# Patient Record
Sex: Female | Born: 1960 | Race: White | Hispanic: No | Smoking: Former smoker
Health system: Southern US, Community
[De-identification: ages and names within clinical notes are randomized; demographics above are authoritative.]

## PROBLEM LIST (undated history)

## (undated) DIAGNOSIS — I10 Essential (primary) hypertension: Secondary | ICD-10-CM

## (undated) DIAGNOSIS — R569 Unspecified convulsions: Secondary | ICD-10-CM

## (undated) DIAGNOSIS — M545 Low back pain, unspecified: Secondary | ICD-10-CM

## (undated) DIAGNOSIS — R51 Headache: Secondary | ICD-10-CM

## (undated) DIAGNOSIS — F419 Anxiety disorder, unspecified: Secondary | ICD-10-CM

## (undated) DIAGNOSIS — R519 Headache, unspecified: Secondary | ICD-10-CM

## (undated) DIAGNOSIS — D649 Anemia, unspecified: Secondary | ICD-10-CM

## (undated) HISTORY — PX: MANDIBLE SURGERY: SHX707

## (undated) HISTORY — DX: Low back pain: M54.5

## (undated) HISTORY — DX: Anxiety disorder, unspecified: F41.9

## (undated) HISTORY — DX: Headache: R51

## (undated) HISTORY — DX: Unspecified convulsions: R56.9

## (undated) HISTORY — PX: BRAIN SURGERY: SHX531

## (undated) HISTORY — DX: Essential (primary) hypertension: I10

## (undated) HISTORY — DX: Headache, unspecified: R51.9

## (undated) HISTORY — DX: Low back pain, unspecified: M54.50

## (undated) HISTORY — DX: Anemia, unspecified: D64.9

## (undated) HISTORY — PX: JOINT REPLACEMENT: SHX530

---

## 2002-04-03 ENCOUNTER — Emergency Department (HOSPITAL_COMMUNITY): Admission: EM | Admit: 2002-04-03 | Discharge: 2002-04-03 | Payer: Self-pay | Admitting: *Deleted

## 2002-04-04 ENCOUNTER — Encounter: Payer: Self-pay | Admitting: Emergency Medicine

## 2002-04-04 ENCOUNTER — Emergency Department (HOSPITAL_COMMUNITY): Admission: EM | Admit: 2002-04-04 | Discharge: 2002-04-04 | Payer: Self-pay | Admitting: Emergency Medicine

## 2003-12-10 ENCOUNTER — Emergency Department (HOSPITAL_COMMUNITY): Admission: EM | Admit: 2003-12-10 | Discharge: 2003-12-10 | Payer: Self-pay | Admitting: Emergency Medicine

## 2004-10-16 ENCOUNTER — Emergency Department: Payer: Self-pay | Admitting: Unknown Physician Specialty

## 2005-03-03 ENCOUNTER — Emergency Department (HOSPITAL_COMMUNITY): Admission: EM | Admit: 2005-03-03 | Discharge: 2005-03-03 | Payer: Self-pay | Admitting: Emergency Medicine

## 2005-09-29 ENCOUNTER — Emergency Department: Payer: Self-pay | Admitting: Emergency Medicine

## 2005-12-08 ENCOUNTER — Emergency Department (HOSPITAL_COMMUNITY): Admission: EM | Admit: 2005-12-08 | Discharge: 2005-12-08 | Payer: Self-pay | Admitting: Emergency Medicine

## 2006-03-26 ENCOUNTER — Emergency Department: Payer: Self-pay | Admitting: Emergency Medicine

## 2006-04-25 ENCOUNTER — Emergency Department: Payer: Self-pay | Admitting: Unknown Physician Specialty

## 2006-06-12 ENCOUNTER — Encounter: Payer: Self-pay | Admitting: Emergency Medicine

## 2006-06-12 ENCOUNTER — Inpatient Hospital Stay (HOSPITAL_COMMUNITY): Admission: AD | Admit: 2006-06-12 | Discharge: 2006-06-22 | Payer: Self-pay | Admitting: Neurosurgery

## 2006-06-22 ENCOUNTER — Inpatient Hospital Stay (HOSPITAL_COMMUNITY)
Admission: RE | Admit: 2006-06-22 | Discharge: 2006-07-04 | Payer: Self-pay | Admitting: Physical Medicine & Rehabilitation

## 2006-06-23 ENCOUNTER — Ambulatory Visit: Payer: Self-pay | Admitting: Physical Medicine & Rehabilitation

## 2006-07-11 ENCOUNTER — Encounter: Payer: Self-pay | Admitting: Physical Medicine & Rehabilitation

## 2006-07-13 ENCOUNTER — Inpatient Hospital Stay (HOSPITAL_COMMUNITY): Admission: RE | Admit: 2006-07-13 | Discharge: 2006-07-16 | Payer: Self-pay | Admitting: Neurosurgery

## 2009-09-08 ENCOUNTER — Ambulatory Visit: Payer: Self-pay | Admitting: Pain Medicine

## 2009-09-14 ENCOUNTER — Ambulatory Visit: Payer: Self-pay | Admitting: Pain Medicine

## 2009-09-21 ENCOUNTER — Emergency Department: Payer: Self-pay | Admitting: Emergency Medicine

## 2009-10-19 ENCOUNTER — Emergency Department: Payer: Self-pay | Admitting: Emergency Medicine

## 2009-10-21 ENCOUNTER — Ambulatory Visit: Payer: Self-pay | Admitting: Pain Medicine

## 2009-11-09 ENCOUNTER — Ambulatory Visit: Payer: Self-pay | Admitting: Unknown Physician Specialty

## 2009-11-26 ENCOUNTER — Ambulatory Visit: Payer: Self-pay | Admitting: Pain Medicine

## 2009-12-07 ENCOUNTER — Ambulatory Visit: Payer: Self-pay | Admitting: Pain Medicine

## 2009-12-30 ENCOUNTER — Ambulatory Visit: Payer: Self-pay | Admitting: Pain Medicine

## 2010-01-06 ENCOUNTER — Ambulatory Visit: Payer: Self-pay | Admitting: Pain Medicine

## 2010-02-02 ENCOUNTER — Ambulatory Visit: Payer: Self-pay | Admitting: Pain Medicine

## 2010-03-03 ENCOUNTER — Ambulatory Visit: Payer: Self-pay | Admitting: Pain Medicine

## 2010-03-31 ENCOUNTER — Ambulatory Visit: Payer: Self-pay | Admitting: Pain Medicine

## 2010-04-22 ENCOUNTER — Ambulatory Visit: Payer: Self-pay | Admitting: Pain Medicine

## 2010-04-28 ENCOUNTER — Ambulatory Visit: Payer: Self-pay | Admitting: Pain Medicine

## 2010-05-27 ENCOUNTER — Ambulatory Visit: Payer: Self-pay | Admitting: Pain Medicine

## 2010-06-24 ENCOUNTER — Ambulatory Visit: Payer: Self-pay | Admitting: Pain Medicine

## 2010-07-16 ENCOUNTER — Ambulatory Visit: Payer: Self-pay | Admitting: Neurosurgery

## 2010-07-21 ENCOUNTER — Ambulatory Visit: Payer: Self-pay | Admitting: Pain Medicine

## 2010-07-28 ENCOUNTER — Ambulatory Visit: Payer: Self-pay | Admitting: Pain Medicine

## 2010-07-30 ENCOUNTER — Ambulatory Visit: Payer: Self-pay | Admitting: Pain Medicine

## 2010-08-24 ENCOUNTER — Ambulatory Visit: Payer: Self-pay | Admitting: Pain Medicine

## 2010-09-13 ENCOUNTER — Ambulatory Visit: Payer: Self-pay | Admitting: Pain Medicine

## 2010-10-12 ENCOUNTER — Ambulatory Visit: Payer: Self-pay | Admitting: Pain Medicine

## 2010-11-09 ENCOUNTER — Ambulatory Visit: Payer: Self-pay | Admitting: Pain Medicine

## 2010-12-09 ENCOUNTER — Ambulatory Visit: Payer: Self-pay | Admitting: Pain Medicine

## 2011-01-06 ENCOUNTER — Ambulatory Visit: Payer: Self-pay | Admitting: Pain Medicine

## 2011-01-10 ENCOUNTER — Ambulatory Visit: Payer: Self-pay | Admitting: Unknown Physician Specialty

## 2011-01-20 ENCOUNTER — Inpatient Hospital Stay: Payer: Self-pay | Admitting: Unknown Physician Specialty

## 2011-01-21 LAB — PATHOLOGY REPORT

## 2011-09-23 ENCOUNTER — Ambulatory Visit: Payer: Self-pay | Admitting: Unknown Physician Specialty

## 2011-10-07 ENCOUNTER — Inpatient Hospital Stay: Payer: Self-pay | Admitting: Unknown Physician Specialty

## 2011-10-08 DIAGNOSIS — I499 Cardiac arrhythmia, unspecified: Secondary | ICD-10-CM

## 2011-10-12 LAB — PATHOLOGY REPORT

## 2012-03-22 ENCOUNTER — Ambulatory Visit: Payer: Self-pay | Admitting: Pain Medicine

## 2012-04-24 ENCOUNTER — Ambulatory Visit: Payer: Self-pay | Admitting: Pain Medicine

## 2012-05-17 ENCOUNTER — Ambulatory Visit: Payer: Self-pay | Admitting: Pain Medicine

## 2012-06-19 ENCOUNTER — Ambulatory Visit: Payer: Self-pay | Admitting: Pain Medicine

## 2012-07-08 ENCOUNTER — Emergency Department: Payer: Self-pay | Admitting: Emergency Medicine

## 2012-07-08 LAB — COMPREHENSIVE METABOLIC PANEL
Albumin: 4.2 g/dL (ref 3.4–5.0)
Alkaline Phosphatase: 113 U/L (ref 50–136)
Calcium, Total: 9.1 mg/dL (ref 8.5–10.1)
Co2: 32 mmol/L (ref 21–32)
EGFR (Non-African Amer.): >60
Glucose: 104 mg/dL — ABNORMAL HIGH (ref 65–99)
Osmolality: 275 (ref 275–301)
SGOT(AST): 32 U/L (ref 15–37)
SGPT (ALT): 29 U/L (ref 12–78)

## 2012-07-08 LAB — CBC
MCH: 31.5 pg (ref 26.0–34.0)
Platelet: 202 10*3/uL (ref 150–440)
RBC: 4.31 10*6/uL (ref 3.80–5.20)
RDW: 12.6 % (ref 11.5–14.5)
WBC: 9.1 10*3/uL (ref 3.6–11.0)

## 2012-07-08 LAB — PROTIME-INR: INR: 1

## 2012-07-08 LAB — MAGNESIUM: Magnesium: 1.6 mg/dL — ABNORMAL LOW

## 2012-09-18 ENCOUNTER — Ambulatory Visit: Payer: Self-pay | Admitting: Pain Medicine

## 2012-10-18 ENCOUNTER — Ambulatory Visit: Payer: Self-pay | Admitting: Pain Medicine

## 2012-11-13 ENCOUNTER — Ambulatory Visit: Payer: Self-pay | Admitting: Pain Medicine

## 2012-12-13 ENCOUNTER — Ambulatory Visit: Payer: Self-pay | Admitting: Pain Medicine

## 2013-01-15 ENCOUNTER — Ambulatory Visit: Payer: Self-pay | Admitting: Pain Medicine

## 2013-02-14 ENCOUNTER — Ambulatory Visit: Payer: Self-pay | Admitting: Pain Medicine

## 2013-03-18 ENCOUNTER — Ambulatory Visit: Payer: Self-pay | Admitting: Pain Medicine

## 2013-04-16 ENCOUNTER — Ambulatory Visit: Payer: Self-pay | Admitting: Pain Medicine

## 2013-05-16 ENCOUNTER — Ambulatory Visit: Payer: Self-pay | Admitting: Pain Medicine

## 2013-06-11 ENCOUNTER — Ambulatory Visit: Payer: Self-pay | Admitting: Pain Medicine

## 2013-07-11 ENCOUNTER — Ambulatory Visit: Payer: Self-pay | Admitting: Pain Medicine

## 2013-08-13 ENCOUNTER — Ambulatory Visit: Payer: Self-pay | Admitting: Pain Medicine

## 2013-09-10 ENCOUNTER — Ambulatory Visit: Payer: Self-pay | Admitting: Pain Medicine

## 2013-10-15 ENCOUNTER — Ambulatory Visit: Payer: Self-pay | Admitting: Pain Medicine

## 2013-11-14 ENCOUNTER — Ambulatory Visit: Payer: Self-pay | Admitting: Pain Medicine

## 2013-12-10 ENCOUNTER — Ambulatory Visit: Payer: Self-pay | Admitting: Pain Medicine

## 2014-01-06 ENCOUNTER — Ambulatory Visit: Payer: Self-pay | Admitting: Pain Medicine

## 2014-02-11 ENCOUNTER — Ambulatory Visit: Payer: Self-pay | Admitting: Pain Medicine

## 2014-03-13 ENCOUNTER — Ambulatory Visit: Payer: Self-pay | Admitting: Pain Medicine

## 2014-04-15 ENCOUNTER — Ambulatory Visit: Payer: Self-pay | Admitting: Pain Medicine

## 2014-05-13 ENCOUNTER — Ambulatory Visit: Payer: Self-pay | Admitting: Pain Medicine

## 2014-06-12 ENCOUNTER — Ambulatory Visit: Payer: Self-pay | Admitting: Pain Medicine

## 2014-07-15 ENCOUNTER — Ambulatory Visit: Payer: Self-pay | Admitting: Pain Medicine

## 2014-08-14 ENCOUNTER — Ambulatory Visit: Payer: Self-pay | Admitting: Pain Medicine

## 2014-09-11 ENCOUNTER — Ambulatory Visit: Payer: Self-pay | Admitting: Pain Medicine

## 2014-10-14 ENCOUNTER — Ambulatory Visit: Payer: Self-pay | Admitting: Pain Medicine

## 2014-11-11 ENCOUNTER — Ambulatory Visit: Payer: Self-pay | Admitting: Pain Medicine

## 2014-12-11 ENCOUNTER — Ambulatory Visit: Payer: Self-pay | Admitting: Pain Medicine

## 2015-01-13 ENCOUNTER — Ambulatory Visit
Admit: 2015-01-13 | Disposition: A | Discharge status: Home / Self Care | Payer: Self-pay | Attending: Pain Medicine | Admitting: Pain Medicine

## 2015-01-27 NOTE — Consult Note (Signed)
Brief Consult Note: Diagnosis: Left periprosthetic hip fracture.   Patient was seen by consultant.   Recommend to proceed with surgery or procedure.   Recommend further assessment or treatment.   Discussed with Attending MD.   Comments: The patient is a 54 y/o female who explains she fell onto her left hip today when she tripped holding bags.  She was unable to break her fall with her hands and landed directly on the left hip.  Patient had pain and was unable to bear weight after the fall.  She had a left THA performed by Dr. Ruthann CancerJames Califf on the left hip in 01/20/11 for AVN/OA.  Patient has severe pain in the left hip with any motion.  On examination of the left hip, she has a well healed posterior incision.  The skin is intact.  There is no erythema or ecchymosis.  There is moderate focal swelling over the lateral hip just below her incision.  The thigh and lower leg compartments are soft and compressible.  The patient has 5/5 strength in all lower leg muscle groups to manual strength testing.  She has intact sensation to light touch throughout the left lower extremity, and palpable pedal pulses.  There is no shortening or abnormal rotation to the left lower extremity on exam.  I reviewed the left femur films taken in the ER.  These films demonstrate a periprosthetic hip fracture Shawna Clamp([place] B) with loosening of the femoral stem.  The hip is located within the acetabulum and there does not appear to be any loosening of the acetabular component or fracture of the pelvis.  I have discussed this case with the ER physician, Dr. Maricela BoLuna Ragsdale, and explained that I recommend the patient be transferred to a medical center facility for revision surgery of the femoral component.  I have explained the injury to the patient and her aunt who is with her in the ER today.  They understand that the [facility] a revision performed by an arthroplasty specialist.  They  understand and agree with this plan and are deciding which medical center the patient wants to go to.  I discussed the case with Dr. Ernest PineHooten as well who agrees with the plan.  Electronic Signatures: Juanell FairlyKrasinski, Davaughn Hillyard (MD)  (Signed 29-Sep-13 21:37)  Authored: Brief Consult Note   Last Updated: 29-Sep-13 21:37 by Juanell FairlyKrasinski, Kale Dols (MD)

## 2015-02-01 NOTE — Discharge Summary (Signed)
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   OPERATION: On 10/07/2011 the patient had a right total hip replacement.   SURGEON: Ruthann CancerJames Califf, M.D.   ASSISTANT: None.   ANESTHESIA: Spinal.   ESTIMATED BLOOD LOSS: 500 mL.  REPLACEMENT: 1200 mL of crystalloid.   DRAINS: Hemovac.   IMPLANTS USED: Stryker Trident PSL 50 mm shell, neutral liner, 3.5 mm Accolate TMZF +127 degree stem, 36 mm +0 head, and 2 cancellus screws.   COMPLICATIONS: None.   SPECIMENS: Femoral head.   HISTORY: The patient is a 54 year old female who presented for evaluation of her right hip. The patient has a history of avascular necrosis and had a left total hip arthroplasty on 01/20/2011. The patient did very well with the left hip. The patient is still having difficulty with the right hip with walking and standing. The patient states it seems to be getting worse. The patient takes MS Contin, oxycodone, Celebrex, and Valium as needed for pain and spasms. The patient has had difficulty with ambulating with aids. The patient has used a cane for ambulation, which has been somewhat difficult.   HOSPITAL COURSE: After initial admission on 10/07/2011, the patient was brought to the orthopedic floor. The patient on postoperative day one had a hemoglobin of 9.2 which dropped down to around 8.2 on 10/09/2011 and remained stable there without dropping further. The patient did have tachycardia on postoperative day one and had a Medical consult with an echocardiogram and EKG show tachycardia. This was secondary to anemia and pain management. The patient was stable and ambulated with physical therapy, initially bed to chair, and progressed up to ambulating 120 feet. She  was ready to go home with home health physical therapy on 10/12/2011.   CONDITION AT DISCHARGE: Stable.   DISPOSITION: The patient was sent home with home health physical therapy.   DISCHARGE INSTRUCTIONS:  1. The [facility] about a week to 10 days for x-ray and possible staple removal.  2. The patient is to elevate her leg for swelling and is weight-bear as tolerated. The patient will use knee-high TED hose on both legs to be removed at bedtime.  3. The patient's diet is regular.  4. The patient will leave the dressing on, but she is able to shower and will try not to get water under her dressing.  5. The patient will call the clinic for any bright red bleeding, calf pain, bowel or bladder difficulty, or fever greater than 101.5.  6. The patient will do physical therapy with weight bear as tolerated with ambulation.   DISCHARGE MEDICATIONS: (Resume home medications including the following) 1. Lovenox 40 mg subcutaneous once a day x14 days. 2. Multivitamin once a day. 3. Celebrex 200 mg p.o. twice a day.          4. Percocet 1 to 2 tablets every four hours as needed for pain. 5. MS Contin 15 mg 2 to 3 tablets every 8 hours for more severe pain. ____________________________ Shela CommonsJ. Dedra Skeensodd Nare Gaspari, [place] jtm:slb D: 10/12/2011 07:44:00 ET T: 10/13/2011 14:43:16 ET JOB#: [id number]  cc: J. Dedra Skeensodd Levette Paulick, [place], <Dictator> J Sydna Brodowski [facility] Center[facility] PA ELECTRONICALLY SIGNED 10/14/2011 7:57

## 2015-02-01 NOTE — Consult Note (Signed)
PATIENT NAME:  Margaret Shepard, Margaret Shepard MR#:  [id number] DATE OF BIRTH:  04/17/1961  DATE OF CONSULTATION:  10/08/2011  REQUESTING PHYSICIAN:  Ruthann Cancer, MD CONSULTING PHYSICIAN:  Elon Alas, MD PRIMARY CARE PHYSICIAN:  Dr Jorene Guest  REASON FOR CONSULTATION: Tachycardia and abnormal EKG.    HISTORY OF PRESENT ILLNESS:  Patient is a 54 year old female with past medical history listed below including depression, anxiety, seizure disorder, hyperlipidemia, and former tobacco abuser who was admitted to the orthopedics service on 10/07/2011 to undergo a right total hip arthroplasty for AVN. She is currently postoperative day #1.  She was noted to have an elevated heart rate today, and EKG was obtained revealing sinus tachycardia and some T wave inversions inferiorly and also in V5 and V6 for which medical consultation was requested. The patient currently reports 10 out of 10 right hip pain and also some lower back pain. Otherwise, she is without specific complaints at this time. She is currently on a morphine PCA. She is also on p.r.n. IV morphine in addition to scheduled MS Contin and p.r.n. oxycodone. She currently denies any chest pain, shortness of breath, or heart palpitations. Other than pain she has been without any specific complaints at this time. Current heart rate is 111 beats per minute. Heart rate was as high as 126 earlier today. EKG was abnormal with findings as above. She was also noted to be anemic with hemoglobin 9.2 from this morning and outpatient labs revealed normal hemoglobin and hematocrit from 09/23/2011. Otherwise except as above, patient is without significant complaints, and hospitalist services was consulted for evaluation of elevated heart rate and abnormal EKG.     PAST SURGICAL HISTORY:  1. Brain surgery.   2. Jaw surgery.   3. Left total hip arthroplasty 01/20/2011.    4. Right total hip arthroplasty 09/11/2011.   PAST MEDICAL HISTORY:  1. Hyperlipidemia, diet  controlled.   2. Depression.   3. Anxiety disorder.   4. Seizure disorder.   5. Former tobacco abuse.  6. Chronic back pain.   ALLERGIES: Vicodin (hydrocodone/acetaminophen) with unspecified reaction.   HOME MEDICATIONS:    1. Diazepam 10 mg p.o. t.i.d. p.r.n.  2. Celebrex 200 mg b.i.d.  3. Gabapentin 300 mg p.o. b.i.d.  4. Lexapro 20 mg daily.  5. Magnesium 250 mg 1 tablet p.o. 2 to 3 times per week.  6. MS Contin 30 mg p.o. t.i.d. p.r.n.  7. Trazodone 100 mg p.o. at bedtime.  8. Oxycodone/acetaminophen 10/325 one tablet p.o. q.i.d. p.r.n.   CURRENT INPATIENT MEDICATIONS:  1. Morphine PCA.  2. D5 normal saline at 100 mL per hour.  3. Tylenol Extra Strength p.r.n.  4. Antacid double-strength suspension p.r.n.  5. Dulcolax p.r.n.  6. Valium 10 mg p.o. q.8 hours.  7. Senna-S 1 tablet p.o. b.i.d.  8. Anzemet 12.5 mg IV q.6 hours p.r.n.  9. Lovenox 30 mg subcutaneously q.12 hours.   10. Lexapro 20 mg daily.   11. Gabapentin 300 mg p.o. b.i.d.  12. Milk of magnesia 30 mL p.o. b.i.d. p.r.n.  13. Morphine 1 to 2 mg IV q.1 hour p.r.n.  14. MS Contin 30 mg p.o. q.8 hours.  15. Oxycodone 5 to 10 mg p.o. q.4 hours p.r.n.  16. Trazodone 100 mg p.o. at bedtime.  17. Ferocon 1 capsule p.o. b.i.d.   FAMILY HISTORY: Mother with history of chronic obstructive pulmonary disease.     SOCIAL HISTORY: Tobacco - former smoker, none currently.  Alcohol - occasional. Illicit drugs -  none. Patient is divorced.     PHYSICAL EXAMINATION:  VITAL SIGNS: Temperature 100. Overnight she also had a temperature of 100.3 around 12:50 a.m. Current pulse is 111, blood pressure is 110/71, respirations 18, O2 saturations 97% to 98% on 2L nasal cannula.   GENERAL: The patient alert and oriented, in mild distress secondary to pain.    HEENT: Normocephalic, atraumatic. Pupils are equal, round and reactive to light accommodation. Extraocular muscles are intact. Anicteric sclerae. Conjunctivae pink. Hearing  intact to voice. Nares without drainage. Oral mucosa moist without lesions.    NECK: Supple with full range of motion. No JVD lymphadenopathy, or carotid bruits bilaterally. No thyromegaly, tenderness to palpation of the thyroid gland.   CHEST:  Normal respiratory effort without use of accessory muscles.   LUNGS: Clear to auscultation bilaterally without crackles, rales, or wheezes.   CARDIOVASCULAR: S1, S2 positive, tachycardic.  There is a 2/6 systolic murmur at the right upper sternal border. No rubs or gallops. PMI is non-lateralized.   ABDOMEN: Soft, nontender, nondistended. Normoactive bowel sounds. No hepatosplenomegaly or palpable masses. No hernias.   EXTREMITIES: No clubbing, cyanosis, or edema. Pedal pulses are palpable bilaterally.   SKIN: No suspicious rashes. Skin turgor is good.   LYMPH: No cervical lymphadenopathy.   NEUROLOGIC:  Alert and oriented x2.  Cranial nerves II-XII grossly intact. No focal deficits.   PSYCHIATRIC: Pleasant female with appropriate affect.   LABS/STUDIES: BMP: Sodium 141, potassium 3.5, chloride 103, bicarbonate 28, BUN 5, creatinine 0.6, glucose 145, calcium 7.8. Hemoglobin is 9.2, platelets 163,000.   EKG:  Sinus tachycardia 112 beats per minute with normal axis, normal intervals.  The patient has new T wave inversions in inferior leads, V3 and aVF as well as new T wave inversions in V5 and V6 compared with EKG from 01/10/2011. No ST elevations noted, mild ST depression in lead V6 which is a nonspecific finding.   ASSESSMENT AND PLAN: A 54 year old female with past medical history as above who is here to undergo right total hip arthroplasty for AVN and degenerative arthritis with:  1. Sinus tachycardia, suspect multifactorial from hip pain and anemia. We will keep patient on off- unit telemetry for now. We will continue pain control as you are doing. Also continue to obtain serial hemoglobin and hematocrit checks and transfuse blood as needed. We  will consider adding low-dose beta blocker as well for persistent tachycardia. Obtain cardiology consultation if tachycardia persists despite pain control and management of her anemia. We will also obtain echocardiogram given her cardiac murmur. Please see below.  2. Abnormal electrocardiogram - with new T wave inversions inferiorly in V5 and V6. These are new compared with 01/10/2011 EKG. This could be cardiac strain pattern from anemia and we will need to monitor hemoglobin and hematocrit closely and transfuse as needed as above. The patient's only known risk factors for coronary artery disease include hyperlipidemia and former tobacco abuse. She denies any active chest pain or shortness of breath. She can likely be referred to Cardiology upon discharge for outpatient Myoview and for further evaluation. We will hold off on aspirin for now given anemia and increased risk of bleeding. We will continue to cycle the patient's cardiac enzymes and also obtain echocardiogram as below.  3. Cardiac murmur - could be functional from anemia. We will obtain 2D echocardiogram for further assessment.   4. Postoperative fever - the patient denies any shortness of breath or cough, therefore doubt pneumonia. This could represent a urinary tract infection,  and we will obtain a urinalysis. This could also represent atelectasis, and I have encouraged the patient to use her incentive spirometer frequently in the postoperative setting. We will continue to monitor her temperature.  5. Anemia - suspect operative blood loss (outpatient hemoglobin and hematocrit were normal from 09/23/2011). The patient also noted a bloody drainage, which appears to be leaking around the Hemovac tube.  Orthopedics team is currently managing this. We will continue serial hemoglobin and hematocrit checks and transfuse as needed.  6. Hyperlipidemia - currently diet controlled. Check fasting lipid profile in the a.m.  7. Depression - continue Lexapro.    8. Anxiety -  continue Valium.   9. Seizure disorder - continue Valium and Neurontin and seizure precautions.   10. Degenerative arthritis and AVN, status post right total hip arthroplasty, currently postoperative day #1 - being managed by the orthopedic service.  11. Deep venous thrombosis prophylaxis, Lovenox per Orthopedics.    CODE STATUS: Full code.   Thank you for this consultation and for allowing me to participate in Ms. Pritt's care.  We will continue to follow.  TIME SPENT ON THIS CONSULTATION:  Approximately 50 minutes.     ____________________________ Elon Alas, MD knl:vtd D: 10/08/2011 09:56:00 ET T: 10/08/2011 12:05:22 ET JOB#: [id number]  cc: Winn Jock. Gerrit Heck, MD Elon Alas, MD, <Dictator>  Dr Jorene Guest  [address] Dayle Mcnerney MD ELECTRONICALLY SIGNED 10/13/2011 16:25

## 2015-02-01 NOTE — Op Note (Signed)
PATIENT NAME:  Margaret SkeensRILLAMAN, Margo A MR#:  [id number] DATE OF BIRTH:  Mar 06, 1961  DATE OF PROCEDURE:  10/07/2011  PREOPERATIVE DIAGNOSIS: Degenerative arthritis, right hip, with avascular necrosis.   POSTOPERATIVE DIAGNOSIS: Degenerative arthritis, right hip, with avascular necrosis.   PROCEDURE PERFORMED:  Right total hip replacement.   SURGEON: Winn JockJames C. Phillippa Straub, MD   ASSISTANT: None.   ANESTHESIA: Spinal.   ESTIMATED BLOOD LOSS: 500 mL.  REPLACEMENT: 1200 mL crystalloid.   DRAINS: One Hemovac.   COMPLICATIONS: None.   IMPLANTS USED: Stryker Trident PSL 50 mm shell, neutral liner, 3.5 mm Accolade TMZF +127 degree stem, 36 mm +0 head, 2 cancellus screws.   COMPLICATIONS: None.   SPECIMEN: Femoral head.   BRIEF CLINICAL NOTE AND PATHOLOGY: The patient had persistent pain in her hip, had had a left total hip replacement and had done very well. She had documented avascular necrosis. The options, risks, and benefits were discussed. She was measured to be approximately 1 cm short on the involved side. She understood, and discussion was held just before surgery about the attempts to equalize her leg length and these may not be successful. At the time of the procedure, there was severe avascular necrosis with collapse of the femoral head. The patient's bone overall was of fair quality. The hip was measured to have been lengthened approximately 11 mm.  DESCRIPTION OF PROCEDURE: Preop antibiotics, adequate general anesthesia, left lateral decubitus position, the hip positioner was used. Routine prepping and draping. Appropriate timeout was called.   Routine posterior approach was made. The fascia was opened in line with the incision. The sciatic nerve was identified and protected, and a Charnley retractor was placed. The piriformis was tagged and reflected.   Leg length was measured by placing two Steinmann pins in the pelvis parallel in line between the anterior/superior iliac spine and  sciatic notch. A drill bit was placed in the greater trochanter. The position and leg length on the table was marked. The leg length was measured, and the hip was then dislocated.   The piriformis area was cleared of soft tissue. The neck cut was made using the neck cutting guide. The incision was thoroughly irrigated throughout the procedure.   The calcar entering device was used. Tapered reaming was performed and progressed. The broaches were used and progressed up to 3.5 mm, calcar planing was performed. The incision was thoroughly irrigated.   The acetabulum was inspected. Orientation was noted, reaming was progressed up to 50 mm. This provided good bleeding bone. The incision was thoroughly irrigated. Sizing was performed, the implant was then inserted. It seated very nicely and was extremely secure. Two cancellus screws were placed for additional stability.   The liner was placed. A trial head and neck were used, the hip was taken through range of motion with excellent stability.   The actual femoral implant was then placed. This was after the canal was thoroughly irrigated. The head and neck trials were used, and the +0 neck was felt to give excellent lengthening and excellent range of motion with no evidence of instability. The actual hip was reduced with the range of motion being excellent, no evidence of instability with extension and external rotation or flexion, adduction and internal rotation.   The incision was thoroughly irrigated, hemostasis was good. The Charnley retractor was used. The sciatic nerve was without any obvious damage. The pneumatic drain was placed. The fascia was closed with #2 quill. The subcutaneous was closed with 0 Vicryl and 0 quill.  Soft sterile dressing was applied after staples were used in the skin. Sponge and needle counts were reported as correct prior to and after wound closure.  The patient was awakened and taken to the postanesthesia care unit having  tolerated the procedure well. Leg lengths appeared equal in the recovery room. Postoperative x-rays showed good positioning.    ____________________________ Winn Jock. Gerrit Heck, MD jcc:cbb D: 10/07/2011 15:02:01 ET T: 10/07/2011 18:34:46 ET JOB#: [id number]  cc: Winn Jock. Gerrit Heck, MD, <Dictator> Winn Jock Kalisi Bevill MD ELECTRONICALLY SIGNED 10/27/2011 16:04

## 2015-02-12 ENCOUNTER — Encounter: Payer: Medicaid Other | Admitting: Pain Medicine

## 2015-03-09 ENCOUNTER — Other Ambulatory Visit: Payer: Self-pay | Admitting: Pain Medicine

## 2015-03-09 DIAGNOSIS — M5481 Occipital neuralgia: Secondary | ICD-10-CM | POA: Insufficient documentation

## 2015-03-09 DIAGNOSIS — Z981 Arthrodesis status: Secondary | ICD-10-CM | POA: Insufficient documentation

## 2015-03-09 DIAGNOSIS — M5030 Other cervical disc degeneration, unspecified cervical region: Secondary | ICD-10-CM | POA: Insufficient documentation

## 2015-03-09 DIAGNOSIS — Z96642 Presence of left artificial hip joint: Secondary | ICD-10-CM

## 2015-03-09 DIAGNOSIS — M5136 Other intervertebral disc degeneration, lumbar region: Secondary | ICD-10-CM

## 2015-03-09 DIAGNOSIS — M533 Sacrococcygeal disorders, not elsewhere classified: Secondary | ICD-10-CM | POA: Insufficient documentation

## 2015-03-12 ENCOUNTER — Encounter: Payer: Self-pay | Admitting: Pain Medicine

## 2015-03-12 ENCOUNTER — Ambulatory Visit: Payer: Medicaid Other | Attending: Pain Medicine | Admitting: Pain Medicine

## 2015-03-12 VITALS — BP 141/83 | HR 105 | Temp 97.9°F | Resp 18 | Ht 65.0 in | Wt 138.0 lb

## 2015-03-12 DIAGNOSIS — Z96649 Presence of unspecified artificial hip joint: Secondary | ICD-10-CM | POA: Insufficient documentation

## 2015-03-12 DIAGNOSIS — M5136 Other intervertebral disc degeneration, lumbar region: Secondary | ICD-10-CM | POA: Diagnosis not present

## 2015-03-12 DIAGNOSIS — Z96642 Presence of left artificial hip joint: Secondary | ICD-10-CM

## 2015-03-12 DIAGNOSIS — R51 Headache: Secondary | ICD-10-CM | POA: Diagnosis present

## 2015-03-12 DIAGNOSIS — M5481 Occipital neuralgia: Secondary | ICD-10-CM | POA: Diagnosis not present

## 2015-03-12 DIAGNOSIS — Z9889 Other specified postprocedural states: Secondary | ICD-10-CM | POA: Diagnosis not present

## 2015-03-12 DIAGNOSIS — M533 Sacrococcygeal disorders, not elsewhere classified: Secondary | ICD-10-CM | POA: Insufficient documentation

## 2015-03-12 DIAGNOSIS — M5030 Other cervical disc degeneration, unspecified cervical region: Secondary | ICD-10-CM | POA: Diagnosis not present

## 2015-03-12 DIAGNOSIS — Z981 Arthrodesis status: Secondary | ICD-10-CM

## 2015-03-12 DIAGNOSIS — M542 Cervicalgia: Secondary | ICD-10-CM | POA: Diagnosis present

## 2015-03-12 MED ORDER — DIAZEPAM 5 MG PO TABS: ORAL_TABLET | ORAL | Status: DC

## 2015-03-12 MED ORDER — OXYCODONE HCL 10 MG PO TABS: ORAL_TABLET | ORAL | Status: DC

## 2015-03-12 MED ORDER — OXYCODONE HCL ER 30 MG PO T12A: EXTENDED_RELEASE_TABLET | ORAL | Status: DC

## 2015-03-12 MED ORDER — CYCLOBENZAPRINE HCL 5 MG PO TABS: ORAL_TABLET | ORAL | Status: DC

## 2015-03-12 MED ORDER — GABAPENTIN 300 MG PO CAPS: ORAL_CAPSULE | ORAL | Status: DC

## 2015-03-12 NOTE — Progress Notes (Signed)
Safety precautions to be maintained throughout the outpatient stay will include: orient to surroundings, keep bed in low position, maintain call bell within reach at all times, provide assistance with transfer out of bed and ambulation.  Discharged at 1340, ambulatory.

## 2015-03-12 NOTE — Patient Instructions (Signed)
Continue present medications.  F/U PCP for evaliation of  BP and general medical  condition. Blood pressure is elevated please see your primary care physician today or later this week for evaluation of blood pressure and general medical condition  F/U surgical evaluation.  F/U neurological evaluation.  May consider radiofrequency rhizolysis or intraspinal procedures pending response to present treatment and F/U evaluation.  Patient to call Pain Management Center should patient have concerns prior to scheduled return appointment.

## 2015-03-12 NOTE — Progress Notes (Signed)
   Subjective:    Patient ID: Margaret Shepard, female    DOB: 04/18/1961, 54 y.o.   MRN: [id number]  HPI  Patient is 54 year old female who comes to pain management Center for follow-up evaluation and treatment of pain as well as the cervical and upper extremity region and headaches. At the present time patient states that the pain is fairly well-controlled. Patient denies trauma or change in events of daily living the call significant change in symptomatology. Scars patient's blood pressure was noted to be elevated. Patient admits to eating salt and deviating from recommended diet. Patient will undergo follow-up evaluation with primary care physician for evaluation of elevated blood pressure as discussed and we will continue medications as prescribed. The patient was understanding and agreement with suggested treatment plan. the lower back and lower extremity region   Review of Systems     Objective:   Physical Exam  Tennis to palpation of the splenius capitis and occipitalis region of mild degree with mild tinnitus of the acromioclavicular glenohumeral joint region. No new lesions of the head and neck were noted. There was tends to palpation of the cervical and thoracic spinal muscles and set regions. Of moderate degree. Palpation over the lower thoracic region was with increased pain and muscle spasms noted of moderate to moderately severe degree. With no crepitus of the thoracic region haven't been noted. Nurses to palpation over the lumbar paraspinal muscles and lumbar facet region of moderate moderately severe degree. Palpation of the PSIS and PII S region reproduced severe pain. Palpation of the greater trochanteric region associated moderate discomfort. Straight leg raising tolerates approximately 20 without increase of pain with dorsiflexion noted. No definite sensory deficit of dermatomal distribution detected. Abdomen soft nontender with no costovertebral angle tenderness noted.       Assessment & Plan:   Degenerative disc disease lumbar spine  Lumbar facet syndrome  Sacroiliac joint dysfunction  Status post total hip replacement  Degenerative disc disease cervical spine  Status post surgery of cervical region  Cervical facet syndrome  Bilateral greater occipital neuralgia     Plan  Continue present medications.  F/U PCP for evaliation of  BP and general medical  condition. Blood pressure elevated on today's visit. Patient was advised to follow primary care physician immediately or go to the emergency room for evaluation and treatment of elevated blood pressure. Patient was in agreement with suggested treatment plan and states she will follow-up with primary care physician immediately webers and treatment of elevated blood pressure. Patient admitted to deviating from diet and having significant salt intake  F/U surgical evaluation.  F/U neurological evaluation.  May consider radiofrequency rhizolysis or intraspinal procedures pending response to present treatment and F/U evaluation.  Patient to call Pain Management Center should patient have concerns prior to scheduled return appointment.

## 2015-03-17 ENCOUNTER — Encounter: Payer: Medicaid Other | Admitting: Pain Medicine

## 2015-03-26 ENCOUNTER — Other Ambulatory Visit: Payer: Self-pay | Admitting: Pain Medicine

## 2015-04-09 ENCOUNTER — Ambulatory Visit: Payer: Medicaid Other | Attending: Pain Medicine | Admitting: Pain Medicine

## 2015-04-09 ENCOUNTER — Encounter: Payer: Self-pay | Admitting: Pain Medicine

## 2015-04-09 VITALS — BP 131/85 | HR 91 | Temp 98.6°F | Resp 18 | Ht 65.0 in | Wt 137.0 lb

## 2015-04-09 DIAGNOSIS — M5136 Other intervertebral disc degeneration, lumbar region: Secondary | ICD-10-CM

## 2015-04-09 DIAGNOSIS — M5030 Other cervical disc degeneration, unspecified cervical region: Secondary | ICD-10-CM

## 2015-04-09 DIAGNOSIS — M5481 Occipital neuralgia: Secondary | ICD-10-CM

## 2015-04-09 DIAGNOSIS — Z981 Arthrodesis status: Secondary | ICD-10-CM

## 2015-04-09 DIAGNOSIS — M533 Sacrococcygeal disorders, not elsewhere classified: Secondary | ICD-10-CM

## 2015-04-09 DIAGNOSIS — Z96642 Presence of left artificial hip joint: Secondary | ICD-10-CM

## 2015-04-09 MED ORDER — DIAZEPAM 5 MG PO TABS: ORAL_TABLET | ORAL | Status: DC

## 2015-04-09 MED ORDER — OXYCODONE HCL ER 30 MG PO T12A: EXTENDED_RELEASE_TABLET | ORAL | Status: DC

## 2015-04-09 MED ORDER — OXYCODONE HCL 10 MG PO TABS: ORAL_TABLET | ORAL | Status: DC

## 2015-04-09 NOTE — Patient Instructions (Addendum)
Continue present medications Neurontin Flexeril Valium oxycodone and OxyContin  F/U PCP for evaliation of  BP and general medical  condition.  F/U surgical evaluation  F/U neurological evaluation  May consider radiofrequency rhizolysis or intraspinal procedures pending response to present treatment and F/U evaluation.  Patient to call Pain Management Center should patient have concerns prior to scheduled return appointment.  You were given scripts for Oxycodone  , Oxycodone 30 mg, and Valium today.

## 2015-04-09 NOTE — Progress Notes (Signed)
   Subjective:    Patient ID: Margaret Shepard, female    DOB: 04/09/1961, 54 y.o.   MRN: [id number]  HPI  Patient 54 year old female returns to pain management Center for further evaluation and treatment of pain involving the neck headaches as well as entire back upper and lower extremity regions. Patient denies any significant headaches at this time. Patient states that from pain involves the lower back lower extremity region. Patient's pain is aggravated by standing walking twisting turning maneuvers. Patient denies any trauma change in events of daily living the call significant change in symptomatology. We will continue presently prescribed medications which have been of significant benefit in terms of reducing patient's pain and allow patient to perform activities of daily living to a significant degree without severe disabling pain. Patient appears to be quite pleased with her level of pain controlled at this time. We will continue present medications and will consider modification of treatment pending follow-up evaluation. The patient is understanding and agreement status treatment plan.    Review of Systems     Objective:   Physical Exam there was tennis over the splenius capitis of talus region of mild degree. Palpation over the cervical facet cervical paraspinal musculature region reproduced mild discomfort. Palpation of the thoracic facet thoracic paraspinal muscles region associated with mild discomfort. There appeared to be unremarkable Spurling's maneuver. Patient appeared to be with decreased grip strength. There was negative clonus negative Homans. No crepitus of the thoracic region was noted. Palpation over the lumbar facet lumbar paraspinal musculature region was a tends to palpation of moderate degree. Extension and palpation of the lumbar facets reproduce moderate discomfort. Straight leg raising limited to approximately 20 decreased EHL strength was noted. No sensory deficit of  dermatomal distribution detected. Negative clonus negative Homans. Abdomen nontender no costovertebral tenderness noted.      Assessment & Plan:  Degenerative disc disease cervical spine Degenerative changes C5 degenerative changes especially with degenerative changes noted at C5-6 level as well  Degenerative disc disease lumbar spine Generative changes lumbar spine L1-L2 to 3 L3-4 and L4-L5 with bilateral facet arthropathy, left paracentral disc protrusion  Lumbar facet syndrome  Status post total hip replacements  Sacroiliac joint dysfunction   Plan  Continue present medications Neurontin Valium and Flexeril oxycodone and OxyContin  F/U PCP for evaliation of  BP and general medical  condition.  F/U surgical evaluation  F/U neurological evaluation  May consider radiofrequency rhizolysis or intraspinal procedures pending response to present treatment and F/U evaluation.  Patient to call [facility] should patient have concerns prior to scheduled return appointment.

## 2015-04-09 NOTE — Progress Notes (Signed)
Safety precautions to be maintained throughout the outpatient stay will include: orient to surroundings, keep bed in low position, maintain call bell within reach at all times, provide assistance with transfer out of bed and ambulation.  

## 2015-05-12 ENCOUNTER — Ambulatory Visit: Payer: Medicaid Other | Attending: Pain Medicine | Admitting: Pain Medicine

## 2015-05-12 ENCOUNTER — Encounter: Payer: Self-pay | Admitting: Pain Medicine

## 2015-05-12 VITALS — BP 161/95 | HR 110 | Temp 98.3°F | Resp 16 | Ht 65.0 in | Wt 138.0 lb

## 2015-05-12 DIAGNOSIS — M5030 Other cervical disc degeneration, unspecified cervical region: Secondary | ICD-10-CM | POA: Insufficient documentation

## 2015-05-12 DIAGNOSIS — M533 Sacrococcygeal disorders, not elsewhere classified: Secondary | ICD-10-CM | POA: Diagnosis not present

## 2015-05-12 DIAGNOSIS — Z96649 Presence of unspecified artificial hip joint: Secondary | ICD-10-CM | POA: Insufficient documentation

## 2015-05-12 DIAGNOSIS — M47816 Spondylosis without myelopathy or radiculopathy, lumbar region: Secondary | ICD-10-CM | POA: Insufficient documentation

## 2015-05-12 DIAGNOSIS — M546 Pain in thoracic spine: Secondary | ICD-10-CM | POA: Diagnosis present

## 2015-05-12 DIAGNOSIS — M5136 Other intervertebral disc degeneration, lumbar region: Secondary | ICD-10-CM | POA: Diagnosis not present

## 2015-05-12 DIAGNOSIS — M542 Cervicalgia: Secondary | ICD-10-CM | POA: Diagnosis not present

## 2015-05-12 DIAGNOSIS — M4806 Spinal stenosis, lumbar region: Secondary | ICD-10-CM | POA: Insufficient documentation

## 2015-05-12 DIAGNOSIS — M47812 Spondylosis without myelopathy or radiculopathy, cervical region: Secondary | ICD-10-CM | POA: Diagnosis not present

## 2015-05-12 DIAGNOSIS — M5481 Occipital neuralgia: Secondary | ICD-10-CM | POA: Insufficient documentation

## 2015-05-12 DIAGNOSIS — M62830 Muscle spasm of back: Secondary | ICD-10-CM | POA: Insufficient documentation

## 2015-05-12 DIAGNOSIS — G588 Other specified mononeuropathies: Secondary | ICD-10-CM | POA: Insufficient documentation

## 2015-05-12 DIAGNOSIS — Z96642 Presence of left artificial hip joint: Secondary | ICD-10-CM

## 2015-05-12 DIAGNOSIS — Z981 Arthrodesis status: Secondary | ICD-10-CM

## 2015-05-12 MED ORDER — OXYCODONE HCL 10 MG PO TABS: ORAL_TABLET | ORAL | Status: DC

## 2015-05-12 MED ORDER — DIAZEPAM 5 MG PO TABS: ORAL_TABLET | ORAL | Status: DC

## 2015-05-12 MED ORDER — OXYCODONE HCL ER 30 MG PO T12A: EXTENDED_RELEASE_TABLET | ORAL | Status: DC

## 2015-05-12 NOTE — Patient Instructions (Addendum)
Continue present medication Neurontin Flexeril Valium  oxycodone and OxyContin  F/U PCP Dr. Maryellen Pile for evaliation of  BP and general medical  condition. Please see Dr. Maryellen Pile this week for evaluation of your elevated blood pressure as discussed  F/U surgical evaluation  F/U neurological evaluation  May consider radiofrequency rhizolysis or intraspinal procedures pending response to present treatment and F/U evaluation   Patient to call [facility] Management Center should patient have concerns prior to scheduled return appointmen.

## 2015-05-12 NOTE — Progress Notes (Signed)
   Subjective:    Patient ID: Margaret Shepard, female    DOB: [birth year], 54 y.o.   MRN: [id number]  HPI  Patient is 54 year old female returns to [facility] for further evaluation and treatment of pain involving the region of the neck and entire back upper and lower extremity regions. Patient states she recently fell she had trauma to the left thoracic region and lower back lower extremity region. Patient denies of fracture of ribs and states that she is able to breathe deeply at this time patient states that she was without any direct injury to the lower extremities. We will encourage patient to continue to breathe deeply and to call Pain Management Center should there be any increased pain thoracic region lumbar region on lower extremity regions or other regions. We will also discussed blood pressure was noted to be elevated and patient will undergo evaluation with Dr. Maryellen Pile for evaluation of elevated blood pressure on today's visit. We'll continue medications as prescribed. The patient was understanding and agreement status treatment plan.    Review of Systems     Objective:   Physical Exam  There was mild tenderness of the splenius capitis and occipitalis musculature region. There was tends to palpation over the region of the cervical facet cervical paraspinal musculature region as well as the thoracic facet thoracic paraspinal musculature region. There was unremarkable Spurling's maneuver patient was with decreased grip strength on the left compared to the right palpation over the thoracic facet thoracic paraspinal musculature region was a tends to palpation of the left thoracic paraspinal musculature region without evidence of crepitus there was significant is to palpation of the mid thoracic region on the left without crepitus of the thoracic region noted. Palpation of the lumbar paraspinal muscles region lumbar facet region associated with increase tinged palpation with lateral  bending and rotation and extension to palpation of the lumbar facets reproducing moderately severe discomfort. Straight leg raising limited to approximately 20 without increase of pain with dorsiflexion noted. There was negative clonus negative Homans. Abdomen nontender with no costovertebral angle tenderness noted. There was mild to moderate tenderness of the greater trochanteric region iliotibial band region. EHL strength was decreased there was negative clonus negative Homans. Abdomen was nontender and no costovertebral angle tenderness was noted.    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 Maryellen Pile for evaliation of elevated BP and general medical  condition as discussed.  F/U surgical evaluation  F/U neurological evaluation  F/U psych evaluation  Radiographic studies of ribs to be considered. Patient referred to avoid studies of ribs on today's visit status post trauma to the thoracic region. Patient is been encouraged to the deeply and to call pain management as well as Dr. Florina Ou be changing condition.to scheduled return appointment  May consider radiofrequency rhizolysis or intraspinal procedures pending response to present treatment and F/U evaluation.  Patient to call [facility] Management Center should patient have concerns prior to scheduled return appointment.

## 2015-05-25 ENCOUNTER — Other Ambulatory Visit: Payer: Self-pay | Admitting: Pain Medicine

## 2015-06-11 ENCOUNTER — Ambulatory Visit: Payer: Medicaid Other | Attending: Pain Medicine | Admitting: Pain Medicine

## 2015-06-11 ENCOUNTER — Encounter: Payer: Self-pay | Admitting: Pain Medicine

## 2015-06-11 VITALS — BP 141/98 | HR 106 | Temp 98.1°F | Resp 18 | Ht 65.5 in | Wt 138.0 lb

## 2015-06-11 DIAGNOSIS — M5481 Occipital neuralgia: Secondary | ICD-10-CM | POA: Insufficient documentation

## 2015-06-11 DIAGNOSIS — M5126 Other intervertebral disc displacement, lumbar region: Secondary | ICD-10-CM | POA: Diagnosis not present

## 2015-06-11 DIAGNOSIS — M5136 Other intervertebral disc degeneration, lumbar region: Secondary | ICD-10-CM | POA: Diagnosis not present

## 2015-06-11 DIAGNOSIS — G588 Other specified mononeuropathies: Secondary | ICD-10-CM | POA: Diagnosis not present

## 2015-06-11 DIAGNOSIS — M533 Sacrococcygeal disorders, not elsewhere classified: Secondary | ICD-10-CM | POA: Insufficient documentation

## 2015-06-11 DIAGNOSIS — Z96642 Presence of left artificial hip joint: Secondary | ICD-10-CM

## 2015-06-11 DIAGNOSIS — R51 Headache: Secondary | ICD-10-CM | POA: Diagnosis present

## 2015-06-11 DIAGNOSIS — M47812 Spondylosis without myelopathy or radiculopathy, cervical region: Secondary | ICD-10-CM | POA: Insufficient documentation

## 2015-06-11 DIAGNOSIS — M5030 Other cervical disc degeneration, unspecified cervical region: Secondary | ICD-10-CM | POA: Insufficient documentation

## 2015-06-11 DIAGNOSIS — M62830 Muscle spasm of back: Secondary | ICD-10-CM | POA: Insufficient documentation

## 2015-06-11 DIAGNOSIS — M47816 Spondylosis without myelopathy or radiculopathy, lumbar region: Secondary | ICD-10-CM | POA: Insufficient documentation

## 2015-06-11 DIAGNOSIS — Z96643 Presence of artificial hip joint, bilateral: Secondary | ICD-10-CM | POA: Insufficient documentation

## 2015-06-11 DIAGNOSIS — Z981 Arthrodesis status: Secondary | ICD-10-CM

## 2015-06-11 MED ORDER — DIAZEPAM 5 MG PO TABS: ORAL_TABLET | ORAL | Status: DC

## 2015-06-11 MED ORDER — OXYCODONE HCL ER 30 MG PO T12A: EXTENDED_RELEASE_TABLET | ORAL | Status: DC

## 2015-06-11 MED ORDER — OXYCODONE HCL 10 MG PO TABS: ORAL_TABLET | ORAL | Status: DC

## 2015-06-11 NOTE — Patient Instructions (Addendum)
Continue present medications  Neurontin Flexeril Valium oxycodone and OxyContin  F/U PCP Dr. Jorene Guest for evaliation of  BP and general medical condition . Your blood pressure is elevated. Please follow-up with primary care physician for evaluation of blood pressure as discussed  F/U surgical evaluation. May consider pending follow-up evaluations  F/U neurological evaluation. May consider pending follow-up evaluations  May consider radiofrequency rhizolysis or intraspinal procedures pending response to present treatment and F/U evaluation   Patient to call [facility] should patient have concerns prior to scheduled return appointment.

## 2015-06-11 NOTE — Progress Notes (Signed)
   Subjective:    Patient ID: Margaret Shepard, female    DOB: [birth year], 54 y.o.   MRN: [id number]  HPI Patient is 54 year old female returns to [facility] for further evaluation and treatment of pain involving the cervical upper extremity region and headaches as well as the lumbar lower extremity region status post total hip replacements. At the present time patient states that pain is well controlled at this time. Patient with prior surgery of the cervical region and denies any significant pain of the cervical region headaches at this time. We will continue medications as prescribed and will remain available to consider modification of treatment should there be change in patient's condition. The patient was understanding and agree suggested treatment plan.   Review of Systems     Objective:   Physical Exam There was mild tenderness over the splenius capitis and occipitalis muscles regions. Palpation of the thoracic facet and cervical facet paraspinal musculature regions were with mild tends to palpation. There was decreased grip strength on the left compared to the right. There appeared to be unremarkable Spurling's maneuver. Palpation of the thoracic facet thoracic paraspinal muscles region was with mild tends to palpation without significant muscle spasms in the upper and mid regions with moderate muscle spasms in the lower thoracic paraspinal musculature region. Palpation over the region of the lumbar facet lumbar paraspinal muscles region was with moderate tends to palpation. There was moderate tenderness of the greater trochanteric region iliotibial band region. Straight leg raising limited to approximately 20 without a definite increased pain with dorsiflexion noted. EHL strength appeared to be decreased there was negative clonus negative Homans. No sensory deficit of dermatomal distribution the lower extremities noted. Abdomen soft nontender with no costovertebral maintenance  noted.       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 May consider pending follow-up evaluations  F/U neurological evaluation. May consider pending follow-up evaluations  May consider radiofrequency rhizolysis or intraspinal procedures pending response to present treatment and F/U evaluation   Patient to call [facility] should patient have concerns prior to scheduled return appointment.

## 2015-06-11 NOTE — Progress Notes (Signed)
Safety precautions to be maintained throughout the outpatient stay will include: orient to surroundings, keep bed in low position, maintain call bell within reach at all times, provide assistance with transfer out of bed and ambulation.  

## 2015-07-09 ENCOUNTER — Ambulatory Visit: Payer: Medicaid Other | Attending: Pain Medicine | Admitting: Pain Medicine

## 2015-07-09 VITALS — BP 158/88 | HR 98 | Temp 97.7°F | Resp 16 | Ht 65.0 in | Wt 137.0 lb

## 2015-07-09 DIAGNOSIS — M542 Cervicalgia: Secondary | ICD-10-CM | POA: Diagnosis present

## 2015-07-09 DIAGNOSIS — M5481 Occipital neuralgia: Secondary | ICD-10-CM | POA: Diagnosis not present

## 2015-07-09 DIAGNOSIS — M62830 Muscle spasm of back: Secondary | ICD-10-CM | POA: Insufficient documentation

## 2015-07-09 DIAGNOSIS — M533 Sacrococcygeal disorders, not elsewhere classified: Secondary | ICD-10-CM

## 2015-07-09 DIAGNOSIS — Z96643 Presence of artificial hip joint, bilateral: Secondary | ICD-10-CM | POA: Insufficient documentation

## 2015-07-09 DIAGNOSIS — M47812 Spondylosis without myelopathy or radiculopathy, cervical region: Secondary | ICD-10-CM | POA: Diagnosis not present

## 2015-07-09 DIAGNOSIS — M5126 Other intervertebral disc displacement, lumbar region: Secondary | ICD-10-CM | POA: Diagnosis not present

## 2015-07-09 DIAGNOSIS — M5030 Other cervical disc degeneration, unspecified cervical region: Secondary | ICD-10-CM | POA: Diagnosis not present

## 2015-07-09 DIAGNOSIS — M5136 Other intervertebral disc degeneration, lumbar region: Secondary | ICD-10-CM | POA: Diagnosis not present

## 2015-07-09 DIAGNOSIS — M51369 Other intervertebral disc degeneration, lumbar region without mention of lumbar back pain or lower extremity pain: Secondary | ICD-10-CM

## 2015-07-09 DIAGNOSIS — M47816 Spondylosis without myelopathy or radiculopathy, lumbar region: Secondary | ICD-10-CM | POA: Diagnosis not present

## 2015-07-09 DIAGNOSIS — Z981 Arthrodesis status: Secondary | ICD-10-CM

## 2015-07-09 DIAGNOSIS — G588 Other specified mononeuropathies: Secondary | ICD-10-CM | POA: Diagnosis not present

## 2015-07-09 DIAGNOSIS — Z96642 Presence of left artificial hip joint: Secondary | ICD-10-CM

## 2015-07-09 DIAGNOSIS — R51 Headache: Secondary | ICD-10-CM | POA: Diagnosis present

## 2015-07-09 DIAGNOSIS — M79602 Pain in left arm: Secondary | ICD-10-CM | POA: Diagnosis present

## 2015-07-09 DIAGNOSIS — M1288 Other specific arthropathies, not elsewhere classified, other specified site: Secondary | ICD-10-CM | POA: Diagnosis not present

## 2015-07-09 MED ORDER — GABAPENTIN 300 MG PO CAPS: ORAL_CAPSULE | ORAL | Status: DC

## 2015-07-09 MED ORDER — CYCLOBENZAPRINE HCL 5 MG PO TABS: ORAL_TABLET | ORAL | Status: DC

## 2015-07-09 MED ORDER — OXYCODONE HCL ER 30 MG PO T12A: EXTENDED_RELEASE_TABLET | ORAL | Status: DC

## 2015-07-09 MED ORDER — DIAZEPAM 5 MG PO TABS: ORAL_TABLET | ORAL | Status: DC

## 2015-07-09 MED ORDER — OXYCODONE HCL 10 MG PO TABS: ORAL_TABLET | ORAL | Status: DC

## 2015-07-09 NOTE — Progress Notes (Signed)
   Subjective:    Patient ID: Margaret Shepard, female    DOB: 12/25/1960, 54 y.o.   MRN: [id number]  HPI    Review of Systems     Objective:   Physical Exam        Assessment & Plan:   

## 2015-07-09 NOTE — Patient Instructions (Signed)
PLAN   Continue present medication Neurontin Flexeril Valium oxycodone and OxyContin  F/U PCP  Dr. Comstock for evaliation of  BP and general medical  condition  F/U surgical evaluation. May consider pending follow-up evaluations  F/U neurological evaluation. May consider pending follow-up evaluations  May consider radiofrequency rhizolysis or intraspinal procedures pending response to present treatment and F/U evaluation   Patient to call [facility] should patient have concerns prior to scheduled return appointment. 

## 2015-07-09 NOTE — Progress Notes (Signed)
Safety precautions to be maintained throughout the outpatient stay will include: orient to surroundings, keep bed in low position, maintain call bell within reach at all times, provide assistance with transfer out of bed and ambulation.   Subjective:    Patient ID: Margaret Shepard, female    DOB: Jan 16, 1961, 54 y.o.   MRN: [id number]  HPI    Review of Systems     Objective:   Physical Exam        Assessment & Plan:

## 2015-07-09 NOTE — Progress Notes (Signed)
   Subjective:    Patient ID: Margaret Shepard, female    DOB: [birth year], 54 y.o.   MRN: [id number]  HPIpatient is 54 year old female returns to [facility] for further evaluation and treatment of pain involving the region of the neck upper extremity region and headaches lower back lower extremity regions. Patient denies trauma change in events of daily living the call significant change in symptomatology. Patient states that she has pain fairly well-controlled at this time. We will continue to present medications and we'll avoid interventional treatment as discussed and explained to patient on today's visit. The patient was in agreement with suggested treatment plan. We will continue presently prescribed medications including Neurontin Flexeril Valium and oxycodone and OxyContin. All in agreement with suggested treatment plan. Patient denies any undesirable side effects due to the medication since take medications allow her to perform activities of daily living as well as obtain restful sleep without severe pain interfering with her activities of daily living or her sleep.     Review of Systems     Objective:   Physical Exam  There was mild tenderness of the splenius capitis and occipitalis musculature regions. Clear. A be unremarkable Spurling's maneuver. Patient appeared to be with decreased grip strength. There was tends to palpation of the cervical facet cervical paraspinal musculature region as well as the thoracic facet thoracic paraspinal musculature regions. Palpation of the acromioclavicular and glenohumeral joint regions reproduced minimal discomfort. Patient appeared to be with unremarkable Spurling's maneuver. There was tenderness to palpation of the thoracic facet thoracic paraspinal muscles region of moderate degree with no crepitus of the thoracic region noted. Palpation over the lumbar paraspinal muscles region lumbar facet region with tinged palpation of moderate the  severe degree. Lateral bending and rotation and extension to palpation of the lumbar facets reproduce moderately severe discomfort. There was no crepitus of the thoracic region noted. There was tenderness to palpation of the greater trochanteric region iliotibial band region of moderate degree. Palpation of the gluteal and piriformis musculature regions reproduced moderate discomfort. No definite sensory deficit of dermatomal disc space was detected. There was negative clonus negative Homans. DTRs were difficult to elicit patient had difficulty relaxing. There was negative clonus negative Homans. Abdomen was nontender no costovertebral maintenance noted.      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 May consider pending follow-up evaluations  F/U neurological evaluation. May consider pending follow-up evaluations  May consider radiofrequency rhizolysis or intraspinal procedures pending response to present treatment and F/U evaluation   Patient to call [facility] should patient have concerns prior to scheduled return appointment.

## 2015-08-04 ENCOUNTER — Encounter: Payer: Self-pay | Admitting: Pain Medicine

## 2015-08-04 ENCOUNTER — Ambulatory Visit: Payer: Medicaid Other | Attending: Pain Medicine | Admitting: Pain Medicine

## 2015-08-04 VITALS — BP 139/77 | HR 112 | Temp 97.3°F | Resp 16 | Ht 65.0 in | Wt 136.0 lb

## 2015-08-04 DIAGNOSIS — Z96642 Presence of left artificial hip joint: Secondary | ICD-10-CM

## 2015-08-04 DIAGNOSIS — M5481 Occipital neuralgia: Secondary | ICD-10-CM | POA: Diagnosis not present

## 2015-08-04 DIAGNOSIS — Z96649 Presence of unspecified artificial hip joint: Secondary | ICD-10-CM | POA: Diagnosis not present

## 2015-08-04 DIAGNOSIS — Z981 Arthrodesis status: Secondary | ICD-10-CM

## 2015-08-04 DIAGNOSIS — M792 Neuralgia and neuritis, unspecified: Secondary | ICD-10-CM | POA: Diagnosis not present

## 2015-08-04 DIAGNOSIS — M5030 Other cervical disc degeneration, unspecified cervical region: Secondary | ICD-10-CM

## 2015-08-04 DIAGNOSIS — M62830 Muscle spasm of back: Secondary | ICD-10-CM | POA: Diagnosis not present

## 2015-08-04 DIAGNOSIS — M5126 Other intervertebral disc displacement, lumbar region: Secondary | ICD-10-CM | POA: Diagnosis not present

## 2015-08-04 DIAGNOSIS — M5136 Other intervertebral disc degeneration, lumbar region: Secondary | ICD-10-CM | POA: Diagnosis present

## 2015-08-04 DIAGNOSIS — M533 Sacrococcygeal disorders, not elsewhere classified: Secondary | ICD-10-CM

## 2015-08-04 MED ORDER — OXYCODONE HCL 10 MG PO TABS: ORAL_TABLET | ORAL | Status: DC

## 2015-08-04 MED ORDER — OXYCODONE HCL ER 30 MG PO T12A: EXTENDED_RELEASE_TABLET | ORAL | Status: DC

## 2015-08-04 MED ORDER — DIAZEPAM 5 MG PO TABS: ORAL_TABLET | ORAL | Status: DC

## 2015-08-04 NOTE — Progress Notes (Signed)
   Subjective:    Patient ID: Margaret Shepard, female    DOB: 07/01/1961, 54 y.o.   MRN: [id number]  HPI  Patient 54 year old female returns to [facility] for further evaluation and treatment of pain involving the neck upper extremity regions knees and lower back and lower extremity region. Patient is with prior total hip replacement as well as surgery of the cervical region. Patient states that pain is fairly well-controlled at this time. We discussed patient undergoing further evaluation and patient will proceed with obtaining evaluation with Dr. Maryellen PileEason for evaluation and treatment in terms of patient's primary care. We will continue not interventional treatment of patient this time and will remain ablative consider modification of treatment should there be change in condition. Patient was with understanding and in agreement status treatment plan the patient will continue Flexeril and Neurontin Valium and oxycodone and OxyContin as prescribed. Patient is tolerating well with fairly well-controlled of pain at this time. The patient was with understanding and in agreement with suggested treatment plan     Review of Systems     Objective:   Physical Exam  There was tenderness of the splenius capitis and occipitalis musculature regions of mild degree there was unremarkable Spurling's maneuver. There was tenderness over the cervical facet cervical paraspinal musculature region of mild degree. There was decreased grip strength noted on the left compared to the right there was tenderness over the region of the paraspinal muscles region thoracic region thoracic facet region without crepitus of the thoracic region noted there was evidence of significant muscle spasms occurring mid and lower thoracic regions with severe muscle spasms noted on the left. Palpation over the region of the lumbar paraspinal muscles region lumbar facet region was with moderate tends to palpation. Lateral bending and  rotation and extension and palpation over the lumbar facets reproduce moderate discomfort. There was moderate tenderness of the greater trochanteric region iliotibial band region as well as the PSIS and PII S region and gluteal and piriformis musketry regions. Straight leg raising limited to approximately 20 without increased pain with dorsiflexion noted. DTRs appear to be trace at the knees. Patient was with decreased EHL strength with negative clonus negative Homans. No definite sensory deficit of dermatomal distribution of the lower extremities noted. With no costovertebral angle tenderness noted.      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 The patient will follow-up with Dr. Maryellen PileEason for for primary care as discussed  F/U surgical evaluation. May consider pending follow-up evaluations  F/U neurological evaluation. May consider pending follow-up evaluations  May consider radiofrequency rhizolysis or intraspinal procedures pending response to present treatment and F/U evaluation   Patient to call [facility] should patient have concerns prior to scheduled return appointment.

## 2015-08-04 NOTE — Patient Instructions (Signed)
PLAN   Continue present medication Neurontin Flexeril Valium oxycodone and OxyContin  F/U PCP  Dr. Jorene Guestomstock for evaliation of  BP and general medical  condition  F/U surgical evaluation. May consider pending follow-up evaluations  F/U neurological evaluation. May consider pending follow-up evaluations  May consider radiofrequency rhizolysis or intraspinal procedures pending response to present treatment and F/U evaluation   Patient to call [facility] should patient have concerns prior to scheduled return appointment.

## 2015-08-04 NOTE — Progress Notes (Signed)
Safety precautions to be maintained throughout the outpatient stay will include: orient to surroundings, keep bed in low position, maintain call bell within reach at all times, provide assistance with transfer out of bed and ambulation.  

## 2015-09-08 ENCOUNTER — Ambulatory Visit: Payer: Medicaid Other | Attending: Pain Medicine | Admitting: Pain Medicine

## 2015-09-08 ENCOUNTER — Encounter: Payer: Self-pay | Admitting: Pain Medicine

## 2015-09-08 VITALS — BP 155/99 | HR 98 | Temp 98.4°F | Resp 16 | Ht 65.0 in | Wt 138.0 lb

## 2015-09-08 DIAGNOSIS — M533 Sacrococcygeal disorders, not elsewhere classified: Secondary | ICD-10-CM

## 2015-09-08 DIAGNOSIS — M1288 Other specific arthropathies, not elsewhere classified, other specified site: Secondary | ICD-10-CM | POA: Insufficient documentation

## 2015-09-08 DIAGNOSIS — G588 Other specified mononeuropathies: Secondary | ICD-10-CM | POA: Diagnosis not present

## 2015-09-08 DIAGNOSIS — M47812 Spondylosis without myelopathy or radiculopathy, cervical region: Secondary | ICD-10-CM | POA: Insufficient documentation

## 2015-09-08 DIAGNOSIS — Z96649 Presence of unspecified artificial hip joint: Secondary | ICD-10-CM | POA: Insufficient documentation

## 2015-09-08 DIAGNOSIS — Z96642 Presence of left artificial hip joint: Secondary | ICD-10-CM

## 2015-09-08 DIAGNOSIS — M5030 Other cervical disc degeneration, unspecified cervical region: Secondary | ICD-10-CM | POA: Insufficient documentation

## 2015-09-08 DIAGNOSIS — M5481 Occipital neuralgia: Secondary | ICD-10-CM | POA: Diagnosis not present

## 2015-09-08 DIAGNOSIS — M79601 Pain in right arm: Secondary | ICD-10-CM | POA: Diagnosis present

## 2015-09-08 DIAGNOSIS — M542 Cervicalgia: Secondary | ICD-10-CM | POA: Diagnosis present

## 2015-09-08 DIAGNOSIS — Z981 Arthrodesis status: Secondary | ICD-10-CM

## 2015-09-08 DIAGNOSIS — M5136 Other intervertebral disc degeneration, lumbar region: Secondary | ICD-10-CM | POA: Diagnosis not present

## 2015-09-08 DIAGNOSIS — M79602 Pain in left arm: Secondary | ICD-10-CM | POA: Diagnosis present

## 2015-09-08 DIAGNOSIS — M47816 Spondylosis without myelopathy or radiculopathy, lumbar region: Secondary | ICD-10-CM | POA: Diagnosis not present

## 2015-09-08 DIAGNOSIS — M5126 Other intervertebral disc displacement, lumbar region: Secondary | ICD-10-CM | POA: Diagnosis not present

## 2015-09-08 MED ORDER — OXYCODONE HCL 10 MG PO TABS: ORAL_TABLET | ORAL | Status: DC

## 2015-09-08 MED ORDER — OXYCODONE HCL ER 30 MG PO T12A: EXTENDED_RELEASE_TABLET | ORAL | Status: DC

## 2015-09-08 MED ORDER — CYCLOBENZAPRINE HCL 5 MG PO TABS: ORAL_TABLET | ORAL | Status: DC

## 2015-09-08 MED ORDER — GABAPENTIN 300 MG PO CAPS: ORAL_CAPSULE | ORAL | Status: DC

## 2015-09-08 MED ORDER — DIAZEPAM 5 MG PO TABS: ORAL_TABLET | ORAL | Status: DC

## 2015-09-08 NOTE — Patient Instructions (Signed)
PLAN   Continue present medication Neurontin Flexeril Valium oxycodone and OxyContin  F/U PCP  for evaliation of  BP and general medical  condition  F/U surgical evaluation. May consider pending follow-up evaluations as discussed   F/U neurological evaluation. May consider PNCV/EMG studies and other studies pending follow-up evaluations  May consider radiofrequency rhizolysis or intraspinal procedures pending response to present treatment and F/U evaluation   Patient to call [facility] should patient have concerns prior to scheduled return appointment.

## 2015-09-08 NOTE — Progress Notes (Signed)
Subjective:    Patient ID: Margaret Shepard, female    DOB: 10/17/1960, 54 y.o.   MRN: [id number]  HPI   The patient is a 54 year old female who returns to pain management Center for further evaluation and treatment of pain involving the neck upper extremity region headaches as well as the upper mid lower back and lower extremity regions. The patient is with pain fairly well-controlled at this time. The patient is with prior history of total hip replacement as well as fracture of the lower extremities. The patient states the pain is well controlled present time. We will continue present medications consisting of Neurontin Flexeril Valium and oxycodone and OxyContin. We will consider patient for modification of treatment pending follow-up evaluation. Patient also is with pain involving the region of the knees. We discussed interventional treatment of pain involving the knees and May consider such pending follow-up evaluations. At the present time we will avoid interventional treatment and will continue presently prescribed medications were not interventional treatment of patient's pain. The patient denies any trauma change in events of daily living and states that her pain is well-controlled at this time. All were understanding and agreed to suggested treatment plan.    Review of Systems     Objective:   Physical Exam  There was tends to palpation of the paraspinal musculature region the cervical region cervical facet region. There was tenderness over the splenius capitis and a separate talus region of mild to moderate degree. The patient appeared to be with decreased grip strength on the left compared to the right. There was unremarkable Spurling's maneuver. Palpation of the acromial clavicular and glenohumeral joint regions reproduce mild to moderate discomfort. There was tends to palpation over the thoracic facet thoracic paraspinal musculature he evidence of muscle spasms in the lower thoracic  paraspinal musculature region. Palpation over the lumbar paraspinal musculature and lumbar facet region was attends to palpation with lateral bending rotation extension and palpation of the lumbar facets reproducing moderate discomfort. There was tends to palpation of the greater trochanteric region iliotibial band region on the left as well as on the right. There was tenderness to palpation over the PSIS and PII S region a moderate degree as well.. Straight leg raising was limited to approximately 20 without a definite increased pain with dorsiflexion noted. DTRs appeared to be trace at the knees. There was negative clonus negative Homans. Knees were with no ballottement of the patella. There was crepitus of the knees with negative anterior and posterior drawer signs. There was negative clonus negative Homans. Abdomen nontender with no costovertebral tenderness noted.      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 May consider pending follow-up evaluations as discussed   F/U neurological evaluation. May consider PNCV/EMG studies and other studies pending follow-up evaluations  May consider radiofrequency rhizolysis or intraspinal procedures pending response to present treatment and F/U evaluation   Patient to call Pain Management Center should  patient have concerns prior to scheduled return  appointment.

## 2015-09-08 NOTE — Progress Notes (Signed)
Safety precautions to be maintained throughout the outpatient stay will include: orient to surroundings, keep bed in low position, maintain call bell within reach at all times, provide assistance with transfer out of bed and ambulation.  

## 2015-09-18 ENCOUNTER — Other Ambulatory Visit: Payer: Self-pay | Admitting: Pain Medicine

## 2015-10-08 ENCOUNTER — Encounter: Payer: Self-pay | Admitting: Pain Medicine

## 2015-10-08 ENCOUNTER — Ambulatory Visit: Payer: Medicaid Other | Attending: Pain Medicine | Admitting: Pain Medicine

## 2015-10-08 VITALS — BP 151/97 | HR 97 | Temp 97.4°F | Resp 16 | Ht 65.0 in | Wt 138.0 lb

## 2015-10-08 DIAGNOSIS — M5030 Other cervical disc degeneration, unspecified cervical region: Secondary | ICD-10-CM

## 2015-10-08 DIAGNOSIS — M542 Cervicalgia: Secondary | ICD-10-CM | POA: Diagnosis present

## 2015-10-08 DIAGNOSIS — M533 Sacrococcygeal disorders, not elsewhere classified: Secondary | ICD-10-CM

## 2015-10-08 DIAGNOSIS — M5126 Other intervertebral disc displacement, lumbar region: Secondary | ICD-10-CM | POA: Insufficient documentation

## 2015-10-08 DIAGNOSIS — M51369 Other intervertebral disc degeneration, lumbar region without mention of lumbar back pain or lower extremity pain: Secondary | ICD-10-CM

## 2015-10-08 DIAGNOSIS — M545 Low back pain: Secondary | ICD-10-CM | POA: Diagnosis present

## 2015-10-08 DIAGNOSIS — M5136 Other intervertebral disc degeneration, lumbar region: Secondary | ICD-10-CM

## 2015-10-08 DIAGNOSIS — Z96642 Presence of left artificial hip joint: Secondary | ICD-10-CM

## 2015-10-08 DIAGNOSIS — M47812 Spondylosis without myelopathy or radiculopathy, cervical region: Secondary | ICD-10-CM | POA: Diagnosis not present

## 2015-10-08 DIAGNOSIS — Z9889 Other specified postprocedural states: Secondary | ICD-10-CM | POA: Diagnosis not present

## 2015-10-08 DIAGNOSIS — M62830 Muscle spasm of back: Secondary | ICD-10-CM | POA: Diagnosis not present

## 2015-10-08 DIAGNOSIS — M5481 Occipital neuralgia: Secondary | ICD-10-CM | POA: Diagnosis not present

## 2015-10-08 DIAGNOSIS — M47816 Spondylosis without myelopathy or radiculopathy, lumbar region: Secondary | ICD-10-CM | POA: Insufficient documentation

## 2015-10-08 DIAGNOSIS — G588 Other specified mononeuropathies: Secondary | ICD-10-CM | POA: Insufficient documentation

## 2015-10-08 DIAGNOSIS — Z981 Arthrodesis status: Secondary | ICD-10-CM

## 2015-10-08 MED ORDER — OXYCODONE HCL 10 MG PO TABS: ORAL_TABLET | ORAL | Status: DC

## 2015-10-08 MED ORDER — DIAZEPAM 5 MG PO TABS: ORAL_TABLET | ORAL | Status: DC

## 2015-10-08 MED ORDER — OXYCODONE HCL ER 30 MG PO T12A: EXTENDED_RELEASE_TABLET | ORAL | Status: DC

## 2015-10-08 NOTE — Patient Instructions (Addendum)
PLAN   Continue present medication Neurontin Flexeril Valium oxycodone and OxyContin  F/U PCP at Urgent Care as we discussed for evaliation of  BP and general medical  condition  F/U surgical evaluation. May consider pending follow-up evaluations as discussed   F/U neurological evaluation. May consider PNCV/EMG studies and other studies pending follow-up evaluations  May consider radiofrequency rhizolysis or intraspinal procedures pending response to present treatment and F/U evaluation . We will avoid considering these procedures at this time  Patient to call Pain Management Center should patient have concerns prior to scheduled return appointment.

## 2015-10-08 NOTE — Progress Notes (Signed)
Safety precautions to be maintained throughout the outpatient stay will include: orient to surroundings, keep bed in low position, maintain call bell within reach at all times, provide assistance with transfer out of bed and ambulation.  

## 2015-10-08 NOTE — Progress Notes (Signed)
   Subjective:    Patient ID: Margaret Shepard, female    DOB: 11/09/1960, 54 y.o.   MRN: [id number]  HPI  The patient is a 54 year old female who returns to pain management for further evaluation and treatment of pain involving the region of the neck upper extremity regions lower back and lower extremity region. On today's visit patient was noted to be with increased grip strength of the left upper extremity. Patient states that she has been using left upper extremity more and that she has noted improvement in terms of grip strength and of the left upper extremity. The patient is status post prior surgery of the cervical region with the patient is well. The patient appeared to be tolerating medications well without undesirable side effects. We will continue present medications and will avoid interventional treatment. Patient denies any increased pain and states that headaches as well as pain of the neck into back upper and lower extremity region and hips appears to be fairly well-controlled at this time. We'll continue medications as prescribed consisting of Neurontin and OxyContin oxycodone and Flexeril. The patient is understanding and agreed to suggested treatment plan.       Review of Systems     Objective:   Physical Exam  There was tenderness to palpation of paraspinal musculature and cervical region cervical facet region palpation which reproduces pain of moderate discomfort. There was moderate tends to palpation over the cervical facet cervical paraspinal musculature region splenius capitis and occipitalis musculature region and thoracic facet thoracic paraspinal musculature region. The patient appeared to be with unremarkable Spurling's maneuver and was with decreased grip strength on the left compared to the right with Tinel and Phalen's maneuver reproducing minimal discomfort. Palpation over the lumbar paraspinal must reason lumbar facet region was with moderate tenderness to  palpation with moderate tenderness to palpation of the PSIS and PII S region as well as the gluteal and piriformis musculature region. There was moderate tenderness to palpation of moderately severe tenderness to palpation of the greater trochanteric region iliotibial band region with straight leg raising tolerates approximately 20 without increased pain with dorsiflexion noted. DTRs appeared to be trace at the knees. There was no sensory deficit or dermatomal dystrophy detected. There was negative clonus negative Homans. EHL strength appeared to be decreased. Abdomen was nontender with no costovertebral tenderness noted.    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 May consider pending follow-up evaluations as discussed   F/U neurological evaluation. May consider PNCV/EMG studies and other studies pending follow-up evaluations  May consider radiofrequency rhizolysis or intraspinal procedures pending response to present treatment and F/U evaluation . We will avoid considering these procedures at this time  Patient to call Pain Management Center should patient have concerns prior to scheduled return appointment.

## 2015-11-10 ENCOUNTER — Encounter: Payer: Self-pay | Admitting: Pain Medicine

## 2015-11-10 ENCOUNTER — Ambulatory Visit: Payer: Medicaid Other | Attending: Pain Medicine | Admitting: Pain Medicine

## 2015-11-10 VITALS — BP 148/102 | HR 90 | Temp 98.6°F | Resp 18 | Ht 65.0 in | Wt 139.0 lb

## 2015-11-10 DIAGNOSIS — M62830 Muscle spasm of back: Secondary | ICD-10-CM | POA: Insufficient documentation

## 2015-11-10 DIAGNOSIS — M546 Pain in thoracic spine: Secondary | ICD-10-CM | POA: Diagnosis present

## 2015-11-10 DIAGNOSIS — M47812 Spondylosis without myelopathy or radiculopathy, cervical region: Secondary | ICD-10-CM | POA: Diagnosis not present

## 2015-11-10 DIAGNOSIS — M5481 Occipital neuralgia: Secondary | ICD-10-CM | POA: Diagnosis not present

## 2015-11-10 DIAGNOSIS — M5126 Other intervertebral disc displacement, lumbar region: Secondary | ICD-10-CM | POA: Diagnosis not present

## 2015-11-10 DIAGNOSIS — M5030 Other cervical disc degeneration, unspecified cervical region: Secondary | ICD-10-CM | POA: Insufficient documentation

## 2015-11-10 DIAGNOSIS — M47816 Spondylosis without myelopathy or radiculopathy, lumbar region: Secondary | ICD-10-CM | POA: Insufficient documentation

## 2015-11-10 DIAGNOSIS — M542 Cervicalgia: Secondary | ICD-10-CM | POA: Diagnosis present

## 2015-11-10 DIAGNOSIS — G588 Other specified mononeuropathies: Secondary | ICD-10-CM | POA: Insufficient documentation

## 2015-11-10 DIAGNOSIS — M5136 Other intervertebral disc degeneration, lumbar region: Secondary | ICD-10-CM | POA: Diagnosis not present

## 2015-11-10 DIAGNOSIS — M533 Sacrococcygeal disorders, not elsewhere classified: Secondary | ICD-10-CM

## 2015-11-10 DIAGNOSIS — Z981 Arthrodesis status: Secondary | ICD-10-CM

## 2015-11-10 DIAGNOSIS — Z96642 Presence of left artificial hip joint: Secondary | ICD-10-CM

## 2015-11-10 MED ORDER — CYCLOBENZAPRINE HCL 5 MG PO TABS: ORAL_TABLET | ORAL | Status: DC

## 2015-11-10 MED ORDER — OXYCODONE HCL ER 30 MG PO T12A: EXTENDED_RELEASE_TABLET | ORAL | Status: DC

## 2015-11-10 MED ORDER — OXYCODONE HCL 10 MG PO TABS: ORAL_TABLET | ORAL | Status: DC

## 2015-11-10 MED ORDER — GABAPENTIN 300 MG PO CAPS: ORAL_CAPSULE | ORAL | Status: DC

## 2015-11-10 MED ORDER — DIAZEPAM 5 MG PO TABS: ORAL_TABLET | ORAL | Status: DC

## 2015-11-10 NOTE — Patient Instructions (Signed)
PLAN   Continue present medication Neurontin Flexeril Valium oxycodone and OxyContin  F/U PCP at Urgent Care as we discussed for evaliation of  BP and general medical  condition Pease see physician today for evaluation of elevated blood pressure  F/U surgical evaluation. May consider pending follow-up evaluations as discussed   F/U neurological evaluation. May consider PNCV/EMG studies and other studies pending follow-up evaluations  May consider radiofrequency rhizolysis or intraspinal procedures pending response to present treatment and F/U evaluation . We will avoid considering these procedures at this time  Patient to call Pain Management Center should patient have concerns prior to scheduled return appointment.

## 2015-11-10 NOTE — Progress Notes (Signed)
Subjective:    Patient ID: Margaret Shepard, female    DOB: 10-21-1960, 55 y.o.   MRN: [id number]  HPI The patient is a 55 year old female who returns to pain management for further evaluation and treatment of pain involving the neck entire back upper and lower extremity regions. On today's visit we noted patient's blood pressure to be elevated. The patient stated that she was evaluated by her primary care physician on Monday, January 30 and blood pressure was 150/90. Patient states that she is without nausea or vomiting. Vision headache or any other symptoms suggestive of elevated blood pressure. We discussed patient's condition and advised patient to follow her primary care physician to report elevated blood pressure which we noted on today's visit. The patient states that she will do such. The patient denies trauma change in events of daily living the call significant change in symptoms pathology. Patient continues medications without undesirable side effects and is able to perform most activities of daily living without significant pain interfering with activities of daily living or ability to obtain restful sleep. Patient also states that she is without any trauma change in events of daily living the call significant change in symptomatology. We will continue medications as per prescribed. The patient was in agreement with suggested treatment plan   Review of Systems     Objective:   Physical Exam  There was tenderness to palpation of the paraspinal musculature region of the cervical region cervical facet region palpation which reproduces mild discomfort there was tenderness of the splenius capitis and occipitalis region a mild to moderate degree. There was moderate tenderness to palpation of the trapezius levator scapula and rhomboid musculature regions. The patient was with decreased grip strength on the left compared to the right. Palpation over the region of the acromioclavicular and  glenohumeral joint regions reproduces moderate discomfort. Tinel and Phalen's maneuver were without increase of pain of significant degree. Palpation over the thoracic facet thoracic paraspinal must reason was attends to palpation of moderate degree in the lower thoracic region with moderate muscle spasms of the lower thoracic region noted. Palpation of the lumbar paraspinal musculature region lumbar facet region was with moderate to moderately severe discomfort with lateral bending rotation extension and palpation of the lumbar facets reproducing moderate to moderately severe discomfort. There was mild to moderate tenderness of the greater trochanteric region and iliotibial band region with moderate tenderness to palpation over the PSIS and PII S regions. Straight leg raising was tolerates approximately 20 without increased pain with dorsiflexion noted. EHL strength was decreased without a definite sensory deficit or dermatomal dystrophy detected. There was negative clonus negative Homans. DTRs appeared to be 1+. There was negative Homans. 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 May consider pending follow-up evaluations as discussed   F/U  neurological evaluation. May consider PNCV/EMG studies and other studies pending follow-up evaluations  May consider radiofrequency rhizolysis or intraspinal procedures pending response to present treatment and F/U evaluation . We will avoid considering these procedures at this time  Patient to call Pain Management Center should patient have concerns prior to scheduled return appointment.

## 2015-11-10 NOTE — Progress Notes (Signed)
Safety precautions to be maintained throughout the outpatient stay will include: orient to surroundings, keep bed in low position, maintain call bell within reach at all times, provide assistance with transfer out of bed and ambulation.  

## 2015-11-10 NOTE — Progress Notes (Signed)
   Subjective:    Patient ID: Margaret Shepard, female    DOB: [birth year], 55 y.o.   MRN: [id number]  HPI    Review of Systems     Objective:   Physical Exam        Assessment & Plan:

## 2015-12-08 ENCOUNTER — Ambulatory Visit: Payer: Medicaid Other | Attending: Pain Medicine | Admitting: Pain Medicine

## 2015-12-08 ENCOUNTER — Encounter: Payer: Self-pay | Admitting: Pain Medicine

## 2015-12-08 ENCOUNTER — Telehealth: Payer: Self-pay | Admitting: Pain Medicine

## 2015-12-08 VITALS — BP 150/100 | HR 105 | Temp 97.8°F | Resp 16 | Ht 65.0 in | Wt 139.0 lb

## 2015-12-08 DIAGNOSIS — M5136 Other intervertebral disc degeneration, lumbar region: Secondary | ICD-10-CM | POA: Insufficient documentation

## 2015-12-08 DIAGNOSIS — M5481 Occipital neuralgia: Secondary | ICD-10-CM | POA: Insufficient documentation

## 2015-12-08 DIAGNOSIS — M5030 Other cervical disc degeneration, unspecified cervical region: Secondary | ICD-10-CM

## 2015-12-08 DIAGNOSIS — Z96642 Presence of left artificial hip joint: Secondary | ICD-10-CM

## 2015-12-08 DIAGNOSIS — M62830 Muscle spasm of back: Secondary | ICD-10-CM | POA: Insufficient documentation

## 2015-12-08 DIAGNOSIS — G588 Other specified mononeuropathies: Secondary | ICD-10-CM | POA: Insufficient documentation

## 2015-12-08 DIAGNOSIS — M549 Dorsalgia, unspecified: Secondary | ICD-10-CM | POA: Diagnosis present

## 2015-12-08 DIAGNOSIS — M47812 Spondylosis without myelopathy or radiculopathy, cervical region: Secondary | ICD-10-CM | POA: Diagnosis not present

## 2015-12-08 DIAGNOSIS — M542 Cervicalgia: Secondary | ICD-10-CM | POA: Diagnosis present

## 2015-12-08 DIAGNOSIS — M47816 Spondylosis without myelopathy or radiculopathy, lumbar region: Secondary | ICD-10-CM | POA: Diagnosis not present

## 2015-12-08 DIAGNOSIS — Z981 Arthrodesis status: Secondary | ICD-10-CM

## 2015-12-08 DIAGNOSIS — M533 Sacrococcygeal disorders, not elsewhere classified: Secondary | ICD-10-CM

## 2015-12-08 DIAGNOSIS — M5126 Other intervertebral disc displacement, lumbar region: Secondary | ICD-10-CM | POA: Diagnosis not present

## 2015-12-08 MED ORDER — OXYCODONE HCL 10 MG PO TABS: ORAL_TABLET | ORAL | Status: DC

## 2015-12-08 MED ORDER — DIAZEPAM 5 MG PO TABS: ORAL_TABLET | ORAL | Status: DC

## 2015-12-08 MED ORDER — OXYCODONE HCL ER 30 MG PO T12A: EXTENDED_RELEASE_TABLET | ORAL | Status: DC

## 2015-12-08 NOTE — Telephone Encounter (Signed)
testing

## 2015-12-08 NOTE — Patient Instructions (Addendum)
PLAN   Continue present medication Neurontin Flexeril Valium oxycodone and OxyContin  F/U PCP at Urgent Care as we discussed for evaliation of  BP and general medical  condition., Follow-up with primary care physician regarding elevated blood pressure as discussed  F/U surgical evaluation. May consider pending follow-up evaluations as discussed   F/U neurological evaluation. May consider PNCV/EMG studies and other studies pending follow-up evaluations  May consider radiofrequency rhizolysis or intraspinal procedures pending response to present treatment and F/U evaluation . We will avoid considering these procedures at this time  Patient to call Pain Management Center should patient have concerns prior to scheduled return appointment.

## 2015-12-08 NOTE — Progress Notes (Signed)
Subjective:    Patient ID: Lorne Skeens, female    DOB: Oct 25, 1960, 55 y.o.   MRN: [id number]  HPI  The patient is a 55 year old female who returns to pain management for further evaluation and treatment of pain involving the neck entire back and upper and lower extremity regions. The patient states the pain is fairly well-controlled at this time. The patient states that she has seen her primary care physician and has received medication for treatment of her blood pressure. The patient denies any change in condition. Patient states the pain is aggravated by activities on the feet and become more intense as the day progresses. The patient is without trauma or change in events of daily living to cause a significant change in symptomatology at this time. The patient states the pain is fairly well-controlled present time. The patient's relatives a going to the beach and patient states that she will wait until warmer weather returns prior to going to the beach. The patient also states that she wishes to avoid any strenuous activity and would prefer staying at home and set her going to the beach at this time. We will continue medications as prescribed and we'll consider patient for modification of treatment pending follow-up evaluation. All were understanding and agreement suggested treatment plan. We discussed patient's blood pressure and advised patient to follow-up with her primary care physician regarding her blood pressure as well at this time        Review of Systems     Objective:   Physical Exam  There was tenderness to palpation of the splenius capitis and occipitalis musculature regions a moderate degree with well-healed surgical scar of the cervical region without increased warmth and erythema in the region of the scar. There was limited range of motion of the cervical spine with rotation and flexion and extension. There was decreased grip strength on the left compared to the right  and Tinel and Phalen's maneuver were without increase of pain of significant degree Palpation t over the splenius capitis and occipitalis musculature regions as well as palpation over the cervical facet cervical paraspinal musculature regions reproduce moderate discomfort. Palpation over the thoracic region thoracic facet region was with tenderness to palpation with no crepitus of the thoracic region noted. There was evidence of moderate muscle spasm of the mid and lower thoracic region palpation which reproduces moderate discomfort.. Palpation over the lumbar paraspinal musculatures and lumbar facet region was attends to palpation of moderately severe degree with lateral bending rotation extension and palpation of the lumbar facets reproducing moderately severe discomfort. There was moderate tenderness of the PSIS and PII S region as well as the gluteal and piriformis musculature regions. Palpation of the greater trochanteric region iliotibial band region reproduced moderate discomfort as well. Straight leg raising was limited to approximately 20 without increased pain with dorsiflexion noted. No definite sensory deficit or dermatomal distribution was detected. Abdomen was nontender with no costovertebral tenderness noted.          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 May consider pending follow-up evaluations as discussed   F/U neurological evaluation. May consider PNCV/EMG studies and other studies pending follow-up evaluations  May consider radiofrequency rhizolysis or intraspinal procedures pending response to present treatment and F/U evaluation . We will avoid considering these procedures at this time  Patient to call Pain Management Center should patient have concerns prior to scheduled return appointment.

## 2015-12-22 ENCOUNTER — Other Ambulatory Visit: Payer: Self-pay | Admitting: Pain Medicine

## 2016-01-05 ENCOUNTER — Encounter: Payer: Self-pay | Admitting: Pain Medicine

## 2016-01-05 ENCOUNTER — Ambulatory Visit: Payer: Medicaid Other | Attending: Pain Medicine | Admitting: Pain Medicine

## 2016-01-05 VITALS — BP 128/88 | HR 93 | Temp 97.2°F | Resp 18 | Ht 65.0 in | Wt 137.0 lb

## 2016-01-05 DIAGNOSIS — M47812 Spondylosis without myelopathy or radiculopathy, cervical region: Secondary | ICD-10-CM | POA: Insufficient documentation

## 2016-01-05 DIAGNOSIS — M47816 Spondylosis without myelopathy or radiculopathy, lumbar region: Secondary | ICD-10-CM | POA: Insufficient documentation

## 2016-01-05 DIAGNOSIS — M62830 Muscle spasm of back: Secondary | ICD-10-CM | POA: Diagnosis not present

## 2016-01-05 DIAGNOSIS — M542 Cervicalgia: Secondary | ICD-10-CM | POA: Diagnosis present

## 2016-01-05 DIAGNOSIS — M79601 Pain in right arm: Secondary | ICD-10-CM | POA: Diagnosis present

## 2016-01-05 DIAGNOSIS — M79602 Pain in left arm: Secondary | ICD-10-CM | POA: Diagnosis present

## 2016-01-05 DIAGNOSIS — M5136 Other intervertebral disc degeneration, lumbar region: Secondary | ICD-10-CM | POA: Diagnosis not present

## 2016-01-05 DIAGNOSIS — M5481 Occipital neuralgia: Secondary | ICD-10-CM | POA: Insufficient documentation

## 2016-01-05 DIAGNOSIS — M5126 Other intervertebral disc displacement, lumbar region: Secondary | ICD-10-CM | POA: Insufficient documentation

## 2016-01-05 DIAGNOSIS — G588 Other specified mononeuropathies: Secondary | ICD-10-CM | POA: Diagnosis not present

## 2016-01-05 DIAGNOSIS — M4690 Unspecified inflammatory spondylopathy, site unspecified: Secondary | ICD-10-CM | POA: Insufficient documentation

## 2016-01-05 DIAGNOSIS — M5030 Other cervical disc degeneration, unspecified cervical region: Secondary | ICD-10-CM

## 2016-01-05 DIAGNOSIS — Z96642 Presence of left artificial hip joint: Secondary | ICD-10-CM

## 2016-01-05 DIAGNOSIS — Z981 Arthrodesis status: Secondary | ICD-10-CM

## 2016-01-05 DIAGNOSIS — M533 Sacrococcygeal disorders, not elsewhere classified: Secondary | ICD-10-CM

## 2016-01-05 MED ORDER — DIAZEPAM 5 MG PO TABS: ORAL_TABLET | ORAL | Status: DC

## 2016-01-05 MED ORDER — OXYCODONE HCL 10 MG PO TABS: ORAL_TABLET | ORAL | Status: DC

## 2016-01-05 MED ORDER — OXYCODONE HCL ER 30 MG PO T12A: EXTENDED_RELEASE_TABLET | ORAL | Status: DC

## 2016-01-05 NOTE — Patient Instructions (Addendum)
PLAN   Continue present medication Neurontin Flexeril Valium oxycodone and OxyContin  F/U PCP at Urgent Care as we discussed for evaliation of  BP and effect of antihypertensive medication and general medical  condition.,  F/U surgical evaluation. May consider pending follow-up evaluations as discussed   F/U neurological evaluation. May consider PNCV/EMG studies and other studies pending follow-up evaluations  May consider radiofrequency rhizolysis or intraspinal procedures pending response to present treatment and F/U evaluation . We will avoid considering these procedures at this time  Patient to call Pain Management Center should patient have concerns prior to scheduled return appointment.

## 2016-01-05 NOTE — Progress Notes (Signed)
Subjective:    Patient ID: Margaret Shepard, female    DOB: 09-23-1961, 55 y.o.   MRN: [id number]  HPI  The patient is a 55 year old female who returns to pain management for further evaluation and treatment of pain involving the neck upper extremity region as well as the lower back and lower extremity regions. The patient states that she feels much better at this time. We had repeated the requested that patient undergo evaluation by her primary care physician to consider beginning antihypertensive medication. The patient stated that she finally decided to begin antihypertensive medication after her sister recently had a stroke. The patient stated that she was convinced that it was definitely time to follow the recommendations of Dr. Moselle Rister and begin antihypertensive medication. Discussed patient's other medications and patient continues Neurontin Flexeril Valium oxycodone and OxyContin. The patient is tolerating medications well and is able to perform most activities of daily living without expansion severely disabling pain. The patient does admit to some exacerbations of pain and states that damp weather as well as cold weather seems to exacerbate her symptoms. The patient states that she continues to do fairly well at this time. We will continue medications as prescribed and we'll consider further evaluation including neurological and surgical evaluations is felt to be necessary. The patient prefers to avoid such evaluations at this time. All are in agreement with suggested treatment plan     Review of Systems     Objective:   Physical Exam  Palpation of the cervical region cervical facet region was attends to palpation of mild degree with well-healed surgical scar of the cervical region without increased warmth and erythema in the region of the scar. There were no new masses of the hip negative noted. The patient was with decreased grip strength left compared to the right.. Tinel and Phalen's  maneuver were without increase of pain of significant degree There was tenderness of the acromioclavicular and glenohumeral joint regions. Palpation of which reproduces mild discomfort. Palpation over the thoracic facet thoracic paraspinal musculature region was with moderate tends to palpation with moderate muscle spasms noted in the thoracic region. Palpation over the lumbar paraspinal must reason lumbar facet region was attends to palpation of moderate degree with lateral bending rotation extension and palpation of the lumbar facets reproducing moderate discomfort. There was moderate tenderness of the PSIS and PII S region as well as the greater trochanteric region and iliotibial band region. Leg raise was tolerates approximately 20. EHL strength appeared to be decreased. No definite sensory deficit or dermatomal distribution detected. DTRs were difficult to elicit. Patient had difficulty relaxing. There was negative clonus negative Homans. Abdomen nontender with no costovertebral tenderness noted.      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 May consider pending follow-up evaluations as discussed   F/U neurological evaluation. May consider PNCV/EMG studies and other studies pending  follow-up evaluations  May consider  radiofrequency rhizolysis or intraspinal procedures pending response to present treatment and F/U evaluation . We will avoid considering these procedures at this time  Patient to call Pain Management Center should patient have concerns prior to scheduled return appointment.

## 2016-01-05 NOTE — Progress Notes (Signed)
Safety precautions to be maintained throughout the outpatient stay will include: orient to surroundings, keep bed in low position, maintain call bell within reach at all times, provide assistance with transfer out of bed and ambulation.  

## 2016-02-04 ENCOUNTER — Encounter: Payer: Self-pay | Admitting: Pain Medicine

## 2016-02-04 ENCOUNTER — Ambulatory Visit: Payer: Medicaid Other | Attending: Pain Medicine | Admitting: Pain Medicine

## 2016-02-04 VITALS — BP 135/95 | HR 89 | Temp 98.7°F | Resp 18 | Ht 65.0 in | Wt 137.0 lb

## 2016-02-04 DIAGNOSIS — Z981 Arthrodesis status: Secondary | ICD-10-CM

## 2016-02-04 DIAGNOSIS — M5136 Other intervertebral disc degeneration, lumbar region: Secondary | ICD-10-CM

## 2016-02-04 DIAGNOSIS — M5481 Occipital neuralgia: Secondary | ICD-10-CM

## 2016-02-04 DIAGNOSIS — M533 Sacrococcygeal disorders, not elsewhere classified: Secondary | ICD-10-CM

## 2016-02-04 DIAGNOSIS — Z96642 Presence of left artificial hip joint: Secondary | ICD-10-CM

## 2016-02-04 DIAGNOSIS — M5030 Other cervical disc degeneration, unspecified cervical region: Secondary | ICD-10-CM

## 2016-02-04 MED ORDER — CYCLOBENZAPRINE HCL 5 MG PO TABS: ORAL_TABLET | ORAL | Status: DC

## 2016-02-04 MED ORDER — GABAPENTIN 300 MG PO CAPS: ORAL_CAPSULE | ORAL | Status: DC

## 2016-02-04 MED ORDER — OXYCODONE HCL 10 MG PO TABS: ORAL_TABLET | ORAL | Status: DC

## 2016-02-04 MED ORDER — DIAZEPAM 5 MG PO TABS: ORAL_TABLET | ORAL | Status: DC

## 2016-02-04 MED ORDER — OXYCODONE HCL ER 30 MG PO T12A: EXTENDED_RELEASE_TABLET | ORAL | Status: DC

## 2016-02-04 NOTE — Patient Instructions (Signed)
PLAN   Continue present medication Neurontin Flexeril Valium oxycodone and OxyContin  F/U PCP at Urgent Care as we discussed for evaliation of  BP and general medical  condition.,  F/U surgical evaluation. May consider pending follow-up evaluations as discussed   F/U neurological evaluation. May consider PNCV/EMG studies and other studies pending follow-up evaluations. We will avoid such studies at this time  May consider radiofrequency rhizolysis or intraspinal procedures pending response to present treatment and F/U evaluation . We will avoid considering these procedures at this time  Patient to call Pain Management Center should patient have concerns prior to scheduled return appointment.

## 2016-02-04 NOTE — Progress Notes (Signed)
   Subjective:    Patient ID: Margaret Shepard, female    DOB: 05/13/1961, 55 y.o.   MRN: [id number]  HPI  The patient is a 55 year old female who returns to pain management for further evaluation and treatment of pain involving the neck upper extremity regions entire back and lower extremity region. The patient is status post surgical intervention of the cervical region with total hip replacements as well. The patient denies any trauma change in events of daily living the call significant change in symptomatology and states that the present time her pain is fairly well-controlled. The patient continues medications consisting of Neurontin Flexeril Valium oxycodone and OxyContin. The patient states the pain is aggravated with walking and standing. The patient also has decreased grip strength of the left upper extremity compared to the right upper extremity. This present time patient states the pain is fairly well-controlled and we will continue presently prescribed medications      Review of Systems     Objective:   Physical Exam  There was tenderness of the splenius capitis and occipitalis musculature regions of of moderate degree. Palpation of the acromioclavicular and glenohumeral joint regions reproduce moderate discomfort on the right compared to the left. The patient appeared to be with unremarkable Spurling's maneuver. The patient was with decreased grip strength on the left compared to the right. Tennis of the cervical facet and thoracic facet regions of moderate degree. Palpation over the thoracic musculature region reveal patient to be with moderately severe muscle spasms of the mid and lower thoracic regions. Palpation over the lumbar paraspinal musculature region lumbar facet region was with moderate tends to palpation of moderately severe tenderness to palpation and muscle spasms as well. There was tenderness over the PSIS and PII S region a moderately severe degree. Straight leg  raising was tolerates approximately 20 without a definite increased pain with dorsiflexion noted. There was decreased range of motion of the hips with no definite sensory deficit or dermatomal dystrophy of the lower extremities noted. DTRs appeared to be trace at the knees. 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 May consider pending follow-up evaluations as discussed   F/U neurological evaluation. May consider PNCV/EMG studies and other studies pending follow-up evaluations. We will avoid such studies at this time  May consider radiofrequency rhizolysis or intraspinal procedures pending response to present treatment and F/U evaluation . We will avoid considering these procedures at this time  Patient to call Pain Management Center should patient have concerns prior to scheduled return appointment.

## 2016-02-04 NOTE — Progress Notes (Signed)
Safety precautions to be maintained throughout the outpatient stay will include: orient to surroundings, keep bed in low position, maintain call bell within reach at all times, provide assistance with transfer out of bed and ambulation.  

## 2016-03-08 ENCOUNTER — Encounter: Payer: Self-pay | Admitting: Pain Medicine

## 2016-03-08 ENCOUNTER — Ambulatory Visit: Payer: Medicaid Other | Attending: Pain Medicine | Admitting: Pain Medicine

## 2016-03-08 VITALS — BP 130/93 | HR 100 | Temp 98.2°F | Resp 18 | Ht 65.0 in | Wt 137.0 lb

## 2016-03-08 DIAGNOSIS — M5136 Other intervertebral disc degeneration, lumbar region: Secondary | ICD-10-CM | POA: Insufficient documentation

## 2016-03-08 DIAGNOSIS — Z96649 Presence of unspecified artificial hip joint: Secondary | ICD-10-CM | POA: Diagnosis not present

## 2016-03-08 DIAGNOSIS — M5030 Other cervical disc degeneration, unspecified cervical region: Secondary | ICD-10-CM | POA: Insufficient documentation

## 2016-03-08 DIAGNOSIS — M5481 Occipital neuralgia: Secondary | ICD-10-CM | POA: Diagnosis not present

## 2016-03-08 DIAGNOSIS — G588 Other specified mononeuropathies: Secondary | ICD-10-CM | POA: Insufficient documentation

## 2016-03-08 DIAGNOSIS — M1288 Other specific arthropathies, not elsewhere classified, other specified site: Secondary | ICD-10-CM | POA: Diagnosis not present

## 2016-03-08 DIAGNOSIS — M47816 Spondylosis without myelopathy or radiculopathy, lumbar region: Secondary | ICD-10-CM | POA: Insufficient documentation

## 2016-03-08 DIAGNOSIS — M79606 Pain in leg, unspecified: Secondary | ICD-10-CM | POA: Diagnosis present

## 2016-03-08 DIAGNOSIS — Z9889 Other specified postprocedural states: Secondary | ICD-10-CM | POA: Diagnosis not present

## 2016-03-08 DIAGNOSIS — M533 Sacrococcygeal disorders, not elsewhere classified: Secondary | ICD-10-CM

## 2016-03-08 DIAGNOSIS — M545 Low back pain: Secondary | ICD-10-CM | POA: Diagnosis present

## 2016-03-08 DIAGNOSIS — M5126 Other intervertebral disc displacement, lumbar region: Secondary | ICD-10-CM | POA: Diagnosis not present

## 2016-03-08 DIAGNOSIS — Z96642 Presence of left artificial hip joint: Secondary | ICD-10-CM

## 2016-03-08 DIAGNOSIS — Z981 Arthrodesis status: Secondary | ICD-10-CM

## 2016-03-08 DIAGNOSIS — M542 Cervicalgia: Secondary | ICD-10-CM | POA: Diagnosis present

## 2016-03-08 DIAGNOSIS — M62830 Muscle spasm of back: Secondary | ICD-10-CM | POA: Diagnosis not present

## 2016-03-08 MED ORDER — DIAZEPAM 5 MG PO TABS: ORAL_TABLET | ORAL | Status: DC

## 2016-03-08 MED ORDER — OXYCODONE HCL 10 MG PO TABS: ORAL_TABLET | ORAL | Status: DC

## 2016-03-08 MED ORDER — OXYCODONE HCL ER 30 MG PO T12A: EXTENDED_RELEASE_TABLET | ORAL | Status: DC

## 2016-03-08 NOTE — Patient Instructions (Addendum)
PLAN   Continue present medication Neurontin Flexeril  oxycodone and OxyContin. Please decrease the use of Valium and attempt to discontinue use of Valium as discussed   F/U PCP at Urgent Care as we discussed for evaliation of  BP and general medical  condition., Repeat blood pressure was reviewed and discussed. Patient is to follow-up with primary care physician as discussed for further evaluation and treatment of blood pressure  F/U surgical evaluation. May consider pending follow-up evaluations as discussed   F/U neurological evaluation. May consider PNCV/EMG studies and other studies pending follow-up evaluations. We will avoid such studies at this time  Ask nurses and secretary the date of your psych evaluation with Dr.Faheem  to address modification of medications (decreasing and discontinuing the use of Valium ) as we discussed today  May consider radiofrequency rhizolysis or intraspinal procedures pending response to present treatment and F/U evaluation . We will avoid considering these procedures at this time  Patient to call Pain Management Center should patient have concerns prior to scheduled return appointment.

## 2016-03-08 NOTE — Progress Notes (Signed)
Safety precautions to be maintained throughout the outpatient stay will include: orient to surroundings, keep bed in low position, maintain call bell within reach at all times, provide assistance with transfer out of bed and ambulation.  

## 2016-03-08 NOTE — Progress Notes (Signed)
Subjective:    Patient ID: Margaret Shepard, female    DOB: 09/12/1961, 55 y.o.   MRN: [id number]  HPI  The patient is a 55 year old female who returns to pain management for further evaluation and treatment of pain involving the neck upper extremity region as well as the lower back and lower extremity region. The patient is status post total hip replacement as well as surgery of the cervical region. On today's visit we discussed patient's condition and patient is with pain fairly well-controlled at this time. We also informed patient that patient would need to decrease and discontinue the use of Valium. We discussed patient undergoing psych evaluation to be considered for modification of her medications. The patient will proceed with psych evaluation as discussed. The patient will follow-up with her primary care physician to have primary care physician refer patient to psychiatry since we are unable to do such due to patient's insurance. We will continue medications at this time consisting of Neurontin Flexeril and oxycodone and OxyContin. We will decrease the use of Valium and have informed patient that we we'll taper and discontinue the use of Valium and that she is to proceed with psych evaluation for modification of her medications as explained to patient. All agreed to suggested treatment plan      Review of Systems     Objective:   Physical Exam  There was tenderness of the splenius capitis and occipitalis musculature regions palpation which be produced moderate discomfort. There was well-healed scar of the cervical region without increased warmth and erythema in the region of the scar. There was tenderness over the thoracic facet thoracic paraspinal musculature region and there was what appeared to be unremarkable Spurling's maneuver. Palpation of the acromioclavicular and glenohumeral joint regions reproduce moderate discomfort. The patient was with decreased grip strength on the left  compared to the right. Tinel and Phalen's maneuver were without increased pain of significant degree. Palpation over the thoracic region thoracic facet region was without crepitus of the thoracic region and was with evidence of moderate muscle spasm. Palpation over the lumbar paraspinal musculature region lumbar facet region was attends to palpation of moderate degree with lateral bending rotation extension and palpation over the lumbar facets reproducing moderate discomfort there was moderate tenderness over the PSIS and PII S region as well as the gluteal and piriformis musculature region with mild tenderness to moderate tenderness of the greater trochanteric region iliotibial band region. Straight leg raise was tolerates approximately 30 without increased pain with dorsiflexion noted. EHL strength appeared to be decreased. No definite sensory deficit or dermatomal distribution detected. DTRs were difficult to elicit appeared to be trace at the knees. There was negative clonus negative Homans. 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 Please decrease the use of Valium and attempt to discontinue use of Valium as discussed   F/U PCP at Urgent Care as we discussed for evaliation of  BP and general medical  condition., Repeat blood pressure was reviewed  and discussed. Patient is to follow-up with primary care physician as discussed for  further evaluation and treatment of blood pressure Repeat blood pressure was 130/93  F/U surgical evaluation. May consider pending follow-up evaluations as discussed   F/U neurological evaluation. May consider PNCV/EMG studies and other studies pending follow-up evaluations. We will avoid such studies at this time  Ask nurses and secretary the date of your psych evaluation with Dr.Faheem  to address modification of medications (decreasing and discontinuing the use of Valium ) as we discussed today  May consider radiofrequency rhizolysis or intraspinal procedures pending response to present treatment and F/U evaluation . We will avoid considering these procedures at this time  Patient to call Pain Management Center should patient have concerns prior to scheduled return appointment.

## 2016-03-20 NOTE — Progress Notes (Signed)
Quick Note:  Reviewed. ______ 

## 2016-03-22 LAB — TOXASSURE SELECT 13 (MW), URINE: PDF: 0

## 2016-03-22 NOTE — Progress Notes (Signed)
Quick Note:  Reviewed. ______ 

## 2016-04-05 ENCOUNTER — Encounter: Payer: Self-pay | Admitting: Pain Medicine

## 2016-04-05 ENCOUNTER — Ambulatory Visit: Payer: Medicaid Other | Attending: Pain Medicine | Admitting: Pain Medicine

## 2016-04-05 VITALS — BP 130/80 | HR 86 | Temp 98.0°F | Resp 16 | Ht 65.0 in | Wt 138.0 lb

## 2016-04-05 DIAGNOSIS — Z981 Arthrodesis status: Secondary | ICD-10-CM

## 2016-04-05 DIAGNOSIS — M5136 Other intervertebral disc degeneration, lumbar region: Secondary | ICD-10-CM | POA: Insufficient documentation

## 2016-04-05 DIAGNOSIS — M5126 Other intervertebral disc displacement, lumbar region: Secondary | ICD-10-CM | POA: Insufficient documentation

## 2016-04-05 DIAGNOSIS — Z96642 Presence of left artificial hip joint: Secondary | ICD-10-CM

## 2016-04-05 DIAGNOSIS — M5481 Occipital neuralgia: Secondary | ICD-10-CM | POA: Diagnosis not present

## 2016-04-05 DIAGNOSIS — M5030 Other cervical disc degeneration, unspecified cervical region: Secondary | ICD-10-CM | POA: Insufficient documentation

## 2016-04-05 DIAGNOSIS — G588 Other specified mononeuropathies: Secondary | ICD-10-CM | POA: Diagnosis not present

## 2016-04-05 DIAGNOSIS — M47816 Spondylosis without myelopathy or radiculopathy, lumbar region: Secondary | ICD-10-CM | POA: Diagnosis not present

## 2016-04-05 DIAGNOSIS — M47812 Spondylosis without myelopathy or radiculopathy, cervical region: Secondary | ICD-10-CM | POA: Diagnosis not present

## 2016-04-05 DIAGNOSIS — M545 Low back pain: Secondary | ICD-10-CM | POA: Diagnosis present

## 2016-04-05 DIAGNOSIS — M533 Sacrococcygeal disorders, not elsewhere classified: Secondary | ICD-10-CM

## 2016-04-05 DIAGNOSIS — M542 Cervicalgia: Secondary | ICD-10-CM | POA: Diagnosis present

## 2016-04-05 DIAGNOSIS — M62830 Muscle spasm of back: Secondary | ICD-10-CM | POA: Diagnosis not present

## 2016-04-05 MED ORDER — OXYCODONE HCL ER 30 MG PO T12A: EXTENDED_RELEASE_TABLET | ORAL | Status: DC

## 2016-04-05 MED ORDER — DIAZEPAM 5 MG PO TABS: ORAL_TABLET | ORAL | Status: DC

## 2016-04-05 MED ORDER — OXYCODONE HCL 10 MG PO TABS: ORAL_TABLET | ORAL | Status: DC

## 2016-04-05 NOTE — Progress Notes (Signed)
Safety precautions to be maintained throughout the outpatient stay will include: orient to surroundings, keep bed in low position, maintain call bell within reach at all times, provide assistance with transfer out of bed and ambulation.  

## 2016-04-05 NOTE — Progress Notes (Signed)
Subjective:    Patient ID: Margaret Shepard, female    DOB: 02/10/1961, 55 y.o.   MRN: [id number]  HPI  The patient is a 55 year old female who returns to pain management for further evaluation and treatment of pain involving the region of the neck upper extremity regions mid back lower back and lower extremity region. The patient states the pain is fairly well-controlled at this time. The patient denied any trauma change in events of daily living the call significant change in symptomatology. We discussed patient's condition and will continue medications at this time conventional treatment. The patient is with ability to perform most activities of daily living without expansion severe disabling pain. The patient is looking forward to going to the beach later this summer. We will continue medications as prescribed this time and we will remain available to consider modification of treatment pending response to treatment and follow-up evaluation. All agreed to suggested treatment plan  Review of Systems     Objective:   Physical Exam  There was tenderness to palpation of the paraspinal must reason cervical and cervical facet region with well-healed surgical scar of the cervical region noted. There was tenderness of the splenius capitis and occipitalis region a moderate degree as well palpation of the acromioclavicular and glenohumeral joint regions reproduce mild discomfort and patient appeared to be with unremarkable Spurling's maneuver. The patient was with decreased grip strength on the left compared to the right. Tinel and Phalen's maneuver were without increase of pain of significant degree. There was moderate tenderness to palpation of the thoracic paraspinal musculature region with no crepitus of the thoracic region noted. Palpation over the lumbar paraspinal musculatures and lumbar facet region associated with moderate to severe discomfort with lateral bending rotation extension and palpation  over the lumbar facets reproducing moderately severe discomfort. Straight leg raise was limited to 20 without increased pain with dorsiflexion noted. The patient was with mild difficulty attending to stand on tiptoes and heels. Palpation of the PSIS and PII S region reproduces moderate discomfort with moderate tenderness of the greater trochanteric region iliotibial band region. No definite sensory deficit or dermatomal dystrophy detected. There was negative clonus negative Homans. 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 Please decrease the use of Valium and attempt to discontinue use of Valium as discussed   F/U PCP at Urgent Care as we discussed for evaliation of  BP and general medical  condition.,   F/U surgical evaluation . Patient prefers to avoid surgical reevaluation at this time  F/U neurological evaluation. May consider PNCV/EMG studies and other studies pending follow-up evaluation  Ask nurses and secretary the date of your psych evaluation with Dr.Faheem  to address modification of medications (decreasing and discontinuing the use of Valium ) as we discussed today  May consider radiofrequency rhizolysis or intraspinal procedures pending response to present treatment and F/U evaluation . We will avoid considering these procedures at this time  Patient to  call Pain Management Center  should patient have concerns prior to scheduled return appointment

## 2016-04-05 NOTE — Patient Instructions (Addendum)
PLAN   Continue present medication Neurontin Flexeril  oxycodone and OxyContin. Please decrease the use of Valium and attempt to discontinue use of Valium as discussed   F/U PCP at Urgent Care as we discussed for evaliation of  BP and general medical  condition.,   F/U surgical evaluation . Patient prefers to avoid surgical reevaluation at this time  F/U neurological evaluation. May consider PNCV/EMG studies and other studies pending follow-up evaluation  Ask nurses and secretary the date of your psych evaluation with Dr.Faheem  to address modification of medications (decreasing and discontinuing the use of Valium ) as we discussed today  May consider radiofrequency rhizolysis or intraspinal procedures pending response to present treatment and F/U evaluation . We will avoid considering these procedures at this time  Patient to call Pain Management Center should patient have concerns prior to scheduled return appointment.Pain Management Discharge Instructions  General Discharge Instructions :  If you need to reach your doctor call: Monday-Friday 8:00 am - 4:00 pm at [phone] or toll free [phone].  After clinic hours [phone] to have operator reach doctor.  Bring all of your medication bottles to all your appointments in the pain clinic.  To cancel or reschedule your appointment with Pain Management please remember to call 24 hours in advance to avoid a fee.  Refer to the educational materials which you have been given on: General Risks, I had my Procedure. Discharge Instructions, Post Sedation.  Post Procedure Instructions:  The drugs you were given will stay in your system until tomorrow, so for the next 24 hours you should not drive, make any legal decisions or drink any alcoholic beverages.  You may eat anything you prefer, but it is better to start with liquids then soups and crackers, and gradually work up to solid foods.  Please notify your doctor immediately if  you have any unusual bleeding, trouble breathing or pain that is not related to your normal pain.  Depending on the type of procedure that was done, some parts of your body may feel week and/or numb.  This usually clears up by tonight or the next day.  Walk with the use of an assistive device or accompanied by an adult for the 24 hours.  You may use ice on the affected area for the first 24 hours.  Put ice in a Ziploc bag and cover with a towel and place against area 15 minutes on 15 minutes off.  You may switch to heat after 24 hours.

## 2016-04-08 ENCOUNTER — Telehealth: Payer: Self-pay | Admitting: *Deleted

## 2016-04-08 NOTE — Telephone Encounter (Signed)
Pt would like for someone to give her a call about her prescription at the drug store. She stated that [facility] fax a consent form. Please give the patient a call...thanks

## 2016-04-08 NOTE — Telephone Encounter (Signed)
Needs PA for Oxycontin.

## 2016-04-13 NOTE — Telephone Encounter (Signed)
Pt would like for Dena to give her a call concerning her scripts

## 2016-04-13 NOTE — Telephone Encounter (Signed)
Called patient to inform her that Oxycodone has been approved.

## 2016-04-14 ENCOUNTER — Telehealth: Payer: Self-pay | Admitting: Pain Medicine

## 2016-04-14 NOTE — Telephone Encounter (Signed)
Spoke with Maggy at [facility] re; Prior Authorization.  Clarification of medication made, will resend PA for Oxycontin 30 mg 12 hour tablet.

## 2016-04-14 NOTE — Telephone Encounter (Signed)
[facility] called, confirmation number given.

## 2016-04-14 NOTE — Telephone Encounter (Signed)
Patient still not able to pick up medication, prior auth not going thru to [facility], states Margaret JanDena was working on this, and would she please fax the prior auth to [facility].call patient to let her know when she can pick up meds.

## 2016-04-14 NOTE — Telephone Encounter (Signed)
The wrong prior auth was put in for Celinda p it should be oxycontin and not oxycodone please call Maggy at [facility] [phone]

## 2016-04-14 NOTE — Telephone Encounter (Signed)
Called pharmacy and PA resent for Oxycontin 30 mg 12 hour tablets

## 2016-04-18 ENCOUNTER — Telehealth: Payer: Self-pay

## 2016-04-18 ENCOUNTER — Telehealth: Payer: Self-pay | Admitting: *Deleted

## 2016-04-18 NOTE — Telephone Encounter (Signed)
Pt lvm asking if someone could please call [facility] and get her meds straighten out. thanks

## 2016-04-18 NOTE — Telephone Encounter (Signed)
Could someone please do pre auth for pts meds she has called and the pharmacy has to.

## 2016-04-18 NOTE — Telephone Encounter (Signed)
Left message for patient to call office.  

## 2016-05-05 ENCOUNTER — Ambulatory Visit: Payer: Medicaid Other | Attending: Pain Medicine | Admitting: Pain Medicine

## 2016-05-05 ENCOUNTER — Encounter: Payer: Self-pay | Admitting: Pain Medicine

## 2016-05-05 VITALS — BP 138/93 | HR 98 | Temp 98.3°F | Resp 18 | Ht 65.0 in | Wt 136.0 lb

## 2016-05-05 DIAGNOSIS — Z981 Arthrodesis status: Secondary | ICD-10-CM

## 2016-05-05 DIAGNOSIS — M545 Low back pain: Secondary | ICD-10-CM | POA: Insufficient documentation

## 2016-05-05 DIAGNOSIS — M5030 Other cervical disc degeneration, unspecified cervical region: Secondary | ICD-10-CM | POA: Diagnosis not present

## 2016-05-05 DIAGNOSIS — M5126 Other intervertebral disc displacement, lumbar region: Secondary | ICD-10-CM | POA: Insufficient documentation

## 2016-05-05 DIAGNOSIS — M47812 Spondylosis without myelopathy or radiculopathy, cervical region: Secondary | ICD-10-CM | POA: Diagnosis not present

## 2016-05-05 DIAGNOSIS — G588 Other specified mononeuropathies: Secondary | ICD-10-CM | POA: Insufficient documentation

## 2016-05-05 DIAGNOSIS — M5481 Occipital neuralgia: Secondary | ICD-10-CM | POA: Diagnosis not present

## 2016-05-05 DIAGNOSIS — M47816 Spondylosis without myelopathy or radiculopathy, lumbar region: Secondary | ICD-10-CM | POA: Diagnosis not present

## 2016-05-05 DIAGNOSIS — M533 Sacrococcygeal disorders, not elsewhere classified: Secondary | ICD-10-CM

## 2016-05-05 DIAGNOSIS — M62830 Muscle spasm of back: Secondary | ICD-10-CM | POA: Diagnosis not present

## 2016-05-05 DIAGNOSIS — Z96642 Presence of left artificial hip joint: Secondary | ICD-10-CM

## 2016-05-05 DIAGNOSIS — M542 Cervicalgia: Secondary | ICD-10-CM | POA: Diagnosis present

## 2016-05-05 DIAGNOSIS — M5136 Other intervertebral disc degeneration, lumbar region: Secondary | ICD-10-CM | POA: Diagnosis not present

## 2016-05-05 MED ORDER — GABAPENTIN 300 MG PO CAPS: ORAL_CAPSULE | ORAL | 2 refills | Status: DC

## 2016-05-05 MED ORDER — CYCLOBENZAPRINE HCL 5 MG PO TABS: ORAL_TABLET | ORAL | 2 refills | Status: DC

## 2016-05-05 MED ORDER — OXYCODONE HCL 10 MG PO TABS: ORAL_TABLET | ORAL | 0 refills | Status: DC

## 2016-05-05 MED ORDER — DIAZEPAM 5 MG PO TABS: ORAL_TABLET | ORAL | 0 refills | Status: DC

## 2016-05-05 MED ORDER — OXYCODONE HCL ER 30 MG PO T12A: EXTENDED_RELEASE_TABLET | ORAL | 0 refills | Status: DC

## 2016-05-05 NOTE — Progress Notes (Signed)
Safety precautions to be maintained throughout the outpatient stay will include: orient to surroundings, keep bed in low position, maintain call bell within reach at all times, provide assistance with transfer out of bed and ambulation.  

## 2016-05-05 NOTE — Progress Notes (Signed)
       The patient is a 55 year old female who returns to pain management for further evaluation and treatment of pain involving the neck upper extremity region as well as the lower back and lower extremity regions. Patient states pain is fairly well-controlled at this time. Patient denies any trauma change in events of daily living the call significant change in symptomatology. We discussed patient's condition on today's visit and we will continue medications as prescribed and patient will follow-up with psychiatrist regarding decreasing and discontinuing Xanax. We will consider additional modifications of treatment pending response to treatment and follow-up evaluation. All agreed to suggested treatment plan     Physical examination  There was tenderness of the splenius capitis and occipitalis region palpation which reproduces mild to moderate discomfort. There was mild to moderate tenderness of the cervical facet and cervical paraspinal musculature region with palpation over the lumbar facet lumbar paraspinal musculatures reproducing moderate to moderately severe discomfort. The patient appeared to be with decreased grip strength on the left compared to the right. There was tenderness over the region of the thoracic facet provocative crepitus of the thoracic region noted. Palpation over the lumbar region was increase of pain with lateral bending rotation extension and palpation over the lumbar facets region there was decreased sensation of the lower extremities with negative clonus negative Homans. No sensory deficit or dermatomal distribution detected. Abdomen nontender with no costovertebral tenderness noted.       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 Please decrease the use of Valium and attempt to discontinue use of Valium as discussed   F/U PCP at Urgent Care as we discussed for evaliation of  BP and general medical  Condition as previously discussed  F/U surgical evaluation . Patient prefers to avoid surgical reevaluation at this time  F/U neurological evaluation. May consider PNCV/EMG studies and other studies pending follow-up evaluation  Ask nurses and secretary the date of your psych evaluation with Dr.Faheem  to address modification of medications (decreasing and discontinuing the use of Valium ) as we discussed   May consider radiofrequency rhizolysis or intraspinal procedures pending response to present treatment and F/U evaluation . We will avoid considering these procedures at this time  Patient to call Pain Management Center should patient have concerns prior to scheduled return appointment.

## 2016-05-05 NOTE — Patient Instructions (Signed)
PLAN   Continue present medication Neurontin Flexeril  oxycodone and OxyContin. Please decrease the use of Valium and attempt to discontinue use of Valium as discussed   F/U PCP at Urgent Care as we discussed for evaliation of  BP and general medical  Condition as previously discussed  F/U surgical evaluation . Patient prefers to avoid surgical reevaluation at this time  F/U neurological evaluation. May consider PNCV/EMG studies and other studies pending follow-up evaluation  Ask nurses and secretary the date of your psych evaluation with Dr.Faheem  to address modification of medications (decreasing and discontinuing the use of Valium ) as we discussed   May consider radiofrequency rhizolysis or intraspinal procedures pending response to present treatment and F/U evaluation . We will avoid considering these procedures at this time  Patient to call Pain Management Center should patient have concerns prior to scheduled return appointment.

## 2016-06-09 ENCOUNTER — Ambulatory Visit: Payer: Medicaid Other | Attending: Pain Medicine | Admitting: Pain Medicine

## 2016-06-09 ENCOUNTER — Encounter: Payer: Self-pay | Admitting: Pain Medicine

## 2016-06-09 VITALS — BP 141/87 | HR 80 | Temp 97.6°F | Resp 16 | Ht 65.0 in | Wt 138.0 lb

## 2016-06-09 DIAGNOSIS — Z96643 Presence of artificial hip joint, bilateral: Secondary | ICD-10-CM | POA: Insufficient documentation

## 2016-06-09 DIAGNOSIS — M5481 Occipital neuralgia: Secondary | ICD-10-CM | POA: Insufficient documentation

## 2016-06-09 DIAGNOSIS — M533 Sacrococcygeal disorders, not elsewhere classified: Secondary | ICD-10-CM

## 2016-06-09 DIAGNOSIS — Z981 Arthrodesis status: Secondary | ICD-10-CM

## 2016-06-09 DIAGNOSIS — M5030 Other cervical disc degeneration, unspecified cervical region: Secondary | ICD-10-CM

## 2016-06-09 DIAGNOSIS — M545 Low back pain: Secondary | ICD-10-CM | POA: Diagnosis not present

## 2016-06-09 DIAGNOSIS — M62830 Muscle spasm of back: Secondary | ICD-10-CM | POA: Insufficient documentation

## 2016-06-09 DIAGNOSIS — M47812 Spondylosis without myelopathy or radiculopathy, cervical region: Secondary | ICD-10-CM | POA: Insufficient documentation

## 2016-06-09 DIAGNOSIS — M47816 Spondylosis without myelopathy or radiculopathy, lumbar region: Secondary | ICD-10-CM | POA: Insufficient documentation

## 2016-06-09 DIAGNOSIS — Z96642 Presence of left artificial hip joint: Secondary | ICD-10-CM

## 2016-06-09 DIAGNOSIS — M1288 Other specific arthropathies, not elsewhere classified, other specified site: Secondary | ICD-10-CM | POA: Diagnosis not present

## 2016-06-09 DIAGNOSIS — M79601 Pain in right arm: Secondary | ICD-10-CM | POA: Diagnosis present

## 2016-06-09 DIAGNOSIS — M5126 Other intervertebral disc displacement, lumbar region: Secondary | ICD-10-CM | POA: Insufficient documentation

## 2016-06-09 DIAGNOSIS — M542 Cervicalgia: Secondary | ICD-10-CM | POA: Diagnosis present

## 2016-06-09 DIAGNOSIS — M5136 Other intervertebral disc degeneration, lumbar region: Secondary | ICD-10-CM | POA: Insufficient documentation

## 2016-06-09 DIAGNOSIS — M79602 Pain in left arm: Secondary | ICD-10-CM | POA: Diagnosis present

## 2016-06-09 MED ORDER — OXYCODONE HCL ER 30 MG PO T12A: EXTENDED_RELEASE_TABLET | ORAL | 0 refills | Status: DC

## 2016-06-09 MED ORDER — OXYCODONE HCL 10 MG PO TABS: ORAL_TABLET | ORAL | 0 refills | Status: DC

## 2016-06-09 MED ORDER — CYCLOBENZAPRINE HCL 5 MG PO TABS: ORAL_TABLET | ORAL | 2 refills | Status: DC

## 2016-06-09 MED ORDER — GABAPENTIN 300 MG PO CAPS: ORAL_CAPSULE | ORAL | 2 refills | Status: DC

## 2016-06-09 NOTE — Progress Notes (Signed)
Patient here for medication management.   Patient would also like to have a refill on her Valium.  Safety precautions to be maintained throughout the outpatient stay will include: orient to surroundings, keep bed in low position, maintain call bell within reach at all times, provide assistance with transfer out of bed and ambulation.

## 2016-06-09 NOTE — Patient Instructions (Addendum)
PLAN   Continue present medication Neurontin Flexeril  oxycodone and OxyContin. Please decrease the use of Valium and attempt to discontinue use of Valium as we discussed   F/U PCP at Urgent Care as we discussed for evaluation of  BP and general medical condition as previously discussed  F/U surgical evaluation . Patient prefers to avoid surgical reevaluation at this time  F/U neurological evaluation. May consider PNCV/EMG studies and other studies pending follow-up evaluation  F/U psych evaluation  May consider radiofrequency rhizolysis or intraspinal procedures pending response to present treatment and F/U evaluation . We will avoid considering these procedures at this time  Patient to call Pain Management Center should patient have concerns prior to scheduled return appointment.

## 2016-06-09 NOTE — Progress Notes (Signed)
    The patient is a 55 year old female who returns to pain management for further assessment and treatment of pain involving the lumbar lower extremity region cervical and upper extremity regions. The patient recently was able to go to the patient stated that she was without any significant pain interfering with activities of daily living. The patient is with prior history of cervical surgery as well as total hip replacements. The patient denies any recent trauma change in events of daily living the call significant change in symptomatology. We will continue medications consisting of Neurontin Flexeril and oxycodone and OxyContin at this time. We will consider additional modifications of treatment regimen pending follow-up evaluations. The patient was with understanding and agreed to suggested treatment plan.     Physical examination   There was tenderness to palpation of paraspinal must reason cervical region cervical facet region palpation which be produced pain of mild-to-moderate degree with mild to moderate tenderness of the splenius capitis and occipitalis regions. Palpation over the region of the acromioclavicular and glenohumeral joint regions reproduce mild discomfort. Palpation over the cervical facet cervical paraspinal musculature region was attends to palpation of moderate degree with well-healed surgical scar of the cervical region without increased warmth and erythema in the region of the scar. There was decreased grip strength noted. Tinel and Phalen's maneuver were without increased pain of significant degree. Outpatient over the thoracic region was attends to palpation and evidence of significant muscle spasms involving the lower thoracic paraspinal musculature region and the lumbar paraspinal musculature region of moderate to moderately severe degree with no crepitus of the thoracic region noted. Palpation over the PSIS and PII S region reproduces moderate discomfort with tenderness  over the left and right regions reproducing moderate discomfort. The knees were attends to palpation without without increased warmth or erythema of the knees. There was crepitus of the knees noted. There was negative anterior and posterior drawer signs without ballottement of the patella noted. There was negative clonus negative Homans. No sensory deficit of dermatomal distribution detected. Abdomen nontender and no costovertebral tenderness noted.       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 Please decrease the use of Valium and attempt to discontinue use of Valium as we discussed   F/U PCP at Urgent Care as we discussed for evaluation of  BP and general medical condition as previously discussed  F/U surgical evaluation . Patient prefers to avoid surgical reevaluation at this time  F/U neurological evaluation. May consider PNCV/EMG studies and other studies pending follow-up evaluation  F/U psych evaluation as discussed  May consider radiofrequency rhizolysis or intraspinal procedures pending response to present treatment and F/U evaluation . We will avoid considering these procedures at this time  Patient to call [facility] should patient have concerns prior to scheduled return appointment.

## 2016-06-14 ENCOUNTER — Telehealth: Payer: Self-pay

## 2016-06-14 NOTE — Telephone Encounter (Signed)
Patient left voicemail stating that [facility] needs a prior auth on her script. You may have received a faxed request as well.

## 2016-06-15 NOTE — Telephone Encounter (Signed)
Spoke with pharmacist at [facility] and this PA for oxycodone HCL 10 mg was approved on 06/14/16 and patient is aware.

## 2016-06-20 ENCOUNTER — Telehealth: Payer: Self-pay

## 2016-06-20 NOTE — Telephone Encounter (Signed)
She said the pharmacy is faxing over a pre auth for her medication Dr. Metta Clinesrisp wrote her. Please call her when its done.

## 2016-06-22 ENCOUNTER — Telehealth: Payer: Self-pay

## 2016-06-22 NOTE — Telephone Encounter (Signed)
Patient called and left another vm regarding her pre auth for oxycontin. She has not heard anything from us and would like to be called.

## 2016-06-22 NOTE — Telephone Encounter (Signed)
PA sent on 06/22/16, [place] [facility]

## 2016-06-22 NOTE — Telephone Encounter (Signed)
PA sent for oxycontin PA.

## 2016-06-23 ENCOUNTER — Telehealth: Payer: Self-pay

## 2016-06-23 NOTE — Telephone Encounter (Signed)
Called patient; prior authorization sent again today and no response from them as of this afternoon 1505 hrs.

## 2016-06-23 NOTE — Telephone Encounter (Addendum)
States her pre Berkley Harveyauth has still not gone through. Please call her oxycontin 30 mg

## 2016-06-27 NOTE — Telephone Encounter (Signed)
Spoke with Aram BeechamCynthia to let her know that I resent the PA today, 06/27/16 through [employer] [employer] and she was also given the pharmacy call center number along with the key for her PA request.

## 2016-06-27 NOTE — Telephone Encounter (Signed)
Patient called and left another voicemail Friday evening. She called the medicaid number and they told her there was no record of a prior auth being sent in. She is almost out of her medicine and is concerned about this. Please call her.

## 2016-06-29 ENCOUNTER — Telehealth: Payer: Self-pay | Admitting: *Deleted

## 2016-06-29 NOTE — Telephone Encounter (Signed)
The patient called back again, and left a vm asking to be called. She said she called medicaid and they said they cannot tell her anything. She is very worried about this situation and wants someone to call her. This has been going on since last week and with no results.

## 2016-06-29 NOTE — Telephone Encounter (Signed)
Spoke with patient re; PA and that we have not gotten an answer.  Informed patient that I did talk to [employer] representative today and they do not have an answer.  Patient asked if would refax the request and call them back tomorrow. Agreed that I would do that.

## 2016-06-30 NOTE — Telephone Encounter (Signed)
PA resent for high dose oxycontin, patient aware to check with her pharmacy to see if approved.  Patient verbaizes u/o information.

## 2016-07-13 ENCOUNTER — Telehealth: Payer: Self-pay | Admitting: Pain Medicine

## 2016-07-13 NOTE — Telephone Encounter (Signed)
Prior auth sent for oxycodone today. Patient called and left VM about same.

## 2016-07-13 NOTE — Telephone Encounter (Signed)
Received prior auth for meds written when Dr. Metta Clinesrisp was still in office, Are we still doing this and if not please notify patient at [phone] along with pharmacy

## 2016-07-15 ENCOUNTER — Telehealth: Payer: Self-pay | Admitting: *Deleted

## 2016-07-18 ENCOUNTER — Telehealth: Payer: Self-pay | Admitting: *Deleted

## 2016-07-18 NOTE — Telephone Encounter (Signed)
refaxed PA to [employer], corrected recipient ID and made pharmacy and patient aware.

## 2016-07-18 NOTE — Telephone Encounter (Signed)
Spoke with pharmacist at [facility] to let them know that we have resent the PA with a corrected recipient ID and also let the patient know this information.  She said she will check with pharmacy in the morning.

## 2016-07-18 NOTE — Telephone Encounter (Signed)
Patient called and said the pharmacist said you need to run her prior auth thru again and please call him if you get an approval. It costs the pharmacy every time they do an inquiry. Also, patient wants you to call her before you leave today.

## 2016-07-19 ENCOUNTER — Telehealth: Payer: Self-pay | Admitting: *Deleted

## 2016-07-19 NOTE — Telephone Encounter (Signed)
Spoke with patient re; PA that was initially sent on 07/13/16 and also resent on 07/18/16.  [facility], representative informed me that there was a question that was checked in correctly on form and the form would have to be resent.  Form filled out again and [facility] PA of oxycodone 10 mg qty 180 for 180 days.  Patient will check with pharmacy tomorrow.

## 2016-07-21 ENCOUNTER — Telehealth: Payer: Self-pay | Admitting: Pain Medicine

## 2016-07-21 ENCOUNTER — Telehealth: Payer: Self-pay

## 2016-07-21 NOTE — Telephone Encounter (Signed)
Voicemail left with patient that I have sent another PA as a high dose PA and completed form as representative [facility] instructed.

## 2016-07-21 NOTE — Telephone Encounter (Signed)
Returned patient's call re; PA for oxycodone 10 mg and that we sent again this morning as a high dose medication d/t the combination of drugs that she is taking. Patient will check with me in the morning as to the status of this PA.

## 2016-07-21 NOTE — Telephone Encounter (Signed)
Pharmacy told patient her authorization still wont process, something about high dosage needs to be checked, please call pharmacy to find out what needs to be done.

## 2016-07-21 NOTE — Telephone Encounter (Signed)
The pharmacy called, I believe his name was Sam, and said that when trying to get pre auth for her meds you did not get authorization for the high dose oxycodone. It approved the wrong drug. Please redo this prior authorization for the right one. If you have any questions please call him. The patient called and left two voice mails regarding this. She is very anxious to get her medicine. Please call her and let her know what is going on.

## 2016-07-22 ENCOUNTER — Telehealth: Payer: Self-pay | Admitting: *Deleted

## 2016-07-22 NOTE — Telephone Encounter (Signed)
Attempted to call patient and let her know that PA for oxycodone has been approved.  Left message for patient to call.

## 2016-07-25 NOTE — Telephone Encounter (Signed)
Voicemail left with patient returning her call.  

## 2016-07-25 NOTE — Telephone Encounter (Signed)
Patient called and left another vm for someone to call her

## 2016-08-14 ENCOUNTER — Other Ambulatory Visit: Payer: Self-pay | Admitting: Pain Medicine

## 2018-04-10 DIAGNOSIS — M5136 Other intervertebral disc degeneration, lumbar region: Secondary | ICD-10-CM | POA: Diagnosis not present

## 2018-04-10 DIAGNOSIS — M5030 Other cervical disc degeneration, unspecified cervical region: Secondary | ICD-10-CM | POA: Diagnosis not present

## 2018-04-10 DIAGNOSIS — G894 Chronic pain syndrome: Secondary | ICD-10-CM | POA: Diagnosis not present

## 2018-04-10 DIAGNOSIS — Z5181 Encounter for therapeutic drug level monitoring: Secondary | ICD-10-CM | POA: Diagnosis not present

## 2018-05-08 DIAGNOSIS — M5136 Other intervertebral disc degeneration, lumbar region: Secondary | ICD-10-CM | POA: Diagnosis not present

## 2018-05-08 DIAGNOSIS — M5030 Other cervical disc degeneration, unspecified cervical region: Secondary | ICD-10-CM | POA: Diagnosis not present

## 2018-05-08 DIAGNOSIS — G894 Chronic pain syndrome: Secondary | ICD-10-CM | POA: Diagnosis not present

## 2018-05-08 DIAGNOSIS — Z5181 Encounter for therapeutic drug level monitoring: Secondary | ICD-10-CM | POA: Diagnosis not present

## 2018-06-05 DIAGNOSIS — M5030 Other cervical disc degeneration, unspecified cervical region: Secondary | ICD-10-CM | POA: Diagnosis not present

## 2018-06-05 DIAGNOSIS — M5136 Other intervertebral disc degeneration, lumbar region: Secondary | ICD-10-CM | POA: Diagnosis not present

## 2018-06-05 DIAGNOSIS — G894 Chronic pain syndrome: Secondary | ICD-10-CM | POA: Diagnosis not present

## 2018-06-05 DIAGNOSIS — Z5181 Encounter for therapeutic drug level monitoring: Secondary | ICD-10-CM | POA: Diagnosis not present

## 2018-07-03 DIAGNOSIS — M5030 Other cervical disc degeneration, unspecified cervical region: Secondary | ICD-10-CM | POA: Diagnosis not present

## 2018-07-03 DIAGNOSIS — Z5181 Encounter for therapeutic drug level monitoring: Secondary | ICD-10-CM | POA: Diagnosis not present

## 2018-07-03 DIAGNOSIS — G894 Chronic pain syndrome: Secondary | ICD-10-CM | POA: Diagnosis not present

## 2018-07-03 DIAGNOSIS — M5136 Other intervertebral disc degeneration, lumbar region: Secondary | ICD-10-CM | POA: Diagnosis not present

## 2018-07-31 DIAGNOSIS — M5136 Other intervertebral disc degeneration, lumbar region: Secondary | ICD-10-CM | POA: Diagnosis not present

## 2018-07-31 DIAGNOSIS — Z5181 Encounter for therapeutic drug level monitoring: Secondary | ICD-10-CM | POA: Diagnosis not present

## 2018-07-31 DIAGNOSIS — G894 Chronic pain syndrome: Secondary | ICD-10-CM | POA: Diagnosis not present

## 2018-07-31 DIAGNOSIS — M5030 Other cervical disc degeneration, unspecified cervical region: Secondary | ICD-10-CM | POA: Diagnosis not present

## 2018-08-16 DIAGNOSIS — Z5181 Encounter for therapeutic drug level monitoring: Secondary | ICD-10-CM | POA: Diagnosis not present

## 2018-08-16 DIAGNOSIS — M5030 Other cervical disc degeneration, unspecified cervical region: Secondary | ICD-10-CM | POA: Diagnosis not present

## 2018-08-16 DIAGNOSIS — G894 Chronic pain syndrome: Secondary | ICD-10-CM | POA: Diagnosis not present

## 2018-08-16 DIAGNOSIS — M5136 Other intervertebral disc degeneration, lumbar region: Secondary | ICD-10-CM | POA: Diagnosis not present

## 2018-09-25 DIAGNOSIS — M5030 Other cervical disc degeneration, unspecified cervical region: Secondary | ICD-10-CM | POA: Diagnosis not present

## 2018-09-25 DIAGNOSIS — M5136 Other intervertebral disc degeneration, lumbar region: Secondary | ICD-10-CM | POA: Diagnosis not present

## 2018-09-25 DIAGNOSIS — G894 Chronic pain syndrome: Secondary | ICD-10-CM | POA: Diagnosis not present

## 2018-09-25 DIAGNOSIS — Z5181 Encounter for therapeutic drug level monitoring: Secondary | ICD-10-CM | POA: Diagnosis not present

## 2018-10-30 DIAGNOSIS — M5030 Other cervical disc degeneration, unspecified cervical region: Secondary | ICD-10-CM | POA: Diagnosis not present

## 2018-10-30 DIAGNOSIS — M5136 Other intervertebral disc degeneration, lumbar region: Secondary | ICD-10-CM | POA: Diagnosis not present

## 2018-10-30 DIAGNOSIS — Z5181 Encounter for therapeutic drug level monitoring: Secondary | ICD-10-CM | POA: Diagnosis not present

## 2018-10-30 DIAGNOSIS — G894 Chronic pain syndrome: Secondary | ICD-10-CM | POA: Diagnosis not present

## 2018-11-20 DIAGNOSIS — I10 Essential (primary) hypertension: Secondary | ICD-10-CM | POA: Diagnosis not present

## 2018-11-27 DIAGNOSIS — R51 Headache: Secondary | ICD-10-CM | POA: Diagnosis not present

## 2018-11-27 DIAGNOSIS — M47897 Other spondylosis, lumbosacral region: Secondary | ICD-10-CM | POA: Diagnosis not present

## 2018-11-27 DIAGNOSIS — M9951 Intervertebral disc stenosis of neural canal of cervical region: Secondary | ICD-10-CM | POA: Diagnosis not present

## 2018-11-27 DIAGNOSIS — M9931 Osseous stenosis of neural canal of cervical region: Secondary | ICD-10-CM | POA: Diagnosis not present

## 2018-11-27 DIAGNOSIS — G894 Chronic pain syndrome: Secondary | ICD-10-CM | POA: Diagnosis not present

## 2018-11-27 DIAGNOSIS — M792 Neuralgia and neuritis, unspecified: Secondary | ICD-10-CM | POA: Diagnosis not present

## 2018-11-27 DIAGNOSIS — G8929 Other chronic pain: Secondary | ICD-10-CM | POA: Diagnosis not present

## 2018-11-27 DIAGNOSIS — M79609 Pain in unspecified limb: Secondary | ICD-10-CM | POA: Diagnosis not present

## 2018-11-27 DIAGNOSIS — M48062 Spinal stenosis, lumbar region with neurogenic claudication: Secondary | ICD-10-CM | POA: Diagnosis not present

## 2018-12-25 DIAGNOSIS — M5030 Other cervical disc degeneration, unspecified cervical region: Secondary | ICD-10-CM | POA: Diagnosis not present

## 2018-12-25 DIAGNOSIS — M5412 Radiculopathy, cervical region: Secondary | ICD-10-CM | POA: Diagnosis not present

## 2018-12-25 DIAGNOSIS — M5136 Other intervertebral disc degeneration, lumbar region: Secondary | ICD-10-CM | POA: Diagnosis not present

## 2018-12-25 DIAGNOSIS — G894 Chronic pain syndrome: Secondary | ICD-10-CM | POA: Diagnosis not present

## 2019-01-22 DIAGNOSIS — M5030 Other cervical disc degeneration, unspecified cervical region: Secondary | ICD-10-CM | POA: Diagnosis not present

## 2019-01-22 DIAGNOSIS — M5136 Other intervertebral disc degeneration, lumbar region: Secondary | ICD-10-CM | POA: Diagnosis not present

## 2019-01-22 DIAGNOSIS — M5412 Radiculopathy, cervical region: Secondary | ICD-10-CM | POA: Diagnosis not present

## 2019-01-22 DIAGNOSIS — G894 Chronic pain syndrome: Secondary | ICD-10-CM | POA: Diagnosis not present

## 2019-02-19 DIAGNOSIS — M5136 Other intervertebral disc degeneration, lumbar region: Secondary | ICD-10-CM | POA: Diagnosis not present

## 2019-02-19 DIAGNOSIS — M9951 Intervertebral disc stenosis of neural canal of cervical region: Secondary | ICD-10-CM | POA: Diagnosis not present

## 2019-02-19 DIAGNOSIS — G8929 Other chronic pain: Secondary | ICD-10-CM | POA: Diagnosis not present

## 2019-02-19 DIAGNOSIS — M792 Neuralgia and neuritis, unspecified: Secondary | ICD-10-CM | POA: Diagnosis not present

## 2019-02-19 DIAGNOSIS — M9931 Osseous stenosis of neural canal of cervical region: Secondary | ICD-10-CM | POA: Diagnosis not present

## 2019-02-19 DIAGNOSIS — G894 Chronic pain syndrome: Secondary | ICD-10-CM | POA: Diagnosis not present

## 2019-02-19 DIAGNOSIS — M48062 Spinal stenosis, lumbar region with neurogenic claudication: Secondary | ICD-10-CM | POA: Diagnosis not present

## 2019-02-19 DIAGNOSIS — M47897 Other spondylosis, lumbosacral region: Secondary | ICD-10-CM | POA: Diagnosis not present

## 2019-02-19 DIAGNOSIS — M5412 Radiculopathy, cervical region: Secondary | ICD-10-CM | POA: Diagnosis not present

## 2019-02-19 DIAGNOSIS — M79609 Pain in unspecified limb: Secondary | ICD-10-CM | POA: Diagnosis not present

## 2019-02-19 DIAGNOSIS — R51 Headache: Secondary | ICD-10-CM | POA: Diagnosis not present

## 2019-02-19 DIAGNOSIS — M5030 Other cervical disc degeneration, unspecified cervical region: Secondary | ICD-10-CM | POA: Diagnosis not present

## 2019-03-19 DIAGNOSIS — G894 Chronic pain syndrome: Secondary | ICD-10-CM | POA: Diagnosis not present

## 2019-03-19 DIAGNOSIS — M5412 Radiculopathy, cervical region: Secondary | ICD-10-CM | POA: Diagnosis not present

## 2019-03-19 DIAGNOSIS — M5030 Other cervical disc degeneration, unspecified cervical region: Secondary | ICD-10-CM | POA: Diagnosis not present

## 2019-03-19 DIAGNOSIS — M5136 Other intervertebral disc degeneration, lumbar region: Secondary | ICD-10-CM | POA: Diagnosis not present

## 2019-04-16 DIAGNOSIS — M5030 Other cervical disc degeneration, unspecified cervical region: Secondary | ICD-10-CM | POA: Diagnosis not present

## 2019-04-16 DIAGNOSIS — M5412 Radiculopathy, cervical region: Secondary | ICD-10-CM | POA: Diagnosis not present

## 2019-04-16 DIAGNOSIS — G894 Chronic pain syndrome: Secondary | ICD-10-CM | POA: Diagnosis not present

## 2019-04-16 DIAGNOSIS — M5136 Other intervertebral disc degeneration, lumbar region: Secondary | ICD-10-CM | POA: Diagnosis not present

## 2019-05-14 DIAGNOSIS — M5412 Radiculopathy, cervical region: Secondary | ICD-10-CM | POA: Diagnosis not present

## 2019-05-14 DIAGNOSIS — M5030 Other cervical disc degeneration, unspecified cervical region: Secondary | ICD-10-CM | POA: Diagnosis not present

## 2019-05-14 DIAGNOSIS — M5136 Other intervertebral disc degeneration, lumbar region: Secondary | ICD-10-CM | POA: Diagnosis not present

## 2019-05-14 DIAGNOSIS — G894 Chronic pain syndrome: Secondary | ICD-10-CM | POA: Diagnosis not present

## 2019-06-11 DIAGNOSIS — M9931 Osseous stenosis of neural canal of cervical region: Secondary | ICD-10-CM | POA: Diagnosis not present

## 2019-06-11 DIAGNOSIS — M9951 Intervertebral disc stenosis of neural canal of cervical region: Secondary | ICD-10-CM | POA: Diagnosis not present

## 2019-06-11 DIAGNOSIS — M792 Neuralgia and neuritis, unspecified: Secondary | ICD-10-CM | POA: Diagnosis not present

## 2019-06-11 DIAGNOSIS — G8929 Other chronic pain: Secondary | ICD-10-CM | POA: Diagnosis not present

## 2019-06-11 DIAGNOSIS — M5030 Other cervical disc degeneration, unspecified cervical region: Secondary | ICD-10-CM | POA: Diagnosis not present

## 2019-06-11 DIAGNOSIS — Z5181 Encounter for therapeutic drug level monitoring: Secondary | ICD-10-CM | POA: Diagnosis not present

## 2019-06-11 DIAGNOSIS — G894 Chronic pain syndrome: Secondary | ICD-10-CM | POA: Diagnosis not present

## 2019-06-11 DIAGNOSIS — M48062 Spinal stenosis, lumbar region with neurogenic claudication: Secondary | ICD-10-CM | POA: Diagnosis not present

## 2019-06-11 DIAGNOSIS — M4807 Spinal stenosis, lumbosacral region: Secondary | ICD-10-CM | POA: Diagnosis not present

## 2019-06-11 DIAGNOSIS — M47897 Other spondylosis, lumbosacral region: Secondary | ICD-10-CM | POA: Diagnosis not present

## 2019-06-11 DIAGNOSIS — M5412 Radiculopathy, cervical region: Secondary | ICD-10-CM | POA: Diagnosis not present

## 2019-06-11 DIAGNOSIS — M4726 Other spondylosis with radiculopathy, lumbar region: Secondary | ICD-10-CM | POA: Diagnosis not present

## 2019-06-11 DIAGNOSIS — M5136 Other intervertebral disc degeneration, lumbar region: Secondary | ICD-10-CM | POA: Diagnosis not present

## 2019-07-09 DIAGNOSIS — G894 Chronic pain syndrome: Secondary | ICD-10-CM | POA: Diagnosis not present

## 2019-07-09 DIAGNOSIS — M5136 Other intervertebral disc degeneration, lumbar region: Secondary | ICD-10-CM | POA: Diagnosis not present

## 2019-07-09 DIAGNOSIS — M5412 Radiculopathy, cervical region: Secondary | ICD-10-CM | POA: Diagnosis not present

## 2019-07-09 DIAGNOSIS — M5030 Other cervical disc degeneration, unspecified cervical region: Secondary | ICD-10-CM | POA: Diagnosis not present

## 2019-07-15 ENCOUNTER — Encounter: Payer: Self-pay | Admitting: Nurse Practitioner

## 2019-07-15 ENCOUNTER — Other Ambulatory Visit: Payer: Self-pay

## 2019-07-15 ENCOUNTER — Ambulatory Visit: Payer: Medicaid Other | Admitting: Nurse Practitioner

## 2019-07-15 DIAGNOSIS — K047 Periapical abscess without sinus: Secondary | ICD-10-CM

## 2019-07-15 DIAGNOSIS — G894 Chronic pain syndrome: Secondary | ICD-10-CM | POA: Diagnosis not present

## 2019-07-15 DIAGNOSIS — I10 Essential (primary) hypertension: Secondary | ICD-10-CM | POA: Diagnosis not present

## 2019-07-15 MED ORDER — HYDROCHLOROTHIAZIDE 12.5 MG PO TABS: 12.5000 mg | ORAL_TABLET | Freq: Every day | ORAL | 3 refills | Status: DC

## 2019-07-15 MED ORDER — AMOXICILLIN-POT CLAVULANATE 875-125 MG PO TABS: 1.0000 | ORAL_TABLET | Freq: Two times a day (BID) | ORAL | 0 refills | Status: DC

## 2019-07-15 NOTE — Patient Instructions (Signed)
Colonoscopy, Adult, Care After °This sheet gives you information about how to care for yourself after your procedure. Your doctor may also give you more specific instructions. If you have problems or questions, call your doctor. °What can I expect after the procedure? °After the procedure, it is common to have: °· A small amount of blood in your poop for 24 hours. °· Some gas. °· Mild cramping or bloating in your belly. °Follow these instructions at home: °General instructions °· For the first 24 hours after the procedure: °? Do not drive or use machinery. °? Do not sign important documents. °? Do not drink alcohol. °? Do your daily activities more slowly than normal. °? Eat foods that are soft and easy to digest. °· Take over-the-counter or prescription medicines only as told by your doctor. °To help cramping and bloating: ° °· Try walking around. °· Put heat on your belly (abdomen) as told by your doctor. Use a heat source that your doctor recommends, such as a moist heat pack or a heating pad. °? Put a towel between your skin and the heat source. °? Leave the heat on for 20-30 minutes. °? Remove the heat if your skin turns bright red. This is especially important if you cannot feel pain, heat, or cold. You can get burned. °Eating and drinking ° °· Drink enough fluid to keep your pee (urine) clear or pale yellow. °· Return to your normal diet as told by your doctor. Avoid heavy or fried foods that are hard to digest. °· Avoid drinking alcohol for as long as told by your doctor. °Contact a doctor if: °· You have blood in your poop (stool) 2-3 days after the procedure. °Get help right away if: °· You have more than a small amount of blood in your poop. °· You see large clumps of tissue (blood clots) in your poop. °· Your belly is swollen. °· You feel sick to your stomach (nauseous). °· You throw up (vomit). °· You have a fever. °· You have belly pain that gets worse, and medicine does not help your  pain. °Summary °· After the procedure, it is common to have a small amount of blood in your poop. You may also have mild cramping and bloating in your belly. °· For the first 24 hours after the procedure, do not drive or use machinery, do not sign important documents, and do not drink alcohol. °· Get help right away if you have a lot of blood in your poop, feel sick to your stomach, have a fever, or have more belly pain. °This information is not intended to replace advice given to you by your health care provider. Make sure you discuss any questions you have with your health care provider. °Document Released: 10/29/2010 Document Revised: 07/27/2017 Document Reviewed: 06/20/2016 °Elsevier Patient Education © 2020 Elsevier Inc. ° °

## 2019-07-15 NOTE — Progress Notes (Signed)
New Patient Office Visit  Subjective:  Patient ID: Margaret Shepard, female    DOB: [birth year]  Age: 58 y.o. MRN: [id number]  CC:  Chief Complaint  Patient presents with  . Establish Care    HPI Margaret Shepard presents for new patient visit to establish care.  Introduced to Publishing rights manager role and practice setting.  All questions answered.  Previous received care across street and is now transferring.    HYPERTENSION Continues on HCTZ daily and has run out.  Reports always higher at office on BP readings, due to anxiety with provider offices. Hypertension status: stable  Satisfied with current treatment? yes Duration of hypertension: chronic BP monitoring frequency:  not checking BP range:  BP medication side effects:  no Medication compliance: good compliance Previous BP meds: HCTZ Aspirin: no Recurrent headaches: no Visual changes: no Palpitations: no Dyspnea: no Chest pain: no Lower extremity edema: no Dizzy/lightheaded: no   CHRONIC PAIN: Sees Dr. Metta Clines for pain management, in [place].  For ongoing back and hip pain, continues on Oxycodone, Gabapentin, and Flexeril.  Currently takes Oxycodone every day, usually 2-3 times.   Pain control status: stable Duration: chronic Location:  Quality: dull and aching Benefit from narcotic medications: yes What Activities task can be accomplished with current medication? Interested in weaning off narcotics:no   Stool softners/OTC fiber: yes  Previous pain specialty evaluation: yes Non-narcotic analgesic meds: yes Narcotic contract: with pain management   TOOTH ABSCESS: Broke a right back tooth 3 years ago, recently started hurting over weekend and was swollen.  Has been doing mouth rinses.  Denies fever.  Endorses pain to tooth, but states her pain medication is helping with this.  No drainage or bleeding reports.  Is scheduling to see dentist for further care.  Past Medical History:  Diagnosis Date  . Anemia    noted during hip surgery   . Anxiety    states no longer issue  . Frequent headaches   . Hypertension   . Lumbago   . 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 BRAIN SURGERY    . JOINT REPLACEMENT Bilateral   . JOINT REPLACEMENT Left    revision  . MANDIBLE SURGERY      Family History  Problem Relation Age of Onset  . Arthritis Mother   . COPD Mother   . Hyperlipidemia Mother   . Kidney disease Mother   . Alcohol abuse Father   . COPD Sister   . Stroke Sister   . Hypertension Sister   . Cancer Maternal Grandmother     Social History   Socioeconomic History  . Marital status: Divorced    Spouse name: Not on file  . Number of children: 0  . Years of education: Not on file  . Highest education level: Not on file  Occupational History  . Not on file  Social Needs  . Financial resource strain: Not hard at all  . Food insecurity    Worry: Never true    Inability: Never true  . Transportation needs    Medical: No    Non-medical: No  Tobacco Use  . Smoking status: Former Games developer  . Smokeless tobacco: Never Used  . Tobacco comment: 8-9 years ago  Substance and Sexual Activity  . Alcohol use: Yes    Alcohol/week: 0.0 standard drinks    Comment: socially  . Drug use: No  .  Sexual activity: Not Currently  Lifestyle  . Physical activity    Days per week: 0 days    Minutes per session: 0 min  . Stress: Only a little  Relationships  . Social Musicianconnections    Talks on phone: Three times a week    Gets together: Three times a week    Attends religious service: More than 4 times per year    Active member of club or organization: No    Attends meetings of clubs or organizations: Never    Relationship status: Divorced  . Intimate partner violence    Fear of current or ex partner: No    Emotionally abused: No    Physically abused: No    Forced sexual activity: No  Other Topics Concern  .  Not on file  Social History Narrative  . Not on file    ROS Review of Systems  Constitutional: Negative for activity change, appetite change, diaphoresis, fatigue and fever.  Respiratory: Negative for cough, chest tightness, shortness of breath and wheezing.   Cardiovascular: Negative for chest pain, palpitations and leg swelling.  Gastrointestinal: Negative for abdominal distention, abdominal pain, constipation, diarrhea, nausea and vomiting.  Endocrine: Negative for cold intolerance, heat intolerance, polydipsia, polyphagia and polyuria.  Musculoskeletal: Positive for arthralgias.  Neurological: Negative for dizziness, syncope, weakness, light-headedness, numbness and headaches.  Psychiatric/Behavioral: Negative.     Objective:   Today's Vitals: BP (!) 138/94 (BP Location: Left Arm, Patient Position: Sitting)   Pulse 97   Temp 98.4 F (36.9 C) (Oral)   Ht 5' 3.4" (1.61 m)   Wt 149 lb 12.8 oz (67.9 kg)   SpO2 95%   BMI 26.20 kg/m   Physical Exam Vitals signs and nursing note reviewed.  Constitutional:      General: She is awake. She is not in acute distress.    Appearance: She is well-developed. She is obese. She is not ill-appearing.  HENT:     Head: Normocephalic.     Right Ear: Hearing normal.     Left Ear: Hearing normal.     Mouth/Throat:     Mouth: Mucous membranes are moist.     Dentition: Dental abscesses present.      Comments: Poor dentition Eyes:     General: Lids are normal.        Right eye: No discharge.        Left eye: No discharge.     Conjunctiva/sclera: Conjunctivae normal.     Pupils: Pupils are equal, round, and reactive to light.  Neck:     Musculoskeletal: Normal range of motion and neck supple.     Vascular: No carotid bruit.  Cardiovascular:     Rate and Rhythm: Normal rate and regular rhythm.     Heart sounds: Normal heart sounds. No murmur. No gallop.   Pulmonary:     Effort: Pulmonary effort is normal. No accessory muscle usage or  respiratory distress.     Breath sounds: Normal breath sounds.  Abdominal:     General: Bowel sounds are normal.     Palpations: Abdomen is soft.  Musculoskeletal:     Right lower leg: No edema.     Left lower leg: No edema.  Skin:    General: Skin is warm and dry.  Neurological:     Mental Status: She is alert and oriented to person, place, and time.  Psychiatric:        Attention and Perception: Attention normal.  Mood and Affect: Mood normal.        Behavior: Behavior normal. Behavior is cooperative.        Thought Content: Thought content normal.        Judgment: Judgment normal.     Assessment & Plan:   Problem List Items Addressed This Visit      Cardiovascular and Mediastinum   Essential hypertension    Chronic, ongoing.  Script sent for HCTZ to restart this.  Have recommended checking BP at home at least 3 mornings a week and documenting.  DASH diet recommended.  Will plan for follow-up physical in 4 weeks with baseline labs to be obtained.       Relevant Medications   hydrochlorothiazide (HYDRODIURIL) 12.5 MG tablet     Digestive   Tooth abscess    To posterior, lower right tooth.  Script for Augmentin sent.  Have recommended Orajel as needed for discomfort.  Follow-up with dentist as soon as possible.        Other   Chronic pain syndrome    Chronic lumbar/cervical spine and hip pain.  Continue collaboration with Dr. Primus Bravo (pain management).  Patient is aware all pain medication refills and needs will need to come from pain clinic and will not be provided from PCP office.  Agrees with this plan.         Outpatient Encounter Medications as of 07/15/2019  Medication Sig  . cyclobenzaprine (FLEXERIL) 5 MG tablet Limit 1 tablet bid - tid if tolerated  . gabapentin (NEURONTIN) 300 MG capsule Limit 1 capsule bid-tid if tolerated  . hydrochlorothiazide (HYDRODIURIL) 12.5 MG tablet Take 1 tablet (12.5 mg total) by mouth daily.  Marland Kitchen oxyCODONE 30 MG 12 hr tablet  Limit 1 tab by mouth every 8-12 hours if tolerated  . oxyCODONE 30 MG 12 hr tablet Limit 1 tab by mouth every 8-12 hours if tolerated  . Oxycodone HCl 10 MG TABS Limit 4-6 tabs by mouth per day for breakthrough pain if tolerated while taking OxyContin  . [DISCONTINUED] hydrochlorothiazide (HYDRODIURIL) 12.5 MG tablet Take 12.5 mg by mouth daily.  Marland Kitchen amoxicillin-clavulanate (AUGMENTIN) 875-125 MG tablet Take 1 tablet by mouth 2 (two) times daily for 7 days.  . [DISCONTINUED] diazepam (VALIUM) 5 MG tablet Limit 1/2  tablet by mouth per day or  twice per day per day if tolerated (Patient not taking: Reported on 07/15/2019)   No facility-administered encounter medications on file as of 07/15/2019.     Follow-up: Return in about 4 weeks (around 08/12/2019) for Physical.   Venita Lick, NP

## 2019-07-15 NOTE — Assessment & Plan Note (Signed)
Chronic, ongoing.  Script sent for HCTZ to restart this.  Have recommended checking BP at home at least 3 mornings a week and documenting.  DASH diet recommended.  Will plan for follow-up physical in 4 weeks with baseline labs to be obtained.

## 2019-07-15 NOTE — Assessment & Plan Note (Signed)
Chronic lumbar/cervical spine and hip pain.  Continue collaboration with Dr. Primus Bravo (pain management).  Patient is aware all pain medication refills and needs will need to come from pain clinic and will not be provided from PCP office.  Agrees with this plan.

## 2019-07-15 NOTE — Assessment & Plan Note (Signed)
To posterior, lower right tooth.  Script for Augmentin sent.  Have recommended Orajel as needed for discomfort.  Follow-up with dentist as soon as possible.

## 2019-08-02 ENCOUNTER — Telehealth: Payer: Self-pay

## 2019-08-02 NOTE — Telephone Encounter (Signed)
Copied from [place] [id number]. Topic: General - Other >> Aug 02, 2019  2:51 PM Leward Quan A wrote: Reason for CRM: Patient called to say that she fell in her bathtub on 07/25/2019 but have not been checked out she states that nothing is broken but she still hurt a bit and wanted to be X rayed just to make sure that all is well in that area. Patient would like a call back with some information on how she can be scheduled or where she should go to get this X ray done. Can be reached at Ph#  [phone]   Called and spoke to patient. She states she fell on her bottom and requesting x-rays to make sure nothing is broken.

## 2019-08-02 NOTE — Telephone Encounter (Signed)
Called and left patient a VM letting her know what Jolene said.  

## 2019-08-02 NOTE — Telephone Encounter (Signed)
She would benefit from visit at urgent care for follow-up and they can obtain imaging while she is there.  If unable to get to urgent care today or over weekend, then will need follow-up in office next week and then can obtain imaging, however if in pain recommend she go to urgent care.

## 2019-08-06 DIAGNOSIS — M5136 Other intervertebral disc degeneration, lumbar region: Secondary | ICD-10-CM | POA: Diagnosis not present

## 2019-08-06 DIAGNOSIS — G894 Chronic pain syndrome: Secondary | ICD-10-CM | POA: Diagnosis not present

## 2019-08-06 DIAGNOSIS — M5412 Radiculopathy, cervical region: Secondary | ICD-10-CM | POA: Diagnosis not present

## 2019-08-06 DIAGNOSIS — M5030 Other cervical disc degeneration, unspecified cervical region: Secondary | ICD-10-CM | POA: Diagnosis not present

## 2019-08-08 ENCOUNTER — Telehealth: Payer: Self-pay | Admitting: Nurse Practitioner

## 2019-08-08 NOTE — Telephone Encounter (Signed)
Medication Refill - Medication: Amoxicillin  - Pt is in oral pain, please advise   Has the patient contacted their pharmacy? Yes.   (Agent: If no, request that the patient contact the pharmacy for the refill.) (Agent: If yes, when and what did the pharmacy advise?)  Preferred Pharmacy (with phone number or street name):  [facility] [place], [address] [place] [address]  Phone: [phone] Fax: [phone]     Agent: Please be advised that RX refills may take up to 3 business days. We ask that you follow-up with your pharmacy.

## 2019-08-09 ENCOUNTER — Other Ambulatory Visit: Payer: Self-pay | Admitting: Nurse Practitioner

## 2019-08-09 MED ORDER — AMOXICILLIN-POT CLAVULANATE 875-125 MG PO TABS: 1.0000 | ORAL_TABLET | Freq: Two times a day (BID) | ORAL | 0 refills | Status: AC

## 2019-08-09 NOTE — Telephone Encounter (Signed)
Routing to provider  

## 2019-08-09 NOTE — Telephone Encounter (Signed)
Patient notified

## 2019-08-09 NOTE — Telephone Encounter (Signed)
Have sent script x one, but if ongoing pain I recommend she be seen by dentist ASAP.  [facility] has a dental program that is lower cost and can often get people in fairly quickly.

## 2019-09-03 DIAGNOSIS — M5412 Radiculopathy, cervical region: Secondary | ICD-10-CM | POA: Diagnosis not present

## 2019-09-03 DIAGNOSIS — G894 Chronic pain syndrome: Secondary | ICD-10-CM | POA: Diagnosis not present

## 2019-09-03 DIAGNOSIS — M5136 Other intervertebral disc degeneration, lumbar region: Secondary | ICD-10-CM | POA: Diagnosis not present

## 2019-09-03 DIAGNOSIS — M5030 Other cervical disc degeneration, unspecified cervical region: Secondary | ICD-10-CM | POA: Diagnosis not present

## 2019-10-01 DIAGNOSIS — M5030 Other cervical disc degeneration, unspecified cervical region: Secondary | ICD-10-CM | POA: Diagnosis not present

## 2019-10-01 DIAGNOSIS — M5136 Other intervertebral disc degeneration, lumbar region: Secondary | ICD-10-CM | POA: Diagnosis not present

## 2019-10-01 DIAGNOSIS — G894 Chronic pain syndrome: Secondary | ICD-10-CM | POA: Diagnosis not present

## 2019-10-01 DIAGNOSIS — M5412 Radiculopathy, cervical region: Secondary | ICD-10-CM | POA: Diagnosis not present

## 2019-10-29 DIAGNOSIS — M545 Low back pain: Secondary | ICD-10-CM | POA: Diagnosis not present

## 2019-10-29 DIAGNOSIS — M5412 Radiculopathy, cervical region: Secondary | ICD-10-CM | POA: Diagnosis not present

## 2019-10-29 DIAGNOSIS — M5136 Other intervertebral disc degeneration, lumbar region: Secondary | ICD-10-CM | POA: Diagnosis not present

## 2019-10-29 DIAGNOSIS — Z79891 Long term (current) use of opiate analgesic: Secondary | ICD-10-CM | POA: Diagnosis not present

## 2019-10-29 DIAGNOSIS — M5030 Other cervical disc degeneration, unspecified cervical region: Secondary | ICD-10-CM | POA: Diagnosis not present

## 2019-10-29 DIAGNOSIS — G894 Chronic pain syndrome: Secondary | ICD-10-CM | POA: Diagnosis not present

## 2019-11-26 DIAGNOSIS — M5030 Other cervical disc degeneration, unspecified cervical region: Secondary | ICD-10-CM | POA: Diagnosis not present

## 2019-11-26 DIAGNOSIS — G894 Chronic pain syndrome: Secondary | ICD-10-CM | POA: Diagnosis not present

## 2019-12-24 DIAGNOSIS — G894 Chronic pain syndrome: Secondary | ICD-10-CM | POA: Diagnosis not present

## 2019-12-24 DIAGNOSIS — M5030 Other cervical disc degeneration, unspecified cervical region: Secondary | ICD-10-CM | POA: Diagnosis not present

## 2020-01-21 DIAGNOSIS — M5030 Other cervical disc degeneration, unspecified cervical region: Secondary | ICD-10-CM | POA: Diagnosis not present

## 2020-01-21 DIAGNOSIS — M5136 Other intervertebral disc degeneration, lumbar region: Secondary | ICD-10-CM | POA: Diagnosis not present

## 2020-01-21 DIAGNOSIS — G894 Chronic pain syndrome: Secondary | ICD-10-CM | POA: Diagnosis not present

## 2020-01-21 DIAGNOSIS — M542 Cervicalgia: Secondary | ICD-10-CM | POA: Diagnosis not present

## 2020-02-18 DIAGNOSIS — R519 Headache, unspecified: Secondary | ICD-10-CM | POA: Diagnosis not present

## 2020-02-18 DIAGNOSIS — Z79891 Long term (current) use of opiate analgesic: Secondary | ICD-10-CM | POA: Diagnosis not present

## 2020-02-18 DIAGNOSIS — I119 Hypertensive heart disease without heart failure: Secondary | ICD-10-CM | POA: Diagnosis not present

## 2020-02-18 DIAGNOSIS — M545 Low back pain: Secondary | ICD-10-CM | POA: Diagnosis not present

## 2020-02-18 DIAGNOSIS — G894 Chronic pain syndrome: Secondary | ICD-10-CM | POA: Diagnosis not present

## 2020-02-18 DIAGNOSIS — G8929 Other chronic pain: Secondary | ICD-10-CM | POA: Diagnosis not present

## 2020-02-18 DIAGNOSIS — M79609 Pain in unspecified limb: Secondary | ICD-10-CM | POA: Diagnosis not present

## 2020-02-18 DIAGNOSIS — M4726 Other spondylosis with radiculopathy, lumbar region: Secondary | ICD-10-CM | POA: Diagnosis not present

## 2020-02-18 DIAGNOSIS — M9931 Osseous stenosis of neural canal of cervical region: Secondary | ICD-10-CM | POA: Diagnosis not present

## 2020-02-18 DIAGNOSIS — M5030 Other cervical disc degeneration, unspecified cervical region: Secondary | ICD-10-CM | POA: Diagnosis not present

## 2020-02-18 DIAGNOSIS — M48062 Spinal stenosis, lumbar region with neurogenic claudication: Secondary | ICD-10-CM | POA: Diagnosis not present

## 2020-02-18 DIAGNOSIS — M9951 Intervertebral disc stenosis of neural canal of cervical region: Secondary | ICD-10-CM | POA: Diagnosis not present

## 2020-03-17 DIAGNOSIS — M5030 Other cervical disc degeneration, unspecified cervical region: Secondary | ICD-10-CM | POA: Diagnosis not present

## 2020-03-17 DIAGNOSIS — G894 Chronic pain syndrome: Secondary | ICD-10-CM | POA: Diagnosis not present

## 2020-03-17 DIAGNOSIS — M545 Low back pain: Secondary | ICD-10-CM | POA: Diagnosis not present

## 2020-03-17 DIAGNOSIS — Z79891 Long term (current) use of opiate analgesic: Secondary | ICD-10-CM | POA: Diagnosis not present

## 2020-04-14 DIAGNOSIS — M5136 Other intervertebral disc degeneration, lumbar region: Secondary | ICD-10-CM | POA: Diagnosis not present

## 2020-04-14 DIAGNOSIS — M542 Cervicalgia: Secondary | ICD-10-CM | POA: Diagnosis not present

## 2020-04-14 DIAGNOSIS — M5030 Other cervical disc degeneration, unspecified cervical region: Secondary | ICD-10-CM | POA: Diagnosis not present

## 2020-04-14 DIAGNOSIS — G894 Chronic pain syndrome: Secondary | ICD-10-CM | POA: Diagnosis not present

## 2020-05-12 DIAGNOSIS — M5030 Other cervical disc degeneration, unspecified cervical region: Secondary | ICD-10-CM | POA: Diagnosis not present

## 2020-05-12 DIAGNOSIS — G894 Chronic pain syndrome: Secondary | ICD-10-CM | POA: Diagnosis not present

## 2020-05-12 DIAGNOSIS — Z79891 Long term (current) use of opiate analgesic: Secondary | ICD-10-CM | POA: Diagnosis not present

## 2020-05-12 DIAGNOSIS — M545 Low back pain: Secondary | ICD-10-CM | POA: Diagnosis not present

## 2020-07-12 ENCOUNTER — Telehealth: Payer: Self-pay | Admitting: Nurse Practitioner

## 2020-07-12 NOTE — Telephone Encounter (Signed)
Pt overdue for appt- Last RF 07/15/19 RF due: yes Active med list: yes Future visit scheduled: no Called pt and LM on VM to call office and schedule appt.  Refusing RF

## 2020-07-13 NOTE — Telephone Encounter (Signed)
Noted  Pt needs about for further RF.  KP

## 2020-09-01 ENCOUNTER — Other Ambulatory Visit: Payer: Self-pay | Admitting: Nurse Practitioner

## 2020-09-01 NOTE — Telephone Encounter (Signed)
Attempted to call patient to schedule appt but no answer. Left message for patient to return call.  Last OV: 07/15/19 Last refill 07/15/19 #90 with 3 refills

## 2020-09-07 NOTE — Telephone Encounter (Signed)
Called and left a message letting patient know that she needs to schedule a follow up.

## 2020-09-08 NOTE — Telephone Encounter (Signed)
Patient scheduled appointment for 09/21/20. Can we send in enugh medicine to get to that appointment?

## 2020-09-15 ENCOUNTER — Telehealth: Payer: Self-pay

## 2020-09-15 NOTE — Telephone Encounter (Signed)
Copied from CRM [id number]. Topic: General - Inquiry >> Sep 15, 2020  3:16 PM Daphine Deutscher D wrote: Reason for CRM: pt called asking if Jolene wold go ahead and send her an antibiotic now for her UTI symptoms.  She hs a virtual appt Thursday with Gabriel Cirri.  [facility]  CB#  [phone]

## 2020-09-16 NOTE — Telephone Encounter (Signed)
Called and left a message letting patient know that she will need to have a visit before medication will be called in.

## 2020-09-16 NOTE — Telephone Encounter (Signed)
Patient needs to be seen first correct?

## 2020-09-16 NOTE — Telephone Encounter (Signed)
Needs visit first and may benefit from Covid testing at visit.

## 2020-09-17 ENCOUNTER — Ambulatory Visit (INDEPENDENT_AMBULATORY_CARE_PROVIDER_SITE_OTHER): Payer: Medicaid Other | Admitting: Unknown Physician Specialty

## 2020-09-17 ENCOUNTER — Encounter: Payer: Self-pay | Admitting: Unknown Physician Specialty

## 2020-09-17 ENCOUNTER — Other Ambulatory Visit: Payer: Self-pay

## 2020-09-17 DIAGNOSIS — J069 Acute upper respiratory infection, unspecified: Secondary | ICD-10-CM

## 2020-09-17 NOTE — Progress Notes (Signed)
There were no vitals taken for this visit.   Subjective:    Patient ID: Margaret Shepard, female    DOB: Feb 20, 1961, 59 y.o.   MRN: [id number]  HPI: Margaret Shepard is a 59 y.o. female  Chief Complaint  Patient presents with  . Cough    Nasal congestion, fatigue, nausea, diarrhea, and fever    This visit was completed via telephone due to the restrictions of the COVID-19 pandemic. All issues as above were discussed and addressed but no physical exam was performed. If it was felt that the patient should be evaluated in the office, they were directed there. The patient verbally consented to this visit. Patient was unable to complete an audio/visual visit due to Technical difficulties,Lack of internet. . Location of the patient: home . Location of the provider: work . Those involved with this call:  . Provider: Gabriel Cirri, DNP . CMA: Tiffany Reel, CMA . Front Desk/Registration: Harriet Pho  . Time spent on call: 10 minutes on the phone discussing health concerns. 10 minutes total spent in review of patient's record and preparation of their chart.  I verified patient identity using two factors (patient name and date of birth). Patient consents verbally to being seen via telemedicine visit today.   URI  This is a new problem. Episode onset: 4 days. The problem has been gradually worsening. Maximum temperature: does not have thermometer but feels like a fever. Associated symptoms include congestion, coughing, diarrhea, dysuria, headaches, rhinorrhea, sinus pain and sneezing. Pertinent negatives include no abdominal pain, chest pain, ear pain, joint pain, joint swelling, nausea, neck pain, plugged ear sensation, rash, sore throat, swollen glands, vomiting or wheezing. She has tried nothing for the symptoms. The treatment provided no relief.     Relevant past medical, surgical, family and social history reviewed and updated as indicated. Interim medical history since our last  visit reviewed. Allergies and medications reviewed and updated.  Review of Systems  HENT: Positive for congestion, rhinorrhea, sinus pain and sneezing. Negative for ear pain and sore throat.   Respiratory: Positive for cough. Negative for wheezing.   Cardiovascular: Negative for chest pain.  Gastrointestinal: Positive for diarrhea. Negative for abdominal pain, nausea and vomiting.  Genitourinary: Positive for dysuria.  Musculoskeletal: Negative for joint pain and neck pain.  Skin: Negative for rash.  Neurological: Positive for headaches.    Per HPI unless specifically indicated above     Objective:    There were no vitals taken for this visit.  Wt Readings from Last 3 Encounters:  07/15/19 149 lb 12.8 oz (67.9 kg)  06/09/16 138 lb (62.6 kg)  05/05/16 136 lb (61.7 kg)    Physical Exam Neurological:     Mental Status: She is alert.  Psychiatric:        Mood and Affect: Mood normal.        Behavior: Behavior normal.     Results for orders placed or performed in visit on 03/08/16  ToxASSURE Select 13 (MW), Urine  Result Value Ref Range   ToxAssure Select 13 FINAL    PDF .       Assessment & Plan:   Problem List Items Addressed This Visit   None   Visit Diagnoses    Viral upper respiratory tract infection    -  Primary   4 days of URI symptoms.  Encouraged to get covid testing and isolation precautions. Recommended Zinc, Quercetin, Melatonin, Vit C       Follow up  plan: Return if symptoms worsen or fail to improve.

## 2020-09-18 ENCOUNTER — Telehealth: Payer: Self-pay | Admitting: Unknown Physician Specialty

## 2020-09-18 ENCOUNTER — Other Ambulatory Visit: Payer: Self-pay | Admitting: Unknown Physician Specialty

## 2020-09-18 ENCOUNTER — Ambulatory Visit: Payer: Self-pay | Admitting: *Deleted

## 2020-09-18 DIAGNOSIS — U071 COVID-19: Secondary | ICD-10-CM

## 2020-09-18 DIAGNOSIS — E663 Overweight: Secondary | ICD-10-CM

## 2020-09-18 DIAGNOSIS — I10 Essential (primary) hypertension: Secondary | ICD-10-CM

## 2020-09-18 NOTE — Telephone Encounter (Signed)
Lvm with message from provider.  °

## 2020-09-18 NOTE — Telephone Encounter (Signed)
Patient tested positive for COVID patient experiencing muscle ache, fatigue, and head ache seeking clinical advice  C/o chills, headache, nausea, "blowing nose", body aches, cougting up brownish colored thick mucus. Symptoms started on Sunday 09/13/20. Reviewed isolation precautions. Denies chest pain , SOB, difficulty breathing.  Care advise given. Patient verbalized understanding of care advise and to call back or go to [facility] or ED if symptoms worsen.   Reason for Disposition . COVID-19 Disease, questions about  Answer Assessment - Initial Assessment Questions 1. COVID-19 DIAGNOSIS: "Who made your Coronavirus (COVID-19) diagnosis?" "Was it confirmed by a positive lab test?" If not diagnosed by a HCP, ask "Are there lots of cases (community spread) where you live?" (See public health department website, if unsure)     [facility] 2. COVID-19 EXPOSURE: "Was there any known exposure to COVID before the symptoms began?" CDC Definition of close contact: within 6 feet (2 meters) for a total of 15 minutes or more over a 24-hour period.      Na  3. ONSET: "When did the COVID-19 symptoms start?"      Sunday 09/13/20 4. WORST SYMPTOM: "What is your worst symptom?" (e.g., cough, fever, shortness of breath, muscle aches)     Headache, body aches, runny nose, nausea  5. COUGH: "Do you have a cough?" If Yes, ask: "How bad is the cough?"       Yes, not too bad coughing up brownish colored thick sputum 6. FEVER: "Do you have a fever?" If Yes, ask: "What is your temperature, how was it measured, and when did it start?"     Not sure no themometer available . Did have chills  7. RESPIRATORY STATUS: "Describe your breathing?" (e.g., shortness of breath, wheezing, unable to speak)      none 8. BETTER-SAME-WORSE: "Are you getting better, staying the same or getting worse compared to yesterday?"  If getting worse, ask, "In what way?"     A little better  9. HIGH RISK DISEASE: "Do you have any chronic medical  problems?" (e.g., asthma, heart or lung disease, weak immune system, obesity, etc.)     No  10. PREGNANCY: "Is there any chance you are pregnant?" "When was your last menstrual period?"       Na  11. OTHER SYMPTOMS: "Do you have any other symptoms?"  (e.g., chills, fatigue, headache, loss of smell or taste, muscle pain, sore throat; new loss of smell or taste especially support the diagnosis of COVID-19)       Chills fatigue, headache, loss of taste and smell, body aches, cough , nausea  Protocols used: CORONAVIRUS (COVID-19) DIAGNOSED OR SUSPECTED-A-AH

## 2020-09-18 NOTE — Telephone Encounter (Signed)
I connected by phone with Lorne Skeens on 09/18/2020 at 3:29 PM to discuss the potential use of a new treatment for mild to moderate COVID-19 viral infection in non-hospitalized patients.  This patient is a 59 y.o. female that meets the FDA criteria for Emergency Use Authorization of COVID monoclonal antibody casirivimab/imdevimab, bamlanivimab/eteseviamb, or sotrovimab.  Has a (+) direct SARS-CoV-2 viral test result  Has mild or moderate COVID-19   Is NOT hospitalized due to COVID-19  Is within 10 days of symptom onset  Has at least one of the high risk factor(s) for progression to severe COVID-19 and/or hospitalization as defined in EUA.  Specific high risk criteria : BMI > 25 and Cardiovascular disease or hypertension   I have spoken and communicated the following to the patient or parent/caregiver regarding COVID monoclonal antibody treatment:  1. FDA has authorized the emergency use for the treatment of mild to moderate COVID-19 in adults and pediatric patients with positive results of direct SARS-CoV-2 viral testing who are 54 years of age and older weighing at least 40 kg, and who are at high risk for progressing to severe COVID-19 and/or hospitalization.  2. The significant known and potential risks and benefits of COVID monoclonal antibody, and the extent to which such potential risks and benefits are unknown.  3. Information on available alternative treatments and the risks and benefits of those alternatives, including clinical trials.  4. Patients treated with COVID monoclonal antibody should continue to self-isolate and use infection control measures (e.g., wear mask, isolate, social distance, avoid sharing personal items, clean and disinfect "high touch" surfaces, and frequent handwashing) according to CDC guidelines.   5. The patient or parent/caregiver has the option to accept or refuse COVID monoclonal antibody treatment.  After reviewing this information with the  patient, the patient has agreed to receive one of the available covid 19 monoclonal antibodies and will be provided an appropriate fact sheet prior to infusion. Gabriel Cirri, NP 09/18/2020 3:29 PM  Sx onset 12/5

## 2020-09-18 NOTE — Telephone Encounter (Signed)
Please alert her that I have reached out to the monoclonal antibody infusion team and asked to place her on referral list for someone to reach out to her.  I do recommend she speak to them if they call and consider this outpatient infusion if qualifies.  I would like a virtual visit next week with provider in office for her.  If worsening symptoms over weekend I recommend she immediately go to ER or UC setting.

## 2020-09-19 ENCOUNTER — Ambulatory Visit (HOSPITAL_COMMUNITY): Payer: Medicaid Other

## 2020-09-21 ENCOUNTER — Ambulatory Visit: Payer: Medicaid Other | Admitting: Unknown Physician Specialty

## 2020-09-22 ENCOUNTER — Telehealth (INDEPENDENT_AMBULATORY_CARE_PROVIDER_SITE_OTHER): Payer: Medicaid Other | Admitting: Nurse Practitioner

## 2020-09-22 ENCOUNTER — Other Ambulatory Visit: Payer: Self-pay

## 2020-09-22 ENCOUNTER — Encounter: Payer: Self-pay | Admitting: Nurse Practitioner

## 2020-09-22 DIAGNOSIS — U071 COVID-19: Secondary | ICD-10-CM | POA: Diagnosis not present

## 2020-09-22 MED ORDER — ALBUTEROL SULFATE HFA 108 (90 BASE) MCG/ACT IN AERS: 2.0000 | INHALATION_SPRAY | RESPIRATORY_TRACT | 0 refills | Status: AC | PRN

## 2020-09-22 MED ORDER — CORICIDIN HBP CONGESTION/COUGH 10-200 MG PO CAPS: 1.0000 | ORAL_CAPSULE | ORAL | 0 refills | Status: AC | PRN

## 2020-09-22 NOTE — Progress Notes (Signed)
There were no vitals taken for this visit.   Subjective:    Patient ID: Margaret Shepard, female    DOB: February 06, 1961, 59 y.o.   MRN: [id number]  HPI: Margaret Shepard is a 59 y.o. female  Chief Complaint  Patient presents with   Sore Throat   Cough   Fatigue   Diarrhea   Shortness of Breath   Headache   Nasal Congestion   Covid Positive    Tested positive on Thursday 09/17/20     This visit was completed via telephone due to the restrictions of the COVID-19 pandemic. All issues as above were discussed and addressed but no physical exam was performed. If it was felt that the patient should be evaluated in the office, they were directed there. The patient verbally consented to this visit. Patient was unable to complete an audio/visual visit due to Technical difficulties,Lack of internet. Due to the catastrophic nature of the COVID-19 pandemic, this visit was done through audio contact only.  Location of the patient: home  Location of the provider: work  Those involved with this call:   Provider: Aura Dials, DNP  CMA: Wilhemena Durie, CMA  Front Desk/Registration: Harriet Pho   Time spent on call: 20 minutes on the phone discussing health concerns. 15 minutes total spent in review of patient's record and preparation of their chart.   I verified patient identity using two factors (patient name and date of birth). Patient consents verbally to being seen via telemedicine visit today.   COVID INFECTION Tested positive for Covid on 09/17/2020 -- symptoms started December 5th.  She was to have MAB infusion, but missed this appointment.  Was supposed to go on Saturday for this, states it was too far to drive.  Reports a church member gave her some medicine tea, which has been helping "more than anything".  Has been taking Vitamin D and Vitamin C.  Reports she is starting to feel better.  Fever: occasional and improving Cough: yes Shortness of breath:  occasional, improving Wheezing: yes Chest pain: no Chest tightness: no Chest congestion: no Nasal congestion: yes Runny nose: yes Post nasal drip: yes Sneezing: no Sore throat: yes Swollen glands: no Sinus pressure: no Headache: yes Face pain: no Toothache: no Ear pain: none Ear pressure: none Eyes red/itching:no Eye drainage/crusting: no  Vomiting: no Rash: no Fatigue: yes Sick contacts: yes Strep contacts: no  Context: better Recurrent sinusitis: no Relief with OTC cold/cough medications: yes  Treatments attempted: cold/sinus  Relevant past medical, surgical, family and social history reviewed and updated as indicated. Interim medical history since our last visit reviewed. Allergies and medications reviewed and updated.  Review of Systems  Constitutional: Positive for fatigue and fever. Negative for activity change and appetite change.  HENT: Positive for congestion, postnasal drip, rhinorrhea, sinus pressure and sore throat. Negative for ear discharge, ear pain, facial swelling, sinus pain, sneezing and voice change.   Eyes: Negative for pain and visual disturbance.  Respiratory: Positive for cough, chest tightness, shortness of breath and wheezing.   Cardiovascular: Negative for chest pain, palpitations and leg swelling.  Gastrointestinal: Positive for diarrhea. Negative for abdominal distention, abdominal pain, constipation, nausea and vomiting.  Endocrine: Negative.   Musculoskeletal: Positive for myalgias.  Neurological: Positive for headaches. Negative for dizziness and numbness.  Psychiatric/Behavioral: Negative.     Per HPI unless specifically indicated above     Objective:    There were no vitals taken for this visit.  Wt Readings from  Last 3 Encounters:  07/15/19 149 lb 12.8 oz (67.9 kg)  06/09/16 138 lb (62.6 kg)  05/05/16 136 lb (61.7 kg)    Physical Exam   Unable to assess due to telephone visit only, patient with no video access.  Results  for orders placed or performed in visit on 03/08/16  ToxASSURE Select 13 (MW), Urine  Result Value Ref Range   ToxAssure Select 13 FINAL    PDF .       Assessment & Plan:   Problem List Items Addressed This Visit      Other   Lab test positive for detection of COVID-19 virus    Tested positive 09/17/20, symptoms ongoing for 9 days -- reports is improving.  Missed MAB infusion in [place], recommended she reach out to [facility] today to see if MAB available, as this would be closer for her and highly recommend this.  Discussed that MAB is the main approved outpatient treatment for Covid.  At this time will send in Albuterol inhaler and Coricidin for symptom management.  Recommend she monitor O2 sats at home and immediately go to ER or notify provider if <90%. Continue OTC Tylenol for symptoms relief.  Discussed main symptoms to immediately be seen in ER setting for and she was able to verbalize these back.  Return to office in 2 weeks for follow-up, sooner if worsening symptoms.         I discussed the assessment and treatment plan with the patient. The patient was provided an opportunity to ask questions and all were answered. The patient agreed with the plan and demonstrated an understanding of the instructions.   The patient was advised to call back or seek an in-person evaluation if the symptoms worsen or if the condition fails to improve as anticipated.   I provided 21+ minutes of time during this encounter.  Follow up plan: Return in about 2 weeks (around 10/06/2020) for Covid follow-up and lung check in office.

## 2020-09-22 NOTE — Patient Instructions (Signed)
COVID-19 COVID-19 is a respiratory infection that is caused by a virus called severe acute respiratory syndrome coronavirus 2 (SARS-CoV-2). The disease is also known as coronavirus disease or novel coronavirus. In some people, the virus may not cause any symptoms. In others, it may cause a serious infection. The infection can get worse quickly and can lead to complications, such as:  Pneumonia, or infection of the lungs.  Acute respiratory distress syndrome or ARDS. This is a condition in which fluid build-up in the lungs prevents the lungs from filling with air and passing oxygen into the blood.  Acute respiratory failure. This is a condition in which there is not enough oxygen passing from the lungs to the body or when carbon dioxide is not passing from the lungs out of the body.  Sepsis or septic shock. This is a serious bodily reaction to an infection.  Blood clotting problems.  Secondary infections due to bacteria or fungus.  Organ failure. This is when your body's organs stop working. The virus that causes COVID-19 is contagious. This means that it can spread from person to person through droplets from coughs and sneezes (respiratory secretions). What are the causes? This illness is caused by a virus. You may catch the virus by:  Breathing in droplets from an infected person. Droplets can be spread by a person breathing, speaking, singing, coughing, or sneezing.  Touching something, like a table or a doorknob, that was exposed to the virus (contaminated) and then touching your mouth, nose, or eyes. What increases the risk? Risk for infection You are more likely to be infected with this virus if you:  Are within 6 feet (2 meters) of a person with COVID-19.  Provide care for or live with a person who is infected with COVID-19.  Spend time in crowded indoor spaces or live in shared housing. Risk for serious illness You are more likely to become seriously ill from the virus if you:   Are 50 years of age or older. The higher your age, the more you are at risk for serious illness.  Live in a nursing home or long-term care facility.  Have cancer.  Have a long-term (chronic) disease such as: ? Chronic lung disease, including chronic obstructive pulmonary disease or asthma. ? A long-term disease that lowers your body's ability to fight infection (immunocompromised). ? Heart disease, including heart failure, a condition in which the arteries that lead to the heart become narrow or blocked (coronary artery disease), a disease which makes the heart muscle thick, weak, or stiff (cardiomyopathy). ? Diabetes. ? Chronic kidney disease. ? Sickle cell disease, a condition in which red blood cells have an abnormal "sickle" shape. ? Liver disease.  Are obese. What are the signs or symptoms? Symptoms of this condition can range from mild to severe. Symptoms may appear any time from 2 to 14 days after being exposed to the virus. They include:  A fever or chills.  A cough.  Difficulty breathing.  Headaches, body aches, or muscle aches.  Runny or stuffy (congested) nose.  A sore throat.  New loss of taste or smell. Some people may also have stomach problems, such as nausea, vomiting, or diarrhea. Other people may not have any symptoms of COVID-19. How is this diagnosed? This condition may be diagnosed based on:  Your signs and symptoms, especially if: ? You live in an area with a COVID-19 outbreak. ? You recently traveled to or from an area where the virus is common. ? You   provide care for or live with a person who was diagnosed with COVID-19. ? You were exposed to a person who was diagnosed with COVID-19.  A physical exam.  Lab tests, which may include: ? Taking a sample of fluid from the back of your nose and throat (nasopharyngeal fluid), your nose, or your throat using a swab. ? A sample of mucus from your lungs (sputum). ? Blood tests.  Imaging tests, which  may include, X-rays, CT scan, or ultrasound. How is this treated? At present, there is no medicine to treat COVID-19. Medicines that treat other diseases are being used on a trial basis to see if they are effective against COVID-19. Your health care provider will talk with you about ways to treat your symptoms. For most people, the infection is mild and can be managed at home with rest, fluids, and over-the-counter medicines. Treatment for a serious infection usually takes places in a hospital intensive care unit (ICU). It may include one or more of the following treatments. These treatments are given until your symptoms improve.  Receiving fluids and medicines through an IV.  Supplemental oxygen. Extra oxygen is given through a tube in the nose, a face mask, or a hood.  Positioning you to lie on your stomach (prone position). This makes it easier for oxygen to get into the lungs.  Continuous positive airway pressure (CPAP) or bi-level positive airway pressure (BPAP) machine. This treatment uses mild air pressure to keep the airways open. A tube that is connected to a motor delivers oxygen to the body.  Ventilator. This treatment moves air into and out of the lungs by using a tube that is placed in your windpipe.  Tracheostomy. This is a procedure to create a hole in the neck so that a breathing tube can be inserted.  Extracorporeal membrane oxygenation (ECMO). This procedure gives the lungs a chance to recover by taking over the functions of the heart and lungs. It supplies oxygen to the body and removes carbon dioxide. Follow these instructions at home: Lifestyle  If you are sick, stay home except to get medical care. Your health care provider will tell you how long to stay home. Call your health care provider before you go for medical care.  Rest at home as told by your health care provider.  Do not use any products that contain nicotine or tobacco, such as cigarettes, e-cigarettes, and  chewing tobacco. If you need help quitting, ask your health care provider.  Return to your normal activities as told by your health care provider. Ask your health care provider what activities are safe for you. General instructions  Take over-the-counter and prescription medicines only as told by your health care provider.  Drink enough fluid to keep your urine pale yellow.  Keep all follow-up visits as told by your health care provider. This is important. How is this prevented?  There is no vaccine to help prevent COVID-19 infection. However, there are steps you can take to protect yourself and others from this virus. To protect yourself:   Do not travel to areas where COVID-19 is a risk. The areas where COVID-19 is reported change often. To identify high-risk areas and travel restrictions, check the CDC travel website: wwwnc.cdc.gov/travel/notices  If you live in, or must travel to, an area where COVID-19 is a risk, take precautions to avoid infection. ? Stay away from people who are sick. ? Wash your hands often with soap and water for 20 seconds. If soap and water   are not available, use an alcohol-based hand sanitizer. ? Avoid touching your mouth, face, eyes, or nose. ? Avoid going out in public, follow guidance from your state and local health authorities. ? If you must go out in public, wear a cloth face covering or face mask. Make sure your mask covers your nose and mouth. ? Avoid crowded indoor spaces. Stay at least 6 feet (2 meters) away from others. ? Disinfect objects and surfaces that are frequently touched every day. This may include:  Counters and tables.  Doorknobs and light switches.  Sinks and faucets.  Electronics, such as phones, remote controls, keyboards, computers, and tablets. To protect others: If you have symptoms of COVID-19, take steps to prevent the virus from spreading to others.  If you think you have a COVID-19 infection, contact your health care  provider right away. Tell your health care team that you think you may have a COVID-19 infection.  Stay home. Leave your house only to seek medical care. Do not use public transport.  Do not travel while you are sick.  Wash your hands often with soap and water for 20 seconds. If soap and water are not available, use alcohol-based hand sanitizer.  Stay away from other members of your household. Let healthy household members care for children and pets, if possible. If you have to care for children or pets, wash your hands often and wear a mask. If possible, stay in your own room, separate from others. Use a different bathroom.  Make sure that all people in your household wash their hands well and often.  Cough or sneeze into a tissue or your sleeve or elbow. Do not cough or sneeze into your hand or into the air.  Wear a cloth face covering or face mask. Make sure your mask covers your nose and mouth. Where to find more information  Centers for Disease Control and Prevention: www.cdc.gov/coronavirus/2019-ncov/index.html  World Health Organization: www.who.int/health-topics/coronavirus Contact a health care provider if:  You live in or have traveled to an area where COVID-19 is a risk and you have symptoms of the infection.  You have had contact with someone who has COVID-19 and you have symptoms of the infection. Get help right away if:  You have trouble breathing.  You have pain or pressure in your chest.  You have confusion.  You have bluish lips and fingernails.  You have difficulty waking from sleep.  You have symptoms that get worse. These symptoms may represent a serious problem that is an emergency. Do not wait to see if the symptoms will go away. Get medical help right away. Call your local emergency services (911 in the [place] Do not drive yourself to the hospital. Let the emergency medical personnel know if you think you have COVID-19. Summary  COVID-19 is a  respiratory infection that is caused by a virus. It is also known as coronavirus disease or novel coronavirus. It can cause serious infections, such as pneumonia, acute respiratory distress syndrome, acute respiratory failure, or sepsis.  The virus that causes COVID-19 is contagious. This means that it can spread from person to person through droplets from breathing, speaking, singing, coughing, or sneezing.  You are more likely to develop a serious illness if you are 50 years of age or older, have a weak immune system, live in a nursing home, or have chronic disease.  There is no medicine to treat COVID-19. Your health care provider will talk with you about ways to treat your symptoms.    Take steps to protect yourself and others from infection. Wash your hands often and disinfect objects and surfaces that are frequently touched every day. Stay away from people who are sick and wear a mask if you are sick. This information is not intended to replace advice given to you by your health care provider. Make sure you discuss any questions you have with your health care provider. Document Revised: 07/26/2019 Document Reviewed: 11/01/2018 Elsevier Patient Education  2020 [employer]  

## 2020-09-22 NOTE — Assessment & Plan Note (Addendum)
Tested positive 09/17/20, symptoms ongoing for 9 days -- reports is improving.  Missed MAB infusion in [place], recommended she reach out to [facility] today to see if MAB available, as this would be closer for her and highly recommend this.  Discussed that MAB is the main approved outpatient treatment for Covid.  At this time will send in Albuterol inhaler and Coricidin for symptom management.  Recommend she monitor O2 sats at home and immediately go to ER or notify provider if <90%. Continue OTC Tylenol for symptoms relief.  Discussed main symptoms to immediately be seen in ER setting for and she was able to verbalize these back.  Return to office in 2 weeks for follow-up, sooner if worsening symptoms.

## 2020-09-23 ENCOUNTER — Telehealth: Payer: Self-pay

## 2020-09-23 ENCOUNTER — Telehealth: Payer: Self-pay | Admitting: Nurse Practitioner

## 2020-09-23 ENCOUNTER — Encounter: Payer: Self-pay | Admitting: Nurse Practitioner

## 2020-09-23 NOTE — Telephone Encounter (Signed)
faxed

## 2020-09-23 NOTE — Telephone Encounter (Signed)
Called pt to advise of Jolene's message, no answer, left vm 

## 2020-09-23 NOTE — Telephone Encounter (Addendum)
Pt does not want to make an appt now . Pt said she is feeling better with mucinex and inhaler. Please advise

## 2020-09-23 NOTE — Telephone Encounter (Signed)
Noted, however I do recommend visit to ensure resolution and lung check.  If worsening or ongoing symptoms recommend she come for visit.  Thank you:)

## 2020-09-23 NOTE — Telephone Encounter (Signed)
lvm to make this apt.  

## 2020-09-23 NOTE — Telephone Encounter (Signed)
Letter written and provided for faxing:)

## 2020-09-23 NOTE — Telephone Encounter (Signed)
Para March, from Dr. Ewing Schlein, calling and is requesting to have a letter stating that the pt tested positive for covid. Pt has an appt with provider next week and the provider is needing to have a note so that he can do a virtual visit with pt. Please advise.    Callback # [phone]   Fax# [phone]

## 2020-09-23 NOTE — Telephone Encounter (Signed)
-----   Message from Marjie Skiff, NP sent at 09/22/2020 11:51 AM EST ----- Covid follow-up and lung check in office

## 2020-09-24 ENCOUNTER — Other Ambulatory Visit: Payer: Self-pay

## 2020-09-24 ENCOUNTER — Inpatient Hospital Stay
Admission: EM | Admit: 2020-09-24 | Discharge: 2020-11-10 | DRG: 004 | Disposition: E | Payer: Medicaid Other | Attending: Internal Medicine | Admitting: Internal Medicine

## 2020-09-24 ENCOUNTER — Emergency Department: Payer: Medicaid Other

## 2020-09-24 DIAGNOSIS — Z9911 Dependence on respirator [ventilator] status: Secondary | ICD-10-CM | POA: Diagnosis not present

## 2020-09-24 DIAGNOSIS — R609 Edema, unspecified: Secondary | ICD-10-CM

## 2020-09-24 DIAGNOSIS — I82403 Acute embolism and thrombosis of unspecified deep veins of lower extremity, bilateral: Secondary | ICD-10-CM | POA: Diagnosis not present

## 2020-09-24 DIAGNOSIS — E875 Hyperkalemia: Secondary | ICD-10-CM | POA: Diagnosis not present

## 2020-09-24 DIAGNOSIS — R6521 Severe sepsis with septic shock: Secondary | ICD-10-CM | POA: Diagnosis present

## 2020-09-24 DIAGNOSIS — R739 Hyperglycemia, unspecified: Secondary | ICD-10-CM | POA: Diagnosis present

## 2020-09-24 DIAGNOSIS — Z885 Allergy status to narcotic agent status: Secondary | ICD-10-CM

## 2020-09-24 DIAGNOSIS — Z452 Encounter for adjustment and management of vascular access device: Secondary | ICD-10-CM

## 2020-09-24 DIAGNOSIS — Z87891 Personal history of nicotine dependence: Secondary | ICD-10-CM

## 2020-09-24 DIAGNOSIS — Z7189 Other specified counseling: Secondary | ICD-10-CM | POA: Diagnosis not present

## 2020-09-24 DIAGNOSIS — R319 Hematuria, unspecified: Secondary | ICD-10-CM | POA: Diagnosis not present

## 2020-09-24 DIAGNOSIS — M899 Disorder of bone, unspecified: Secondary | ICD-10-CM | POA: Diagnosis present

## 2020-09-24 DIAGNOSIS — F419 Anxiety disorder, unspecified: Secondary | ICD-10-CM | POA: Diagnosis present

## 2020-09-24 DIAGNOSIS — Z825 Family history of asthma and other chronic lower respiratory diseases: Secondary | ICD-10-CM

## 2020-09-24 DIAGNOSIS — L89322 Pressure ulcer of left buttock, stage 2: Secondary | ICD-10-CM | POA: Diagnosis present

## 2020-09-24 DIAGNOSIS — N170 Acute kidney failure with tubular necrosis: Secondary | ICD-10-CM | POA: Diagnosis not present

## 2020-09-24 DIAGNOSIS — Z823 Family history of stroke: Secondary | ICD-10-CM

## 2020-09-24 DIAGNOSIS — I130 Hypertensive heart and chronic kidney disease with heart failure and stage 1 through stage 4 chronic kidney disease, or unspecified chronic kidney disease: Secondary | ICD-10-CM | POA: Diagnosis present

## 2020-09-24 DIAGNOSIS — Z4682 Encounter for fitting and adjustment of non-vascular catheter: Secondary | ICD-10-CM | POA: Diagnosis not present

## 2020-09-24 DIAGNOSIS — Y92230 Patient room in hospital as the place of occurrence of the external cause: Secondary | ICD-10-CM | POA: Diagnosis not present

## 2020-09-24 DIAGNOSIS — I10 Essential (primary) hypertension: Secondary | ICD-10-CM

## 2020-09-24 DIAGNOSIS — R52 Pain, unspecified: Secondary | ICD-10-CM | POA: Diagnosis not present

## 2020-09-24 DIAGNOSIS — T462X5A Adverse effect of other antidysrhythmic drugs, initial encounter: Secondary | ICD-10-CM | POA: Diagnosis not present

## 2020-09-24 DIAGNOSIS — E872 Acidosis, unspecified: Secondary | ICD-10-CM

## 2020-09-24 DIAGNOSIS — J9602 Acute respiratory failure with hypercapnia: Secondary | ICD-10-CM | POA: Diagnosis not present

## 2020-09-24 DIAGNOSIS — N2581 Secondary hyperparathyroidism of renal origin: Secondary | ICD-10-CM | POA: Diagnosis present

## 2020-09-24 DIAGNOSIS — J1282 Pneumonia due to coronavirus disease 2019: Secondary | ICD-10-CM | POA: Diagnosis not present

## 2020-09-24 DIAGNOSIS — J9690 Respiratory failure, unspecified, unspecified whether with hypoxia or hypercapnia: Secondary | ICD-10-CM | POA: Diagnosis not present

## 2020-09-24 DIAGNOSIS — G928 Other toxic encephalopathy: Secondary | ICD-10-CM | POA: Diagnosis present

## 2020-09-24 DIAGNOSIS — D6489 Other specified anemias: Secondary | ICD-10-CM | POA: Diagnosis present

## 2020-09-24 DIAGNOSIS — J80 Acute respiratory distress syndrome: Secondary | ICD-10-CM

## 2020-09-24 DIAGNOSIS — U071 COVID-19: Secondary | ICD-10-CM | POA: Diagnosis present

## 2020-09-24 DIAGNOSIS — R809 Proteinuria, unspecified: Secondary | ICD-10-CM | POA: Diagnosis not present

## 2020-09-24 DIAGNOSIS — Z136 Encounter for screening for cardiovascular disorders: Secondary | ICD-10-CM | POA: Diagnosis not present

## 2020-09-24 DIAGNOSIS — Z66 Do not resuscitate: Secondary | ICD-10-CM | POA: Diagnosis not present

## 2020-09-24 DIAGNOSIS — B029 Zoster without complications: Secondary | ICD-10-CM | POA: Diagnosis not present

## 2020-09-24 DIAGNOSIS — N189 Chronic kidney disease, unspecified: Secondary | ICD-10-CM | POA: Diagnosis present

## 2020-09-24 DIAGNOSIS — I5041 Acute combined systolic (congestive) and diastolic (congestive) heart failure: Secondary | ICD-10-CM | POA: Diagnosis present

## 2020-09-24 DIAGNOSIS — N179 Acute kidney failure, unspecified: Secondary | ICD-10-CM | POA: Diagnosis not present

## 2020-09-24 DIAGNOSIS — R0902 Hypoxemia: Secondary | ICD-10-CM | POA: Diagnosis not present

## 2020-09-24 DIAGNOSIS — D638 Anemia in other chronic diseases classified elsewhere: Secondary | ICD-10-CM | POA: Diagnosis present

## 2020-09-24 DIAGNOSIS — Z6824 Body mass index (BMI) 24.0-24.9, adult: Secondary | ICD-10-CM

## 2020-09-24 DIAGNOSIS — Z981 Arthrodesis status: Secondary | ICD-10-CM

## 2020-09-24 DIAGNOSIS — Z83438 Family history of other disorder of lipoprotein metabolism and other lipidemia: Secondary | ICD-10-CM

## 2020-09-24 DIAGNOSIS — Z841 Family history of disorders of kidney and ureter: Secondary | ICD-10-CM

## 2020-09-24 DIAGNOSIS — I472 Ventricular tachycardia: Secondary | ICD-10-CM | POA: Diagnosis not present

## 2020-09-24 DIAGNOSIS — R069 Unspecified abnormalities of breathing: Secondary | ICD-10-CM | POA: Diagnosis not present

## 2020-09-24 DIAGNOSIS — R064 Hyperventilation: Secondary | ICD-10-CM | POA: Diagnosis not present

## 2020-09-24 DIAGNOSIS — Z79891 Long term (current) use of opiate analgesic: Secondary | ICD-10-CM

## 2020-09-24 DIAGNOSIS — Z515 Encounter for palliative care: Secondary | ICD-10-CM | POA: Diagnosis not present

## 2020-09-24 DIAGNOSIS — I2699 Other pulmonary embolism without acute cor pulmonale: Secondary | ICD-10-CM | POA: Diagnosis present

## 2020-09-24 DIAGNOSIS — N186 End stage renal disease: Secondary | ICD-10-CM | POA: Diagnosis not present

## 2020-09-24 DIAGNOSIS — Z96642 Presence of left artificial hip joint: Secondary | ICD-10-CM | POA: Diagnosis present

## 2020-09-24 DIAGNOSIS — J189 Pneumonia, unspecified organism: Secondary | ICD-10-CM | POA: Diagnosis not present

## 2020-09-24 DIAGNOSIS — Z79899 Other long term (current) drug therapy: Secondary | ICD-10-CM

## 2020-09-24 DIAGNOSIS — R918 Other nonspecific abnormal finding of lung field: Secondary | ICD-10-CM | POA: Diagnosis not present

## 2020-09-24 DIAGNOSIS — Z978 Presence of other specified devices: Secondary | ICD-10-CM

## 2020-09-24 DIAGNOSIS — T508X5A Adverse effect of diagnostic agents, initial encounter: Secondary | ICD-10-CM | POA: Diagnosis not present

## 2020-09-24 DIAGNOSIS — I4892 Unspecified atrial flutter: Secondary | ICD-10-CM | POA: Diagnosis not present

## 2020-09-24 DIAGNOSIS — A4189 Other specified sepsis: Secondary | ICD-10-CM | POA: Diagnosis present

## 2020-09-24 DIAGNOSIS — E870 Hyperosmolality and hypernatremia: Secondary | ICD-10-CM | POA: Diagnosis not present

## 2020-09-24 DIAGNOSIS — Z8261 Family history of arthritis: Secondary | ICD-10-CM

## 2020-09-24 DIAGNOSIS — R197 Diarrhea, unspecified: Secondary | ICD-10-CM | POA: Diagnosis present

## 2020-09-24 DIAGNOSIS — I959 Hypotension, unspecified: Secondary | ICD-10-CM | POA: Diagnosis not present

## 2020-09-24 DIAGNOSIS — J9600 Acute respiratory failure, unspecified whether with hypoxia or hypercapnia: Secondary | ICD-10-CM | POA: Diagnosis not present

## 2020-09-24 DIAGNOSIS — J9601 Acute respiratory failure with hypoxia: Secondary | ICD-10-CM | POA: Diagnosis not present

## 2020-09-24 DIAGNOSIS — Z8249 Family history of ischemic heart disease and other diseases of the circulatory system: Secondary | ICD-10-CM

## 2020-09-24 DIAGNOSIS — I361 Nonrheumatic tricuspid (valve) insufficiency: Secondary | ICD-10-CM | POA: Diagnosis not present

## 2020-09-24 DIAGNOSIS — Z4659 Encounter for fitting and adjustment of other gastrointestinal appliance and device: Secondary | ICD-10-CM

## 2020-09-24 DIAGNOSIS — L8990 Pressure ulcer of unspecified site, unspecified stage: Secondary | ICD-10-CM | POA: Insufficient documentation

## 2020-09-24 DIAGNOSIS — Z811 Family history of alcohol abuse and dependence: Secondary | ICD-10-CM

## 2020-09-24 DIAGNOSIS — G894 Chronic pain syndrome: Secondary | ICD-10-CM | POA: Diagnosis present

## 2020-09-24 DIAGNOSIS — R0603 Acute respiratory distress: Secondary | ICD-10-CM | POA: Diagnosis not present

## 2020-09-24 LAB — CBC WITH DIFFERENTIAL/PLATELET
Abs Immature Granulocytes: 0.14 10*3/uL — ABNORMAL HIGH (ref 0.00–0.07)
Basophils Absolute: 0 10*3/uL (ref 0.0–0.1)
Basophils Relative: 0 %
Eosinophils Absolute: 0 10*3/uL (ref 0.0–0.5)
Eosinophils Relative: 0 %
HCT: 42.1 % (ref 36.0–46.0)
Hemoglobin: 13.9 g/dL (ref 12.0–15.0)
Immature Granulocytes: 2 %
Lymphocytes Relative: 7 %
Lymphs Abs: 0.6 10*3/uL — ABNORMAL LOW (ref 0.7–4.0)
MCH: 28.3 pg (ref 26.0–34.0)
MCHC: 33 g/dL (ref 30.0–36.0)
MCV: 85.6 fL (ref 80.0–100.0)
Monocytes Absolute: 0.4 10*3/uL (ref 0.1–1.0)
Monocytes Relative: 5 %
Neutro Abs: 8.4 10*3/uL — ABNORMAL HIGH (ref 1.7–7.7)
Neutrophils Relative %: 86 %
Platelets: 323 10*3/uL (ref 150–400)
RBC: 4.92 MIL/uL (ref 3.87–5.11)
RDW: 12.7 % (ref 11.5–15.5)
WBC: 9.6 10*3/uL (ref 4.0–10.5)
nRBC: 0 % (ref 0.0–0.2)

## 2020-09-24 LAB — TRIGLYCERIDES: Triglycerides: 104 mg/dL (ref <150)

## 2020-09-24 LAB — COMPREHENSIVE METABOLIC PANEL
ALT: 32 U/L (ref 0–44)
AST: 57 U/L — ABNORMAL HIGH (ref 15–41)
Albumin: 3.1 g/dL — ABNORMAL LOW (ref 3.5–5.0)
Alkaline Phosphatase: 86 U/L (ref 38–126)
Anion gap: 18 — ABNORMAL HIGH (ref 5–15)
BUN: 14 mg/dL (ref 6–20)
CO2: 19 mmol/L — ABNORMAL LOW (ref 22–32)
Calcium: 8.2 mg/dL — ABNORMAL LOW (ref 8.9–10.3)
Chloride: 99 mmol/L (ref 98–111)
Creatinine, Ser: 0.76 mg/dL (ref 0.44–1.00)
GFR, Estimated: >60 mL/min (ref >60)
Glucose, Bld: 174 mg/dL — ABNORMAL HIGH (ref 70–99)
Potassium: 3.8 mmol/L (ref 3.5–5.1)
Sodium: 136 mmol/L (ref 135–145)
Total Bilirubin: 1 mg/dL (ref 0.3–1.2)
Total Protein: 7.8 g/dL (ref 6.5–8.1)

## 2020-09-24 LAB — PROTIME-INR
INR: 1.3 — ABNORMAL HIGH (ref 0.8–1.2)
Prothrombin Time: 15.7 seconds — ABNORMAL HIGH (ref 11.4–15.2)

## 2020-09-24 LAB — LACTATE DEHYDROGENASE: LDH: 646 U/L — ABNORMAL HIGH (ref 98–192)

## 2020-09-24 LAB — FIBRINOGEN: Fibrinogen: 479 mg/dL — ABNORMAL HIGH (ref 210–475)

## 2020-09-24 LAB — RESP PANEL BY RT-PCR (FLU A&B, COVID) ARPGX2
Influenza A by PCR: NEGATIVE
Influenza B by PCR: NEGATIVE
SARS Coronavirus 2 by RT PCR: POSITIVE — AB

## 2020-09-24 LAB — APTT: aPTT: 27 seconds (ref 24–36)

## 2020-09-24 LAB — BRAIN NATRIURETIC PEPTIDE: B Natriuretic Peptide: 294 pg/mL — ABNORMAL HIGH (ref 0.0–100.0)

## 2020-09-24 LAB — FERRITIN: Ferritin: 478 ng/mL — ABNORMAL HIGH (ref 11–307)

## 2020-09-24 LAB — TROPONIN I (HIGH SENSITIVITY)
Troponin I (High Sensitivity): 260 ng/L (ref <18)
Troponin I (High Sensitivity): 504 ng/L (ref <18)

## 2020-09-24 LAB — LACTIC ACID, PLASMA
Lactic Acid, Venous: 4.9 mmol/L (ref 0.5–1.9)
Lactic Acid, Venous: 3.2 mmol/L (ref 0.5–1.9)

## 2020-09-24 LAB — PROCALCITONIN: Procalcitonin: <0.1 ng/mL

## 2020-09-24 LAB — FIBRIN DERIVATIVES D-DIMER (ARMC ONLY): Fibrin derivatives D-dimer (ARMC): >7500 ng/mL (FEU) — ABNORMAL HIGH (ref 0.00–499.00)

## 2020-09-24 LAB — MAGNESIUM: Magnesium: 1.9 mg/dL (ref 1.7–2.4)

## 2020-09-24 MED ORDER — SODIUM CHLORIDE 0.9 % IV SOLN
200.0000 mg | Freq: Once | INTRAVENOUS | Status: AC
  Administered 2020-09-24: 23:00:00 200 mg via INTRAVENOUS
  Filled 2020-09-24: qty 200

## 2020-09-24 MED ORDER — INSULIN ASPART 100 UNIT/ML ~~LOC~~ SOLN
0.0000 [IU] | SUBCUTANEOUS | Status: DC
  Administered 2020-09-25: 06:00:00 5 [IU] via SUBCUTANEOUS
  Administered 2020-09-25 (×3): 3 [IU] via SUBCUTANEOUS
  Administered 2020-09-25: 2 [IU] via SUBCUTANEOUS
  Administered 2020-09-25 – 2020-09-26 (×2): 3 [IU] via SUBCUTANEOUS
  Administered 2020-09-26: 18:00:00 2 [IU] via SUBCUTANEOUS
  Administered 2020-09-26 (×2): 3 [IU] via SUBCUTANEOUS
  Administered 2020-09-26: 04:00:00 2 [IU] via SUBCUTANEOUS
  Administered 2020-09-27: 04:00:00 3 [IU] via SUBCUTANEOUS
  Administered 2020-09-27 (×2): 2 [IU] via SUBCUTANEOUS
  Administered 2020-09-27 – 2020-09-28 (×4): 3 [IU] via SUBCUTANEOUS
  Administered 2020-09-28: 16:00:00 2 [IU] via SUBCUTANEOUS
  Administered 2020-09-28 (×3): 3 [IU] via SUBCUTANEOUS
  Administered 2020-09-29 (×2): 2 [IU] via SUBCUTANEOUS
  Administered 2020-09-29 – 2020-09-30 (×5): 3 [IU] via SUBCUTANEOUS
  Administered 2020-09-30: 04:00:00 2 [IU] via SUBCUTANEOUS
  Administered 2020-09-30 (×2): 3 [IU] via SUBCUTANEOUS
  Administered 2020-09-30: 14:00:00 5 [IU] via SUBCUTANEOUS
  Administered 2020-10-01: 20:00:00 3 [IU] via SUBCUTANEOUS
  Administered 2020-10-01: 23:00:00 2 [IU] via SUBCUTANEOUS
  Administered 2020-10-01: 17:00:00 5 [IU] via SUBCUTANEOUS
  Administered 2020-10-01 – 2020-10-02 (×2): 2 [IU] via SUBCUTANEOUS
  Administered 2020-10-02 (×3): 3 [IU] via SUBCUTANEOUS
  Administered 2020-10-02: 09:00:00 2 [IU] via SUBCUTANEOUS
  Administered 2020-10-03 (×2): 3 [IU] via SUBCUTANEOUS
  Administered 2020-10-03 (×2): 5 [IU] via SUBCUTANEOUS
  Administered 2020-10-03 – 2020-10-04 (×4): 3 [IU] via SUBCUTANEOUS
  Administered 2020-10-04 – 2020-10-05 (×4): 2 [IU] via SUBCUTANEOUS
  Administered 2020-10-06 (×3): 3 [IU] via SUBCUTANEOUS
  Administered 2020-10-06: 08:00:00 2 [IU] via SUBCUTANEOUS
  Administered 2020-10-07: 21:00:00 3 [IU] via SUBCUTANEOUS
  Administered 2020-10-07: 12:00:00 2 [IU] via SUBCUTANEOUS
  Administered 2020-10-07 – 2020-10-08 (×4): 3 [IU] via SUBCUTANEOUS
  Administered 2020-10-08: 18:00:00 5 [IU] via SUBCUTANEOUS
  Administered 2020-10-08: 11:00:00 3 [IU] via SUBCUTANEOUS
  Administered 2020-10-09 (×3): 5 [IU] via SUBCUTANEOUS
  Administered 2020-10-10: 3 [IU] via SUBCUTANEOUS
  Administered 2020-10-10: 5 [IU] via SUBCUTANEOUS
  Administered 2020-10-10: 2 [IU] via SUBCUTANEOUS
  Administered 2020-10-10: 3 [IU] via SUBCUTANEOUS
  Administered 2020-10-10: 5 [IU] via SUBCUTANEOUS
  Administered 2020-10-11: 2 [IU] via SUBCUTANEOUS
  Administered 2020-10-11: 5 [IU] via SUBCUTANEOUS
  Administered 2020-10-11: 3 [IU] via SUBCUTANEOUS
  Filled 2020-09-24 (×79): qty 1

## 2020-09-24 MED ORDER — ONDANSETRON HCL 4 MG/2ML IJ SOLN: 4.0000 mg | Freq: Four times a day (QID) | INTRAMUSCULAR | Status: DC | PRN

## 2020-09-24 MED ORDER — SODIUM CHLORIDE 0.9 % IV SOLN
100.0000 mg | Freq: Every day | INTRAVENOUS | Status: AC
  Administered 2020-09-25 – 2020-09-28 (×4): 100 mg via INTRAVENOUS
  Filled 2020-09-24 (×4): qty 100

## 2020-09-24 MED ORDER — BARICITINIB 2 MG PO TABS
4.0000 mg | ORAL_TABLET | Freq: Every day | ORAL | Status: DC
  Administered 2020-09-25: 09:00:00 4 mg via ORAL
  Filled 2020-09-24 (×2): qty 2

## 2020-09-24 MED ORDER — ACETAMINOPHEN 325 MG PO TABS: 650.0000 mg | ORAL_TABLET | Freq: Four times a day (QID) | ORAL | Status: DC | PRN

## 2020-09-24 MED ORDER — ASPIRIN 81 MG PO CHEW
324.0000 mg | CHEWABLE_TABLET | Freq: Once | ORAL | Status: AC
  Administered 2020-09-24: 21:00:00 324 mg via ORAL
  Filled 2020-09-24: qty 4

## 2020-09-24 MED ORDER — LACTATED RINGERS IV BOLUS (SEPSIS)
1000.0000 mL | Freq: Once | INTRAVENOUS | Status: AC
  Administered 2020-09-24: 21:00:00 1000 mL via INTRAVENOUS

## 2020-09-24 MED ORDER — SODIUM CHLORIDE 0.9 % IV SOLN
500.0000 mg | INTRAVENOUS | Status: DC
  Administered 2020-09-24: 22:00:00 500 mg via INTRAVENOUS
  Filled 2020-09-24 (×2): qty 500

## 2020-09-24 MED ORDER — LACTATED RINGERS IV SOLN: INTRAVENOUS | Status: AC

## 2020-09-24 MED ORDER — ONDANSETRON HCL 4 MG PO TABS: 4.0000 mg | ORAL_TABLET | Freq: Four times a day (QID) | ORAL | Status: DC | PRN

## 2020-09-24 MED ORDER — HEPARIN (PORCINE) 25000 UT/250ML-% IV SOLN
1100.0000 [IU]/h | INTRAVENOUS | Status: DC
  Administered 2020-09-24: 23:00:00 750 [IU]/h via INTRAVENOUS
  Administered 2020-09-25: 22:00:00 1000 [IU]/h via INTRAVENOUS
  Administered 2020-09-26 – 2020-09-27 (×2): 1100 [IU]/h via INTRAVENOUS
  Filled 2020-09-24 (×4): qty 250

## 2020-09-24 MED ORDER — FOLIC ACID 1 MG PO TABS: 1.0000 mg | ORAL_TABLET | Freq: Every day | ORAL | Status: DC

## 2020-09-24 MED ORDER — HEPARIN BOLUS VIA INFUSION
3800.0000 [IU] | Freq: Once | INTRAVENOUS | Status: AC
  Administered 2020-09-24: 23:00:00 3800 [IU] via INTRAVENOUS
  Filled 2020-09-24: qty 3800

## 2020-09-24 MED ORDER — SODIUM CHLORIDE 0.9 % IV SOLN
2.0000 g | INTRAVENOUS | Status: DC
  Administered 2020-09-24: 21:00:00 2 g via INTRAVENOUS
  Filled 2020-09-24 (×2): qty 20

## 2020-09-24 MED ORDER — ZINC SULFATE 220 (50 ZN) MG PO CAPS: 220.0000 mg | ORAL_CAPSULE | Freq: Every day | ORAL | Status: DC

## 2020-09-24 MED ORDER — ALBUTEROL SULFATE HFA 108 (90 BASE) MCG/ACT IN AERS
2.0000 | INHALATION_SPRAY | Freq: Four times a day (QID) | RESPIRATORY_TRACT | Status: DC
  Administered 2020-09-25 – 2020-09-26 (×2): 2 via RESPIRATORY_TRACT
  Filled 2020-09-24 (×3): qty 6.7

## 2020-09-24 MED ORDER — HYDROCHLOROTHIAZIDE 25 MG PO TABS
12.5000 mg | ORAL_TABLET | Freq: Every day | ORAL | Status: DC
Start: 2020-09-25 — End: 2020-09-25

## 2020-09-24 MED ORDER — ONDANSETRON HCL 4 MG/2ML IJ SOLN
4.0000 mg | Freq: Once | INTRAMUSCULAR | Status: AC
  Administered 2020-09-24: 21:00:00 4 mg via INTRAVENOUS

## 2020-09-24 MED ORDER — DEXAMETHASONE SODIUM PHOSPHATE 10 MG/ML IJ SOLN
6.0000 mg | Freq: Once | INTRAMUSCULAR | Status: AC
  Administered 2020-09-24: 20:00:00 6 mg via INTRAVENOUS
  Filled 2020-09-24: qty 1

## 2020-09-24 MED ORDER — MORPHINE SULFATE (PF) 2 MG/ML IV SOLN
1.0000 mg | INTRAVENOUS | Status: DC | PRN
  Administered 2020-09-24 – 2020-09-25 (×2): 1 mg via INTRAVENOUS
  Administered 2020-10-06 – 2020-10-21 (×17): 2 mg via INTRAVENOUS
  Filled 2020-09-24 (×20): qty 1

## 2020-09-24 MED ORDER — PREDNISONE 10 MG PO TABS: 50.0000 mg | ORAL_TABLET | Freq: Every day | ORAL | Status: DC

## 2020-09-24 MED ORDER — METHYLPREDNISOLONE SODIUM SUCC 125 MG IJ SOLR
1.0000 mg/kg | Freq: Two times a day (BID) | INTRAMUSCULAR | Status: DC
  Administered 2020-09-25: 09:00:00 63.75 mg via INTRAVENOUS
  Filled 2020-09-24: qty 2

## 2020-09-24 MED ORDER — GUAIFENESIN-DM 100-10 MG/5ML PO SYRP: 10.0000 mL | ORAL_SOLUTION | ORAL | Status: DC | PRN

## 2020-09-24 MED ORDER — THIAMINE HCL 100 MG PO TABS: 100.0000 mg | ORAL_TABLET | Freq: Every day | ORAL | Status: DC

## 2020-09-24 MED ORDER — ASCORBIC ACID 500 MG PO TABS: 500.0000 mg | ORAL_TABLET | Freq: Every day | ORAL | Status: DC

## 2020-09-24 MED ORDER — ADULT MULTIVITAMIN W/MINERALS CH: 1.0000 | ORAL_TABLET | Freq: Every day | ORAL | Status: DC

## 2020-09-24 MED ORDER — IBUPROFEN 600 MG PO TABS
600.0000 mg | ORAL_TABLET | Freq: Once | ORAL | Status: AC
  Administered 2020-09-24: 21:00:00 600 mg via ORAL
  Filled 2020-09-24: qty 1

## 2020-09-24 NOTE — ED Notes (Signed)
Oxygen increased with HFNC from 12L to 15L due to oxygen saturation of 86 percent. MD notified.

## 2020-09-24 NOTE — Telephone Encounter (Signed)
Called pt no answer left vm 

## 2020-09-24 NOTE — ED Triage Notes (Signed)
Pt from home via [facility]. Pt COVID positive 8 days ago. Pt SOB. EMS states room air saturation showed less than 50 percent. Pt on 15L NRB upon arrival. Oxygen saturation 81 percent. Pt diaphoretic.

## 2020-09-24 NOTE — Consult Note (Signed)
Remdesivir - Pharmacy Brief Note   O:  ALT: 29 SpO2: 98% on room air   A/P:  Remdesivir 200 mg IVPB once followed by 100 mg IVPB daily x 4 days.   Reatha Armour, PharmD Pharmacy Resident  10/09/2020 7:52 PM

## 2020-09-24 NOTE — ED Notes (Addendum)
Pt placed on 12L HFNC by respiratory in addition to 15L NRB. Attempted to remove NRB from pt to trial HFNC, pt oxygen saturation decreased to 82 percent with only 12L HFNC in place. 15L NRB returned to pt face. Oxygen saturations increased from 94-97 percent.

## 2020-09-24 NOTE — ED Notes (Signed)
Date and time results received: October 16, 2020 2027 (use smartphrase ".now" to insert current time)  Test: Lactic Acid Critical Value: 4.9  Name of Provider Notified: Katrinka Blazing MD  See new orders.

## 2020-09-24 NOTE — H&P (Signed)
NAME:  Margaret Shepard, MRN:  [id number], DOB:  1961/03/28, LOS: 0 ADMISSION DATE:  09-Oct-2020, CONSULTATION DATE: 10-09-20 REFERRING MD: Dr. Katrinka Blazing, CHIEF COMPLAINT: Shortness of Breath   Brief History:  59 yo female admitted with acute hypoxic respiratory failure secondary to pneumonia due to COVID-19 requiring HHFNC and NRB  History of Present Illness:  This is a 59 yo unvaccinated female who presented to [facility] ER on 12/16 via EMS with c/o worsening shortness of breath, cough, nausea, diarrhea, and poor po intake.  Per ER notes pt tested positive for COVID-19 8 days prior to ER presentation.  EMS reported pt severely hypoxic with O2 sats less than 50% on RA.  Therefore she was placed on 15L NRB with O2 sats increasing to 81%.  Upon arrival to the ER pt transitioned to [facility] and NRB due to continued hypoxia and tachypnea.  Lab results revealed CO2 19, glucose 174, anon gap 18, LDH 646, troponin 260, ferritin 478, pct <0.10, lactic acid 4.9, BNP 294, and vbg pH 7.41/pCO2 37/bicarb 23.5.  COVID-19 positive and CXR concerning for multifocal pneumonia.  There was also concern of possible pulmonary embolism, however due to severe hypoxia pt unable to tolerate transfer for CTA Chest.  Therefore, heparin gtt initiated.  Pt also received aspirin, decadron, ibuprofen, zofran, and remdesivir.  She was subsequently admitted to ICU per PCCM team for additional workup and treatment, but remained in the ER pending ICU bed availability.   Past Medical History:  Seizures Lumbago HTN Headaches Anxiety Anemia   Significant Hospital Events:  12/16: Pt admitted to ICU with COVID-19 pneumonia requiring HHFNC and NRB remained in the ER pending ICU bed availability   Consults:  Intensivist   Procedures:  None   Significant Diagnostic Tests:  12/16: CXR revealed multifocal pneumonia   Micro Data:  COVID-19 12/16>>positive  Influenza PCR 12/16>>negative  Urine 12/16>> Blood x2  12/16>>  Antimicrobials:  None   Interim History / Subjective:  Pt states her breathing has improved since she arrived at the hospital, however she complains of generalized pain and nausea requesting pain medication   Objective   Blood pressure (!) 158/115, pulse (!) 128, temperature 97.6 F (36.4 C), temperature source Oral, resp. rate (!) 33, height 5\' 4"  (1.626 m), weight 63.5 kg, SpO2 94 %.       No intake or output data in the 60 hours ending Oct 09, 2020 2252 Filed Weights   10/09/20 1939  Weight: 63.5 kg    Examination: General: acutely ill appearing female, in mild respiratory distress  HENT: supple, mild JVD present  Lungs: rhonchi throughout, mildly labored and tachypneic  Cardiovascular: sinus tach, no R/G, 2+ radial, 2+ distal pulses, no edema  Abdomen: +BS x4, soft, mild tenderness present , non distended  Extremities: normal bulk and tone, moves all extremities  Neuro: alert and oriented, follows commands   Assessment & Plan:   Acute hypoxic respiratory failure secondary to COVID-19 pneumonia  Supplemental O2 for dyspnea and/or hypoxia  Continue remdesivir, baricitinib, iv steroids, and vitamins  Scheduled and prn bronchodilator therapy  Encourage self-proning  Trend inflammatory markers  Trend WBC and monitor fever curve Trend PCT  Follow cultures  Continue azithromycin and ceftriaxone for CAP coverage Aggressive pulmonary hygiene Maintain airborne and contact precautions  HIGH RISK FOR MECHANICAL INTUBATION   Elevated troponin's secondary to demand ischemia in setting of respiratory failure vs. NSTEMI  Possible pulmonary emboli  HTN  Continuous telemetry monitoring  Trend troponin's Pt too unstable for  CTA Chest to r/o PE, US Venous Bilat Lower Extrem pending  Continue heparin gtt-dosing per pharmacy  Continue outpatient hydrochlorothiazide  Lactic acidosis  Trend lactic acid  Diarrhea  Gentle fluid resuscitation   Hyperglycemia  CBG's q4hrs   SSI  Hemoglobin A1c pending   Acute on chronic pain  Prn morphine for pain management   Best practice (evaluated daily)  Diet: NPO for now  Pain/Anxiety/Delirium protocol (if indicated): prn morphine for pain management  VAP protocol (if indicated): not indicated  DVT prophylaxis: heparin gtt  GI prophylaxis: not indicated  Glucose control: SSI  Mobility: Bedrest  Disposition: ICU   Goals of Care:  Last date of multidisciplinary goals of care discussion: N/A Family and staff present: N/A Summary of discussion: plan of care discussed with pt Follow up goals of care discussion due: 09/25/20 Code Status: Full code (discussed with pt)  Labs   CBC: Recent Labs  Lab 2020-10-02 1942  WBC 9.6  NEUTROABS 8.4*  HGB 13.9  HCT 42.1  MCV 85.6  PLT 323    Basic Metabolic Panel: Recent Labs  Lab 02-Oct-2020 1942  NA 136  K 3.8  CL 99  CO2 19*  GLUCOSE 174*  BUN 14  CREATININE 0.76  CALCIUM 8.2*  MG 1.9   GFR: Estimated Creatinine Clearance: 64.6 mL/min (by C-G formula based on SCr of 0.76 mg/dL). Recent Labs  Lab 02-Oct-2020 1942 Oct 02, 2020 1944 2020-10-02 2144  PROCALCITON <0.10  --   --   WBC 9.6  --   --   LATICACIDVEN  --  4.9* 3.2*    Liver Function Tests: Recent Labs  Lab 10/02/20 1942  AST 57*  ALT 32  ALKPHOS 86  BILITOT 1.0  PROT 7.8  ALBUMIN 3.1*   No results for input(s): LIPASE, AMYLASE in the last 168 hours. No results for input(s): AMMONIA in the last 168 hours.  ABG    Component Value Date/Time   HCO3 23.5 10-02-2020 1940   ACIDBASEDEF 0.8 02-Oct-2020 1940   O2SAT 22.2 October 02, 2020 1940     Coagulation Profile: Recent Labs  Lab 10-02-20 2037  INR 1.3*    Cardiac Enzymes: No results for input(s): CKTOTAL, CKMB, CKMBINDEX, TROPONINI in the last 168 hours.  HbA1C: No results found for: HGBA1C  CBG: No results for input(s): GLUCAP in the last 168 hours.  Review of Systems: Positives in BOLD   Gen: fever, chills, weight change,  fatigue, poor po intake, night sweats HEENT: Denies blurred vision, double vision, hearing loss, tinnitus, sinus congestion, rhinorrhea, sore throat, neck stiffness, dysphagia PULM: shortness of breath, cough, sputum production, hemoptysis, wheezing CV: Denies chest pain, edema, orthopnea, paroxysmal nocturnal dyspnea, palpitations GI: abdominal pain, nausea, vomiting, diarrhea, hematochezia, melena, constipation, change in bowel habits GU: Denies dysuria, hematuria, polyuria, oliguria, urethral discharge Endocrine: Denies hot or cold intolerance, polyuria, polyphagia or appetite change Derm: Denies rash, dry skin, scaling or peeling skin change Heme: Denies easy bruising, bleeding, bleeding gums Neuro: chronic pain, headache, numbness, weakness, slurred speech, loss of memory or consciousness   Past Medical History:  She,  has a past medical history of Anemia, Anxiety, Frequent headaches, Hypertension, Lumbago, and Seizures (HCC).   Surgical History:   Past Surgical History:  Procedure Laterality Date  . BRAIN SURGERY    . JOINT REPLACEMENT Bilateral   . JOINT REPLACEMENT Left    revision  . MANDIBLE SURGERY       Social History:   reports that she has quit smoking. She has  never used smokeless tobacco. She reports current alcohol use. She reports that she does not use drugs.   Family History:  Her family history includes Alcohol abuse in her father; Arthritis in her mother; COPD in her mother and sister; Cancer in her maternal grandmother; Hyperlipidemia in her mother; Hypertension in her sister; Kidney disease in her mother; Stroke in her sister.   Allergies Allergies  Allergen Reactions  . Hydrocodone-Acetaminophen Nausea And Vomiting and Itching     Home Medications  Prior to Admission medications   Medication Sig Start Date End Date Taking? Authorizing Provider  albuterol (VENTOLIN HFA) 108 (90 Base) MCG/ACT inhaler Inhale 2 puffs into the lungs every 4 (four) hours as  needed for wheezing or shortness of breath. 09/22/20  Yes Cannady, Jolene T, NP  cyclobenzaprine (FLEXERIL) 5 MG tablet Take 5 mg by mouth 3 (three) times daily as needed for muscle spasms.   Yes [provider]  Dextromethorphan-guaiFENesin (CORICIDIN HBP CONGESTION/COUGH) 10-200 MG CAPS Take 1 capsule by mouth every 4 (four) hours as needed. 09/22/20  Yes Cannady, Jolene T, NP  gabapentin (NEURONTIN) 300 MG capsule Take 300 mg by mouth 4 (four) times daily.   Yes [provider]  hydrochlorothiazide (HYDRODIURIL) 12.5 MG tablet TAKE 1 TABLET BY MOUTH ONCE DAILY. Patient taking differently: Take 12.5 mg by mouth daily. 09/08/20  Yes Cannady, Jolene T, NP  oxyCODONE (OXYCONTIN) 30 MG 12 hr tablet Take 30 mg by mouth 3 (three) times daily.   Yes [provider]  oxyCODONE (ROXICODONE) 15 MG immediate release tablet Take 15 mg by mouth every 4 (four) hours as needed (breakthrough pain).   Yes [provider]     Critical care time: 45 minutes     Sonda Rumble, AGNP  Pulmonary/Critical Care Pager [phone] (please enter 7 digits) PCCM Consult Pager [phone] (please enter 7 digits)

## 2020-09-24 NOTE — ED Notes (Signed)
Date and time results received: 10/07/2020 2041 (use smartphrase ".now" to insert current time)  Test: Troponin Critical Value: 260  Name of Provider Notified: Katrinka Blazing MD  See new orders.

## 2020-09-24 NOTE — ED Notes (Signed)
Respiratory at bedside. Pt given warm blankets.

## 2020-09-24 NOTE — ED Provider Notes (Signed)
[facility] Emergency Department Provider Note  ____________________________________________   Event Date/Time   First MD Initiated Contact with Patient Oct 15, 2019 1938     (approximate)  I have reviewed the triage vital signs and the nursing notes.   HISTORY  Chief Complaint Shortness of Breath   HPI [name] is a 59 y.o. female past medical history of anemia, anxiety, HTN and seizure disorder not on any AEDs who presents via EMS from home for assessment of shortness of breath after COVID-19 diagnosis approximately days ago.  Patient states she started feeling poorly 2 to 3 days before that but does have worsening shortness of breath, cough, nonbloody nonbilious vomiting, nonbloody diarrhea, aches, chills, decreased appetite, sore throat and fatigue.  No hemoptysis, urinary symptoms, rash or recent falls or injuries.  She denies tobacco abuse or any cardiopulmonary history otherwise.         Past Medical History:  Diagnosis Date  . Anemia    noted during hip surgery   . Anxiety    states no longer issue  . Frequent headaches   . Hypertension   . Lumbago   . Seizures (HCC)    after brain surgery, no longer has these    Patient Active Problem List   Diagnosis Date Noted  . Lab test positive for detection of COVID-19 virus 09/22/2020  . Chronic pain syndrome 07/15/2019  . Essential hypertension 07/15/2019  . Tooth abscess 07/15/2019  . DDD (degenerative disc disease), lumbar 03/09/2015  . DDD (degenerative disc disease), cervical 03/09/2015  . Status post cervical spinal fusion 03/09/2015  . History of total left hip replacement 03/09/2015  . 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 Bilateral occipital neuralgia 03/09/2015    Past Surgical History:  Procedure Laterality Date  . BRAIN SURGERY    . JOINT REPLACEMENT Bilateral   . JOINT REPLACEMENT Left    revision  . MANDIBLE SURGERY      Prior to Admission medications    Medication Sig Start Date End Date Taking? Authorizing Provider  albuterol (VENTOLIN HFA) 108 (90 Base) MCG/ACT inhaler Inhale 2 puffs into the lungs every 4 (four) hours as needed for wheezing or shortness of breath. 09/22/20  Yes Cannady, Jolene T, NP  cyclobenzaprine (FLEXERIL) 5 MG tablet Take 5 mg by mouth 3 (three) times daily as needed for muscle spasms.   Yes [provider]  Dextromethorphan-guaiFENesin (CORICIDIN HBP CONGESTION/COUGH) 10-200 MG CAPS Take 1 capsule by mouth every 4 (four) hours as needed. 09/22/20  Yes Cannady, Jolene T, NP  gabapentin (NEURONTIN) 300 MG capsule Take 300 mg by mouth 4 (four) times daily.   Yes [provider]  hydrochlorothiazide (HYDRODIURIL) 12.5 MG tablet TAKE 1 TABLET BY MOUTH ONCE DAILY. Patient taking differently: Take 12.5 mg by mouth daily. 09/08/20  Yes Cannady, Jolene T, NP  oxyCODONE (OXYCONTIN) 30 MG 12 hr tablet Take 30 mg by mouth 3 (three) times daily.   Yes [provider]  oxyCODONE (ROXICODONE) 15 MG immediate release tablet Take 15 mg by mouth every 4 (four) hours as needed (breakthrough pain).   Yes [provider]    Allergies Hydrocodone-acetaminophen  Family History  Problem Relation Age of Onset  . Arthritis Mother   . COPD Mother   . Hyperlipidemia Mother   . Kidney disease Mother   . Alcohol abuse Father   . COPD Sister   . Stroke Sister   . Hypertension Sister   . Cancer Maternal Grandmother  Social History Social History   Tobacco Use  . Smoking status: Former Games developer  . Smokeless tobacco: Never Used  . Tobacco comment: 8-9 years ago  Vaping Use  . Vaping Use: Never used  Substance Use Topics  . Alcohol use: Yes    Alcohol/week: 0.0 standard drinks    Comment: socially  . Drug use: No    Review of Systems  Review of Systems  Constitutional: Positive for chills, fever and malaise/fatigue.  HENT: Positive for congestion and sore throat.   Eyes: Negative for  pain.  Respiratory: Positive for cough and shortness of breath.   Cardiovascular: Positive for chest pain.  Gastrointestinal: Positive for diarrhea, nausea and vomiting.  Skin: Negative for rash.  Neurological: Negative for seizures, loss of consciousness and headaches.  Psychiatric/Behavioral: Negative for suicidal ideas.  All other systems reviewed and are negative.     ____________________________________________   PHYSICAL EXAM:  VITAL SIGNS: ED Triage Vitals  Enc Vitals Group     BP      Pulse      Resp      Temp      Temp src      SpO2      Weight      Height      Head Circumference      Peak Flow      Pain Score      Pain Loc      Pain Edu?      Excl. in GC?    Vitals:   10/03/2020 2100 2020/10/03 2130  BP: (!) 170/106 (!) 158/115  Pulse: (!) 131 (!) 128  Resp: (!) 26 (!) 33  Temp:    SpO2: (!) 85% 94%   Physical Exam Vitals and nursing note reviewed.  Constitutional:      General: She is in acute distress.     Appearance: She is well-developed and well-nourished. She is ill-appearing.  HENT:     Head: Normocephalic and atraumatic.     Right Ear: External ear normal.     Left Ear: External ear normal.     Nose: Nose normal.     Mouth/Throat:     Mouth: Mucous membranes are dry.  Eyes:     Conjunctiva/sclera: Conjunctivae normal.  Cardiovascular:     Rate and Rhythm: Regular rhythm. Tachycardia present.     Heart sounds: No murmur heard.   Pulmonary:     Effort: Tachypnea, accessory muscle usage and respiratory distress present.     Breath sounds: Decreased breath sounds and rhonchi present.  Abdominal:     Palpations: Abdomen is soft.     Tenderness: There is no abdominal tenderness.  Musculoskeletal:        General: No edema.     Cervical back: Neck supple.     Right lower leg: No edema.     Left lower leg: No edema.  Skin:    General: Skin is warm and dry.     Capillary Refill: Capillary refill takes 2 to 3 seconds.  Neurological:      Mental Status: She is alert and oriented to person, place, and time.  Psychiatric:        Mood and Affect: Mood and affect and mood normal.      ____________________________________________   LABS (all labs ordered are listed, but only abnormal results are displayed)  Labs Reviewed  RESP PANEL BY RT-PCR (FLU A&B, COVID) ARPGX2 - Abnormal; Notable for the following components:  Result Value   SARS Coronavirus 2 by RT PCR POSITIVE (*)    All other components within normal limits  LACTIC ACID, PLASMA - Abnormal; Notable for the following components:   Lactic Acid, Venous 4.9 (*)    All other components within normal limits  CBC WITH DIFFERENTIAL/PLATELET - Abnormal; Notable for the following components:   Neutro Abs 8.4 (*)    Lymphs Abs 0.6 (*)    Abs Immature Granulocytes 0.14 (*)    All other components within normal limits  COMPREHENSIVE METABOLIC PANEL - Abnormal; Notable for the following components:   CO2 19 (*)    Glucose, Bld 174 (*)    Calcium 8.2 (*)    Albumin 3.1 (*)    AST 57 (*)    Anion gap 18 (*)    All other components within normal limits  FIBRIN DERIVATIVES D-DIMER ([facility] ONLY) - Abnormal; Notable for the following components:   Fibrin derivatives D-dimer ([facility]) >7,500.00 (*)    All other components within normal limits  LACTATE DEHYDROGENASE - Abnormal; Notable for the following components:   LDH 646 (*)    All other components within normal limits  FERRITIN - Abnormal; Notable for the following components:   Ferritin 478 (*)    All other components within normal limits  FIBRINOGEN - Abnormal; Notable for the following components:   Fibrinogen 479 (*)    All other components within normal limits  BLOOD GAS, VENOUS - Abnormal; Notable for the following components:   pCO2, Ven 37 (*)    pO2, Ven <31.0 (*)    All other components within normal limits  BRAIN NATRIURETIC PEPTIDE - Abnormal; Notable for the following components:   B Natriuretic Peptide  294.0 (*)    All other components within normal limits  PROTIME-INR - Abnormal; Notable for the following components:   Prothrombin Time 15.7 (*)    INR 1.3 (*)    All other components within normal limits  TROPONIN I (HIGH SENSITIVITY) - Abnormal; Notable for the following components:   Troponin I (High Sensitivity) 260 (*)    All other components within normal limits  CULTURE, BLOOD (ROUTINE X 2)  CULTURE, BLOOD (ROUTINE X 2)  URINE CULTURE  PROCALCITONIN  TRIGLYCERIDES  MAGNESIUM  APTT  LACTIC ACID, PLASMA  C-REACTIVE PROTEIN  URINALYSIS, COMPLETE (UACMP) WITH MICROSCOPIC  HEMOGLOBIN A1C  HEPARIN LEVEL (UNFRACTIONATED)  CBC  POC URINE PREG, ED  TROPONIN I (HIGH SENSITIVITY)   ____________________________________________  EKG   Sinus tachycardia with ventricular rate of 137, normal axis, unremarkable's, and some artifact but no clear evidence of acute ischemia ____________________________________________  RADIOLOGY  ED MD interpretation: Bilateral multifocal opacities consistent with bilateral COVID-19 pneumonia. No pneumothorax, large effusion, overt edema or other acute intrathoracic process.  Official radiology report(s): DG Chest Port 1 View  Result Date: 10/06/2020 CLINICAL DATA:  COVID-19 EXAM: PORTABLE CHEST 1 VIEW COMPARISON:  01/10/2011 FINDINGS: Multifocal consolidation, worst in the lower right lung. No pneumothorax or sizable pleural effusion. Normal cardiomediastinal contours. IMPRESSION: Multifocal pneumonia. Electronically Signed   By: Deatra Robinson M.D.   On: 10/02/2020 20:15    ____________________________________________   PROCEDURES  Procedure(s) performed (including Critical Care):  .Critical Care Performed by: Gilles Chiquito, MD Authorized by: Gilles Chiquito, MD   Critical care provider statement:    Critical care time (minutes):  75   Critical care time was exclusive of:  Separately billable procedures and treating other patients    Critical care was necessary  to treat or prevent imminent or life-threatening deterioration of the following conditions:  Respiratory failure, sepsis and shock   Critical care was time spent personally by me on the following activities:  Discussions with consultants, evaluation of patient's response to treatment, examination of patient, ordering and performing treatments and interventions, ordering and review of laboratory studies, ordering and review of radiographic studies, pulse oximetry, re-evaluation of patient's condition, obtaining history from patient or surrogate and review of old charts     ____________________________________________   INITIAL IMPRESSION / ASSESSMENT AND PLAN / ED COURSE      Patient presents via EMS with above history and exam for assessment of worsening symptoms as well as acute hypoxic respiratory failure. On arrival patient was immediately transitioned to high flow with facemask placed over this and improvement SPO2 to 91%. However she is very tachycardic with a heart rate of 139, tachypneic at 34, with a BP of 164/97.   Chest x-ray shows findings consistent with bilateral multifocal Covid pneumonia.  No pneumothorax or clear evidence of volume overload.  VBG has pH of 7.41 with a PCO2 of 47 and no evidence of significant hypercarbic respiratory failure.  CBC with no evidence of leukocytosis or acute anemia.  BMP remarkable for hyperglycemia with glucose of 174 as well as anion gap acidosis with a bicarb of 19 and anion gap of 18.  Patient's albumin is slightly low at 3.1 and her AST is slightly elevated at 57.  D-dimer is elevated greater than 7500 and CTA chest was ordered to assess for presence of PE.  However at this time patient is too unstable to go to CTA as per RT she cannot go with high flow at this time.  Troponin is elevated to 260 which may reflect likely demand ischemia versus myocarditis.  Patient given ASA in ED and start on heparin for possible NSTEMI.   Magnesium is unremarkable.  Initial lactic acid is 4.9.  Given multiple SIRS criteria with elevated lactic acid blood cultures were obtained and patient was given IV fluids and antibiotics to cover for possible commune acquired pneumonia contributing to COVID-19 pneumonia.   Patient will be admitted to medical ICU service for further evaluation management.  ____________________________________________   FINAL CLINICAL IMPRESSION(S) / ED DIAGNOSES  Final diagnoses:  Acute respiratory failure with hypoxia (HCC)  Pneumonia due to COVID-19 virus  Lactic acid acidosis    Medications  remdesivir 200 mg in sodium chloride 0.9% 250 mL IVPB (has no administration in time range)    Followed by  remdesivir 100 mg in sodium chloride 0.9 % 100 mL IVPB (has no administration in time range)  lactated ringers infusion ( Intravenous New Bag/Given 09/29/2020 2058)  cefTRIAXone (ROCEPHIN) 2 g in sodium chloride 0.9 % 100 mL IVPB (0 g Intravenous Stopped 09/17/2020 2133)  azithromycin (ZITHROMAX) 500 mg in sodium chloride 0.9 % 250 mL IVPB (500 mg Intravenous New Bag/Given 09/17/2020 2136)  heparin ADULT infusion 100 units/mL (25000 units/267mL sodium chloride 0.45%) (has no administration in time range)  heparin bolus via infusion 3,800 Units (has no administration in time range)  dexamethasone (DECADRON) injection 6 mg (6 mg Intravenous Given 09/23/2020 2001)  lactated ringers bolus 1,000 mL (1,000 mLs Intravenous New Bag/Given 10/08/2020 2034)  ibuprofen (ADVIL) tablet 600 mg (600 mg Oral Given 09/22/2020 2041)  aspirin chewable tablet 324 mg (324 mg Oral Given 10/09/2020 2058)  ondansetron (ZOFRAN) injection 4 mg (4 mg Intravenous Given 09/29/2020 2056)     ED Discharge Orders  None       Note:  This document was prepared using Dragon voice recognition software and may include unintentional dictation errors.   Gilles Chiquito, MD 09/21/2020 2207

## 2020-09-24 NOTE — Consult Note (Signed)
CODE SEPSIS - PHARMACY COMMUNICATION  **Broad Spectrum Antibiotics should be administered within 1 hour of Sepsis diagnosis**  Time Code Sepsis Called/2046Page Received: 2046  Antibiotics Ordered: ceftriaxone azithromcyin  Time of 1st antibiotic administration: 2057   Ronnald Ramp ,PharmD Clinical Pharmacist  October 19, 2020  9:00 PM

## 2020-09-24 NOTE — Consult Note (Signed)
ANTICOAGULATION CONSULT NOTE - Initial Consult  Pharmacy Consult for Heparin Indication: chest pain/ACS  Allergies  Allergen Reactions  . Hydrocodone-Acetaminophen Nausea And Vomiting and Itching    Patient Measurements: Height: 5\' 4"  (162.6 cm) Weight: 63.5 kg (140 lb) IBW/kg (Calculated) : 54.7 Heparin Dosing Weight: 63.5 kg  Vital Signs: Temp: 97.6 F (36.4 C) (12/16 1941) Temp Source: Oral (12/16 1941) BP: 158/115 (12/16 2130) Pulse Rate: 128 (12/16 2130)  Labs: Recent Labs    09/25/2020 1942 10/03/2020 2037  HGB 13.9  --   HCT 42.1  --   PLT 323  --   APTT  --  27  LABPROT  --  15.7*  INR  --  1.3*  CREATININE 0.76  --   TROPONINIHS 260*  --     Estimated Creatinine Clearance: 64.6 mL/min (by C-G formula based on SCr of 0.76 mg/dL).   Medical History: Past Medical History:  Diagnosis Date  . Anemia    noted during hip surgery   . Anxiety    states no longer issue  . Frequent headaches   . Hypertension   . Lumbago   . Seizures (HCC)    after brain surgery, no longer has these    Medications:  (Not in a hospital admission)  Scheduled:   Infusions:  . azithromycin 500 mg (09/13/2020 2136)  . cefTRIAXone (ROCEPHIN)  IV Stopped (09/13/2020 2133)  . lactated ringers 150 mL/hr at 10/04/2020 2058  . remdesivir 200 mg in sodium chloride 0.9% 250 mL IVPB     Followed by  . [START ON 09/25/2020] remdesivir 100 mg in NS 100 mL     PRN:  Anti-infectives (From admission, onward)   Start     Dose/Rate Route Frequency Ordered Stop   09/25/20 1000  remdesivir 100 mg in sodium chloride 0.9 % 100 mL IVPB       "Followed by" Linked Group Details   100 mg 200 mL/hr over 30 Minutes Intravenous Daily 10/03/2020 1951 09/29/20 0959   09/09/2020 2100  remdesivir 200 mg in sodium chloride 0.9% 250 mL IVPB       "Followed by" Linked Group Details   200 mg 580 mL/hr over 30 Minutes Intravenous Once 09/26/2020 1951     09/13/2020 2045  cefTRIAXone (ROCEPHIN) 2 g in sodium chloride  0.9 % 100 mL IVPB        2 g 200 mL/hr over 30 Minutes Intravenous Every 24 hours 09/16/2020 2043     09/09/2020 2045  azithromycin (ZITHROMAX) 500 mg in sodium chloride 0.9 % 250 mL IVPB        500 mg 250 mL/hr over 60 Minutes Intravenous Every 24 hours 09/16/2020 2043        Assessment: Pharmacy consulted to start heparin for ACS. Trop elevated 260. CBC stable. No DOAC PTA.   Goal of Therapy:  Heparin level 0.3-0.7 units/ml Monitor platelets by anticoagulation protocol: Yes   Plan:  Give 3800 units bolus x 1 Start heparin infusion at 750 units/hr Check anti-Xa level in 6 hours and daily while on heparin Continue to monitor H&H and platelets  2044, PharmD, BCPS 10/09/2020,9:49 PM

## 2020-09-24 NOTE — Sepsis Progress Note (Signed)
Following for code sepsis monitoring 

## 2020-09-24 NOTE — ED Notes (Signed)
Respiratory called to switch pt oxygen to heated high flow per MD.

## 2020-09-25 ENCOUNTER — Inpatient Hospital Stay: Payer: Medicaid Other

## 2020-09-25 ENCOUNTER — Inpatient Hospital Stay: Payer: Self-pay

## 2020-09-25 DIAGNOSIS — U071 COVID-19: Secondary | ICD-10-CM

## 2020-09-25 LAB — BLOOD CULTURE ID PANEL (REFLEXED) - BCID2

## 2020-09-25 LAB — URINALYSIS, COMPLETE (UACMP) WITH MICROSCOPIC
Bacteria, UA: NONE SEEN
Bilirubin Urine: NEGATIVE
Glucose, UA: NEGATIVE mg/dL
Ketones, ur: 5 mg/dL — AB
Leukocytes,Ua: NEGATIVE
Nitrite: NEGATIVE
Protein, ur: 100 mg/dL — AB
Specific Gravity, Urine: 1.014 (ref 1.005–1.030)
pH: 5 (ref 5.0–8.0)

## 2020-09-25 LAB — BLOOD GAS, ARTERIAL
Acid-base deficit: 6.2 mmol/L — ABNORMAL HIGH (ref 0.0–2.0)
Acid-base deficit: 6 mmol/L — ABNORMAL HIGH (ref 0.0–2.0)
Bicarbonate: 25.5 mmol/L (ref 20.0–28.0)
Bicarbonate: 25.5 mmol/L (ref 20.0–28.0)
FIO2: 100
FIO2: 0.55
MECHVT: 450 mL
O2 Saturation: 95.5 %
O2 Saturation: 92.6 %
PEEP: 8 cmH2O
Patient temperature: 37
Patient temperature: 37
RATE: 30 resp/min
pCO2 arterial: 84 mmHg (ref 32.0–48.0)
pCO2 arterial: 82 mmHg (ref 32.0–48.0)
pH, Arterial: 7.09 — CL (ref 7.350–7.450)
pH, Arterial: 7.1 — CL (ref 7.350–7.450)
pO2, Arterial: 105 mmHg (ref 83.0–108.0)
pO2, Arterial: 88 mmHg (ref 83.0–108.0)

## 2020-09-25 LAB — COMPREHENSIVE METABOLIC PANEL
ALT: 41 U/L (ref 0–44)
AST: 89 U/L — ABNORMAL HIGH (ref 15–41)
Albumin: 2.7 g/dL — ABNORMAL LOW (ref 3.5–5.0)
Alkaline Phosphatase: 96 U/L (ref 38–126)
Anion gap: 14 (ref 5–15)
BUN: 21 mg/dL — ABNORMAL HIGH (ref 6–20)
CO2: 20 mmol/L — ABNORMAL LOW (ref 22–32)
Calcium: 7.8 mg/dL — ABNORMAL LOW (ref 8.9–10.3)
Chloride: 104 mmol/L (ref 98–111)
Creatinine, Ser: 1.14 mg/dL — ABNORMAL HIGH (ref 0.44–1.00)
GFR, Estimated: 55 mL/min — ABNORMAL LOW (ref >60)
Glucose, Bld: 213 mg/dL — ABNORMAL HIGH (ref 70–99)
Potassium: 4.7 mmol/L (ref 3.5–5.1)
Sodium: 138 mmol/L (ref 135–145)
Total Bilirubin: 1.1 mg/dL (ref 0.3–1.2)
Total Protein: 7 g/dL (ref 6.5–8.1)

## 2020-09-25 LAB — BLOOD GAS, VENOUS
Acid-base deficit: 0.8 mmol/L (ref 0.0–2.0)
Bicarbonate: 23.5 mmol/L (ref 20.0–28.0)
FIO2: 1
O2 Saturation: 22.2 %
Patient temperature: 37
pCO2, Ven: 37 mmHg — ABNORMAL LOW (ref 44.0–60.0)
pH, Ven: 7.41 (ref 7.250–7.430)
pO2, Ven: <31 mmHg — CL (ref 32.0–45.0)

## 2020-09-25 LAB — GLUCOSE, CAPILLARY
Glucose-Capillary: 140 mg/dL — ABNORMAL HIGH (ref 70–99)
Glucose-Capillary: 159 mg/dL — ABNORMAL HIGH (ref 70–99)
Glucose-Capillary: 171 mg/dL — ABNORMAL HIGH (ref 70–99)
Glucose-Capillary: 172 mg/dL — ABNORMAL HIGH (ref 70–99)
Glucose-Capillary: 179 mg/dL — ABNORMAL HIGH (ref 70–99)
Glucose-Capillary: 209 mg/dL — ABNORMAL HIGH (ref 70–99)
Glucose-Capillary: 226 mg/dL — ABNORMAL HIGH (ref 70–99)

## 2020-09-25 LAB — HEMOGLOBIN A1C
Hgb A1c MFr Bld: 6.6 % — ABNORMAL HIGH (ref 4.8–5.6)
Mean Plasma Glucose: 142.72 mg/dL

## 2020-09-25 LAB — FIBRIN DERIVATIVES D-DIMER (ARMC ONLY): Fibrin derivatives D-dimer (ARMC): >7500 ng/mL (FEU) — ABNORMAL HIGH (ref 0.00–499.00)

## 2020-09-25 LAB — CBC WITH DIFFERENTIAL/PLATELET
Abs Immature Granulocytes: 0.37 10*3/uL — ABNORMAL HIGH (ref 0.00–0.07)
Basophils Absolute: 0.1 10*3/uL (ref 0.0–0.1)
Basophils Relative: 1 %
Eosinophils Absolute: 0 10*3/uL (ref 0.0–0.5)
Eosinophils Relative: 0 %
HCT: 41.6 % (ref 36.0–46.0)
Hemoglobin: 13.3 g/dL (ref 12.0–15.0)
Immature Granulocytes: 2 %
Lymphocytes Relative: 4 %
Lymphs Abs: 0.7 10*3/uL (ref 0.7–4.0)
MCH: 29 pg (ref 26.0–34.0)
MCHC: 32 g/dL (ref 30.0–36.0)
MCV: 90.6 fL (ref 80.0–100.0)
Monocytes Absolute: 0.8 10*3/uL (ref 0.1–1.0)
Monocytes Relative: 5 %
Neutro Abs: 13.7 10*3/uL — ABNORMAL HIGH (ref 1.7–7.7)
Neutrophils Relative %: 88 %
Platelets: 270 10*3/uL (ref 150–400)
RBC: 4.59 MIL/uL (ref 3.87–5.11)
RDW: 13.4 % (ref 11.5–15.5)
WBC: 15.6 10*3/uL — ABNORMAL HIGH (ref 4.0–10.5)
nRBC: 0.1 % (ref 0.0–0.2)

## 2020-09-25 LAB — HIV ANTIBODY (ROUTINE TESTING W REFLEX): HIV Screen 4th Generation wRfx: NONREACTIVE

## 2020-09-25 LAB — URINE DRUG SCREEN, QUALITATIVE (ARMC ONLY)
Amphetamines, Ur Screen: NOT DETECTED
Barbiturates, Ur Screen: NOT DETECTED
Benzodiazepine, Ur Scrn: NOT DETECTED
Cannabinoid 50 Ng, Ur ~~LOC~~: NOT DETECTED
Cocaine Metabolite,Ur ~~LOC~~: NOT DETECTED
MDMA (Ecstasy)Ur Screen: NOT DETECTED
Methadone Scn, Ur: NOT DETECTED
Opiate, Ur Screen: POSITIVE — AB
Phencyclidine (PCP) Ur S: NOT DETECTED
Tricyclic, Ur Screen: POSITIVE — AB

## 2020-09-25 LAB — HEPARIN LEVEL (UNFRACTIONATED)
Heparin Unfractionated: 0.2 IU/mL — ABNORMAL LOW (ref 0.30–0.70)
Heparin Unfractionated: 0.26 IU/mL — ABNORMAL LOW (ref 0.30–0.70)

## 2020-09-25 LAB — MAGNESIUM: Magnesium: 2.1 mg/dL (ref 1.7–2.4)

## 2020-09-25 LAB — CBG MONITORING, ED: Glucose-Capillary: 169 mg/dL — ABNORMAL HIGH (ref 70–99)

## 2020-09-25 LAB — TRIGLYCERIDES: Triglycerides: 223 mg/dL — ABNORMAL HIGH (ref <150)

## 2020-09-25 LAB — PHOSPHORUS: Phosphorus: 6.4 mg/dL — ABNORMAL HIGH (ref 2.5–4.6)

## 2020-09-25 LAB — C-REACTIVE PROTEIN
CRP: 23.2 mg/dL — ABNORMAL HIGH (ref <1.0)
CRP: 27.4 mg/dL — ABNORMAL HIGH (ref <1.0)

## 2020-09-25 LAB — TROPONIN I (HIGH SENSITIVITY)
Troponin I (High Sensitivity): 713 ng/L (ref <18)
Troponin I (High Sensitivity): 773 ng/L (ref <18)

## 2020-09-25 LAB — FERRITIN: Ferritin: 400 ng/mL — ABNORMAL HIGH (ref 11–307)

## 2020-09-25 LAB — MRSA PCR SCREENING: MRSA by PCR: NEGATIVE

## 2020-09-25 MED ORDER — FENTANYL CITRATE (PF) 100 MCG/2ML IJ SOLN
100.0000 ug | Freq: Once | INTRAMUSCULAR | Status: AC
Start: 2020-09-25 — End: 2020-09-25
  Administered 2020-09-25: 03:00:00 100 ug via INTRAVENOUS

## 2020-09-25 MED ORDER — HEPARIN BOLUS VIA INFUSION
1000.0000 [IU] | Freq: Once | INTRAVENOUS | Status: AC
  Administered 2020-09-25: 12:00:00 1000 [IU] via INTRAVENOUS
  Filled 2020-09-25: qty 1000

## 2020-09-25 MED ORDER — THIAMINE HCL 100 MG PO TABS
100.0000 mg | ORAL_TABLET | Freq: Every day | ORAL | Status: DC
  Administered 2020-09-25 – 2020-10-21 (×27): 100 mg
  Filled 2020-09-25 (×28): qty 1

## 2020-09-25 MED ORDER — ORAL CARE MOUTH RINSE
15.0000 mL | OROMUCOSAL | Status: DC
  Administered 2020-09-25 – 2020-10-21 (×252): 15 mL via OROMUCOSAL

## 2020-09-25 MED ORDER — FENTANYL BOLUS VIA INFUSION
50.0000 ug | INTRAVENOUS | Status: DC | PRN
  Administered 2020-09-29 – 2020-10-03 (×15): 50 ug via INTRAVENOUS
  Filled 2020-09-25: qty 50

## 2020-09-25 MED ORDER — PANTOPRAZOLE SODIUM 40 MG IV SOLR
40.0000 mg | INTRAVENOUS | Status: DC
  Administered 2020-09-25 – 2020-10-09 (×15): 40 mg via INTRAVENOUS
  Filled 2020-09-25 (×16): qty 40

## 2020-09-25 MED ORDER — FUROSEMIDE 10 MG/ML IJ SOLN
40.0000 mg | Freq: Once | INTRAMUSCULAR | Status: AC
  Administered 2020-09-25: 02:00:00 40 mg via INTRAVENOUS
  Filled 2020-09-25: qty 4

## 2020-09-25 MED ORDER — ROCURONIUM BROMIDE 50 MG/5ML IV SOLN
50.0000 mg | Freq: Once | INTRAVENOUS | Status: AC
  Administered 2020-09-25: 03:00:00 50 mg via INTRAVENOUS
  Filled 2020-09-25: qty 5

## 2020-09-25 MED ORDER — ETOMIDATE 2 MG/ML IV SOLN
20.0000 mg | Freq: Once | INTRAVENOUS | Status: AC
  Administered 2020-09-25: 03:00:00 20 mg via INTRAVENOUS

## 2020-09-25 MED ORDER — INSULIN DETEMIR 100 UNIT/ML ~~LOC~~ SOLN
5.0000 [IU] | Freq: Two times a day (BID) | SUBCUTANEOUS | Status: DC
  Administered 2020-09-26 – 2020-10-11 (×30): 5 [IU] via SUBCUTANEOUS
  Filled 2020-09-25 (×33): qty 0.05

## 2020-09-25 MED ORDER — HEPARIN BOLUS VIA INFUSION
900.0000 [IU] | Freq: Once | INTRAVENOUS | Status: AC
  Administered 2020-09-25: 19:00:00 900 [IU] via INTRAVENOUS
  Filled 2020-09-25: qty 900

## 2020-09-25 MED ORDER — PROSOURCE TF PO LIQD
90.0000 mL | Freq: Three times a day (TID) | ORAL | Status: DC
  Administered 2020-09-25 – 2020-09-28 (×9): 90 mL
  Filled 2020-09-25 (×11): qty 90

## 2020-09-25 MED ORDER — CHLORHEXIDINE GLUCONATE CLOTH 2 % EX PADS
6.0000 | MEDICATED_PAD | Freq: Every day | CUTANEOUS | Status: DC
  Administered 2020-09-26 – 2020-10-09 (×14): 6 via TOPICAL

## 2020-09-25 MED ORDER — ROCURONIUM BROMIDE 50 MG/5ML IV SOLN
50.0000 mg | Freq: Once | INTRAVENOUS | Status: AC
  Administered 2020-09-25: 07:00:00 50 mg via INTRAVENOUS
  Filled 2020-09-25: qty 1

## 2020-09-25 MED ORDER — ASCORBIC ACID 500 MG PO TABS
500.0000 mg | ORAL_TABLET | Freq: Every day | ORAL | Status: DC
  Administered 2020-09-25 – 2020-10-21 (×27): 500 mg
  Filled 2020-09-25 (×27): qty 1

## 2020-09-25 MED ORDER — ONDANSETRON HCL 4 MG/2ML IJ SOLN: 4.0000 mg | Freq: Four times a day (QID) | INTRAMUSCULAR | Status: DC | PRN

## 2020-09-25 MED ORDER — MIDAZOLAM HCL 2 MG/2ML IJ SOLN
2.0000 mg | INTRAMUSCULAR | Status: DC | PRN
  Administered 2020-09-25 – 2020-10-09 (×16): 2 mg via INTRAVENOUS
  Filled 2020-09-25 (×9): qty 2

## 2020-09-25 MED ORDER — METHYLPREDNISOLONE SODIUM SUCC 125 MG IJ SOLR
1.0000 mg/kg | Freq: Two times a day (BID) | INTRAMUSCULAR | Status: AC
Start: 2020-09-25 — End: 2020-09-27
  Administered 2020-09-25 – 2020-09-27 (×5): 63.75 mg via INTRAVENOUS
  Filled 2020-09-25 (×5): qty 2

## 2020-09-25 MED ORDER — ZINC SULFATE 220 (50 ZN) MG PO CAPS
220.0000 mg | ORAL_CAPSULE | Freq: Every day | ORAL | Status: DC
Start: 2020-09-25 — End: 2020-10-21
  Administered 2020-09-25 – 2020-10-21 (×27): 220 mg
  Filled 2020-09-25 (×26): qty 1

## 2020-09-25 MED ORDER — DOCUSATE SODIUM 50 MG/5ML PO LIQD
100.0000 mg | Freq: Two times a day (BID) | ORAL | Status: DC
  Administered 2020-09-25 – 2020-09-28 (×8): 100 mg
  Filled 2020-09-25 (×10): qty 10

## 2020-09-25 MED ORDER — POLYETHYLENE GLYCOL 3350 17 G PO PACK
17.0000 g | PACK | Freq: Every day | ORAL | Status: DC
  Administered 2020-09-25 – 2020-09-27 (×3): 17 g
  Filled 2020-09-25 (×4): qty 1

## 2020-09-25 MED ORDER — MIDAZOLAM HCL 2 MG/2ML IJ SOLN
2.0000 mg | INTRAMUSCULAR | Status: AC
  Administered 2020-09-25: 07:00:00 2 mg via INTRAVENOUS
  Filled 2020-09-25: qty 2

## 2020-09-25 MED ORDER — ONDANSETRON HCL 4 MG PO TABS: 4.0000 mg | ORAL_TABLET | Freq: Four times a day (QID) | ORAL | Status: DC | PRN

## 2020-09-25 MED ORDER — BARICITINIB 2 MG PO TABS
2.0000 mg | ORAL_TABLET | Freq: Every day | ORAL | Status: DC
  Administered 2020-09-26: 09:00:00 2 mg
  Filled 2020-09-25: qty 1

## 2020-09-25 MED ORDER — FOLIC ACID 1 MG PO TABS
1.0000 mg | ORAL_TABLET | Freq: Every day | ORAL | Status: DC
  Administered 2020-09-25 – 2020-10-21 (×27): 1 mg
  Filled 2020-09-25 (×28): qty 1

## 2020-09-25 MED ORDER — ACETAMINOPHEN 325 MG PO TABS
650.0000 mg | ORAL_TABLET | Freq: Four times a day (QID) | ORAL | Status: DC | PRN
  Administered 2020-10-12 – 2020-10-20 (×4): 650 mg
  Filled 2020-09-25 (×4): qty 2

## 2020-09-25 MED ORDER — IOHEXOL 350 MG/ML SOLN
75.0000 mL | Freq: Once | INTRAVENOUS | Status: AC | PRN
  Administered 2020-09-25: 75 mL via INTRAVENOUS

## 2020-09-25 MED ORDER — SODIUM CHLORIDE 0.9 % IV SOLN
250.0000 mL | INTRAVENOUS | Status: DC
  Administered 2020-09-25 – 2020-10-20 (×8): 250 mL via INTRAVENOUS

## 2020-09-25 MED ORDER — FENTANYL 2500MCG IN NS 250ML (10MCG/ML) PREMIX INFUSION
0.0000 ug/h | INTRAVENOUS | Status: DC
  Administered 2020-09-25 (×2): 100 ug/h via INTRAVENOUS
  Administered 2020-09-26: 11:00:00 150 ug/h via INTRAVENOUS
  Administered 2020-09-27 – 2020-10-02 (×11): 200 ug/h via INTRAVENOUS
  Administered 2020-10-02: 18:00:00 350 ug/h via INTRAVENOUS
  Administered 2020-10-02: 11:00:00 300 ug/h via INTRAVENOUS
  Administered 2020-10-03 (×4): 350 ug/h via INTRAVENOUS
  Administered 2020-10-04 – 2020-10-06 (×10): 400 ug/h via INTRAVENOUS
  Filled 2020-09-25 (×29): qty 250

## 2020-09-25 MED ORDER — PHENYLEPHRINE HCL-NACL 10-0.9 MG/250ML-% IV SOLN
25.0000 ug/min | INTRAVENOUS | Status: DC
  Administered 2020-09-25: 07:00:00 25 ug/min via INTRAVENOUS
  Administered 2020-09-25: 17:00:00 15 ug/min via INTRAVENOUS
  Administered 2020-09-26: 01:00:00 25 ug/min via INTRAVENOUS
  Filled 2020-09-25 (×3): qty 250

## 2020-09-25 MED ORDER — PROPOFOL 1000 MG/100ML IV EMUL
5.0000 ug/kg/min | INTRAVENOUS | Status: DC
  Administered 2020-09-25: 03:00:00 20 ug/kg/min via INTRAVENOUS
  Administered 2020-09-25: 22:00:00 30 ug/kg/min via INTRAVENOUS
  Administered 2020-09-25: 08:00:00 55 ug/kg/min via INTRAVENOUS
  Administered 2020-09-25: 12:00:00 40 ug/kg/min via INTRAVENOUS
  Administered 2020-09-26: 06:00:00 30 ug/kg/min via INTRAVENOUS
  Administered 2020-09-26 (×2): 40 ug/kg/min via INTRAVENOUS
  Administered 2020-09-27: 08:00:00 30 ug/kg/min via INTRAVENOUS
  Administered 2020-09-27: 13:00:00 60 ug/kg/min via INTRAVENOUS
  Administered 2020-09-27: 22:00:00 40 ug/kg/min via INTRAVENOUS
  Administered 2020-09-27: 17:00:00 60 ug/kg/min via INTRAVENOUS
  Administered 2020-09-28 (×4): 70 ug/kg/min via INTRAVENOUS
  Administered 2020-09-28: 03:00:00 60 ug/kg/min via INTRAVENOUS
  Administered 2020-09-29: 13:00:00 80 ug/kg/min via INTRAVENOUS
  Administered 2020-09-29 (×2): 70 ug/kg/min via INTRAVENOUS
  Administered 2020-09-29: 09:00:00 60 ug/kg/min via INTRAVENOUS
  Administered 2020-09-29: 16:00:00 70 ug/kg/min via INTRAVENOUS
  Administered 2020-09-29: 04:00:00 60 ug/kg/min via INTRAVENOUS
  Administered 2020-09-30 – 2020-10-01 (×10): 70 ug/kg/min via INTRAVENOUS
  Filled 2020-09-25 (×32): qty 100

## 2020-09-25 MED ORDER — ADULT MULTIVITAMIN W/MINERALS CH
1.0000 | ORAL_TABLET | Freq: Every day | ORAL | Status: DC
  Administered 2020-09-26 – 2020-10-21 (×26): 1
  Filled 2020-09-25 (×26): qty 1

## 2020-09-25 MED ORDER — ADULT MULTIVITAMIN LIQUID CH
15.0000 mL | Freq: Every day | ORAL | Status: DC
  Administered 2020-09-25: 15 mL
  Filled 2020-09-25: qty 15

## 2020-09-25 MED ORDER — MIDAZOLAM HCL 2 MG/2ML IJ SOLN
2.0000 mg | INTRAMUSCULAR | Status: DC | PRN
  Administered 2020-09-25: 03:00:00 2 mg via INTRAVENOUS
  Filled 2020-09-25 (×2): qty 2

## 2020-09-25 MED ORDER — FENTANYL CITRATE (PF) 100 MCG/2ML IJ SOLN
50.0000 ug | Freq: Once | INTRAMUSCULAR | Status: DC
Start: 2020-09-25 — End: 2020-10-01

## 2020-09-25 MED ORDER — CHLORHEXIDINE GLUCONATE 0.12% ORAL RINSE (MEDLINE KIT)
15.0000 mL | Freq: Two times a day (BID) | OROMUCOSAL | Status: DC
  Administered 2020-09-25 – 2020-10-21 (×53): 15 mL via OROMUCOSAL

## 2020-09-25 MED ORDER — PREDNISONE 10 MG PO TABS
50.0000 mg | ORAL_TABLET | Freq: Every day | ORAL | Status: DC
  Administered 2020-09-28 – 2020-10-05 (×8): 50 mg
  Filled 2020-09-25 (×8): qty 5

## 2020-09-25 MED ORDER — VITAL HIGH PROTEIN PO LIQD
1000.0000 mL | ORAL | Status: DC
  Administered 2020-09-25 – 2020-09-27 (×3): 1000 mL

## 2020-09-25 NOTE — Progress Notes (Signed)
Inpatient Diabetes Program Recommendations  AACE/ADA: New Consensus Statement on Inpatient Glycemic Control   Target Ranges:  Prepandial:   less than 140 mg/dL      Peak postprandial:   less than 180 mg/dL (1-2 hours)      Critically ill patients:  140 - 180 mg/dL   Results for LADONYA, JERKINS (MRN [id number]) as of 09/25/2020 11:55  Ref. Range 09/25/2020 00:02 09/25/2020 02:55 09/25/2020 05:52 09/25/2020 09:22  Glucose-Capillary Latest Ref Range: 70 - 99 mg/dL 240 (H) 973 (H) 532 (H) 179 (H)   Review of Glycemic Control  Diabetes history: No Outpatient Diabetes medications: NA Current orders for Inpatient glycemic control: Novolog 0-15 units Q4H; Solumedrol 63.75 mg Q12H  Inpatient Diabetes Program Recommendations:    Insulin: If steroids continued as ordered, please consider ordering Levemir 5 units BID.  Thanks, Orlando Penner, RN, MSN, CDE Diabetes Coordinator Inpatient Diabetes Program [phone] (Team Pager from 8am to 5pm)

## 2020-09-25 NOTE — Progress Notes (Signed)
Secure chat with Dr. Belia Heman, in agreement will wait for negative BC results drawn 9/16 before placing PICC line.

## 2020-09-25 NOTE — Consult Note (Signed)
ANTICOAGULATION CONSULT NOTE - Initial Consult  Pharmacy Consult for Heparin Indication: chest pain/ACS  Allergies  Allergen Reactions  . Hydrocodone-Acetaminophen Nausea And Vomiting and Itching    Patient Measurements: Height: 5' 4" (162.6 cm) Weight: 64.4 kg (141 lb 15.6 oz) IBW/kg (Calculated) : 54.7 Heparin Dosing Weight: 63.5 kg  Vital Signs: Temp: 98.42 F (36.9 C) (12/17 1800) Temp Source: Rectal (12/17 1600) BP: 104/75 (12/17 1800) Pulse Rate: 88 (12/17 1800)  Labs: Recent Labs    09/29/2020 1942 09/30/2020 2037 09/14/2020 2144 09/25/20 0005 09/25/20 0621 09/25/20 1802  HGB 13.9  --   --   --  13.3  --   HCT 42.1  --   --   --  41.6  --   PLT 323  --   --   --  270  --   APTT  --  27  --   --   --   --   LABPROT  --  15.7*  --   --   --   --   INR  --  1.3*  --   --   --   --   HEPARINUNFRC  --   --   --   --  0.20* 0.26*  CREATININE 0.76  --   --   --  1.14*  --   TROPONINIHS 260*  --  504* 713* 773*  --     Estimated Creatinine Clearance: 45.3 mL/min (A) (by C-G formula based on SCr of 1.14 mg/dL (H)).   Medical History: Past Medical History:  Diagnosis Date  . Anemia    noted during hip surgery   . Anxiety    states no longer issue  . Frequent headaches   . Hypertension   . Lumbago   . Seizures ([place])    after brain surgery, no longer has these    Medications:  Medications Prior to Admission  Medication Sig Dispense Refill Last Dose  . albuterol (VENTOLIN HFA) 108 (90 Base) MCG/ACT inhaler Inhale 2 puffs into the lungs every 4 (four) hours as needed for wheezing or shortness of breath. 8 g 0 Unknown at PRN  . cyclobenzaprine (FLEXERIL) 5 MG tablet Take 5 mg by mouth 3 (three) times daily as needed for muscle spasms.   Unknown at PRN  . Dextromethorphan-guaiFENesin (CORICIDIN HBP CONGESTION/COUGH) 10-200 MG CAPS Take 1 capsule by mouth every 4 (four) hours as needed. 30 capsule 0 Unknown at PRN  . gabapentin (NEURONTIN) 300 MG capsule Take  600-1,200 mg by mouth daily.     . hydrochlorothiazide (HYDRODIURIL) 12.5 MG tablet TAKE 1 TABLET BY MOUTH ONCE DAILY. (Patient taking differently: Take 12.5 mg by mouth daily.) 90 tablet 0   . oxyCODONE (ROXICODONE) 15 MG immediate release tablet Take 15 mg by mouth every 4 (four) hours as needed (breakthrough pain).   Unknown at PRN  . oxyCODONE 30 MG 12 hr tablet Take 30 mg by mouth See admin instructions. Take 1 tablet (69m) by mouth every 8 to 12 hours      Scheduled:  . albuterol  2 puff Inhalation Q6H  . vitamin C  500 mg Per Tube Daily  . [START ON 09/26/2020] baricitinib  2 mg Per Tube Daily  . chlorhexidine gluconate (MEDLINE KIT)  15 mL Mouth Rinse BID  . [START ON 09/26/2020] Chlorhexidine Gluconate Cloth  6 each Topical Daily  . docusate  100 mg Per Tube BID  . feeding supplement (PROSource TF)  90 mL Per Tube  TID  . feeding supplement (VITAL HIGH PROTEIN)  1,000 mL Per Tube Q24H  . fentaNYL (SUBLIMAZE) injection  50 mcg Intravenous Once  . folic acid  1 mg Per Tube Daily  . heparin  900 Units Intravenous Once  . insulin aspart  0-15 Units Subcutaneous Q4H  . mouth rinse  15 mL Mouth Rinse 10 times per day  . [START ON 09/28/2020] predniSONE  50 mg Per Tube Daily   Followed by  . methylPREDNISolone (SOLU-MEDROL) injection  1 mg/kg Intravenous Q12H  . [START ON 09/26/2020] multivitamin with minerals  1 tablet Per Tube Daily  . pantoprazole (PROTONIX) IV  40 mg Intravenous Q24H  . polyethylene glycol  17 g Per Tube Daily  . thiamine  100 mg Per Tube Daily  . zinc sulfate  220 mg Per Tube Daily   Infusions:  . sodium chloride    . fentaNYL infusion INTRAVENOUS 100 mcg/hr (09/25/20 1703)  . heparin 900 Units/hr (09/25/20 1703)  . phenylephrine (NEO-SYNEPHRINE) Adult infusion 15 mcg/min (09/25/20 1703)  . propofol (DIPRIVAN) infusion 15 mcg/kg/min (09/25/20 1703)  . remdesivir 100 mg in NS 100 mL Stopped (09/25/20 0952)   PRN:  Anti-infectives (From admission, onward)    Start     Dose/Rate Route Frequency Ordered Stop   09/25/20 1000  remdesivir 100 mg in sodium chloride 0.9 % 100 mL IVPB       "Followed by" Linked Group Details   100 mg 200 mL/hr over 30 Minutes Intravenous Daily 09/27/2020 1951 09/29/20 0959   09/12/2020 2100  remdesivir 200 mg in sodium chloride 0.9% 250 mL IVPB       "Followed by" Linked Group Details   200 mg 580 mL/hr over 30 Minutes Intravenous Once 09/22/2020 1951 09/17/2020 2327   09/09/2020 2045  cefTRIAXone (ROCEPHIN) 2 g in sodium chloride 0.9 % 100 mL IVPB  Status:  Discontinued        2 g 200 mL/hr over 30 Minutes Intravenous Every 24 hours 09/17/2020 2043 09/25/20 1058   09/12/2020 2045  azithromycin (ZITHROMAX) 500 mg in sodium chloride 0.9 % 250 mL IVPB  Status:  Discontinued        500 mg 250 mL/hr over 60 Minutes Intravenous Every 24 hours 09/11/2020 2043 09/25/20 1058      Assessment: Pharmacy consulted to start heparin for ACS. Trop elevated 260. CBC stable. No DOAC PTA.   Goal of Therapy:  Heparin level 0.3-0.7 units/ml Monitor platelets by anticoagulation protocol: Yes   12/17 given 3800 units bolus x 1, Started heparin infusion at 750 units/hr 12/17 0621 HL = 0.2, bolus 1000 units, increase rate to 900 units/hr  12/17 1802 HL = 0.26,    Plan:   Give 900 unit bolus x 1, Increase infusion to 1000 units/hr  Check ant-Xa (HL) in 6 hours  continue to monitor H&H and platelets daily   Paulina Fusi, PharmD, BCPS 09/25/2020 7:00 PM

## 2020-09-25 NOTE — Procedures (Signed)
Endotracheal Intubation: Patient required ETT exchange utilizing bougie due to air leak.   Consent: Emergent.    Hand washing performed prior to starting the procedure.    Medications administered for sedation prior to procedure:  Fentanyl 100 mcg IV, Rocuronium 50 mg IV     A time out procedure was called and correct patient, name, & ID confirmed. Needed supplies and equipment were assembled and checked to include ETT, 10 ml syringe, Glidescope, Mac and Miller blades, suction, oxygen and bag mask valve, end tidal CO2 monitor.    Patient was positioned to align the mouth and pharynx to facilitate visualization of the glottis.    Heart rate, SpO2 and blood pressure was continuously monitored during the procedure. Pre-oxygenation was conducted prior to intubation and endotracheal tube was placed through the vocal cords into the trachea.       The artificial airway was placed under direct visualization via glidescope route using a 7.5   ETT on the first attempt.   ETT was secured at 24    .   Placement was confirmed by auscuitation of lungs with good breath sounds bilaterally and no stomach sounds.  Condensation was noted on endotracheal tube.   Pulse ox 100%     CO2 detector in place with appropriate color change.    Complications: None .        Chest radiograph ordered and pending.   Marda Stalker, [place] Pager [phone] (please enter 7 digits) PCCM Consult Pager [phone] (please enter 7 digits)

## 2020-09-25 NOTE — ED Notes (Signed)
Paged ICU NP about pt status. Pt respiratory rate 50-60 bpm with oxygen saturations between 79-82 percent. ICU NP advised she would be down to look at pt momentarily.

## 2020-09-25 NOTE — Progress Notes (Signed)
PHARMACY - PHYSICIAN COMMUNICATION CRITICAL VALUE ALERT - BLOOD CULTURE IDENTIFICATION (BCID)  Margaret Shepard is an 59 y.o. female who presented to [facility] on 09/10/2020 with a chief complaint of shortness of breath  Assessment:  COVID +.  Blood culture currently 1/4 bottles GPC with Staphylococcus species detected.  Appears to be contaminant  Name of physician (or Provider) Contacted: Dr Belia Heman  Current antibiotics: none  Changes to prescribed antibiotics recommended:  Recommendations accepted by provider - monitor off antibiotic as suspect contaminant  Results for orders placed or performed during the hospital encounter of 09/16/2020  Blood Culture ID Panel (Reflexed) (Collected: 10/04/2020  7:39 PM)  Result Value Ref Range   Enterococcus faecalis NOT DETECTED NOT DETECTED   Enterococcus Faecium NOT DETECTED NOT DETECTED   Listeria monocytogenes NOT DETECTED NOT DETECTED   Staphylococcus species DETECTED (A) NOT DETECTED   Staphylococcus aureus (BCID) NOT DETECTED NOT DETECTED   Staphylococcus epidermidis NOT DETECTED NOT DETECTED   Staphylococcus lugdunensis NOT DETECTED NOT DETECTED   Streptococcus species NOT DETECTED NOT DETECTED   Streptococcus agalactiae NOT DETECTED NOT DETECTED   Streptococcus pneumoniae NOT DETECTED NOT DETECTED   Streptococcus pyogenes NOT DETECTED NOT DETECTED   A.calcoaceticus-baumannii NOT DETECTED NOT DETECTED   Bacteroides fragilis NOT DETECTED NOT DETECTED   Enterobacterales NOT DETECTED NOT DETECTED   Enterobacter cloacae complex NOT DETECTED NOT DETECTED   Escherichia coli NOT DETECTED NOT DETECTED   Klebsiella aerogenes NOT DETECTED NOT DETECTED   Klebsiella oxytoca NOT DETECTED NOT DETECTED   Klebsiella pneumoniae NOT DETECTED NOT DETECTED   Proteus species NOT DETECTED NOT DETECTED   Salmonella species NOT DETECTED NOT DETECTED   Serratia marcescens NOT DETECTED NOT DETECTED   Haemophilus influenzae NOT DETECTED NOT DETECTED    Neisseria meningitidis NOT DETECTED NOT DETECTED   Pseudomonas aeruginosa NOT DETECTED NOT DETECTED   Stenotrophomonas maltophilia NOT DETECTED NOT DETECTED   Candida albicans NOT DETECTED NOT DETECTED   Candida auris NOT DETECTED NOT DETECTED   Candida glabrata NOT DETECTED NOT DETECTED   Candida krusei NOT DETECTED NOT DETECTED   Candida parapsilosis NOT DETECTED NOT DETECTED   Candida tropicalis NOT DETECTED NOT DETECTED   Cryptococcus neoformans/gattii NOT DETECTED NOT DETECTED    Juliette Alcide, PharmD, BCPS.   Work Cell: [phone] 09/25/2020 1:45 PM

## 2020-09-25 NOTE — Progress Notes (Signed)
Contacted pts friend Anabel Bene and her aunt Tomasa Blase per pts request, and informed them both pt required mechanical intubation due severe hypoxia.  All questions were answered and they both were appreciative of update.  Tomasa Blase will be the primary contact regarding daily updates.  Will continue to monitor and assess pt.  Sonda Rumble, AGNP  Pulmonary/Critical Care Pager [phone] (please enter 7 digits) PCCM Consult Pager [phone] (please enter 7 digits)

## 2020-09-25 NOTE — Procedures (Signed)
Endotracheal Intubation: Patient required placement of an artificial airway secondary to unresponsive state   Consent: Emergent.    Hand washing performed prior to starting the procedure.    Medications administered for sedation prior to procedure:  Fentanyl 100 mcg IV, 20 mg Etomidate IV, Rocuronium 50 mg IV     A time out procedure was called and correct patient, name, & ID confirmed. Needed supplies and equipment were assembled and checked to include ETT, 10 ml syringe, Glidescope, Mac and Miller blades, suction, oxygen and bag mask valve, end tidal CO2 monitor.    Patient was positioned to align the mouth and pharynx to facilitate visualization of the glottis.    Heart rate, SpO2 and blood pressure was continuously monitored during the procedure. Pre-oxygenation was conducted prior to intubation and endotracheal tube was placed through the vocal cords into the trachea.       The artificial airway was placed under direct visualization via glidescope route using a 7.5 ETT on the second attempt.   ETT was secured at .   Placement was confirmed by auscuitation of lungs with good breath sounds bilaterally and no stomach sounds.  Condensation was noted on endotracheal tube.   Pulse ox 98%  CO2 detector in place with appropriate color change.    Complications: None .        Chest radiograph ordered and pending.   Ms. Ripp arrived in ICU bed 16 alert/oriented, however she was hypoxic, tachypneic, and cyanotic on arrival.  While in the ER she initially refused mechanical intubation, however when she arrived in the ICU and following further discussion she agreed with proceeding with mechanical intubation.  Prior to mechanical intubation I asked Ms. Ley who she wanted me to contact once she was mechanically intubated, and she stated her [place] and her friend Gay Filler.  Therefore, will contact them both and update them regarding pts current condition.   Marda Stalker,  [place] Pager [phone] (please enter 7 digits) PCCM Consult Pager [phone] (please enter 7 digits)

## 2020-09-25 NOTE — Progress Notes (Signed)
CRITICAL CARE NOTE  59 yo female admitted with acute hypoxic respiratory failure secondary to pneumonia due to COVID-19 requiring HHFNC and NRB now intubated and severe resp failure complicated by DVT and probable acute PE  12/16 intubated 12/17 high risk for cardiac arrest, on pressors  CC  follow up respiratory failure  SUBJECTIVE Patient remains critically ill Prognosis is guarded   Vent Mode: PRVC FiO2 (%):  [100 %] 100 % Set Rate:  [28 bmp] 28 bmp Vt Set:  [450 mL] 450 mL PEEP:  [10 cmH20] 10 cmH20  CBC    Component Value Date/Time   WBC 15.6 (H) 09/25/2020 0621   RBC 4.59 09/25/2020 0621   HGB 13.3 09/25/2020 0621   HGB 13.6 07/08/2012 2019   HCT 41.6 09/25/2020 0621   HCT 39.4 07/08/2012 2019   PLT 270 09/25/2020 0621   PLT 202 07/08/2012 2019   MCV 90.6 09/25/2020 0621   MCV 91 07/08/2012 2019   MCH 29.0 09/25/2020 0621   MCHC 32.0 09/25/2020 0621   RDW 13.4 09/25/2020 0621   RDW 12.6 07/08/2012 2019   LYMPHSABS 0.7 09/25/2020 0621   MONOABS 0.8 09/25/2020 0621   EOSABS 0.0 09/25/2020 0621   BASOSABS 0.1 09/25/2020 0621   BMP Latest Ref Rng & Units 02/14/20 07/08/2012  Glucose 70 - 99 mg/dL 952(W174(H) 413(K104(H)  BUN 6 - 20 mg/dL 14 10  Creatinine 4.400.44 - 1.00 mg/dL 1.020.76 7.250.78  Sodium 366135 - 145 mmol/L 136 138  Potassium 3.5 - 5.1 mmol/L 3.8 3.7  Chloride 98 - 111 mmol/L 99 101  CO2 22 - 32 mmol/L 19(L) 32  Calcium 8.9 - 10.3 mg/dL 8.2(L) 9.1     BP 111/84   Pulse (!) 130   Temp 98.6 F (37 C)   Resp (!) 24   Ht 5\' 4"  (1.626 m)   Wt 63.5 kg   SpO2 99%   BMI 24.03 kg/m    I/O last 3 completed shifts: In: 1398.7 [I.V.:148.7; IV Piggyback:1250] Out: 350 [Urine:350] No intake/output data recorded.  SpO2: 99 % O2 Flow Rate (L/min): 15 L/min FiO2 (%): 100 %  Estimated body mass index is 24.03 kg/m as calculated from the following:   Height as of this encounter: 5\' 4"  (1.626 m).   Weight as of this encounter: 63.5 kg.  SIGNIFICANT  EVENTS   REVIEW OF SYSTEMS  PATIENT IS UNABLE TO PROVIDE COMPLETE REVIEW OF SYSTEMS DUE TO SEVERE CRITICAL ILLNESS      COVID-19 DISASTER DECLARATION:   FULL CONTACT PHYSICAL EXAMINATION WAS NOT POSSIBLE DUE TO TREATMENT OF COVID-19  AND CONSERVATION OF PERSONAL PROTECTIVE EQUIPMENT, LIMITED EXAM FINDINGS INCLUDE-   PHYSICAL EXAMINATION:  GENERAL:critically ill appearing, +resp distress NEUROLOGIC: obtunded, GCS<8   Patient assessed or the symptoms described in the history of present illness.  In the context of the Global COVID-19 pandemic, which necessitated consideration that the patient might be at risk for infection with the SARS-CoV-2 virus that causes COVID-19, Institutional protocols and algorithms that pertain to the evaluation of patients at risk for COVID-19 are in a state of rapid change based on information released by regulatory bodies including the CDC and federal and state organizations. These policies and algorithms were followed during the patient's care while in hospital.    MEDICATIONS: I have reviewed all medications and confirmed regimen as documented   CULTURE RESULTS   Recent Results (from the past 240 hour(s))  Resp Panel by RT-PCR (Flu A&B, Covid) Nasopharyngeal Swab  Status: Abnormal   Collection Time: October 02, 2020  7:40 PM   Specimen: Nasopharyngeal Swab; Nasopharyngeal(NP) swabs in vial transport medium  Result Value Ref Range Status   SARS Coronavirus 2 by RT PCR POSITIVE (A) NEGATIVE Final    Comment: RESULT CALLED TO, READ BACK BY AND VERIFIED WITH: KENDELL SHEETS @2138  ON 10/02/20 SKL (NOTE) SARS-CoV-2 target nucleic acids are DETECTED.  The SARS-CoV-2 RNA is generally detectable in upper respiratory specimens during the acute phase of infection. Positive results are indicative of the presence of the identified virus, but do not rule out bacterial infection or co-infection with other pathogens not detected by the test. Clinical correlation  with patient history and other diagnostic information is necessary to determine patient infection status. The expected result is Negative.  Fact Sheet for Patients: 09/26/20  Fact Sheet for Healthcare Providers: BloggerCourse.com  This test is not yet approved or cleared by the [employer] FDA and  has been authorized for detection and/or diagnosis of SARS-CoV-2 by FDA under an Emergency Use Authorization (EUA).  This EUA will remain in effect (meaning this test can  be used) for the duration of  the COVID-19 declaration under Section 564(b)(1) of the Act, 21 U.S.C. section 360bbb-3(b)(1), unless the authorization is terminated or revoked sooner.     Influenza A by PCR NEGATIVE NEGATIVE Final   Influenza B by PCR NEGATIVE NEGATIVE Final    Comment: (NOTE) The Xpert Xpress SARS-CoV-2/FLU/RSV plus assay is intended as an aid in the diagnosis of influenza from Nasopharyngeal swab specimens and should not be used as a sole basis for treatment. Nasal washings and aspirates are unacceptable for Xpert Xpress SARS-CoV-2/FLU/RSV testing.  Fact Sheet for Patients: [place]  Fact Sheet for Healthcare Providers: BloggerCourse.com  This test is not yet approved or cleared by the [employer]ious[employer] FDA and has been authorized for detection and/or diagnosis of SARS-CoV-2 by FDA under an Emergency Use Authorization (EUA). This EUA will remain in effect (meaning this test can be used) for the duration of the COVID-19 declaration under Section 564(b)(1) of the Act, 21 U.S.C. section 360bbb-3(b)(1), unless the authorization is terminated or revoked.  Performed at [facility], [address] [place], [place]   MRSA PCR Screening     Status: None   Collection Time: 09/25/20  4:07 AM   Specimen: Nasal Mucosa; Nasopharyngeal  Result Value Ref Range Status    MRSA by PCR NEGATIVE NEGATIVE Final    Comment:        The GeneXpert MRSA Assay (FDA approved for NASAL specimens only), is one component of a comprehensive MRSA colonization surveillance program. It is not intended to diagnose MRSA infection nor to guide or monitor treatment for MRSA infections. Performed at [facility], [address] [place], [place]           IMAGING    DG Abd 1 View  Result Date: 09/25/2020 CLINICAL DATA:  Tube placement EXAM: ABDOMEN - 1 VIEW COMPARISON:  None. FINDINGS: NG tube tip is in the distal stomach. Bilateral airspace disease again noted. IMPRESSION: NG tube tip in the distal stomach. Electronically Signed   By: 09/27/2020 M.D.   On: 09/25/2020 03:54   09/27/2020 Venous Img Lower Bilateral (DVT)  Result Date: 09/25/2020 CLINICAL DATA:  59 year old female with COVID-19. Bilateral lower extremity edema. EXAM: BILATERAL LOWER EXTREMITY VENOUS DOPPLER ULTRASOUND TECHNIQUE: Gray-scale sonography with graded compression, as well as color Doppler and duplex ultrasound were performed to evaluate the lower  extremity deep venous systems from the level of the common femoral vein and including the common femoral, femoral, profunda femoral, popliteal and calf veins including the posterior tibial, peroneal and gastrocnemius veins when visible. The superficial great saphenous vein was also interrogated. Spectral Doppler was utilized to evaluate flow at rest and with distal augmentation maneuvers in the common femoral, femoral and popliteal veins. COMPARISON:  No prior ultrasound. FINDINGS: RIGHT LOWER EXTREMITY Common Femoral Vein: No evidence of thrombus. Normal compressibility, respiratory phasicity and response to augmentation. Saphenofemoral Junction: No evidence of thrombus. Normal compressibility and flow on color Doppler imaging. Profunda Femoral Vein: No evidence of thrombus. Normal compressibility and flow on color Doppler imaging. Femoral Vein:  No evidence of thrombus. Normal compressibility, respiratory phasicity and response to augmentation. Popliteal Vein: The proximal popliteal vein appears compressible and patent (image 6), but there is echogenic thrombus in non compressible distal popliteal vein (image 9) and continuing into the calf veins. See image 27. Calf Veins: Thrombus.  See image 31. Other Findings:  None. LEFT LOWER EXTREMITY Common Femoral Vein: Echogenic thrombus in noncompressible vessel (image 32). Saphenofemoral Junction: Appears to remain patent (image 37). Profunda Femoral Vein: Appears to remain patent (image 41). Femoral Vein: Thrombus in the proximal and mid left femoral vein. The distal femoral vein appears to remain patent. Popliteal Vein: Incompressible with echogenic thrombus (images 57-59). Thrombus extends into the calf veins (image 61). Calf Veins: Thrombus.  See image 64. Other Findings:  None. IMPRESSION: Positive for BILATERAL lower extremity DVT, more extensive on the left. Electronically Signed   By: Odessa Fleming M.D.   On: 09/25/2020 06:59   DG Chest Port 1 View  Result Date: 09/25/2020 CLINICAL DATA:  Acute respiratory failure EXAM: PORTABLE CHEST 1 VIEW COMPARISON:  09/17/2020 FINDINGS: Endotracheal to is 4 cm above the carina. NG tube is in the stomach. Patchy bilateral airspace disease, right greater than left, similar to prior study. Heart is normal size. No effusions or pneumothorax. IMPRESSION: Support devices in expected position as above. Bilateral airspace disease, not significantly changed since prior study. Electronically Signed   By: Charlett Nose M.D.   On: 09/25/2020 03:54   DG Chest Port 1 View  Result Date: 09/12/2020 CLINICAL DATA:  COVID-19 EXAM: PORTABLE CHEST 1 VIEW COMPARISON:  01/10/2011 FINDINGS: Multifocal consolidation, worst in the lower right lung. No pneumothorax or sizable pleural effusion. Normal cardiomediastinal contours. IMPRESSION: Multifocal pneumonia. Electronically Signed   By:  Deatra Robinson M.D.   On: 09/13/2020 20:15     Nutrition Status:           Indwelling Urinary Catheter continued, requirement due to   Reason to continue Indwelling Urinary Catheter strict Intake/Output monitoring for hemodynamic instability   Central Line/ continued, requirement due to  Reason to continue [employer] Monitoring of central venous pressure or other hemodynamic parameters and poor IV access   Ventilator continued, requirement due to severe respiratory failure   Ventilator Sedation RASS 0 to -2      ASSESSMENT AND PLAN SYNOPSIS  59 yo white female with Acute hypoxemic respiratory failure due to COVID-19 pneumonia / ARDS Mechanical ventilation via ARDS protocol, target PRVC 6 cc/kg Wean PEEP and FiO2 as able Goal plateau pressure less than 30, driving pressure less than 15 Paralytics if necessary for vent synchrony, gas exchange Cycle prone positioning if necessary for oxygenation Deep sedation per PAD protocol, goal RASS -4, currently fentanyl, midazolam Diuresis as blood pressure and renal function can tolerate, goal CVP 5-8.  diuresis as tolerated based on Kidney function VAP prevention order set Remdesivir  IV STEROIDS/oral immunomodulator   Severe ACUTE Hypoxic and Hypercapnic Respiratory Failure -continue Full MV support -continue Bronchodilator Therapy -Wean Fio2 and PEEP as tolerated -VAP/VENT bundle implementation  Morbid obesity, possible OSA.   Will certainly impact respiratory mechanics, ventilator weaning Suspect will need to consider additional PEEP    NEUROLOGY Acute toxic metabolic encephalopathy, need for sedation Goal RASS -2 to -3  SHOCK-SEPSIS -use vasopressors to keep MAP>65   CARDIAC ICU monitoring  ID -continue IV abx as prescibed -follow up cultures Antibiotics Given (last 72 hours)    Date/Time Action Medication Dose Rate   10/09/2020 2057 New Bag/Given   cefTRIAXone (ROCEPHIN) 2 g in sodium chloride 0.9 % 100  mL IVPB 2 g 200 mL/hr   09/09/2020 2136 New Bag/Given   azithromycin (ZITHROMAX) 500 mg in sodium chloride 0.9 % 250 mL IVPB 500 mg 250 mL/hr   10/07/2020 2257 New Bag/Given   remdesivir 200 mg in sodium chloride 0.9% 250 mL IVPB 200 mg 580 mL/hr       GI GI PROPHYLAXIS as indicated   DIET-->TF's as tolerated Constipation protocol as indicated  ENDO - will use ICU hypoglycemic\Hyperglycemia protocol if indicated     ELECTROLYTES -follow labs as needed -replace as needed -pharmacy consultation and following   DVT/GI PRX ordered and assessed TRANSFUSIONS AS NEEDED MONITOR FSBS I Assessed the need for Labs I Assessed the need for Foley I Assessed the need for Central Venous Line Family Discussion when available I Assessed the need for Mobilization I made an Assessment of medications to be adjusted accordingly Safety Risk assessment completed   CASE DISCUSSED IN MULTIDISCIPLINARY ROUNDS WITH ICU TEAM  Critical Care Time devoted to patient care services described in this note is 55 minutes.   Overall, patient is critically ill, prognosis is guarded.  Patient with Multiorgan failure and at high risk for cardiac arrest and death.    Lucie Leather, M.D.  [facility]  Medical Director [facility] Medical Director [facility] Cardio-Pulmonary Department

## 2020-09-25 NOTE — ED Notes (Signed)
ICU NP at bedside. See new orders.

## 2020-09-25 NOTE — Progress Notes (Signed)
Initial Nutrition Assessment  DOCUMENTATION CODES:   Not applicable  INTERVENTION:  Initiate Vital High Protein at 20 mL/hr (480 mL goal daily volume) + PROSource TF 90 mL TID per tube. Provides 720 kcal, 108 grams of protein, 1274 mL H2O daily. With current propofol rate provides 1274 kcal daily.  Provide MVI tablet once daily per tube as it is more complete than liquid form of MVI.  NUTRITION DIAGNOSIS:   Inadequate oral intake related to inability to eat as evidenced by NPO status.  GOAL:   Patient will meet greater than or equal to 90% of their needs  MONITOR:   Vent status,Labs,Weight trends,TF tolerance,I & O's  REASON FOR ASSESSMENT:   Ventilator,Consult Enteral/tube feeding initiation and management  ASSESSMENT:   59 year old female with PMHx of HTN, seizures, anemia, anxiety admitted with COVID-19 PNA complicated by DVT and probable acute PE.   12/17 intubated  Patient is currently intubated on ventilator support MV: 15.4 L/min Temp (24hrs), Avg:98.3 F (36.8 C), Min:97.6 F (36.4 C), Max:99.32 F (37.4 C)  Propofol: 21 ml/hr (554 kcal daily)  Medications reviewed and include: vitamin C 500 mg daily, Colace 100 mg BID, folic acid 1 mg daily per tube, Novolog 0-15 units Q4hrs, Solu-Medrol 1 mg/kg Q12hrs IV, liquid MVI daily per tube, Protonix, Miralax, thiamine 100 mg daily per tube, zinc sulfate 220 mg daily per tube, fentanyl gtt, heparin infusion, propofol gtt, phenylephrine gtt at 30 mcg/min, remdesivir.  Labs reviewed: CBG 172-179, CO2 20, BUN 21, Creatinine 1.14, Phosphorus 6.4.  I/O: 350 mL UOP yesterday  Enteral Access: OGT placed 12/17; terminates in distal stomach per abdominal x-ray 12/17  Discussed with RN and on rounds. Plan is to start tube feeds today.  NUTRITION - FOCUSED PHYSICAL EXAM:  Flowsheet Row Most Recent Value  Orbital Region No depletion  Upper Arm Region Mild depletion  Thoracic and Lumbar Region No depletion  Buccal Region  Unable to assess  Temple Region No depletion  Clavicle Bone Region No depletion  Clavicle and Acromion Bone Region No depletion  Scapular Bone Region Unable to assess  Dorsal Hand No depletion  Patellar Region Mild depletion  Anterior Thigh Region Mild depletion  Posterior Calf Region Mild depletion  Edema (RD Assessment) None  Hair Reviewed  Eyes Unable to assess  Mouth Unable to assess  Skin Reviewed  Nails Reviewed     Diet Order:   Diet Order            Diet NPO time specified  Diet effective now                EDUCATION NEEDS:   No education needs have been identified at this time  Skin:  Skin Assessment: Reviewed RN Assessment  Last BM:  Unknown/PTA  Height:   Ht Readings from Last 1 Encounters:  2020/10/13 5\' 4"  (1.626 m)   Weight:   Wt Readings from Last 1 Encounters:  09/25/20 64.4 kg   Ideal Body Weight:  54.5 kg  BMI:  Body mass index is 24.37 kg/m.  Estimated Nutritional Needs:   Kcal:  1236  Protein:  97-110 grams  Fluid:  1.9-2.2 L/day  09/27/20, MS, RD, LDN Pager number available on Amion

## 2020-09-25 NOTE — Consult Note (Signed)
ANTICOAGULATION CONSULT NOTE - Initial Consult  Pharmacy Consult for Heparin Indication: chest pain/ACS  Allergies  Allergen Reactions  . Hydrocodone-Acetaminophen Nausea And Vomiting and Itching    Patient Measurements: Height: '5\' 4"'  (162.6 cm) Weight: 63.5 kg (140 lb) IBW/kg (Calculated) : 54.7 Heparin Dosing Weight: 63.5 kg  Vital Signs: Temp: 98.06 F (36.7 C) (12/17 1000) Temp Source: Rectal (12/17 0800) BP: 81/64 (12/17 1000) Pulse Rate: 102 (12/17 1000)  Labs: Recent Labs    09/11/2020 1942 10/04/2020 2037 09/30/2020 2144 09/25/20 0005 09/25/20 0621  HGB 13.9  --   --   --  13.3  HCT 42.1  --   --   --  41.6  PLT 323  --   --   --  270  APTT  --  27  --   --   --   LABPROT  --  15.7*  --   --   --   INR  --  1.3*  --   --   --   HEPARINUNFRC  --   --   --   --  0.20*  CREATININE 0.76  --   --   --  1.14*  TROPONINIHS 260*  --  504* 713* 773*    Estimated Creatinine Clearance: 45.3 mL/min (A) (by C-G formula based on SCr of 1.14 mg/dL (H)).   Medical History: Past Medical History:  Diagnosis Date  . Anemia    noted during hip surgery   . Anxiety    states no longer issue  . Frequent headaches   . Hypertension   . Lumbago   . 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 albuterol (VENTOLIN HFA) 108 (90 Base) MCG/ACT inhaler Inhale 2 puffs into the lungs every 4 (four) hours as needed for wheezing or shortness of breath. 8 g 0 Unknown at PRN  . cyclobenzaprine (FLEXERIL) 5 MG tablet Take 5 mg by mouth 3 (three) times daily as needed for muscle spasms.   Unknown at PRN  . Dextromethorphan-guaiFENesin (CORICIDIN HBP CONGESTION/COUGH) 10-200 MG CAPS Take 1 capsule by mouth every 4 (four) hours as needed. 30 capsule 0 Unknown at PRN  . gabapentin (NEURONTIN) 300 MG capsule Take 600-1,200 mg by mouth daily.     . hydrochlorothiazide (HYDRODIURIL) 12.5  MG tablet TAKE 1 TABLET BY MOUTH ONCE DAILY. (Patient taking differently: Take 12.5 mg by mouth daily.) 90 tablet 0   . oxyCODONE (ROXICODONE) 15 MG immediate release tablet Take 15 mg by mouth every 4 (four) hours as needed (breakthrough pain).   Unknown at PRN  . oxyCODONE 30 MG 12 hr tablet Take 30 mg by mouth See admin instructions. Take 1 tablet (43m) by mouth every 8 to 12 hours      Scheduled:  . albuterol  2 puff Inhalation Q6H  . vitamin C  500 mg Per Tube Daily  . [START ON 09/26/2020] baricitinib  2 mg Per Tube Daily  . chlorhexidine gluconate (MEDLINE KIT)  15 mL Mouth Rinse BID  . [START ON 09/26/2020] Chlorhexidine Gluconate Cloth  6 each Topical Daily  . docusate  100 mg Per Tube BID  . fentaNYL (SUBLIMAZE) injection  50 mcg Intravenous Once  . folic acid  1 mg Per Tube Daily  . insulin aspart  0-15 Units Subcutaneous Q4H  . mouth rinse  15 mL Mouth Rinse 10 times  per day  . [START ON 09/28/2020] predniSONE  50 mg Per Tube Daily   Followed by  . methylPREDNISolone (SOLU-MEDROL) injection  1 mg/kg Intravenous Q12H  . multivitamin  15 mL Per Tube Daily  . pantoprazole (PROTONIX) IV  40 mg Intravenous Q24H  . polyethylene glycol  17 g Per Tube Daily  . thiamine  100 mg Per Tube Daily  . zinc sulfate  220 mg Per Tube Daily   Infusions:  . sodium chloride    . fentaNYL infusion INTRAVENOUS 200 mcg/hr (09/25/20 1000)  . heparin 900 Units/hr (09/25/20 1205)  . lactated ringers 150 mL/hr at 09/17/2020 2058  . phenylephrine (NEO-SYNEPHRINE) Adult infusion 30 mcg/min (09/25/20 1000)  . propofol (DIPRIVAN) infusion 55 mcg/kg/min (09/25/20 1000)  . remdesivir 100 mg in NS 100 mL Stopped (09/25/20 0952)   PRN:  Anti-infectives (From admission, onward)   Start     Dose/Rate Route Frequency Ordered Stop   09/25/20 1000  remdesivir 100 mg in sodium chloride 0.9 % 100 mL IVPB       "Followed by" Linked Group Details   100 mg 200 mL/hr over 30 Minutes Intravenous Daily 10/08/2020  1951 09/29/20 0959   10/01/2020 2100  remdesivir 200 mg in sodium chloride 0.9% 250 mL IVPB       "Followed by" Linked Group Details   200 mg 580 mL/hr over 30 Minutes Intravenous Once 09/18/2020 1951 09/17/2020 2327   09/27/2020 2045  cefTRIAXone (ROCEPHIN) 2 g in sodium chloride 0.9 % 100 mL IVPB  Status:  Discontinued        2 g 200 mL/hr over 30 Minutes Intravenous Every 24 hours 09/23/2020 2043 09/25/20 1058   09/21/2020 2045  azithromycin (ZITHROMAX) 500 mg in sodium chloride 0.9 % 250 mL IVPB  Status:  Discontinued        500 mg 250 mL/hr over 60 Minutes Intravenous Every 24 hours 09/26/2020 2043 09/25/20 1058      Assessment: Pharmacy consulted to start heparin for ACS. Trop elevated 260. CBC stable. No DOAC PTA.   Goal of Therapy:  Heparin level 0.3-0.7 units/ml Monitor platelets by anticoagulation protocol: Yes   12/17 given 3800 units bolus x 1, Started heparin infusion at 750 units/hr 12/17 0621 HL = 0.2, subtherapeutic    Plan:   Give 1000 unit bolus x 1, Increase infusion to 900 units/hr  Check ant-Xa (HL) in 6 hours  continue to monitor H&H and platelets daily   Dorothe Pea, PharmD, BCPS 09/25/2020,2:19 PM

## 2020-09-26 ENCOUNTER — Inpatient Hospital Stay: Payer: Medicaid Other

## 2020-09-26 ENCOUNTER — Inpatient Hospital Stay (HOSPITAL_COMMUNITY)
Admit: 2020-09-26 | Discharge: 2020-09-26 | Disposition: A | Discharge status: Home / Self Care | Payer: Medicaid Other | Attending: Critical Care Medicine | Admitting: Critical Care Medicine

## 2020-09-26 DIAGNOSIS — J9601 Acute respiratory failure with hypoxia: Secondary | ICD-10-CM

## 2020-09-26 DIAGNOSIS — I361 Nonrheumatic tricuspid (valve) insufficiency: Secondary | ICD-10-CM

## 2020-09-26 LAB — COMPREHENSIVE METABOLIC PANEL
ALT: 35 U/L (ref 0–44)
AST: 50 U/L — ABNORMAL HIGH (ref 15–41)
Albumin: 2.3 g/dL — ABNORMAL LOW (ref 3.5–5.0)
Alkaline Phosphatase: 91 U/L (ref 38–126)
Anion gap: 12 (ref 5–15)
BUN: 46 mg/dL — ABNORMAL HIGH (ref 6–20)
CO2: 21 mmol/L — ABNORMAL LOW (ref 22–32)
Calcium: 7.7 mg/dL — ABNORMAL LOW (ref 8.9–10.3)
Chloride: 105 mmol/L (ref 98–111)
Creatinine, Ser: 2.73 mg/dL — ABNORMAL HIGH (ref 0.44–1.00)
GFR, Estimated: 19 mL/min — ABNORMAL LOW (ref >60)
Glucose, Bld: 169 mg/dL — ABNORMAL HIGH (ref 70–99)
Potassium: 3.7 mmol/L (ref 3.5–5.1)
Sodium: 138 mmol/L (ref 135–145)
Total Bilirubin: 0.7 mg/dL (ref 0.3–1.2)
Total Protein: 5.9 g/dL — ABNORMAL LOW (ref 6.5–8.1)

## 2020-09-26 LAB — CBC WITH DIFFERENTIAL/PLATELET
Abs Immature Granulocytes: 0.17 10*3/uL — ABNORMAL HIGH (ref 0.00–0.07)
Basophils Absolute: 0 10*3/uL (ref 0.0–0.1)
Basophils Relative: 0 %
Eosinophils Absolute: 0 10*3/uL (ref 0.0–0.5)
Eosinophils Relative: 0 %
HCT: 32.8 % — ABNORMAL LOW (ref 36.0–46.0)
Hemoglobin: 10.6 g/dL — ABNORMAL LOW (ref 12.0–15.0)
Immature Granulocytes: 2 %
Lymphocytes Relative: 11 %
Lymphs Abs: 1.3 10*3/uL (ref 0.7–4.0)
MCH: 28.6 pg (ref 26.0–34.0)
MCHC: 32.3 g/dL (ref 30.0–36.0)
MCV: 88.4 fL (ref 80.0–100.0)
Monocytes Absolute: 0.7 10*3/uL (ref 0.1–1.0)
Monocytes Relative: 6 %
Neutro Abs: 9 10*3/uL — ABNORMAL HIGH (ref 1.7–7.7)
Neutrophils Relative %: 81 %
Platelets: 276 10*3/uL (ref 150–400)
RBC: 3.71 MIL/uL — ABNORMAL LOW (ref 3.87–5.11)
RDW: 13.8 % (ref 11.5–15.5)
WBC: 11.1 10*3/uL — ABNORMAL HIGH (ref 4.0–10.5)
nRBC: 0.2 % (ref 0.0–0.2)

## 2020-09-26 LAB — HEPARIN LEVEL (UNFRACTIONATED)
Heparin Unfractionated: 0.29 IU/mL — ABNORMAL LOW (ref 0.30–0.70)
Heparin Unfractionated: 0.42 IU/mL (ref 0.30–0.70)
Heparin Unfractionated: 0.4 IU/mL (ref 0.30–0.70)

## 2020-09-26 LAB — ECHOCARDIOGRAM COMPLETE
AR max vel: 1.49 cm2
AV Peak grad: 7.3 mmHg
Ao pk vel: 1.35 m/s
Area-P 1/2: 4.68 cm2
Height: 64 in
S' Lateral: 3.3 cm
Weight: 2275.15 oz

## 2020-09-26 LAB — PHOSPHORUS: Phosphorus: 3.8 mg/dL (ref 2.5–4.6)

## 2020-09-26 LAB — URINE CULTURE: Culture: NO GROWTH

## 2020-09-26 LAB — GLUCOSE, CAPILLARY
Glucose-Capillary: 150 mg/dL — ABNORMAL HIGH (ref 70–99)
Glucose-Capillary: 179 mg/dL — ABNORMAL HIGH (ref 70–99)
Glucose-Capillary: 152 mg/dL — ABNORMAL HIGH (ref 70–99)
Glucose-Capillary: 140 mg/dL — ABNORMAL HIGH (ref 70–99)
Glucose-Capillary: 155 mg/dL — ABNORMAL HIGH (ref 70–99)
Glucose-Capillary: 162 mg/dL — ABNORMAL HIGH (ref 70–99)

## 2020-09-26 LAB — FERRITIN: Ferritin: 878 ng/mL — ABNORMAL HIGH (ref 11–307)

## 2020-09-26 LAB — FIBRIN DERIVATIVES D-DIMER (ARMC ONLY): Fibrin derivatives D-dimer (ARMC): >7500 ng/mL (FEU) — ABNORMAL HIGH (ref 0.00–499.00)

## 2020-09-26 LAB — MAGNESIUM: Magnesium: 2.2 mg/dL (ref 1.7–2.4)

## 2020-09-26 LAB — C-REACTIVE PROTEIN: CRP: 18.9 mg/dL — ABNORMAL HIGH (ref <1.0)

## 2020-09-26 MED ORDER — BARICITINIB 1 MG PO TABS
1.0000 mg | ORAL_TABLET | Freq: Every day | ORAL | Status: DC
  Administered 2020-09-27 – 2020-09-29 (×3): 1 mg
  Filled 2020-09-26 (×3): qty 1

## 2020-09-26 MED ORDER — VANCOMYCIN HCL 1500 MG/300ML IV SOLN
1500.0000 mg | Freq: Once | INTRAVENOUS | Status: AC
  Administered 2020-09-26: 15:00:00 1500 mg via INTRAVENOUS
  Filled 2020-09-26: qty 300

## 2020-09-26 MED ORDER — HEPARIN BOLUS VIA INFUSION
900.0000 [IU] | Freq: Once | INTRAVENOUS | Status: AC
  Administered 2020-09-26: 02:00:00 900 [IU] via INTRAVENOUS
  Filled 2020-09-26: qty 900

## 2020-09-26 MED ORDER — VANCOMYCIN VARIABLE DOSE PER UNSTABLE RENAL FUNCTION (PHARMACIST DOSING): Status: DC

## 2020-09-26 MED ORDER — LACTATED RINGERS IV SOLN: INTRAVENOUS | Status: DC

## 2020-09-26 NOTE — Progress Notes (Addendum)
CRITICAL CARE NOTE  59 yo female admitted with acute hypoxic respiratory failure secondary to pneumonia due to COVID-19 requiring HHFNC and NRB now intubated and severe resp failure complicated by DVT and PE  12/16 intubated 12/17 high risk for cardiac arrest, on pressors 12/18 Off pressors  CC  follow up respiratory failure  SUBJECTIVE  Remains on the ventilator, PEEP and FiO2 down to 50/10 Continues on anticoagulation for PE, DVT  Vent Mode: PRVC FiO2 (%):  [50 %] 50 % Set Rate:  [30 bmp] 30 bmp Vt Set:  [520 mL] 520 mL PEEP:  [10 cmH20] 10 cmH20 Plateau Pressure:  [16 cmH20] 16 cmH20  CBC    Component Value Date/Time   WBC 11.1 (H) 09/26/2020 0426   RBC 3.71 (L) 09/26/2020 0426   HGB 10.6 (L) 09/26/2020 0426   HGB 13.6 07/08/2012 2019   HCT 32.8 (L) 09/26/2020 0426   HCT 39.4 07/08/2012 2019   PLT 276 09/26/2020 0426   PLT 202 07/08/2012 2019   MCV 88.4 09/26/2020 0426   MCV 91 07/08/2012 2019   MCH 28.6 09/26/2020 0426   MCHC 32.3 09/26/2020 0426   RDW 13.8 09/26/2020 0426   RDW 12.6 07/08/2012 2019   LYMPHSABS 1.3 09/26/2020 0426   MONOABS 0.7 09/26/2020 0426   EOSABS 0.0 09/26/2020 0426   BASOSABS 0.0 09/26/2020 0426   BMP Latest Ref Rng & Units 09/26/2020 09/25/2020 Oct 20, 2020  Glucose 70 - 99 mg/dL 376(E) 831(D) 176(H)  BUN 6 - 20 mg/dL 60(V) 37(T) 14  Creatinine 0.44 - 1.00 mg/dL 0.62(I) 9.48(N) 4.62  Sodium 135 - 145 mmol/L 138 138 136  Potassium 3.5 - 5.1 mmol/L 3.7 4.7 3.8  Chloride 98 - 111 mmol/L 105 104 99  CO2 22 - 32 mmol/L 21(L) 20(L) 19(L)  Calcium 8.9 - 10.3 mg/dL 7.7(L) 7.8(L) 8.2(L)     BP (!) 85/61   Pulse 87   Temp 97.7 F (36.5 C)   Resp (!) 31   Ht 5\' 4"  (1.626 m)   Wt 64.5 kg   SpO2 97%   BMI 24.41 kg/m    I/O last 3 completed shifts: In: 3459.2 [I.V.:1849.9; NG/GT:259; IV Piggyback:1350.3] Out: 1025 [Urine:1025] No intake/output data recorded.  SpO2: 97 % O2 Flow Rate (L/min): 15 L/min FiO2 (%): 50 %  Estimated  body mass index is 24.41 kg/m as calculated from the following:   Height as of this encounter: 5\' 4"  (1.626 m).   Weight as of this encounter: 64.5 kg.  SIGNIFICANT EVENTS   REVIEW OF SYSTEMS  PATIENT IS UNABLE TO PROVIDE COMPLETE REVIEW OF SYSTEMS DUE TO SEVERE CRITICAL ILLNESS    PHYSICAL EXAMINATION: Gen:      Critically ill-appearing HEENT:  EOMI, sclera anicteric Neck:     No masses; no thyromegaly, ETT Lungs:    Clear to auscultation bilaterally; normal respiratory effort CV:         Regular rate and rhythm; no murmurs Abd:      + bowel sounds; soft, non-tender; no palpable masses, no distension Ext:    No edema; adequate peripheral perfusion Skin:      Warm and dry; no rash Neuro: Sedated, responds  Labs/imaging reviewed Significant for glucose 169, BUN/creatinine 46/2.73. WBC 11.1, hemoglobin 10.6, platelets 276   MEDICATIONS: I have reviewed all medications and confirmed regimen as documented   CULTURE RESULTS   Recent Results (from the past 240 hour(s))  Blood Culture (routine x 2)     Status: None (Preliminary result)  Collection Time: 09/16/2020  7:39 PM   Specimen: BLOOD  Result Value Ref Range Status   Specimen Description BLOOD LEFT ANTECUBITAL  Final   Special Requests   Final    BOTTLES DRAWN AEROBIC AND ANAEROBIC Blood Culture adequate volume   Culture  Setup Time   Final    Organism ID to follow GRAM POSITIVE COCCI AEROBIC BOTTLE ONLY CRITICAL RESULT CALLED TO, READ BACK BY AND VERIFIED WITH: KRISTIN MERRILL AT 1339 09/25/20 SDR Performed at [facility], [address] [place], [place] [address]    Culture GRAM POSITIVE COCCI  Final   Report Status PENDING  Incomplete  Blood Culture ID Panel (Reflexed)     Status: Abnormal   Collection Time: 09/18/2020  7:39 PM  Result Value Ref Range Status   Enterococcus faecalis NOT DETECTED NOT DETECTED Final   Enterococcus Faecium NOT DETECTED NOT DETECTED Final   Listeria monocytogenes NOT  DETECTED NOT DETECTED Final   Staphylococcus species DETECTED (A) NOT DETECTED Final    Comment: CRITICAL RESULT CALLED TO, READ BACK BY AND VERIFIED WITH:  KRISTIN  MERRILL AT 1339 09/25/20 SDR    Staphylococcus aureus (BCID) NOT DETECTED NOT DETECTED Final   Staphylococcus epidermidis NOT DETECTED NOT DETECTED Final   Staphylococcus lugdunensis NOT DETECTED NOT DETECTED Final   Streptococcus species NOT DETECTED NOT DETECTED Final   Streptococcus agalactiae NOT DETECTED NOT DETECTED Final   Streptococcus pneumoniae NOT DETECTED NOT DETECTED Final   Streptococcus pyogenes NOT DETECTED NOT DETECTED Final   A.calcoaceticus-baumannii NOT DETECTED NOT DETECTED Final   Bacteroides fragilis NOT DETECTED NOT DETECTED Final   Enterobacterales NOT DETECTED NOT DETECTED Final   Enterobacter cloacae complex NOT DETECTED NOT DETECTED Final   Escherichia coli NOT DETECTED NOT DETECTED Final   Klebsiella aerogenes NOT DETECTED NOT DETECTED Final   Klebsiella oxytoca NOT DETECTED NOT DETECTED Final   Klebsiella pneumoniae NOT DETECTED NOT DETECTED Final   Proteus species NOT DETECTED NOT DETECTED Final   Salmonella species NOT DETECTED NOT DETECTED Final   Serratia marcescens NOT DETECTED NOT DETECTED Final   Haemophilus influenzae NOT DETECTED NOT DETECTED Final   Neisseria meningitidis NOT DETECTED NOT DETECTED Final   Pseudomonas aeruginosa NOT DETECTED NOT DETECTED Final   Stenotrophomonas maltophilia NOT DETECTED NOT DETECTED Final   Candida albicans NOT DETECTED NOT DETECTED Final   Candida auris NOT DETECTED NOT DETECTED Final   Candida glabrata NOT DETECTED NOT DETECTED Final   Candida krusei NOT DETECTED NOT DETECTED Final   Candida parapsilosis NOT DETECTED NOT DETECTED Final   Candida tropicalis NOT DETECTED NOT DETECTED Final   Cryptococcus neoformans/gattii NOT DETECTED NOT DETECTED Final    Comment: Performed at [facility], [address] [place], [place] [address]   Resp Panel by RT-PCR (Flu A&B, Covid) Nasopharyngeal Swab     Status: Abnormal   Collection Time: 09/19/2020  7:40 PM   Specimen: Nasopharyngeal Swab; Nasopharyngeal(NP) swabs in vial transport medium  Result Value Ref Range Status   SARS Coronavirus 2 by RT PCR POSITIVE (A) NEGATIVE Final    Comment: RESULT CALLED TO, READ BACK BY AND VERIFIED WITH: KENDELL SHEETS @2138  ON 10/05/2020 SKL (NOTE) SARS-CoV-2 target nucleic acids are DETECTED.  The SARS-CoV-2 RNA is generally detectable in upper respiratory specimens during the acute phase of infection. Positive results are indicative of the presence of the identified virus, but do not rule out bacterial infection or co-infection with other pathogens not detected by the test. Clinical correlation  with patient history and other diagnostic information is necessary to determine patient infection status. The expected result is Negative.  Fact Sheet for Patients: BloggerCourse.com  Fact Sheet for Healthcare Providers: SeriousBroker.it  This test is not yet approved or cleared by the [place] FDA and  has been authorized for detection and/or diagnosis of SARS-CoV-2 by FDA under an Emergency Use Authorization (EUA).  This EUA will remain in effect (meaning this test can  be used) for the duration of  the COVID-19 declaration under Section 564(b)(1) of the Act, 21 U.S.C. section 360bbb-3(b)(1), unless the authorization is terminated or revoked sooner.     Influenza A by PCR NEGATIVE NEGATIVE Final   Influenza B by PCR NEGATIVE NEGATIVE Final    Comment: (NOTE) The Xpert Xpress SARS-CoV-2/FLU/RSV plus assay is intended as an aid in the diagnosis of influenza from Nasopharyngeal swab specimens and should not be used as a sole basis for treatment. Nasal washings and aspirates are unacceptable for Xpert Xpress SARS-CoV-2/FLU/RSV testing.  Fact Sheet for  Patients: BloggerCourse.com  Fact Sheet for Healthcare Providers: SeriousBroker.it  This test is not yet approved or cleared by the [place] FDA and has been authorized for detection and/or diagnosis of SARS-CoV-2 by FDA under an Emergency Use Authorization (EUA). This EUA will remain in effect (meaning this test can be used) for the duration of the COVID-19 declaration under Section 564(b)(1) of the Act, 21 U.S.C. section 360bbb-3(b)(1), unless the authorization is terminated or revoked.  Performed at [facility], [address] [place], [place] [address]   Blood Culture (routine x 2)     Status: None (Preliminary result)   Collection Time: September 26, 2020  7:44 PM   Specimen: BLOOD  Result Value Ref Range Status   Specimen Description BLOOD BLOOD LEFT HAND  Final   Special Requests   Final    BOTTLES DRAWN AEROBIC AND ANAEROBIC Blood Culture adequate volume   Culture   Final    NO GROWTH 2 DAYS Performed at [facility], [address] [place], [place] [address]    Report Status PENDING  Incomplete  MRSA PCR Screening     Status: None   Collection Time: 09/25/20  4:07 AM   Specimen: Nasal Mucosa; Nasopharyngeal  Result Value Ref Range Status   MRSA by PCR NEGATIVE NEGATIVE Final    Comment:        The GeneXpert MRSA Assay (FDA approved for NASAL specimens only), is one component of a comprehensive MRSA colonization surveillance program. It is not intended to diagnose MRSA infection nor to guide or monitor treatment for MRSA infections. Performed at [facility], [address] [place], [place] [address]           IMAGING    CT ANGIO CHEST PE W OR WO CONTRAST  Result Date: 09/25/2020 CLINICAL DATA:  Acute hypoxic respiratory failure secondary to COVID-19 EXAM: CT ANGIOGRAPHY CHEST WITH CONTRAST TECHNIQUE: Multidetector CT imaging of the chest was performed using the standard  protocol during bolus administration of intravenous contrast. Multiplanar CT image reconstructions and MIPs were obtained to evaluate the vascular anatomy. CONTRAST:  78mL OMNIPAQUE IOHEXOL 350 MG/ML SOLN COMPARISON:  Portable chest 09/26/2019 FINDINGS: Cardiovascular: Small pulmonary embolism right upper lobe pulmonary arteries in a distal pulmonary artery. Two small branch pulmonary emboli right upper lobe. No central pulmonary embolism. Pulmonary arteries normal in caliber. Pulmonary arteries normal in caliber. Negative thoracic aorta. Heart size within normal limits. Minimal pericardial thickening. Mediastinum/Nodes: Endotracheal tube in  good position. NG tube in the stomach. No mediastinal mass or hematoma. Lungs/Pleura: Extensive diffuse bilateral airspace disease compatible with COVID pneumonia. Bibasilar atelectasis. No significant pleural effusion. Upper Abdomen: No acute abnormality. Musculoskeletal: No acute skeletal abnormality. Review of the MIP images confirms the above findings. IMPRESSION: Small pulmonary embolism right upper lobe. Diffuse bilateral airspace disease compatible with COVID pneumonia. Bibasilar atelectasis. These results will be called to the ordering clinician or representative by the Radiologist Assistant, and communication documented in the PACS or [employer] Dashboard. Electronically Signed   By: Marlan Palauharles  Clark M.D.   On: 09/25/2020 12:47   DG Chest Port 1 View  Result Date: 09/26/2020 CLINICAL DATA:  ETT EXAM: PORTABLE CHEST 1 VIEW COMPARISON:  September 25, 2020 FINDINGS: The ETT is in good position. The OG tube terminates below today's film. No pneumothorax. Bilateral pulmonary infiltrates persist, improved in the interval. The cardiomediastinal silhouette is stable. IMPRESSION: 1. Support apparatus as above. 2. Infiltrate remains in the right base, improved in the interval. Electronically Signed   By: Gerome Samavid  Williams III M.D   On: 09/26/2020 08:29     Nutrition  Status: Nutrition Problem: Inadequate oral intake Etiology: inability to eat Signs/Symptoms: NPO status Interventions: Tube feeding,Prostat,MVI     Indwelling Urinary Catheter continued, requirement due to   Reason to continue Indwelling Urinary Catheter strict Intake/Output monitoring for hemodynamic instability   Central Line/ continued, requirement due to  Reason to continue [employer] Line Monitoring of central venous pressure or other hemodynamic parameters and poor IV access   Ventilator continued, requirement due to severe respiratory failure   Ventilator Sedation RASS 0 to -2      ASSESSMENT AND PLAN SYNOPSIS 59 year old with ARDS, COVID-19 pneumonia, bilateral DVT and small right upper lobe PE  Acute respiratory failure Continue mechanical ventilation with low tidal volume Wean PEEP/FiO2 as tolerated Goal plateau pressure less than 30, goal driving pressure less than 15 Continue remdesivir, steroids Continue baricitinib   NEUROLOGY Acute toxic metabolic encephalopathy, need for sedation Goal RASS -1 Continue fentanyl, propofol  SHOCK-SEPSIS Off pressors   CARDIAC ICU monitoring  ID Received ceftriaxone, azithromycin from 12/16- 12/17 GPC's in blood is likely contaminant.  Will start Vanco while we wait for speciation.  GI GI PROPHYLAXIS as indicated   DIET-->TF's as tolerated Constipation protocol as indicated   ENDO - will use ICU hypoglycemic\Hyperglycemia protocol if indicated Continue Levemir, SSI   DVT/PE Continue heparin drip  RENAL AKI Creatinine increased to 2.73.  Continue to monitor  ELECTROLYTES -follow labs as needed -replace as needed -pharmacy consultation and following   DVT/GI PRX ordered and assessed TRANSFUSIONS AS NEEDED MONITOR FSBS I Assessed the need for Labs I Assessed the need for Foley I Assessed the need for Central Venous Line Family Discussion when available I Assessed the need for Mobilization I made  an Assessment of medications to be adjusted accordingly Safety Risk assessment completed  Updated friend Kennon RoundsSally on 12/18  CASE DISCUSSED IN MULTIDISCIPLINARY ROUNDS WITH ICU TEAM  The patient is critically ill with multiple organ system failure and requires high complexity decision making for assessment and support, frequent evaluation and titration of therapies, advanced monitoring, review of radiographic studies and interpretation of complex data.   Critical Care Time devoted to patient care services, exclusive of separately billable procedures, described in this note is 35 minutes.   Chilton GreathousePraveen Niki Payment MD [facility] Please see [email or web address] for pager details.  09/26/2020, 9:47 AM

## 2020-09-26 NOTE — Consult Note (Signed)
ANTICOAGULATION CONSULT NOTE - Initial Consult  Pharmacy Consult for Heparin Indication: chest pain/ACS  Allergies  Allergen Reactions   Hydrocodone-Acetaminophen Nausea And Vomiting and Itching    Patient Measurements: Height: '5\' 4"'  (162.6 cm) Weight: 64.4 kg (141 lb 15.6 oz) IBW/kg (Calculated) : 54.7 Heparin Dosing Weight: 63.5 kg  Vital Signs: Temp: 98.78 F (37.1 C) (12/18 0000) Temp Source: Rectal (12/17 1600) BP: 84/60 (12/18 0000) Pulse Rate: 94 (12/18 0000)  Labs: Recent Labs    09/20/2020 1942 10/01/2020 2037 10/04/2020 2144 09/25/20 0005 09/25/20 0621 09/25/20 1802 09/26/20 0057  HGB 13.9  --   --   --  13.3  --   --   HCT 42.1  --   --   --  41.6  --   --   PLT 323  --   --   --  270  --   --   APTT  --  27  --   --   --   --   --   LABPROT  --  15.7*  --   --   --   --   --   INR  --  1.3*  --   --   --   --   --   HEPARINUNFRC  --   --   --   --  0.20* 0.26* 0.29*  CREATININE 0.76  --   --   --  1.14*  --   --   TROPONINIHS 260*  --  504* 713* 773*  --   --     Estimated Creatinine Clearance: 45.3 mL/min (A) (by C-G formula based on SCr of 1.14 mg/dL (H)).   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 8 g 0 Unknown at PRN   cyclobenzaprine (FLEXERIL) 5 MG tablet Take 5 mg by mouth 3 (three) times daily as needed for muscle spasms.   Unknown at PRN   Dextromethorphan-guaiFENesin (CORICIDIN HBP CONGESTION/COUGH) 10-200 MG CAPS Take 1 capsule by mouth every 4 (four) hours as needed. 30 capsule 0 Unknown  at PRN   gabapentin (NEURONTIN) 300 MG capsule Take 600-1,200 mg by mouth daily.      hydrochlorothiazide (HYDRODIURIL) 12.5 MG tablet TAKE 1 TABLET BY MOUTH ONCE DAILY. (Patient taking differently: Take 12.5 mg by mouth daily.) 90 tablet 0    oxyCODONE (ROXICODONE) 15 MG immediate release tablet Take 15 mg by mouth every 4 (four) hours as needed (breakthrough pain).   Unknown at PRN   oxyCODONE 30 MG 12 hr tablet Take 30 mg by mouth See admin instructions. Take 1 tablet (20m) by mouth every 8 to 12 hours      Scheduled:   albuterol  2 puff Inhalation Q6H   vitamin C  500 mg Per Tube Daily   baricitinib  2 mg Per Tube Daily   chlorhexidine gluconate (MEDLINE KIT)  15 mL Mouth Rinse BID   Chlorhexidine Gluconate Cloth  6 each Topical  Daily   docusate  100 mg Per Tube BID   feeding supplement (PROSource TF)  90 mL Per Tube TID   feeding supplement (VITAL HIGH PROTEIN)  1,000 mL Per Tube Q24H   fentaNYL (SUBLIMAZE) injection  50 mcg Intravenous Once   folic acid  1 mg Per Tube Daily   heparin  900 Units Intravenous Once   insulin aspart  0-15 Units Subcutaneous Q4H   insulin detemir  5 Units Subcutaneous BID   mouth rinse  15 mL Mouth Rinse 10 times per day   [START ON 09/28/2020] predniSONE  50 mg Per Tube Daily   Followed by   methylPREDNISolone (SOLU-MEDROL) injection  1 mg/kg Intravenous Q12H   multivitamin with minerals  1 tablet Per Tube Daily   pantoprazole (PROTONIX) IV  40 mg Intravenous Q24H   polyethylene glycol  17 g Per Tube Daily   thiamine  100 mg Per Tube Daily   zinc sulfate  220 mg Per Tube Daily   Infusions:   sodium chloride 250 mL (09/25/20 2223)   fentaNYL infusion INTRAVENOUS 100 mcg/hr (09/25/20 1900)   heparin 1,000 Units/hr (09/25/20 2152)   phenylephrine (NEO-SYNEPHRINE) Adult infusion 25 mcg/min (09/26/20 0108)   propofol (DIPRIVAN) infusion 30 mcg/kg/min (09/25/20 2148)   remdesivir 100 mg in NS 100 mL Stopped (09/25/20  0952)   PRN:  Anti-infectives (From admission, onward)   Start     Dose/Rate Route Frequency Ordered Stop   09/25/20 1000  remdesivir 100 mg in sodium chloride 0.9 % 100 mL IVPB       "Followed by" Linked Group Details   100 mg 200 mL/hr over 30 Minutes Intravenous Daily 10/02/2020 1951 09/29/20 0959   10/02/2020 2100  remdesivir 200 mg in sodium chloride 0.9% 250 mL IVPB       "Followed by" Linked Group Details   200 mg 580 mL/hr over 30 Minutes Intravenous Once 10/08/2020 1951 10/05/2020 2327   09/10/2020 2045  cefTRIAXone (ROCEPHIN) 2 g in sodium chloride 0.9 % 100 mL IVPB  Status:  Discontinued        2 g 200 mL/hr over 30 Minutes Intravenous Every 24 hours 09/18/2020 2043 09/25/20 1058   09/18/2020 2045  azithromycin (ZITHROMAX) 500 mg in sodium chloride 0.9 % 250 mL IVPB  Status:  Discontinued        500 mg 250 mL/hr over 60 Minutes Intravenous Every 24 hours 09/23/2020 2043 09/25/20 1058      Assessment: Pharmacy consulted to start heparin for ACS. Trop elevated 260. CBC stable. No DOAC PTA.   Goal of Therapy:  Heparin level 0.3-0.7 units/ml Monitor platelets by anticoagulation protocol: Yes   12/17 given 3800 units bolus x 1, Started heparin infusion at 750 units/hr 12/17 0621 HL = 0.2, bolus 1000 units, increase rate to 900 units/hr  12/17 1802 HL = 0.26,  12/18 0057 HL = 0.29   Plan:   Give 900 unit bolus x 1, Increase infusion to 1100 units/hr  Will recheck HL 6 hrs after rate change  continue to monitor H&H and platelets daily   Destanee Bedonie D 09/26/2020 1:45 AM

## 2020-09-26 NOTE — Progress Notes (Signed)
Pharmacy Antibiotic Note  Margaret Shepard is a 59 y.o. female admitted on 10/06/2020 with bacteremia. Pt presented with worsening shortness of breath, cough, nausea, diarrhea, and poor PO intake. Pt is COVID positive in acute hypoxic respiratory failure. PMH includes seizures, HTN, anxiety, and anemia. Blood culture positive for 1/4 bottles staphylococcus species - most likely contamination - MD wants to treat until speciation is available. Patient has AKI. Pharmacy has been consulted for vancomycin dosing.  Scr 0.76>1.14>2.73 (AKI), afebrile, WBC 11.1  Plan: Vancomycin 1500 mg x 1 loading dose Will plan to monitor by vanc levels since pt has AKI Monitor Scr in AM - time level based on renal function  Height: 5\' 4"  (162.6 cm) Weight: 64.5 kg (142 lb 3.2 oz) IBW/kg (Calculated) : 54.7  Temp (24hrs), Avg:98.4 F (36.9 C), Min:97.52 F (36.4 C), Max:99.5 F (37.5 C)  Recent Labs  Lab 09/22/2020 1942 10/04/2020 1944 09/30/2020 2144 09/25/20 0621 09/26/20 0426  WBC 9.6  --   --  15.6* 11.1*  CREATININE 0.76  --   --  1.14* 2.73*  LATICACIDVEN  --  4.9* 3.2*  --   --     Estimated Creatinine Clearance: 18.9 mL/min (A) (by C-G formula based on SCr of 2.73 mg/dL (H)).    Allergies  Allergen Reactions  . Hydrocodone-Acetaminophen Nausea And Vomiting and Itching   Antimicrobials this admission: 12/18 vancomycin >> 12/16 CTX x 1 12/16 azithromycin x 1  Microbiology results: 12/16 BCx: staphylococcus species 12/17 UCx: Negative 12/17 MRSA PCR: negative  Thank you for allowing pharmacy to be a part of this patient's care.  1/18, PharmD Pharmacy Resident  09/26/2020 11:20 AM

## 2020-09-26 NOTE — Consult Note (Signed)
ANTICOAGULATION CONSULT NOTE   Pharmacy Consult for Heparin Indication: chest pain/ACS and pulmonary embolus  Allergies  Allergen Reactions  . Hydrocodone-Acetaminophen Nausea And Vomiting and Itching    Patient Measurements: Height: '5\' 4"'  (162.6 cm) Weight: 64.5 kg (142 lb 3.2 oz) IBW/kg (Calculated) : 54.7 Heparin Dosing Weight: 63.5 kg  Vital Signs: Temp: 97.7 F (36.5 C) (12/18 1200) Temp Source: (P) Rectal (12/18 0800) BP: 101/66 (12/18 1100) Pulse Rate: 75 (12/18 1200)  Labs: Recent Labs    09/28/2020 1942 09/28/2020 2037 09/21/2020 2144 09/25/20 0005 09/25/20 0621 09/25/20 1802 09/26/20 0057 09/26/20 0426 09/26/20 0814 09/26/20 1500  HGB 13.9  --   --   --  13.3  --   --  10.6*  --   --   HCT 42.1  --   --   --  41.6  --   --  32.8*  --   --   PLT 323  --   --   --  270  --   --  276  --   --   APTT  --  27  --   --   --   --   --   --   --   --   LABPROT  --  15.7*  --   --   --   --   --   --   --   --   INR  --  1.3*  --   --   --   --   --   --   --   --   HEPARINUNFRC  --   --   --   --  0.20*   < > 0.29*  --  0.42 0.40  CREATININE 0.76  --   --   --  1.14*  --   --  2.73*  --   --   TROPONINIHS 260*  --  504* 713* 773*  --   --   --   --   --    < > = values in this interval not displayed.    Estimated Creatinine Clearance: 18.9 mL/min (A) (by C-G formula based on SCr of 2.73 mg/dL (H)).   Medical History: Past Medical History:  Diagnosis Date  . Anemia    noted during hip surgery   . Anxiety    states no longer issue  . Frequent headaches   . Hypertension   . Lumbago   . Seizures ([place])    after brain surgery, no longer has these    Medications:  Medications Prior to Admission  Medication Sig Dispense Refill Last Dose  . albuterol (VENTOLIN HFA) 108 (90 Base) MCG/ACT inhaler Inhale 2 puffs into the lungs every 4 (four) hours as needed for wheezing or shortness of breath. 8 g 0 Unknown at PRN  . cyclobenzaprine (FLEXERIL) 5 MG tablet Take 5  mg by mouth 3 (three) times daily as needed for muscle spasms.   Unknown at PRN  . Dextromethorphan-guaiFENesin (CORICIDIN HBP CONGESTION/COUGH) 10-200 MG CAPS Take 1 capsule by mouth every 4 (four) hours as needed. 30 capsule 0 Unknown at PRN  . gabapentin (NEURONTIN) 300 MG capsule Take 600-1,200 mg by mouth daily.     . hydrochlorothiazide (HYDRODIURIL) 12.5 MG tablet TAKE 1 TABLET BY MOUTH ONCE DAILY. (Patient taking differently: Take 12.5 mg by mouth daily.) 90 tablet 0   . oxyCODONE (ROXICODONE) 15 MG immediate release tablet Take 15 mg by mouth every 4 (four)  hours as needed (breakthrough pain).   Unknown at PRN  . oxyCODONE 30 MG 12 hr tablet Take 30 mg by mouth See admin instructions. Take 1 tablet (39m) by mouth every 8 to 12 hours      Scheduled:  . albuterol  2 puff Inhalation Q6H  . vitamin C  500 mg Per Tube Daily  . [START ON 09/27/2020] baricitinib  1 mg Per Tube Daily  . chlorhexidine gluconate (MEDLINE KIT)  15 mL Mouth Rinse BID  . Chlorhexidine Gluconate Cloth  6 each Topical Daily  . docusate  100 mg Per Tube BID  . feeding supplement (PROSource TF)  90 mL Per Tube TID  . feeding supplement (VITAL HIGH PROTEIN)  1,000 mL Per Tube Q24H  . fentaNYL (SUBLIMAZE) injection  50 mcg Intravenous Once  . folic acid  1 mg Per Tube Daily  . insulin aspart  0-15 Units Subcutaneous Q4H  . insulin detemir  5 Units Subcutaneous BID  . mouth rinse  15 mL Mouth Rinse 10 times per day  . [START ON 09/28/2020] predniSONE  50 mg Per Tube Daily   Followed by  . methylPREDNISolone (SOLU-MEDROL) injection  1 mg/kg Intravenous Q12H  . multivitamin with minerals  1 tablet Per Tube Daily  . pantoprazole (PROTONIX) IV  40 mg Intravenous Q24H  . polyethylene glycol  17 g Per Tube Daily  . thiamine  100 mg Per Tube Daily  . vancomycin variable dose per unstable renal function (pharmacist dosing)   Does not apply See admin instructions  . zinc sulfate  220 mg Per Tube Daily   Infusions:  .  sodium chloride 150 mL/hr at 09/26/20 1200  . fentaNYL infusion INTRAVENOUS 150 mcg/hr (09/26/20 1200)  . heparin 1,100 Units/hr (09/26/20 1200)  . lactated ringers 100 mL/hr at 09/26/20 1200  . phenylephrine (NEO-SYNEPHRINE) Adult infusion Stopped (09/26/20 0430)  . propofol (DIPRIVAN) infusion 40 mcg/kg/min (09/26/20 1200)  . remdesivir 100 mg in NS 100 mL 100 mg (09/26/20 1030)  . vancomycin 1,500 mg (09/26/20 1444)   PRN:  Anti-infectives (From admission, onward)   Start     Dose/Rate Route Frequency Ordered Stop   09/26/20 1115  vancomycin (VANCOREADY) IVPB 1500 mg/300 mL        1,500 mg 150 mL/hr over 120 Minutes Intravenous  Once 09/26/20 1044     09/26/20 1043  vancomycin variable dose per unstable renal function (pharmacist dosing)         Does not apply See admin instructions 09/26/20 1044     09/25/20 1000  remdesivir 100 mg in sodium chloride 0.9 % 100 mL IVPB       "Followed by" Linked Group Details   100 mg 200 mL/hr over 30 Minutes Intravenous Daily 09/18/2020 1951 09/29/20 0959   09/11/2020 2100  remdesivir 200 mg in sodium chloride 0.9% 250 mL IVPB       "Followed by" Linked Group Details   200 mg 580 mL/hr over 30 Minutes Intravenous Once 09/15/2020 1951 09/23/2020 2327   09/25/2020 2045  cefTRIAXone (ROCEPHIN) 2 g in sodium chloride 0.9 % 100 mL IVPB  Status:  Discontinued        2 g 200 mL/hr over 30 Minutes Intravenous Every 24 hours 10/08/2020 2043 09/25/20 1058   09/26/2020 2045  azithromycin (ZITHROMAX) 500 mg in sodium chloride 0.9 % 250 mL IVPB  Status:  Discontinued        500 mg 250 mL/hr over 60 Minutes Intravenous Every 24 hours  10/01/2020 2043 09/25/20 1058      Assessment: 59yo female who presented to ED on 12/16 with worsening SOB, cough, nausea, diarrhea, and poor PO intake. Patient was admitted for acute hypoxic respiratory failure 2/2 PNA due to COVID-19. Pharmacy consulted for heparin dosing and monitoring for ACS and pulmonary embolism. CT chest with small  pulmonary embolism right upper lobe. Trop elevated 260. CBC stable. No DOAC PTA.   Goal of Therapy:  Heparin level 0.3-0.7 units/ml Monitor platelets by anticoagulation protocol: Yes   12/17 0621 HL = 0.2, bolus 1000 units, increased rate to 900 units/hr  12/17 1802 HL = 0.26, bolus 900 units, increased rate to 1000 units/hr 12/18 0057 HL = 0.29, bolus 900 units, increased rate to 1100 units/hr 12/18 0814 HL = 0.42 x1, rate continued at 1100 units/hr 12/18 1500 HL = 0.40 x2   Plan:   Heparin level was therapeutic x2. Will continue heparin 1100 units/hr  Will recheck HL with AM labs and CBC daily while on heparin.   Sherilyn Banker, PharmD Pharmacy Resident  09/26/2020 3:52 PM

## 2020-09-26 NOTE — Consult Note (Addendum)
ANTICOAGULATION CONSULT NOTE   Pharmacy Consult for Heparin Indication: chest pain/ACS and pulmonary embolus  Allergies  Allergen Reactions   Hydrocodone-Acetaminophen Nausea And Vomiting and Itching    Patient Measurements: Height: 5' 4" (162.6 cm) Weight: 64.5 kg (142 lb 3.2 oz) IBW/kg (Calculated) : 54.7 Heparin Dosing Weight: 63.5 kg  Vital Signs: Temp: 97.7 F (36.5 C) (12/18 0600) BP: 85/61 (12/18 0600) Pulse Rate: 87 (12/18 0600)  Labs: Recent Labs    10/01/2020 1942 10/07/2020 1942 10/03/2020 2037 10/09/2020 2144 09/25/20 0005 09/25/20 0621 09/25/20 1802 09/26/20 0057 09/26/20 0426 09/26/20 0814  HGB 13.9  --   --   --   --  13.3  --   --  10.6*  --   HCT 42.1  --   --   --   --  41.6  --   --  32.8*  --   PLT 323  --   --   --   --  270  --   --  276  --   APTT  --   --  27  --   --   --   --   --   --   --   LABPROT  --   --  15.7*  --   --   --   --   --   --   --   INR  --   --  1.3*  --   --   --   --   --   --   --   HEPARINUNFRC  --    < >  --   --   --  0.20* 0.26* 0.29*  --  0.42  CREATININE 0.76  --   --   --   --  1.14*  --   --  2.73*  --   TROPONINIHS 260*  --   --  504* 713* 773*  --   --   --   --    < > = values in this interval not displayed.    Estimated Creatinine Clearance: 18.9 mL/min (A) (by C-G formula based on SCr of 2.73 mg/dL (H)).   Medical History: Past Medical History:  Diagnosis Date   Anemia    noted during hip surgery    Anxiety    states no longer issue   Frequent headaches    Hypertension    Lumbago    Seizures ([place])    after brain surgery, no longer has these    Medications:  Medications Prior to Admission  Medication Sig Dispense Refill Last Dose   albuterol (VENTOLIN HFA) 108 (90 Base) MCG/ACT inhaler Inhale 2 puffs into the lungs every 4 (four) hours as needed for wheezing or shortness of breath. 8 g 0 Unknown at PRN   cyclobenzaprine (FLEXERIL) 5 MG tablet Take 5 mg by mouth 3 (three) times daily as  needed for muscle spasms.   Unknown at PRN   Dextromethorphan-guaiFENesin (CORICIDIN HBP CONGESTION/COUGH) 10-200 MG CAPS Take 1 capsule by mouth every 4 (four) hours as needed. 30 capsule 0 Unknown at PRN   gabapentin (NEURONTIN) 300 MG capsule Take 600-1,200 mg by mouth daily.      hydrochlorothiazide (HYDRODIURIL) 12.5 MG tablet TAKE 1 TABLET BY MOUTH ONCE DAILY. (Patient taking differently: Take 12.5 mg by mouth daily.) 90 tablet 0    oxyCODONE (ROXICODONE) 15 MG immediate release tablet Take 15 mg by mouth every 4 (four) hours as needed (breakthrough pain).  Unknown at PRN  °• oxyCODONE 30 MG 12 hr tablet Take 30 mg by mouth See admin instructions. Take 1 tablet (30mg) by mouth every 8 to 12 hours     ° °Scheduled:  °• albuterol  2 puff Inhalation Q6H  °• vitamin C  500 mg Per Tube Daily  °• baricitinib  2 mg Per Tube Daily  °• chlorhexidine gluconate (MEDLINE KIT)  15 mL Mouth Rinse BID  °• Chlorhexidine Gluconate Cloth  6 each Topical Daily  °• docusate  100 mg Per Tube BID  °• feeding supplement (PROSource TF)  90 mL Per Tube TID  °• feeding supplement (VITAL HIGH PROTEIN)  1,000 mL Per Tube Q24H  °• fentaNYL (SUBLIMAZE) injection  50 mcg Intravenous Once  °• folic acid  1 mg Per Tube Daily  °• insulin aspart  0-15 Units Subcutaneous Q4H  °• insulin detemir  5 Units Subcutaneous BID  °• mouth rinse  15 mL Mouth Rinse 10 times per day  °• [START ON 09/28/2020] predniSONE  50 mg Per Tube Daily  ° Followed by  °• methylPREDNISolone (SOLU-MEDROL) injection  1 mg/kg Intravenous Q12H  °• multivitamin with minerals  1 tablet Per Tube Daily  °• pantoprazole (PROTONIX) IV  40 mg Intravenous Q24H  °• polyethylene glycol  17 g Per Tube Daily  °• thiamine  100 mg Per Tube Daily  °• zinc sulfate  220 mg Per Tube Daily  ° °Infusions:  °• sodium chloride 250 mL (09/25/20 2223)  °• fentaNYL infusion INTRAVENOUS 100 mcg/hr (09/26/20 0600)  °• heparin 1,100 Units/hr (09/26/20 0600)  °• phenylephrine (NEO-SYNEPHRINE)  Adult infusion Stopped (09/26/20 0430)  °• propofol (DIPRIVAN) infusion 30 mcg/kg/min (09/26/20 0600)  °• remdesivir 100 mg in NS 100 mL Stopped (09/25/20 0952)  ° °PRN:  °Anti-infectives (From admission, onward)  ° Start     Dose/Rate Route Frequency Ordered Stop  ° 09/25/20 1000  remdesivir 100 mg in sodium chloride 0.9 % 100 mL IVPB       °"Followed by" Linked Group Details  ° 100 mg °200 mL/hr over 30 Minutes Intravenous Daily 09/18/2020 1951 09/29/20 0959  ° 09/11/2020 2100  remdesivir 200 mg in sodium chloride 0.9% 250 mL IVPB       °"Followed by" Linked Group Details  ° 200 mg °580 mL/hr over 30 Minutes Intravenous Once 09/13/2020 1951 09/23/2020 2327  ° 10/06/2020 2045  cefTRIAXone (ROCEPHIN) 2 g in sodium chloride 0.9 % 100 mL IVPB  Status:  Discontinued       ° 2 g °200 mL/hr over 30 Minutes Intravenous Every 24 hours 09/14/2020 2043 09/25/20 1058  ° 09/23/2020 2045  azithromycin (ZITHROMAX) 500 mg in sodium chloride 0.9 % 250 mL IVPB  Status:  Discontinued       ° 500 mg °250 mL/hr over 60 Minutes Intravenous Every 24 hours 10/07/2020 2043 09/25/20 1058  °  ° ° °Assessment: °Pharmacy consulted to start heparin for ACS. Trop elevated 260. CBC stable. No DOAC PTA.  ° °Goal of Therapy:  °Heparin level 0.3-0.7 units/ml °Monitor platelets by anticoagulation protocol: Yes  ° °12/17 given 3800 units bolus x 1, Started heparin infusion at 750 units/hr °12/17 0621 HL = 0.2, bolus 1000 units, increase rate to 900 units/hr  °12/17 1802 HL = 0.26,  °12/18 0057 HL = 0.29 °12/19 0814 HL = 0.42  °  °Plan:  °· Heparin level was therapeutic. Will continue heparin 1100 units/hr °· Will recheck HL 6 hrs and CBC daily while   on heparin.   Oswald Hillock 09/26/2020 8:41 AM

## 2020-09-26 NOTE — Progress Notes (Signed)
Spoke with Minerva Areola RN re PICC order.  States he has spoken with MD and PICC order will be canceled. States the pt has adequate access at this time for current meds.

## 2020-09-27 ENCOUNTER — Inpatient Hospital Stay: Payer: Medicaid Other

## 2020-09-27 ENCOUNTER — Encounter: Payer: Self-pay | Admitting: Internal Medicine

## 2020-09-27 LAB — BLOOD GAS, ARTERIAL
Acid-base deficit: 5.4 mmol/L — ABNORMAL HIGH (ref 0.0–2.0)
Acid-base deficit: 9.7 mmol/L — ABNORMAL HIGH (ref 0.0–2.0)
Allens test (pass/fail): POSITIVE — AB
Allens test (pass/fail): POSITIVE — AB
Bicarbonate: 20.7 mmol/L (ref 20.0–28.0)
Bicarbonate: 20.2 mmol/L (ref 20.0–28.0)
FIO2: 40
FIO2: 80
MECHVT: 420 mL
MECHVT: 420 mL
O2 Saturation: 85.9 %
O2 Saturation: 99.9 %
PEEP: 12 cmH2O
PEEP: 12 cmH2O
Patient temperature: 37
Patient temperature: 37
Pressure support: 12 cmH2O
RATE: 30 resp/min
RATE: 30 resp/min
pCO2 arterial: 42 mmHg (ref 32.0–48.0)
pCO2 arterial: 62 mmHg — ABNORMAL HIGH (ref 32.0–48.0)
pH, Arterial: 7.3 — ABNORMAL LOW (ref 7.350–7.450)
pH, Arterial: 7.12 — CL (ref 7.350–7.450)
pO2, Arterial: 57 mmHg — ABNORMAL LOW (ref 83.0–108.0)
pO2, Arterial: 363 mmHg — ABNORMAL HIGH (ref 83.0–108.0)

## 2020-09-27 LAB — COMPREHENSIVE METABOLIC PANEL
ALT: 27 U/L (ref 0–44)
AST: 31 U/L (ref 15–41)
Albumin: 2.2 g/dL — ABNORMAL LOW (ref 3.5–5.0)
Alkaline Phosphatase: 84 U/L (ref 38–126)
Anion gap: 11 (ref 5–15)
BUN: 79 mg/dL — ABNORMAL HIGH (ref 6–20)
CO2: 20 mmol/L — ABNORMAL LOW (ref 22–32)
Calcium: 7.8 mg/dL — ABNORMAL LOW (ref 8.9–10.3)
Chloride: 105 mmol/L (ref 98–111)
Creatinine, Ser: 3.26 mg/dL — ABNORMAL HIGH (ref 0.44–1.00)
GFR, Estimated: 16 mL/min — ABNORMAL LOW (ref >60)
Glucose, Bld: 160 mg/dL — ABNORMAL HIGH (ref 70–99)
Potassium: 3.7 mmol/L (ref 3.5–5.1)
Sodium: 136 mmol/L (ref 135–145)
Total Bilirubin: 0.8 mg/dL (ref 0.3–1.2)
Total Protein: 5.6 g/dL — ABNORMAL LOW (ref 6.5–8.1)

## 2020-09-27 LAB — GLUCOSE, CAPILLARY
Glucose-Capillary: 168 mg/dL — ABNORMAL HIGH (ref 70–99)
Glucose-Capillary: 154 mg/dL — ABNORMAL HIGH (ref 70–99)
Glucose-Capillary: 126 mg/dL — ABNORMAL HIGH (ref 70–99)
Glucose-Capillary: 186 mg/dL — ABNORMAL HIGH (ref 70–99)
Glucose-Capillary: 145 mg/dL — ABNORMAL HIGH (ref 70–99)

## 2020-09-27 LAB — CULTURE, BLOOD (ROUTINE X 2): Special Requests: ADEQUATE

## 2020-09-27 LAB — FERRITIN: Ferritin: 877 ng/mL — ABNORMAL HIGH (ref 11–307)

## 2020-09-27 LAB — FIBRIN DERIVATIVES D-DIMER (ARMC ONLY): Fibrin derivatives D-dimer (ARMC): >7500 ng/mL (FEU) — ABNORMAL HIGH (ref 0.00–499.00)

## 2020-09-27 LAB — MAGNESIUM: Magnesium: 2.4 mg/dL (ref 1.7–2.4)

## 2020-09-27 LAB — C-REACTIVE PROTEIN: CRP: 12 mg/dL — ABNORMAL HIGH (ref <1.0)

## 2020-09-27 LAB — HEPARIN LEVEL (UNFRACTIONATED): Heparin Unfractionated: 0.58 IU/mL (ref 0.30–0.70)

## 2020-09-27 LAB — PHOSPHORUS: Phosphorus: 5.1 mg/dL — ABNORMAL HIGH (ref 2.5–4.6)

## 2020-09-27 MED ORDER — VECURONIUM BROMIDE 10 MG IV SOLR
0.1000 mg/kg | INTRAVENOUS | Status: DC | PRN
  Administered 2020-09-28 (×2): 6.7 mg via INTRAVENOUS
  Filled 2020-09-27 (×3): qty 10

## 2020-09-27 MED ORDER — SODIUM CHLORIDE 0.9 % IV SOLN: INTRAVENOUS | Status: DC

## 2020-09-27 NOTE — Progress Notes (Addendum)
CRITICAL CARE NOTE  59 yo female admitted with acute hypoxic respiratory failure secondary to pneumonia due to COVID-19 requiring HHFNC and NRB now intubated and severe resp failure complicated by DVT and PE  12/16 intubated 12/17 high risk for cardiac arrest, on pressors 12/18 Off pressors 12/19 worsening renal function.  Nephrology consult  CC  follow up respiratory failure  SUBJECTIVE FiO2 down to 40% Dyssynchronous with the ventilator Renal function is worse  Vent Mode: PRVC;SIMV;PSV FiO2 (%):  [40 %-50 %] 40 % Set Rate:  [30 bmp] 30 bmp Vt Set:  [520 mL] 520 mL PEEP:  [10 cmH20] 10 cmH20 Pressure Support:  [2 cmH20] 2 cmH20 Plateau Pressure:  [13 cmH20-37 cmH20] 37 cmH20  CBC    Component Value Date/Time   WBC 11.1 (H) 09/26/2020 0426   RBC 3.71 (L) 09/26/2020 0426   HGB 10.6 (L) 09/26/2020 0426   HGB 13.6 07/08/2012 2019   HCT 32.8 (L) 09/26/2020 0426   HCT 39.4 07/08/2012 2019   PLT 276 09/26/2020 0426   PLT 202 07/08/2012 2019   MCV 88.4 09/26/2020 0426   MCV 91 07/08/2012 2019   MCH 28.6 09/26/2020 0426   MCHC 32.3 09/26/2020 0426   RDW 13.8 09/26/2020 0426   RDW 12.6 07/08/2012 2019   LYMPHSABS 1.3 09/26/2020 0426   MONOABS 0.7 09/26/2020 0426   EOSABS 0.0 09/26/2020 0426   BASOSABS 0.0 09/26/2020 0426   BMP Latest Ref Rng & Units 09/27/2020 09/26/2020 09/25/2020  Glucose 70 - 99 mg/dL 409(W160(H) 119(J169(H) 478(G213(H)  BUN 6 - 20 mg/dL 95(A79(H) 21(H46(H) 08(M21(H)  Creatinine 0.44 - 1.00 mg/dL 5.78(I3.26(H) 6.96(E2.73(H) 9.52(W1.14(H)  Sodium 135 - 145 mmol/L 136 138 138  Potassium 3.5 - 5.1 mmol/L 3.7 3.7 4.7  Chloride 98 - 111 mmol/L 105 105 104  CO2 22 - 32 mmol/L 20(L) 21(L) 20(L)  Calcium 8.9 - 10.3 mg/dL 7.8(L) 7.7(L) 7.8(L)     BP 109/77 (BP Location: Right Arm)   Pulse 73   Temp (!) 97.16 F (36.2 C) (Rectal)   Resp 18   Ht 5\' 4"  (1.626 m)   Wt 67 kg   SpO2 96%   BMI 25.35 kg/m    I/O last 3 completed shifts: In: 3672.1 [I.V.:3112.1; NG/GT:560] Out: 1075  [Urine:1075] No intake/output data recorded.  SpO2: 96 % O2 Flow Rate (L/min): 15 L/min FiO2 (%): (S) 40 %  Estimated body mass index is 25.35 kg/m as calculated from the following:   Height as of this encounter: 5\' 4"  (1.626 m).   Weight as of this encounter: 67 kg.  SIGNIFICANT EVENTS   REVIEW OF SYSTEMS  PATIENT IS UNABLE TO PROVIDE COMPLETE REVIEW OF SYSTEMS DUE TO SEVERE CRITICAL ILLNESS    PHYSICAL EXAMINATION: Gen:      No acute distress HEENT:  EOMI, sclera anicteric Neck:     No masses; no thyromegaly, ET tube Lungs:    Clear to auscultation bilaterally; normal respiratory effort CV:         Regular rate and rhythm; no murmurs Abd:      + bowel sounds; soft, non-tender; no palpable masses, no distension Ext:    No edema; adequate peripheral perfusion Skin:      Warm and dry; no rash Neuro: Sedated, unresponsive  Labs/imaging reviewed Significant for worsening creatinine to 3.26 WBC improved 11.1, hemoglobin 10.6, platelets 276 Chest x-ray today with worsening bilateral infiltrate  MEDICATIONS: I have reviewed all medications and confirmed regimen as documented   CULTURE RESULTS  Recent Results (from the past 240 hour(s))  Blood Culture (routine x 2)     Status: Abnormal (Preliminary result)   Collection Time: 10/08/2020  7:39 PM   Specimen: BLOOD  Result Value Ref Range Status   Specimen Description   Final    BLOOD LEFT ANTECUBITAL Performed at [facility], [address], [place], [place] [address]    Special Requests   Final    BOTTLES DRAWN AEROBIC AND ANAEROBIC Blood Culture adequate volume Performed at [facility], [address] [place], [place] [address]    Culture  Setup Time   Final    Organism ID to follow GRAM POSITIVE COCCI AEROBIC BOTTLE ONLY CRITICAL RESULT CALLED TO, READ BACK BY AND VERIFIED WITH: KRISTIN MERRILL AT 1339 09/25/20 SDR Performed at [facility], [address], [place], [place]  [address]    Culture STAPHYLOCOCCUS HOMINIS (A)  Final   Report Status PENDING  Incomplete  Blood Culture ID Panel (Reflexed)     Status: Abnormal   Collection Time: 09/14/2020  7:39 PM  Result Value Ref Range Status   Enterococcus faecalis NOT DETECTED NOT DETECTED Final   Enterococcus Faecium NOT DETECTED NOT DETECTED Final   Listeria monocytogenes NOT DETECTED NOT DETECTED Final   Staphylococcus species DETECTED (A) NOT DETECTED Final    Comment: CRITICAL RESULT CALLED TO, READ BACK BY AND VERIFIED WITH:  KRISTIN  MERRILL AT 1339 09/25/20 SDR    Staphylococcus aureus (BCID) NOT DETECTED NOT DETECTED Final   Staphylococcus epidermidis NOT DETECTED NOT DETECTED Final   Staphylococcus lugdunensis NOT DETECTED NOT DETECTED Final   Streptococcus species NOT DETECTED NOT DETECTED Final   Streptococcus agalactiae NOT DETECTED NOT DETECTED Final   Streptococcus pneumoniae NOT DETECTED NOT DETECTED Final   Streptococcus pyogenes NOT DETECTED NOT DETECTED Final   A.calcoaceticus-baumannii NOT DETECTED NOT DETECTED Final   Bacteroides fragilis NOT DETECTED NOT DETECTED Final   Enterobacterales NOT DETECTED NOT DETECTED Final   Enterobacter cloacae complex NOT DETECTED NOT DETECTED Final   Escherichia coli NOT DETECTED NOT DETECTED Final   Klebsiella aerogenes NOT DETECTED NOT DETECTED Final   Klebsiella oxytoca NOT DETECTED NOT DETECTED Final   Klebsiella pneumoniae NOT DETECTED NOT DETECTED Final   Proteus species NOT DETECTED NOT DETECTED Final   Salmonella species NOT DETECTED NOT DETECTED Final   Serratia marcescens NOT DETECTED NOT DETECTED Final   Haemophilus influenzae NOT DETECTED NOT DETECTED Final   Neisseria meningitidis NOT DETECTED NOT DETECTED Final   Pseudomonas aeruginosa NOT DETECTED NOT DETECTED Final   Stenotrophomonas maltophilia NOT DETECTED NOT DETECTED Final   Candida albicans NOT DETECTED NOT DETECTED Final   Candida auris NOT DETECTED NOT DETECTED Final   Candida  glabrata NOT DETECTED NOT DETECTED Final   Candida krusei NOT DETECTED NOT DETECTED Final   Candida parapsilosis NOT DETECTED NOT DETECTED Final   Candida tropicalis NOT DETECTED NOT DETECTED Final   Cryptococcus neoformans/gattii NOT DETECTED NOT DETECTED Final    Comment: Performed at [facility], [address] [place], [place] [address]  Resp Panel by RT-PCR (Flu A&B, Covid) Nasopharyngeal Swab     Status: Abnormal   Collection Time: 09/20/2020  7:40 PM   Specimen: Nasopharyngeal Swab; Nasopharyngeal(NP) swabs in vial transport medium  Result Value Ref Range Status   SARS Coronavirus 2 by RT PCR POSITIVE (A) NEGATIVE Final    Comment: RESULT CALLED TO, READ BACK BY AND VERIFIED WITH: KENDELL SHEETS @2138  ON 09/29/2020 SK[facility] (NOTE)  SARS-CoV-2 target nucleic acids are DETECTED.  The SARS-CoV-2 RNA is generally detectable in upper respiratory specimens during the acute phase of infection. Positive results are indicative of the presence of the identified virus, but do not rule out bacterial infection or co-infection with other pathogens not detected by the test. Clinical correlation with patient history and other diagnostic information is necessary to determine patient infection status. The expected result is Negative.  Fact Sheet for Patients: BloggerCourse.com  Fact Sheet for Healthcare Providers: SeriousBroker.it  This test is not yet approved or cleared by the [place] FDA and  has been authorized for detection and/or diagnosis of SARS-CoV-2 by FDA under an Emergency Use Authorization (EUA).  This EUA will remain in effect (meaning this test can  be used) for the duration of  the COVID-19 declaration under Section 564(b)(1) of the Act, 21 U.S.C. section 360bbb-3(b)(1), unless the authorization is terminated or revoked sooner.     Influenza A by PCR NEGATIVE NEGATIVE Final   Influenza B by PCR NEGATIVE NEGATIVE  Final    Comment: (NOTE) The Xpert Xpress SARS-CoV-2/FLU/RSV plus assay is intended as an aid in the diagnosis of influenza from Nasopharyngeal swab specimens and should not be used as a sole basis for treatment. Nasal washings and aspirates are unacceptable for Xpert Xpress SARS-CoV-2/FLU/RSV testing.  Fact Sheet for Patients: BloggerCourse.com  Fact Sheet for Healthcare Providers: SeriousBroker.it  This test is not yet approved or cleared by the [place] FDA and has been authorized for detection and/or diagnosis of SARS-CoV-2 by FDA under an Emergency Use Authorization (EUA). This EUA will remain in effect (meaning this test can be used) for the duration of the COVID-19 declaration under Section 564(b)(1) of the Act, 21 U.S.C. section 360bbb-3(b)(1), unless the authorization is terminated or revoked.  Performed at [facility], [address] [place], [place] [address]   Blood Culture (routine x 2)     Status: None (Preliminary result)   Collection Time: 09/22/2020  7:44 PM   Specimen: BLOOD  Result Value Ref Range Status   Specimen Description BLOOD BLOOD LEFT HAND  Final   Special Requests   Final    BOTTLES DRAWN AEROBIC AND ANAEROBIC Blood Culture adequate volume   Culture   Final    NO GROWTH 3 DAYS Performed at [facility], [address] [place], [place] [address]    Report Status PENDING  Incomplete  Urine culture     Status: None   Collection Time: 09/25/20  4:07 AM   Specimen: In/Out Cath Urine  Result Value Ref Range Status   Specimen Description   Final    IN/OUT CATH URINE Performed at [facility], [address], [place], [place] [address]    Special Requests   Final    NONE Performed at [facility], [address], [place], [place] [address]    Culture   Final    NO GROWTH Performed at [facility], [address], [place], [place] [address]     Report Status 09/26/2020 FINAL  Final  MRSA PCR Screening     Status: None   Collection Time: 09/25/20  4:07 AM   Specimen: Nasal Mucosa; Nasopharyngeal  Result Value Ref Range Status   MRSA by PCR NEGATIVE NEGATIVE Final    Comment:        The GeneXpert MRSA Assay (FDA approved for NASAL specimens only), is one component of a comprehensive MRSA colonization surveillance program. It is not  intended to diagnose MRSA infection nor to guide or monitor treatment for MRSA infections. Performed at [facility], [address] [place] [place], [place] [address]           IMAGING    ECHOCARDIOGRAM COMPLETE  Result Date: 09/26/2020    ECHOCARDIOGRAM REPORT   Patient Name:   Margaret Shepard [facility] Date of Exam: 09/26/2020 Medical Rec #:  [id number]           Height:       64.0 in Accession #:    [id number]          Weight:       142.2 lb Date of Birth:  03/20/1961            BSA:          1.692 m Patient Age:    59 years            BP:           85/61 mmHg Patient Gender: F                   HR:           87 bpm. Exam Location:  [facility] Procedure: 2D Echo Indications:     Acute Respiratory Distress  History:         Patient has no prior history of Echocardiogram examinations.                  Risk Factors:Hypertension.  Sonographer:     Hilbert Corrigan Thornton-Maynard Referring Phys:  [id number] Eugenie Norrie Diagnosing Phys: Debbe Odea MD IMPRESSIONS  1. Left ventricular ejection fraction, by estimation, is 55 to 60%. The left ventricle has normal function. The left ventricle has no regional wall motion abnormalities. Left ventricular diastolic parameters are indeterminate.  2. Right ventricular systolic function is low normal. The right ventricular size is mildly enlarged. There is normal pulmonary artery systolic pressure.  3. Right atrial size was moderately dilated.  4. The mitral valve is normal in structure. No evidence of mitral valve regurgitation.  5. The aortic valve is grossly normal.  Aortic valve regurgitation is not visualized. FINDINGS  Left Ventricle: Left ventricular ejection fraction, by estimation, is 55 to 60%. The left ventricle has normal function. The left ventricle has no regional wall motion abnormalities. The left ventricular internal cavity size was normal in size. There is  no left ventricular hypertrophy. Left ventricular diastolic parameters are indeterminate. Right Ventricle: The right ventricular size is mildly enlarged. No increase in right ventricular wall thickness. Right ventricular systolic function is low normal. There is normal pulmonary artery systolic pressure. The tricuspid regurgitant velocity is 2.49 m/s, and with an assumed right atrial pressure of 3 mmHg, the estimated right ventricular systolic pressure is 27.8 mmHg. Left Atrium: Left atrial size was normal in size. Right Atrium: Right atrial size was moderately dilated. Pericardium: There is no evidence of pericardial effusion. Mitral Valve: The mitral valve is normal in structure. No evidence of mitral valve regurgitation. Tricuspid Valve: The tricuspid valve is normal in structure. Tricuspid valve regurgitation is mild. Aortic Valve: The aortic valve is grossly normal. Aortic valve regurgitation is not visualized. Aortic valve peak gradient measures 7.3 mmHg. Pulmonic Valve: The pulmonic valve was not well visualized. Pulmonic valve regurgitation is not visualized. Aorta: The aortic root is normal in size and structure. Venous: IVC assessment for right atrial pressure unable to be performed due to mechanical ventilation. IAS/Shunts: No atrial level shunt  detected by color flow Doppler.  LEFT VENTRICLE PLAX 2D LVIDd:         4.28 cm  Diastology LVIDs:         3.30 cm  LV e' medial:    5.66 cm/s LV PW:         0.98 cm  LV E/e' medial:  10.7 LV IVS:        1.03 cm  LV e' lateral:   7.40 cm/s LVOT diam:     1.80 cm  LV E/e' lateral: 8.2 LV SV:         39 LV SV Index:   23 LVOT Area:     2.54 cm  RIGHT VENTRICLE  RV Basal diam:  4.18 cm RV S prime:     7.29 cm/s TAPSE (M-mode): 1.2 cm LEFT ATRIUM             Index       RIGHT ATRIUM           Index LA diam:        2.70 cm 1.60 cm/m  RA Area:     23.30 cm LA Vol (A2C):   42.7 ml 25.23 ml/m RA Volume:   80.80 ml  47.74 ml/m LA Vol (A4C):   26.8 ml 15.84 ml/m LA Biplane Vol: 33.5 ml 19.79 ml/m  AORTIC VALVE                PULMONIC VALVE AV Area (Vmax): 1.49 cm    PV Vmax:       0.72 m/s AV Vmax:        135.00 cm/s PV Peak grad:  2.1 mmHg AV Peak Grad:   7.3 mmHg LVOT Vmax:      79.00 cm/s LVOT Vmean:     54.600 cm/s LVOT VTI:       0.153 m  AORTA Ao Root diam: 3.00 cm MITRAL VALVE               TRICUSPID VALVE MV Area (PHT): 4.68 cm    TR Peak grad:   24.8 mmHg MV E velocity: 60.40 cm/s  TR Vmax:        249.00 cm/s MV A velocity: 63.40 cm/s MV E/A ratio:  0.95        SHUNTS                            Systemic VTI:  0.15 m                            Systemic Diam: 1.80 cm Debbe Odea MD Electronically signed by Debbe Odea MD Signature Date/Time: 09/26/2020/11:49:11 AM    Final      Nutrition Status: Nutrition Problem: Inadequate oral intake Etiology: inability to eat Signs/Symptoms: NPO status Interventions: Tube feeding,Prostat,MVI     Indwelling Urinary Catheter continued, requirement due to   Reason to continue Indwelling Urinary Catheter strict Intake/Output monitoring for hemodynamic instability   Central Line/ continued, requirement due to  Reason to continue [employer] Monitoring of central venous pressure or other hemodynamic parameters and poor IV access   Ventilator continued, requirement due to severe respiratory failure   Ventilator Sedation RASS 0 to -2      ASSESSMENT AND PLAN SYNOPSIS 59 year old with ARDS, COVID-19 pneumonia, bilateral DVT and small right upper lobe PE  Acute respiratory failure Reduce tidal volume to 8 cc/kg as  she is way above target.  Goal is to get her to 6 cc/kg eventually Use  intermittent paralytics for vent dyssynchrony. Repeat ABG.  Prone if P/F ratio less than 150 Goal plateau pressure less than 30, goal driving pressure less than 15 Continue remdesivir, steroids Continue baricitinib  NEUROLOGY Acute toxic metabolic encephalopathy, need for sedation Continue fentanyl, propofol. Increase RASS goal to -2-3 as she is dyssynchronous with the vent Intermittent paralytics  SHOCK-SEPSIS Off pressors   CARDIAC ICU monitoring  ID Received ceftriaxone, azithromycin from 12/16- 12/17 1/2 blood cultures are growing staph hominis.  This is likely a contaminant DC vancomycin as kidney function is getting worse  GI GI PROPHYLAXIS as indicated   DIET-->TF's as tolerated Constipation protocol as indicated   ENDO - will use ICU hypoglycemic\Hyperglycemia protocol if indicated Continue Levemir, SSI   DVT/PE Continue heparin drip  RENAL AKI Creatinine continues to increase. Nephrology consult.  Likely headed towards dialysis but no acute need to start today  ELECTROLYTES -follow labs as needed -replace as needed -pharmacy consultation and following  GOALS OF CARE Updated friend Margaret Shepard and sister Margaret Shepard on 12/19 over telephone.  No answer when I called brother Duane Discussed worsening clinical status, possible need for dialysis  Decision made to change status to limited code with no CPR or shocks in case of cardiac arrest. We will try dialysis on a time-limited basis if needed but no prolonged mechanical or dialysis support.   DVT/GI PRX ordered and assessed TRANSFUSIONS AS NEEDED MONITOR FSBS I Assessed the need for Labs I Assessed the need for Foley I Assessed the need for Central Venous Line Family Discussion when available I Assessed the need for Mobilization I made an Assessment of medications to be adjusted accordingly Safety Risk assessment completed   CASE DISCUSSED IN MULTIDISCIPLINARY [facility] WITH ICU TEAM  The patient is  critically ill with multiple organ system failure and requires high complexity decision making for assessment and support, frequent evaluation and titration of therapies, advanced monitoring, review of radiographic studies and interpretation of complex data.   Critical Care Time devoted to patient care services, exclusive of separately billable procedures, described in this note is 35 minutes.   Chilton Greathouse MD [facility] Please see [email or web address] for pager details.  09/27/2020, 9:11 AM

## 2020-09-27 NOTE — Progress Notes (Signed)
Assisted tele visit to patient with family member.  Dejae Bernet M, RN  

## 2020-09-27 NOTE — Consult Note (Signed)
Margaret Shepard MRN: [id number] DOB/AGE: 03-28-1961 59 y.o. Primary Care Physician:Cannady, Barbaraann Faster, NP Admit date: 10/09/2020 Chief Complaint:  Chief Complaint  Patient presents with  . Shortness of Breath   HPI:   Patient is a 59 year old Caucasian female with a past medical history of hypertension, anxiety, anemia who was brought to the ER on December 16 with a chief complaint of cough shortness of breath.  History of present illness dates back to 10 days ago when patient was diagnosed with Covid.   Patient clinical condition deteriorated with patient was hypoxic with oxygen saturation at less than 50% on room air.   Patient was later transitioned to high flow nasal cannula and then to nonrebreather mask but patient continued to be hypoxic and tachypneic. Patient was later brought here to the ER.  Upon evaluation in the ER patient was found to have acute respiratory failure with hypoxia/pneumonia due to COVID-19 virus/lactic acidosis and patient was thought to have pulmonary embolism as well Patient thereafter was admitted to ICU/intubated. -Patient was earlier hypotensive requiring pressors -Patient did receive IV contrast to check for pulmonary embolism -Patient was on diuretics as outpatient -Patient did receive NSAIDs -Patient did have diarrhea at the time of admission.  Nephrology is being consulted today Patient was seen in ICU.  Patient is currently intubated unable to offer any specific complaints  Past Medical History:  Diagnosis Date  . Anemia    noted during hip surgery   . Anxiety    states no longer issue  . Frequent headaches   . Hypertension   . Lumbago   . 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 Arthritis Mother   . COPD Mother   . Hyperlipidemia Mother   . Kidney disease Mother   . Alcohol abuse Father   . COPD Sister   . Stroke Sister   . Hypertension Sister   .  Cancer Maternal Grandmother     Social History:  reports that she has quit smoking. She has never used smokeless tobacco. She reports current alcohol use. She reports that she does not use drugs.   Allergies:  Allergies  Allergen Reactions  . Hydrocodone-Acetaminophen Nausea And Vomiting and Itching    Medications Prior to Admission  Medication Sig Dispense Refill  . albuterol (VENTOLIN HFA) 108 (90 Base) MCG/ACT inhaler Inhale 2 puffs into the lungs every 4 (four) hours as needed for wheezing or shortness of breath. 8 g 0  . cyclobenzaprine (FLEXERIL) 5 MG tablet Take 5 mg by mouth 3 (three) times daily as needed for muscle spasms.    . Dextromethorphan-guaiFENesin (CORICIDIN HBP CONGESTION/COUGH) 10-200 MG CAPS Take 1 capsule by mouth every 4 (four) hours as needed. 30 capsule 0  . gabapentin (NEURONTIN) 300 MG capsule Take 600-1,200 mg by mouth daily.    . hydrochlorothiazide (HYDRODIURIL) 12.5 MG tablet TAKE 1 TABLET BY MOUTH ONCE DAILY. (Patient taking differently: Take 12.5 mg by mouth daily.) 90 tablet 0  . oxyCODONE (ROXICODONE) 15 MG immediate release tablet Take 15 mg by mouth every 4 (four) hours as needed (breakthrough pain).    Marland Kitchen oxyCODONE 30 MG 12 hr tablet Take 30 mg by mouth See admin instructions. Take 1 tablet (49m) by mouth every 8 to 12 hours         ROS: Unable to get any data  . albuterol  2 puff  Inhalation Q6H  . vitamin C  500 mg Per Tube Daily  . baricitinib  1 mg Per Tube Daily  . chlorhexidine gluconate (MEDLINE KIT)  15 mL Mouth Rinse BID  . Chlorhexidine Gluconate Cloth  6 each Topical Daily  . docusate  100 mg Per Tube BID  . feeding supplement (PROSource TF)  90 mL Per Tube TID  . feeding supplement (VITAL HIGH PROTEIN)  1,000 mL Per Tube Q24H  . fentaNYL (SUBLIMAZE) injection  50 mcg Intravenous Once  . folic acid  1 mg Per Tube Daily  . insulin aspart  0-15 Units Subcutaneous Q4H  . insulin detemir  5 Units Subcutaneous BID  . mouth rinse  15  mL Mouth Rinse 10 times per day  . [START ON 09/28/2020] predniSONE  50 mg Per Tube Daily   Followed by  . methylPREDNISolone (SOLU-MEDROL) injection  1 mg/kg Intravenous Q12H  . multivitamin with minerals  1 tablet Per Tube Daily  . pantoprazole (PROTONIX) IV  40 mg Intravenous Q24H  . polyethylene glycol  17 g Per Tube Daily  . thiamine  100 mg Per Tube Daily  . zinc sulfate  220 mg Per Tube Daily    Physical Exam: Vital signs in last 24 hours: Temp:  [97.16 F (36.2 C)-98.4 F (36.9 C)] 98.06 F (36.7 C) (12/19 0900) Pulse Rate:  [73-91] 90 (12/19 0900) Resp:  [14-27] 22 (12/19 0900) BP: (96-124)/(64-79) 122/79 (12/19 0900) SpO2:  [91 %-98 %] 93 % (12/19 0900) FiO2 (%):  [40 %-50 %] 40 % (12/19 0851) Weight:  [67 kg] 67 kg (12/19 0221) Weight change: 2.6 kg Last BM Date:  (PTA)  Intake/Output from previous day: 12/18 0701 - 12/19 0700 In: 2975.4 [I.V.:2635.4; NG/GT:340] Out: 825 [Urine:825] No intake/output data recorded.   Physical Exam:  General- pt is intubated, ill-appearing HEENT-ET tube in situ Resp-bilateral breath sounds present, rhonchi present  CVS- S1S2 regular ij rate and rhythm GIT- BS+, soft, NT, ND GU-Foley in situ EXT- NO LE Edema, Cyanosis    Lab Results: CBC Recent Labs    09/25/20 0621 09/26/20 0426  WBC 15.6* 11.1*  HGB 13.3 10.6*  HCT 41.6 32.8*  PLT 270 276    BMET Recent Labs    09/26/20 0426 09/27/20 0511  NA 138 136  K 3.7 3.7  CL 105 105  CO2 21* 20*  GLUCOSE 169* 160*  BUN 46* 79*  CREATININE 2.73* 3.26*  CALCIUM 7.7* 7.8*    Creatinine trend 2021 0.7==>1.14==>2.7==>3.2   MICRO Recent Results (from the past 240 hour(s))  Blood Culture (routine x 2)     Status: Abnormal   Collection Time: 09/10/2020  7:39 PM   Specimen: BLOOD  Result Value Ref Range Status   Specimen Description   Final    BLOOD LEFT ANTECUBITAL Performed at [facility], [address], [place], [place] [address]    Special  Requests   Final    BOTTLES DRAWN AEROBIC AND ANAEROBIC Blood Culture adequate volume Performed at [facility], [address] [place], [place] [address]    Culture  Setup Time   Final    Organism ID to follow [place] TO, READ BACK BY AND VERIFIED WITHErasmo Downer MERRILL AT [id number] 09/25/20 [place] Performed at [facility], [address] [place], [place] [address]    Culture (A)  Final    STAPHYLOCOCCUS HOMINIS THE SIGNIFICANCE OF ISOLATING THIS ORGANISM FROM A SINGLE  SET OF BLOOD CULTURES WHEN MULTIPLE SETS ARE DRAWN IS UNCERTAIN. PLEASE NOTIFY THE MICROBIOLOGY DEPARTMENT WITHIN ONE WEEK IF SPECIATION AND SENSITIVITIES ARE REQUIRED. Performed at [facility], [place] [address], [place], [place] [address]    Report Status 09/27/2020 FINAL  Final  Blood Culture ID Panel (Reflexed)     Status: Abnormal   Collection Time: 10/06/2020  7:39 PM  Result Value Ref Range Status   Enterococcus faecalis NOT DETECTED NOT DETECTED Final   Enterococcus Faecium NOT DETECTED NOT DETECTED Final   Listeria monocytogenes NOT DETECTED NOT DETECTED Final   Staphylococcus species DETECTED (A) NOT DETECTED Final    Comment: CRITICAL RESULT CALLED TO, READ BACK BY AND VERIFIED WITH:  KRISTIN  MERRILL AT [id number] 09/25/20 SDR    Staphylococcus aureus (BCID) NOT DETECTED NOT DETECTED Final   Staphylococcus epidermidis NOT DETECTED NOT DETECTED Final   Staphylococcus lugdunensis NOT DETECTED NOT DETECTED Final   Streptococcus species NOT DETECTED NOT DETECTED Final   Streptococcus agalactiae NOT DETECTED NOT DETECTED Final   Streptococcus pneumoniae NOT DETECTED NOT DETECTED Final   Streptococcus pyogenes NOT DETECTED NOT DETECTED Final   A.calcoaceticus-baumannii NOT DETECTED NOT DETECTED Final   Bacteroides fragilis NOT DETECTED NOT DETECTED Final   Enterobacterales NOT DETECTED NOT DETECTED Final   Enterobacter cloacae complex NOT  DETECTED NOT DETECTED Final   Escherichia coli NOT DETECTED NOT DETECTED Final   Klebsiella aerogenes NOT DETECTED NOT DETECTED Final   Klebsiella oxytoca NOT DETECTED NOT DETECTED Final   Klebsiella pneumoniae NOT DETECTED NOT DETECTED Final   Proteus species NOT DETECTED NOT DETECTED Final   Salmonella species NOT DETECTED NOT DETECTED Final   Serratia marcescens NOT DETECTED NOT DETECTED Final   Haemophilus influenzae NOT DETECTED NOT DETECTED Final   Neisseria meningitidis NOT DETECTED NOT DETECTED Final   Pseudomonas aeruginosa NOT DETECTED NOT DETECTED Final   Stenotrophomonas maltophilia NOT DETECTED NOT DETECTED Final   Candida albicans NOT DETECTED NOT DETECTED Final   Candida auris NOT DETECTED NOT DETECTED Final   Candida glabrata NOT DETECTED NOT DETECTED Final   Candida krusei NOT DETECTED NOT DETECTED Final   Candida parapsilosis NOT DETECTED NOT DETECTED Final   Candida tropicalis NOT DETECTED NOT DETECTED Final   Cryptococcus neoformans/gattii NOT DETECTED NOT DETECTED Final    Comment: Performed at [facility], [address] [place], [place] [address]  Resp Panel by RT-PCR (Flu A&B, Covid) Nasopharyngeal Swab     Status: Abnormal   Collection Time: 10/05/2020  7:40 PM   Specimen: Nasopharyngeal Swab; Nasopharyngeal(NP) swabs in vial transport medium  Result Value Ref Range Status   SARS Coronavirus 2 by RT PCR POSITIVE (A) NEGATIVE Final    Comment: RESULT CALLED TO, READ BACK BY AND VERIFIED WITH: KENDELL SHEETS '@[id number]'  ON 09/28/2020 [facility] (NOTE) SARS-CoV-2 target nucleic acids are DETECTED.  The SARS-CoV-2 RNA is generally detectable in upper respiratory specimens during the acute phase of infection. Positive results are indicative of the presence of the identified virus, but do not rule out bacterial infection or co-infection with other pathogens not detected by the test. Clinical correlation with patient history and other diagnostic information is  necessary to determine patient infection status. The expected result is Negative.  Fact Sheet for Patients: EntrepreneurPulse.com.au  Fact Sheet for Healthcare Providers: IncredibleEmployment.be  This test is not yet approved or cleared by the [place] FDA and  has been authorized for detection and/or diagnosis of SARS-CoV-2 by FDA under an Emergency Use  Authorization (EUA).  This EUA will remain in effect (meaning this test can  be used) for the duration of  the COVID-19 declaration under Section 564(b)(1) of the Act, 21 U.S.C. section 360bbb-3(b)(1), unless the authorization is terminated or revoked sooner.     Influenza A by PCR NEGATIVE NEGATIVE Final   Influenza B by PCR NEGATIVE NEGATIVE Final    Comment: (NOTE) The Xpert Xpress SARS-CoV-2/FLU/RSV plus assay is intended as an aid in the diagnosis of influenza from Nasopharyngeal swab specimens and should not be used as a sole basis for treatment. Nasal washings and aspirates are unacceptable for Xpert Xpress SARS-CoV-2/FLU/RSV testing.  Fact Sheet for Patients: EntrepreneurPulse.com.au  Fact Sheet for Healthcare Providers: IncredibleEmployment.be  This test is not yet approved or cleared by the [place] FDA and has been authorized for detection and/or diagnosis of SARS-CoV-2 by FDA under an Emergency Use Authorization (EUA). This EUA will remain in effect (meaning this test can be used) for the duration of the COVID-19 declaration under Section 564(b)(1) of the Act, 21 U.S.C. section 360bbb-3(b)(1), unless the authorization is terminated or revoked.  Performed at [facility], [address] [place], [place] [address]   Blood Culture (routine x 2)     Status: None (Preliminary result)   Collection Time: 10/04/2020  7:44 PM   Specimen: BLOOD  Result Value Ref Range Status   Specimen Description BLOOD BLOOD LEFT HAND  Final    Special Requests   Final    BOTTLES DRAWN AEROBIC AND ANAEROBIC Blood Culture adequate volume   Culture   Final    NO GROWTH 3 DAYS Performed at [facility], [address], [place], [place] [address]    Report Status PENDING  Incomplete  Urine culture     Status: None   Collection Time: 09/25/20  4:07 AM   Specimen: In/Out Cath Urine  Result Value Ref Range Status   Specimen Description   Final    IN/OUT CATH URINE Performed at [facility], [address] [place], [place] [address]    Special Requests   Final    NONE Performed at [facility], [address] [place], [place] [address]    Culture   Final    NO GROWTH Performed at [facility], [place] [address], [place], [place] [address]    Report Status 09/26/2020 FINAL  Final  MRSA PCR Screening     Status: None   Collection Time: 09/25/20  4:07 AM   Specimen: Nasal Mucosa; Nasopharyngeal  Result Value Ref Range Status   MRSA by PCR NEGATIVE NEGATIVE Final    Comment:        The GeneXpert MRSA Assay (FDA approved for NASAL specimens only), is one component of a comprehensive MRSA colonization surveillance program. It is not intended to diagnose MRSA infection nor to guide or monitor treatment for MRSA infections. Performed at [facility], [address] [place],  [address]       Lab Results  Component Value Date   CALCIUM 7.8 (L) 09/27/2020   PHOS 5.1 (H) 09/27/2020      Impression:   1)Renal  AKI secondary to ATN  Patient has ATN secondary to multiple etiologies -COVID-19 -Hypotension -Sepsis -IV contrast -NSAID use -Hypovolemia -NSAID use  Patient is currently oliguric Patient currently does not have hyperkalemia/severe acidosis/fluid overload I did discuss with patient's family about possible need for renal replacement therapy soon We will start patient on IV fluids We  will continue to follow closely     2) hypotension Patient was  earlier requiring pressors Patient is currently off pressors   3)Anemia of critical illness   HGb at goal (9--11)  4) secondary hyperparathyroidism CKD Mineral-Bone Disorder Secondary hyperparathyroidism-present Phosphorus is in the higher side No need for binders at this time  5) Acute hypoxic respiratory failure Critical team is following Patient is currently intubated   6) Electrolytes   Normokalemic  Normonatremic   7)Acid base  Patient earlier had lactic acidosis and respiratory acidosis Patient had lactic level of 4.9 on December 16 Patient PCO2 was 84 on December 17  Patient pH was 7.09 on December 17 Patient pH is now better at  7.3  Co2 is stable  8) Pulmonary embolism Patient has bilateral DVT Patient has PE Patient is on heparin IV  Plan:   We will ask for urine studies.  We will ask for renal ultrasound.  Will start IVF .  NO Hyperkalemia/Acidosis better /NO fluid overload.  Patient will most likely need CRRT/dialysis/renal placement therapy but not today  I did call pt family to dicuss the kidney related issues Pt Aunt -Ms Alver Sorrow, she saod pt would have wanted HD .    Micha Erck s Theador Hawthorne 09/27/2020, 10:44 AM

## 2020-09-27 NOTE — Progress Notes (Signed)
Vt turned back to 420 per req of NP Cheryll Cockayne)

## 2020-09-28 ENCOUNTER — Inpatient Hospital Stay: Payer: Medicaid Other

## 2020-09-28 ENCOUNTER — Ambulatory Visit: Payer: Medicaid Other | Admitting: Nurse Practitioner

## 2020-09-28 LAB — BLOOD GAS, ARTERIAL
Acid-base deficit: 11.5 mmol/L — ABNORMAL HIGH (ref 0.0–2.0)
Acid-base deficit: 7.5 mmol/L — ABNORMAL HIGH (ref 0.0–2.0)
Allens test (pass/fail): POSITIVE — AB
Allens test (pass/fail): POSITIVE — AB
Bicarbonate: 21.9 mmol/L (ref 20.0–28.0)
Bicarbonate: 20.1 mmol/L (ref 20.0–28.0)
FIO2: 80
FIO2: 60
MECHVT: 420 mL
MECHVT: 520 mL
O2 Saturation: 99.7 %
O2 Saturation: 99.2 %
PEEP: 12 cmH2O
Patient temperature: 37
Patient temperature: 37
RATE: 30 resp/min
RATE: 30 resp/min
pCO2 arterial: 95 mmHg (ref 32.0–48.0)
pCO2 arterial: 49 mmHg — ABNORMAL HIGH (ref 32.0–48.0)
pH, Arterial: 6.97 — CL (ref 7.350–7.450)
pH, Arterial: 7.22 — ABNORMAL LOW (ref 7.350–7.450)
pO2, Arterial: 272 mmHg — ABNORMAL HIGH (ref 83.0–108.0)
pO2, Arterial: 168 mmHg — ABNORMAL HIGH (ref 83.0–108.0)

## 2020-09-28 LAB — HEPARIN LEVEL (UNFRACTIONATED)
Heparin Unfractionated: 1.84 IU/mL — ABNORMAL HIGH (ref 0.30–0.70)
Heparin Unfractionated: 1.88 IU/mL — ABNORMAL HIGH (ref 0.30–0.70)
Heparin Unfractionated: 1.08 IU/mL — ABNORMAL HIGH (ref 0.30–0.70)

## 2020-09-28 LAB — TRIGLYCERIDES: Triglycerides: 312 mg/dL — ABNORMAL HIGH (ref <150)

## 2020-09-28 LAB — GLUCOSE, CAPILLARY
Glucose-Capillary: 154 mg/dL — ABNORMAL HIGH (ref 70–99)
Glucose-Capillary: 169 mg/dL — ABNORMAL HIGH (ref 70–99)
Glucose-Capillary: 152 mg/dL — ABNORMAL HIGH (ref 70–99)
Glucose-Capillary: 184 mg/dL — ABNORMAL HIGH (ref 70–99)
Glucose-Capillary: 156 mg/dL — ABNORMAL HIGH (ref 70–99)
Glucose-Capillary: 145 mg/dL — ABNORMAL HIGH (ref 70–99)
Glucose-Capillary: 190 mg/dL — ABNORMAL HIGH (ref 70–99)

## 2020-09-28 LAB — COMPREHENSIVE METABOLIC PANEL
ALT: 27 U/L (ref 0–44)
AST: 28 U/L (ref 15–41)
Albumin: 2.6 g/dL — ABNORMAL LOW (ref 3.5–5.0)
Alkaline Phosphatase: 98 U/L (ref 38–126)
Anion gap: 12 (ref 5–15)
BUN: 86 mg/dL — ABNORMAL HIGH (ref 6–20)
CO2: 21 mmol/L — ABNORMAL LOW (ref 22–32)
Calcium: 8 mg/dL — ABNORMAL LOW (ref 8.9–10.3)
Chloride: 106 mmol/L (ref 98–111)
Creatinine, Ser: 3.31 mg/dL — ABNORMAL HIGH (ref 0.44–1.00)
GFR, Estimated: 15 mL/min — ABNORMAL LOW (ref >60)
Glucose, Bld: 186 mg/dL — ABNORMAL HIGH (ref 70–99)
Potassium: 4.2 mmol/L (ref 3.5–5.1)
Sodium: 139 mmol/L (ref 135–145)
Total Bilirubin: 1.1 mg/dL (ref 0.3–1.2)
Total Protein: 6.7 g/dL (ref 6.5–8.1)

## 2020-09-28 LAB — CBC
HCT: 37.6 % (ref 36.0–46.0)
Hemoglobin: 11.9 g/dL — ABNORMAL LOW (ref 12.0–15.0)
MCH: 28.5 pg (ref 26.0–34.0)
MCHC: 31.6 g/dL (ref 30.0–36.0)
MCV: 90.2 fL (ref 80.0–100.0)
Platelets: 427 10*3/uL — ABNORMAL HIGH (ref 150–400)
RBC: 4.17 MIL/uL (ref 3.87–5.11)
RDW: 14.9 % (ref 11.5–15.5)
WBC: 18.9 10*3/uL — ABNORMAL HIGH (ref 4.0–10.5)
nRBC: 0.2 % (ref 0.0–0.2)

## 2020-09-28 LAB — FIBRIN DERIVATIVES D-DIMER (ARMC ONLY): Fibrin derivatives D-dimer (ARMC): >7500 ng/mL (FEU) — ABNORMAL HIGH (ref 0.00–499.00)

## 2020-09-28 LAB — HEPATITIS B SURFACE ANTIGEN: Hepatitis B Surface Ag: NONREACTIVE

## 2020-09-28 LAB — C-REACTIVE PROTEIN: CRP: 9.9 mg/dL — ABNORMAL HIGH (ref <1.0)

## 2020-09-28 LAB — FERRITIN: Ferritin: 1199 ng/mL — ABNORMAL HIGH (ref 11–307)

## 2020-09-28 LAB — PHOSPHORUS: Phosphorus: 8.8 mg/dL — ABNORMAL HIGH (ref 2.5–4.6)

## 2020-09-28 LAB — MAGNESIUM: Magnesium: 2.7 mg/dL — ABNORMAL HIGH (ref 1.7–2.4)

## 2020-09-28 MED ORDER — HEPARIN (PORCINE) 25000 UT/250ML-% IV SOLN
1050.0000 [IU]/h | INTRAVENOUS | Status: DC
  Administered 2020-09-28 – 2020-09-29 (×2): 700 [IU]/h via INTRAVENOUS
  Administered 2020-10-01 – 2020-10-07 (×5): 1050 [IU]/h via INTRAVENOUS
  Filled 2020-09-28 (×9): qty 250

## 2020-09-28 MED ORDER — SODIUM BICARBONATE 8.4 % IV SOLN
50.0000 meq | Freq: Once | INTRAVENOUS | Status: AC
  Administered 2020-09-28: 09:00:00 50 meq via INTRAVENOUS
  Filled 2020-09-28: qty 50

## 2020-09-28 MED ORDER — PROSOURCE TF PO LIQD
45.0000 mL | Freq: Two times a day (BID) | ORAL | Status: DC
  Administered 2020-09-28 – 2020-10-02 (×8): 45 mL
  Filled 2020-09-28 (×9): qty 45

## 2020-09-28 MED ORDER — VITAL AF 1.2 CAL PO LIQD
1000.0000 mL | ORAL | Status: DC
  Administered 2020-09-28 – 2020-10-02 (×5): 1000 mL

## 2020-09-28 MED ORDER — HEPARIN (PORCINE) 25000 UT/250ML-% IV SOLN
850.0000 [IU]/h | INTRAVENOUS | Status: DC
  Administered 2020-09-28: 09:00:00 850 [IU]/h via INTRAVENOUS

## 2020-09-28 NOTE — Progress Notes (Signed)
CRITICAL CARE NOTE  59 yo female admitted with acute hypoxic respiratory failure secondary to pneumonia due to COVID-19 requiring HHFNC and NRB now intubated and severe resp failure complicated by DVT and PE  12/16 intubated 12/17 high risk for cardiac arrest, on pressors 12/18 Off pressors 12/19 worsening renal function.  Nephrology consult 09/28/20- patient was noted to be severely acidemic with HAGMA in am s/p 2 amp bicarb with repeat ABG significantly improved  CC  follow up respiratory failure  SUBJECTIVE FiO2 down to 40% Dyssynchronous with the ventilator Renal function is worse  Vent Mode: PRVC FiO2 (%):  [60 %-80 %] 60 % Set Rate:  [30 bmp] 30 bmp Vt Set:  [420 mL-520 mL] 520 mL PEEP:  [12 cmH20] 12 cmH20 Plateau Pressure:  [26 cmH20-28 cmH20] 28 cmH20  CBC    Component Value Date/Time   WBC 18.9 (H) 09/28/2020 0322   RBC 4.17 09/28/2020 0322   HGB 11.9 (L) 09/28/2020 0322   HGB 13.6 07/08/2012 2019   HCT 37.6 09/28/2020 0322   HCT 39.4 07/08/2012 2019   PLT 427 (H) 09/28/2020 0322   PLT 202 07/08/2012 2019   MCV 90.2 09/28/2020 0322   MCV 91 07/08/2012 2019   MCH 28.5 09/28/2020 0322   MCHC 31.6 09/28/2020 0322   RDW 14.9 09/28/2020 0322   RDW 12.6 07/08/2012 2019   LYMPHSABS 1.3 09/26/2020 0426   MONOABS 0.7 09/26/2020 0426   EOSABS 0.0 09/26/2020 0426   BASOSABS 0.0 09/26/2020 0426   BMP Latest Ref Rng & Units 09/28/2020 09/27/2020 09/26/2020  Glucose 70 - 99 mg/dL 161(W186(H) 960(A160(H) 540(J169(H)  BUN 6 - 20 mg/dL 81(X86(H) 91(Y79(H) 78(G46(H)  Creatinine 0.44 - 1.00 mg/dL 9.56(O3.31(H) 1.30(Q3.26(H) 6.57(Q2.73(H)  Sodium 135 - 145 mmol/L 139 136 138  Potassium 3.5 - 5.1 mmol/L 4.2 3.7 3.7  Chloride 98 - 111 mmol/L 106 105 105  CO2 22 - 32 mmol/L 21(L) 20(L) 21(L)  Calcium 8.9 - 10.3 mg/dL 8.0(L) 7.8(L) 7.7(L)     BP 105/63   Pulse 96   Temp (!) 97.52 F (36.4 C)   Resp (!) 26   Ht 5\' 4"  (1.626 m)   Wt 68.9 kg   SpO2 100%   BMI 26.07 kg/m    I/O last 3 completed shifts: In:  5227.2 [I.V.:4087.2; NG/GT:940; IV Piggyback:200] Out: 1600 [Urine:1600] Total I/O In: 502.8 [I.V.:362.8; NG/GT:140] Out: 325 [Urine:325]  SpO2: 100 % O2 Flow Rate (L/min): 15 L/min FiO2 (%): 60 %  Estimated body mass index is 26.07 kg/m as calculated from the following:   Height as of this encounter: 5\' 4"  (1.626 m).   Weight as of this encounter: 68.9 kg.  SIGNIFICANT EVENTS   REVIEW OF SYSTEMS  PATIENT IS UNABLE TO PROVIDE COMPLETE REVIEW OF SYSTEMS DUE TO SEVERE CRITICAL ILLNESS    PHYSICAL EXAMINATION: Gen:      No acute distress HEENT:  EOMI, sclera anicteric Neck:     No masses; no thyromegaly, ET tube Lungs:    Clear to auscultation bilaterally; normal respiratory effort CV:         Regular rate and rhythm; no murmurs Abd:      + bowel sounds; soft, non-tender; no palpable masses, no distension Ext:    No edema; adequate peripheral perfusion Skin:      Warm and dry; no rash Neuro: Sedated, unresponsive    MEDICATIONS: I have reviewed all medications and confirmed regimen as documented   CULTURE RESULTS   Recent Results (from the  past 240 hour(s))  Blood Culture (routine x 2)     Status: Abnormal   Collection Time: 10/04/2020  7:39 PM   Specimen: BLOOD  Result Value Ref Range Status   Specimen Description   Final    BLOOD LEFT ANTECUBITAL Performed at [facility], [address], [place], [place] [address]    Special Requests   Final    BOTTLES DRAWN AEROBIC AND ANAEROBIC Blood Culture adequate volume Performed at [facility], [address] [place], [place] [address]    Culture  Setup Time   Final    Organism ID to follow GRAM POSITIVE COCCI AEROBIC BOTTLE ONLY CRITICAL RESULT CALLED TO, READ BACK BY AND VERIFIED WITH: [facility] MERRILL AT 1339 09/25/20 SDR Performed at [facility], [address] [place], [place] [address]    Culture (A)  Final    STAPHYLOCOCCUS HOMINIS THE SIGNIFICANCE OF ISOLATING THIS ORGANISM  FROM A SINGLE SET OF BLOOD CULTURES WHEN MULTIPLE SETS ARE DRAWN IS UNCERTAIN. PLEASE NOTIFY THE MICROBIOLOGY DEPARTMENT WITHIN ONE WEEK IF SPECIATION AND SENSITIVITIES ARE REQUIRED. Performed at [facility], [address] [place], [place] [address]    Report Status 09/27/2020 FINAL  Final  Blood Culture ID Panel (Reflexed)     Status: Abnormal   Collection Time: 09/23/2020  7:39 PM  Result Value Ref Range Status   Enterococcus faecalis NOT DETECTED NOT DETECTED Final   Enterococcus Faecium NOT DETECTED NOT DETECTED Final   Listeria monocytogenes NOT DETECTED NOT DETECTED Final   Staphylococcus species DETECTED (A) NOT DETECTED Final    Comment: CRITICAL RESULT CALLED TO, READ BACK BY AND VERIFIED WITH:  KRISTIN  MERRILL AT 1339 09/25/20 SDR    Staphylococcus aureus (BCID) NOT DETECTED NOT DETECTED Final   Staphylococcus epidermidis NOT DETECTED NOT DETECTED Final   Staphylococcus lugdunensis NOT DETECTED NOT DETECTED Final   Streptococcus species NOT DETECTED NOT DETECTED Final   Streptococcus agalactiae NOT DETECTED NOT DETECTED Final   Streptococcus pneumoniae NOT DETECTED NOT DETECTED Final   Streptococcus pyogenes NOT DETECTED NOT DETECTED Final   A.calcoaceticus-baumannii NOT DETECTED NOT DETECTED Final   Bacteroides fragilis NOT DETECTED NOT DETECTED Final   Enterobacterales NOT DETECTED NOT DETECTED Final   Enterobacter cloacae complex NOT DETECTED NOT DETECTED Final   Escherichia coli NOT DETECTED NOT DETECTED Final   Klebsiella aerogenes NOT DETECTED NOT DETECTED Final   Klebsiella oxytoca NOT DETECTED NOT DETECTED Final   Klebsiella pneumoniae NOT DETECTED NOT DETECTED Final   Proteus species NOT DETECTED NOT DETECTED Final   Salmonella species NOT DETECTED NOT DETECTED Final   Serratia marcescens NOT DETECTED NOT DETECTED Final   Haemophilus influenzae NOT DETECTED NOT DETECTED Final   Neisseria meningitidis NOT DETECTED NOT DETECTED Final   Pseudomonas aeruginosa  NOT DETECTED NOT DETECTED Final   Stenotrophomonas maltophilia NOT DETECTED NOT DETECTED Final   Candida albicans NOT DETECTED NOT DETECTED Final   Candida auris NOT DETECTED NOT DETECTED Final   Candida glabrata NOT DETECTED NOT DETECTED Final   Candida krusei NOT DETECTED NOT DETECTED Final   Candida parapsilosis NOT DETECTED NOT DETECTED Final   Candida tropicalis NOT DETECTED NOT DETECTED Final   Cryptococcus neoformans/gattii NOT DETECTED NOT DETECTED Final    Comment: Performed at [facility], [address] [place], [place] [address]  Resp Panel by RT-PCR (Flu A&B, Covid) Nasopharyngeal Swab     Status: Abnormal   Collection Time: 09/15/2020  7:40 PM   Specimen:  Nasopharyngeal Swab; Nasopharyngeal(NP) swabs in vial transport medium  Result Value Ref Range Status   SARS Coronavirus 2 by RT PCR POSITIVE (A) NEGATIVE Final    Comment: RESULT CALLED TO, READ BACK BY AND VERIFIED WITH: KENDELL SHEETS @2138  ON 09/11/2020 [facility] (NOTE) SARS-CoV-2 target nucleic acids are DETECTED.  The SARS-CoV-2 RNA is generally detectable in upper respiratory specimens during the acute phase of infection. Positive results are indicative of the presence of the identified virus, but do not rule out bacterial infection or co-infection with other pathogens not detected by the test. Clinical correlation with patient history and other diagnostic information is necessary to determine patient infection status. The expected result is Negative.  Fact Sheet for Patients: 09/26/20  Fact Sheet for Healthcare Providers: BloggerCourse.com  This test is not yet approved or cleared by the [employer] FDA and  has been authorized for detection and/or diagnosis of SARS-CoV-2 by FDA under an Emergency Use Authorization (EUA).  This EUA will remain in effect (meaning this test can  be used) for the duration of  the COVID-19 declaration under Section  564(b)(1) of the Act, 21 U.S.C. section 360bbb-3(b)(1), unless the authorization is terminated or revoked sooner.     Influenza A by PCR NEGATIVE NEGATIVE Final   Influenza B by PCR NEGATIVE NEGATIVE Final    Comment: (NOTE) The Xpert Xpress SARS-CoV-2/FLU/RSV plus assay is intended as an aid in the diagnosis of influenza from Nasopharyngeal swab specimens and should not be used as a sole basis for treatment. Nasal washings and aspirates are unacceptable for Xpert Xpress SARS-CoV-2/FLU/RSV testing.  Fact Sheet for Patients: [place]  Fact Sheet for Healthcare Providers: BloggerCourse.com  This test is not yet approved or cleared by the [employer] FDA and has been authorized for detection and/or diagnosis of SARS-CoV-2 by FDA under an Emergency Use Authorization (EUA). This EUA will remain in effect (meaning this test can be used) for the duration of the COVID-19 declaration under Section 564(b)(1) of the Act, 21 U.S.C. section 360bbb-3(b)(1), unless the authorization is terminated or revoked.  Performed at [facility], [address] [place], [place]   Blood Culture (routine x 2)     Status: None (Preliminary result)   Collection Time: 09/19/2020  7:44 PM   Specimen: BLOOD  Result Value Ref Range Status   Specimen Description BLOOD BLOOD LEFT HAND  Final   Special Requests   Final    BOTTLES DRAWN AEROBIC AND ANAEROBIC Blood Culture adequate volume   Culture   Final    NO GROWTH 3 DAYS Performed at [facility], [address], [place], [place]    Report Status PENDING  Incomplete  Urine culture     Status: None   Collection Time: 09/25/20  4:07 AM   Specimen: In/Out Cath Urine  Result Value Ref Range Status   Specimen Description   Final    IN/OUT CATH URINE Performed at [facility], [address] [place], [place]    Special Requests   Final     NONE Performed at [facility], [address] [place], [place]    Culture   Final    NO GROWTH Performed at [facility], [address] [place], [place]    Report Status 09/26/2020 FINAL  Final  MRSA PCR Screening     Status: None   Collection Time: 09/25/20  4:07 AM   Specimen: Nasal Mucosa; Nasopharyngeal  Result  Value Ref Range Status   MRSA by PCR NEGATIVE NEGATIVE Final    Comment:        The GeneXpert MRSA Assay (FDA approved for NASAL specimens only), is one component of a comprehensive MRSA colonization surveillance program. It is not intended to diagnose MRSA infection nor to guide or monitor treatment for MRSA infections. Performed at [facility], [address], [place], [place] [address]           IMAGING    DG Chest Port 1 View  Result Date: 09/28/2020 CLINICAL DATA:  Respiratory failure. EXAM: PORTABLE CHEST 1 VIEW COMPARISON:  09/27/2020. FINDINGS: Endotracheal tube and NG tube in stable position. Heart size normal. Diffuse bilateral interstitial prominence again noted without interim change. Tiny bilateral pleural effusions can not be excluded. No pneumothorax. IMPRESSION: 1. Lines and tubes in stable position. 2. Diffuse bilateral interstitial prominence again noted without interim change. Tiny bilateral pleural effusions can not be excluded. Electronically Signed   By: Maisie Fus  Register   On: 09/28/2020 06:45   DG Chest Port 1 View  Result Date: 09/27/2020 CLINICAL DATA:  ARDS. EXAM: PORTABLE CHEST 1 VIEW COMPARISON:  September 27, 2020 FINDINGS: The endotracheal tube terminates above the carina. The enteric tube extends below the left hemidiaphragm. The heart size is stable. There are bilateral pleural effusions and bilateral airspace opacities, similar to prior study. There is no pneumothorax. There are old healed left-sided rib fractures. IMPRESSION: 1. Lines and tubes as above. 2. No significant interval  change. Electronically Signed   By: Katherine Mantle M.D.   On: 09/27/2020 21:04     Nutrition Status: Nutrition Problem: Inadequate oral intake Etiology: inability to eat Signs/Symptoms: NPO status Interventions: Tube feeding,Prostat,MVI     Indwelling Urinary Catheter continued, requirement due to   Reason to continue Indwelling Urinary Catheter strict Intake/Output monitoring for hemodynamic instability   Central Line/ continued, requirement due to  Reason to continue [employer] Monitoring of central venous pressure or other hemodynamic parameters and poor IV access   Ventilator continued, requirement due to severe respiratory failure   Ventilator Sedation RASS 0 to -2      ASSESSMENT AND PLAN SYNOPSIS 59 year old with ARDS, COVID-19 pneumonia, bilateral DVT and small right upper lobe PE   Acute respiratory failure Reduce tidal volume to 8 cc/kg as she is way above target.  Goal is to get her to 6 cc/kg eventually Use intermittent paralytics for vent dyssynchrony. Repeat ABG.  Prone if P/F ratio less than 150 Goal plateau pressure less than 30, goal driving pressure less than 15 Continue remdesivir, steroids Continue baricitinib   NEUROLOGY Acute toxic metabolic encephalopathy, need for sedation Continue fentanyl, propofol. Increase RASS goal to -2-3 as she is dyssynchronous with the vent Intermittent paralytics  SHOCK-SEPSIS Off pressors   CARDIAC ICU monitoring  ID Received ceftriaxone, azithromycin from 12/16- 12/17 1/2 blood cultures are growing staph hominis.  This is likely a contaminant DC vancomycin as kidney function is getting worse  GI GI PROPHYLAXIS as indicated   DIET-->TF's as tolerated Constipation protocol as indicated   ENDO - will use ICU hypoglycemic\Hyperglycemia protocol if indicated Continue Levemir, SSI   DVT/PE Continue heparin drip  RENAL AKI Creatinine continues to increase. Nephrology consult.  Likely headed  towards dialysis but no acute need to start today  ELECTROLYTES -follow labs as needed -replace as needed -pharmacy consultation and following  GOALS OF CARE Updated friend Kennon Rounds and sister Alvira Philips on 12/19 over telephone.  No answer  when I called brother Duane Discussed worsening clinical status, possible need for dialysis  Decision made to change status to limited code with no CPR or shocks in case of cardiac arrest. We will try dialysis on a time-limited basis if needed but no prolonged mechanical or dialysis support.   DVT/GI PRX ordered and assessed TRANSFUSIONS AS NEEDED MONITOR FSBS I Assessed the need for Labs I Assessed the need for Foley I Assessed the need for Central Venous Line Family Discussion when available I Assessed the need for Mobilization I made an Assessment of medications to be adjusted accordingly Safety Risk assessment completed   CASE DISCUSSED IN MULTIDISCIPLINARY ROUNDS WITH ICU TEAM  The patient is critically ill with multiple organ system failure and requires high complexity decision making for assessment and support, frequent evaluation and titration of therapies, advanced monitoring, review of radiographic studies and interpretation of complex data.   Critical Care Time devoted to patient care services, exclusive of separately billable procedures, described in this note is 33 minutes.    Vida Rigger, M.D.  Pulmonary & Critical Care Medicine  [facility] \

## 2020-09-28 NOTE — Consult Note (Signed)
ANTICOAGULATION CONSULT NOTE   Pharmacy Consult for Heparin Indication: chest pain/ACS and pulmonary embolus  Allergies  Allergen Reactions   Hydrocodone-Acetaminophen Nausea And Vomiting and Itching    Patient Measurements: Height: '5\' 4"'  (162.6 cm) Weight: 68.9 kg (151 lb 14.4 oz) IBW/kg (Calculated) : 54.7 Heparin Dosing Weight: 63.5 kg  Vital Signs: Temp: 97.16 F (36.2 C) (12/20 1600) BP: 97/59 (12/20 1600) Pulse Rate: 95 (12/20 1600)  Labs: Recent Labs    09/26/20 0426 09/26/20 0814 09/27/20 0511 09/28/20 0322 09/28/20 0622 09/28/20 1622  HGB 10.6*  --   --  11.9*  --   --   HCT 32.8*  --   --  37.6  --   --   PLT 276  --   --  427*  --   --   HEPARINUNFRC  --    < > 0.58 1.84* 1.88* 1.08*  CREATININE 2.73*  --  3.26* 3.31*  --   --    < > = values in this interval not displayed.    Estimated Creatinine Clearance: 17.2 mL/min (A) (by C-G formula based on SCr of 3.31 mg/dL (H)).   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 8 g 0 Unknown at PRN   cyclobenzaprine (FLEXERIL) 5 MG tablet Take 5 mg by mouth 3 (three) times daily as needed for muscle spasms.   Unknown at PRN   Dextromethorphan-guaiFENesin (CORICIDIN HBP CONGESTION/COUGH) 10-200 MG CAPS Take 1 capsule by mouth every 4 (four) hours as needed. 30 capsule 0 Unknown at PRN   gabapentin (NEURONTIN) 300 MG capsule Take 600-1,200 mg by mouth daily.      hydrochlorothiazide (HYDRODIURIL) 12.5 MG tablet TAKE 1 TABLET BY MOUTH ONCE DAILY. (Patient taking  differently: Take 12.5 mg by mouth daily.) 90 tablet 0    oxyCODONE (ROXICODONE) 15 MG immediate release tablet Take 15 mg by mouth every 4 (four) hours as needed (breakthrough pain).   Unknown at PRN   oxyCODONE 30 MG 12 hr tablet Take 30 mg by mouth See admin instructions. Take 1 tablet (15m) by mouth every 8 to 12 hours      Scheduled:   albuterol  2 puff Inhalation Q6H   vitamin C  500 mg Per Tube Daily   baricitinib  1 mg Per Tube Daily   chlorhexidine gluconate (MEDLINE KIT)  15 mL Mouth Rinse BID   Chlorhexidine Gluconate Cloth  6 each Topical Daily   docusate  100 mg Per Tube BID   feeding supplement (PROSource TF)  45 mL Per Tube BID   fentaNYL (SUBLIMAZE) injection  50 mcg Intravenous Once   folic acid  1 mg Per Tube Daily   insulin aspart  0-15 Units Subcutaneous Q4H   insulin detemir  5 Units Subcutaneous BID   mouth rinse  15 mL Mouth Rinse 10 times per day   multivitamin with minerals  1 tablet Per Tube Daily   pantoprazole (PROTONIX) IV  40  mg Intravenous Q24H   polyethylene glycol  17 g Per Tube Daily   predniSONE  50 mg Per Tube Daily   thiamine  100 mg Per Tube Daily   zinc sulfate  220 mg Per Tube Daily   Infusions:   sodium chloride 150 mL/hr at 09/26/20 2000   sodium chloride 75 mL/hr at 09/28/20 0941   feeding supplement (VITAL AF 1.2 CAL) 1,000 mL (09/28/20 1304)   fentaNYL infusion INTRAVENOUS 200 mcg/hr (09/28/20 1543)   heparin 850 Units/hr (09/28/20 0845)   propofol (DIPRIVAN) infusion 70 mcg/kg/min (09/28/20 1456)   PRN:  Anti-infectives (From admission, onward)   Start     Dose/Rate Route Frequency Ordered Stop   09/26/20 1115  vancomycin (VANCOREADY) IVPB 1500 mg/300 mL        1,500 mg 150 mL/hr over 120 Minutes Intravenous  Once 09/26/20 1044 09/26/20 1644   09/26/20 1043  vancomycin variable dose per unstable renal function (pharmacist dosing)  Status:  Discontinued         Does not apply See admin instructions 09/26/20  1044 09/27/20 0854   09/25/20 1000  remdesivir 100 mg in sodium chloride 0.9 % 100 mL IVPB       "Followed by" Linked Group Details   100 mg 200 mL/hr over 30 Minutes Intravenous Daily 09/23/2020 1951 09/28/20 1130   09/29/2020 2100  remdesivir 200 mg in sodium chloride 0.9% 250 mL IVPB       "Followed by" Linked Group Details   200 mg 580 mL/hr over 30 Minutes Intravenous Once 09/13/2020 1951 09/20/2020 2327   10/09/2020 2045  cefTRIAXone (ROCEPHIN) 2 g in sodium chloride 0.9 % 100 mL IVPB  Status:  Discontinued        2 g 200 mL/hr over 30 Minutes Intravenous Every 24 hours 10/06/2020 2043 09/25/20 1058   10/03/2020 2045  azithromycin (ZITHROMAX) 500 mg in sodium chloride 0.9 % 250 mL IVPB  Status:  Discontinued        500 mg 250 mL/hr over 60 Minutes Intravenous Every 24 hours 10/08/2020 2043 09/25/20 1058      Assessment: 59yo female who presented to ED on 12/16 with worsening SOB, cough, nausea, diarrhea, and poor PO intake. Patient was admitted for acute hypoxic respiratory failure 2/2 PNA due to COVID-19. Pharmacy consulted for heparin dosing and monitoring for ACS and pulmonary embolism. CT chest with small pulmonary embolism right upper lobe. Trop elevated 260. CBC stable. No DOAC PTA.   Goal of Therapy:  Heparin level 0.3-0.7 units/ml Monitor platelets by anticoagulation protocol: Yes   12/17 0621 HL = 0.2, bolus 1000 units, increased rate to 900 units/hr  12/17 1802 HL = 0.26, bolus 900 units, increased rate to 1000 units/hr 12/18 0057 HL = 0.29, bolus 900 units, increased rate to 1100 units/hr 12/18 0814 HL = 0.42 x1, rate continued at 1100 units/hr 12/18 1500 HL = 0.40 x2 12/19 0511 HL = 0.58, therapeutic X 3  12/20 0322 HL = 1.84 , supratherapeutic -1100 12/20 0610 HL = 2.0     supratherapeutic-850    Plan:  12/20:  HL @ 1622 = 1.08, supratherapeutic Will hold heparin drip for 1 hr and then restart at reduced rate of  700 units/hr.  Will recheck HL 8 hrs after restart.   Pernell Dupre, PharmD, BCPS Clinical Pharmacist 09/28/2020 5:54 PM

## 2020-09-28 NOTE — Consult Note (Signed)
ANTICOAGULATION CONSULT NOTE   Pharmacy Consult for Heparin Indication: chest pain/ACS and pulmonary embolus  Allergies  Allergen Reactions  . Hydrocodone-Acetaminophen Nausea And Vomiting and Itching    Patient Measurements: Height: _0  (162.6 cm) Weight: 68.9 kg (151 lb 14.4 oz) IBW/kg (Calculated) : 54.7 Heparin Dosing Weight: 63.5 kg  Vital Signs: Temp: 95.9 F (35.5 C) (12/20 0400) Temp Source: Esophageal (12/20 0400) BP: 139/89 (12/20 0400) Pulse Rate: 102 (12/20 0400)  Labs: Recent Labs    09/25/20 0621 09/25/20 1802 09/26/20 0426 09/26/20 0814 09/26/20 1500 09/27/20 0511 09/28/20 0322  HGB 13.3  --  10.6*  --   --   --  11.9*  HCT 41.6  --  32.8*  --   --   --  37.6  PLT 270  --  276  --   --   --  427*  HEPARINUNFRC 0.20*   < >  --    < > 0.40 0.58 1.84*  CREATININE 1.14*  --  2.73*  --   --  3.26* 3.31*  TROPONINIHS 773*  --   --   --   --   --   --    < > = values in this interval not displayed.    Estimated Creatinine Clearance: 17.2 mL/min (A) (by C-G formula based on SCr of 3.31 mg/dL (H)).   Medical History: Past Medical History:  Diagnosis Date  . Anemia    noted during hip surgery   . Anxiety    states no longer issue  . Frequent headaches   . Hypertension   . Lumbago   . 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 albuterol (VENTOLIN HFA) 108 (90 Base) MCG/ACT inhaler Inhale 2 puffs into the lungs every 4 (four) hours as needed for wheezing or shortness of breath. 8 g 0 Unknown at PRN  . cyclobenzaprine (FLEXERIL) 5 MG tablet Take 5 mg by mouth 3 (three) times daily as needed for muscle spasms.   Unknown at PRN  . Dextromethorphan-guaiFENesin (CORICIDIN HBP CONGESTION/COUGH) 10-200 MG CAPS Take 1 capsule by mouth every 4 (four) hours as needed. 30 capsule 0 Unknown at PRN  . gabapentin (NEURONTIN) 300 MG capsule Take 600-1,200  mg by mouth daily.     . hydrochlorothiazide (HYDRODIURIL) 12.5 MG tablet TAKE 1 TABLET BY MOUTH ONCE DAILY. (Patient taking differently: Take 12.5 mg by mouth daily.) 90 tablet 0   . oxyCODONE (ROXICODONE) 15 MG immediate release tablet Take 15 mg by mouth every 4 (four) hours as needed (breakthrough pain).   Unknown at PRN  . oxyCODONE 30 MG 12 hr tablet Take 30 mg by mouth See admin instructions. Take 1 tablet (29m) by mouth every 8 to 12 hours      Scheduled:  . albuterol  2 puff Inhalation Q6H  . vitamin C  500 mg Per Tube Daily  . baricitinib  1 mg Per Tube Daily  . chlorhexidine gluconate (MEDLINE KIT)  15 mL Mouth Rinse BID  . Chlorhexidine Gluconate Cloth  6 each Topical Daily  . docusate  100 mg Per Tube BID  . feeding supplement (PROSource TF)  90 mL Per Tube TID  . feeding supplement (VITAL HIGH PROTEIN)  1,000 mL Per Tube Q24H  . fentaNYL (SUBLIMAZE) injection  50 mcg Intravenous Once  . folic acid  1 mg Per Tube Daily  .  insulin aspart  0-15 Units Subcutaneous Q4H  . insulin detemir  5 Units Subcutaneous BID  . mouth rinse  15 mL Mouth Rinse 10 times per day  . multivitamin with minerals  1 tablet Per Tube Daily  . pantoprazole (PROTONIX) IV  40 mg Intravenous Q24H  . polyethylene glycol  17 g Per Tube Daily  . predniSONE  50 mg Per Tube Daily  . thiamine  100 mg Per Tube Daily  . zinc sulfate  220 mg Per Tube Daily   Infusions:  . sodium chloride 150 mL/hr at 09/26/20 2000  . sodium chloride Stopped (09/28/20 0011)  . fentaNYL infusion INTRAVENOUS 200 mcg/hr (09/28/20 0351)  . heparin 1,100 Units/hr (09/28/20 0200)  . phenylephrine (NEO-SYNEPHRINE) Adult infusion Stopped (09/26/20 0430)  . propofol (DIPRIVAN) infusion 60 mcg/kg/min (09/28/20 0240)  . remdesivir 100 mg in NS 100 mL Stopped (09/27/20 1110)   PRN:  Anti-infectives (From admission, onward)   Start     Dose/Rate Route Frequency Ordered Stop   09/26/20 1115  vancomycin (VANCOREADY) IVPB 1500 mg/300 mL         1,500 mg 150 mL/hr over 120 Minutes Intravenous  Once 09/26/20 1044 09/26/20 1644   09/26/20 1043  vancomycin variable dose per unstable renal function (pharmacist dosing)  Status:  Discontinued         Does not apply See admin instructions 09/26/20 1044 09/27/20 0854   09/25/20 1000  remdesivir 100 mg in sodium chloride 0.9 % 100 mL IVPB       "Followed by" Linked Group Details   100 mg 200 mL/hr over 30 Minutes Intravenous Daily 10/06/2020 1951 09/29/20 0959   10/09/2020 2100  remdesivir 200 mg in sodium chloride 0.9% 250 mL IVPB       "Followed by" Linked Group Details   200 mg 580 mL/hr over 30 Minutes Intravenous Once 10/09/2020 1951 09/09/2020 2327   09/25/2020 2045  cefTRIAXone (ROCEPHIN) 2 g in sodium chloride 0.9 % 100 mL IVPB  Status:  Discontinued        2 g 200 mL/hr over 30 Minutes Intravenous Every 24 hours 09/13/2020 2043 09/25/20 1058   09/12/2020 2045  azithromycin (ZITHROMAX) 500 mg in sodium chloride 0.9 % 250 mL IVPB  Status:  Discontinued        500 mg 250 mL/hr over 60 Minutes Intravenous Every 24 hours 09/19/2020 2043 09/25/20 1058      Assessment: 59yo female who presented to ED on 12/16 with worsening SOB, cough, nausea, diarrhea, and poor PO intake. Patient was admitted for acute hypoxic respiratory failure 2/2 PNA due to COVID-19. Pharmacy consulted for heparin dosing and monitoring for ACS and pulmonary embolism. CT chest with small pulmonary embolism right upper lobe. Trop elevated 260. CBC stable. No DOAC PTA.   Goal of Therapy:  Heparin level 0.3-0.7 units/ml Monitor platelets by anticoagulation protocol: Yes   12/17 0621 HL = 0.2, bolus 1000 units, increased rate to 900 units/hr  12/17 1802 HL = 0.26, bolus 900 units, increased rate to 1000 units/hr 12/18 0057 HL = 0.29, bolus 900 units, increased rate to 1100 units/hr 12/18 0814 HL = 0.42 x1, rate continued at 1100 units/hr 12/18 1500 HL = 0.40 x2 12/19 0511 HL = 0.58, therapeutic X 3  12/20 0322 HL = 1.84 ,  supratherapeutic -   Plan:  12/20:  HL @ 0322 = 1.84.   HL is extremely elevated even though rate is same.   SrCr not increased significantly since 12/19.  May be due to incorrect draw.  Will repeat level to confirm HL is valid.   Ajla Mcgeachy D, PharmD 09/28/2020 6:10 AM

## 2020-09-28 NOTE — Progress Notes (Signed)
[place], [place] 09/28/20  Subjective:   Hospital day # 4   cvs: tachycardic; no pressors Pulm:vent assisted; Fio2 60%; PEEP 12 Gi: TF ongoing Renal: 12/19 0701 - 12/20 0700 In: 2740.4 [I.V.:2000.4; NG/GT:540; IV Piggyback:200] Out: 1200 [Urine:1200] Lab Results  Component Value Date   CREATININE 3.31 (H) 09/28/2020   CREATININE 3.26 (H) 09/27/2020   CREATININE 2.73 (H) 09/26/2020   Critically ill appearing  Objective:  Vital signs in last 24 hours:  Temp:  [95.7 F (35.4 C)-97.5 F (36.4 C)] 96.98 F (36.1 C) (12/20 1300) Pulse Rate:  [77-126] 95 (12/20 1300) Resp:  [15-35] 33 (12/20 1300) BP: (96-163)/(59-94) 100/64 (12/20 1300) SpO2:  [100 %] 100 % (12/20 1300) FiO2 (%):  [60 %-80 %] 60 % (12/20 1105) Weight:  [68.9 kg] 68.9 kg (12/20 0431)  Weight change: 1.9 kg Filed Weights   09/26/20 0356 09/27/20 0221 09/28/20 0431  Weight: 64.5 kg 67 kg 68.9 kg    Intake/Output:    Intake/Output Summary (Last 24 hours) at 09/28/2020 1326 Last data filed at 09/28/2020 1300 Gross per 24 hour  Intake 2640.42 ml  Output 1525 ml  Net 1115.42 ml    Physical Exam: General:  No acute distress, laying in the bed, critically ill  HEENT  anicteric, ETT in place  Pulm/lungs  vent assisted  CVS/Heart  regular rhythm, tachycardic  Abdomen:   Soft,   Extremities:  + peripheral edema  Neurologic:  sedated  Skin:  No acute rashes  Foley in place    Basic Metabolic Panel:  Recent Labs  Lab 10/09/2020 1942 09/25/20 0621 09/26/20 0426 09/27/20 0511 09/28/20 0322  NA 136 138 138 136 139  K 3.8 4.7 3.7 3.7 4.2  CL 99 104 105 105 106  CO2 19* 20* 21* 20* 21*  GLUCOSE 174* 213* 169* 160* 186*  BUN 14 21* 46* 79* 86*  CREATININE 0.76 1.14* 2.73* 3.26* 3.31*  CALCIUM 8.2* 7.8* 7.7* 7.8* 8.0*  MG 1.9 2.1 2.2 2.4 2.7*  PHOS  --  6.4* 3.8 5.1* 8.8*     CBC: Recent Labs  Lab 09/23/2020 1942 09/25/20 0621 09/26/20 0426 09/28/20 0322   WBC 9.6 15.6* 11.1* 18.9*  NEUTROABS 8.4* 13.7* 9.0*  --   HGB 13.9 13.3 10.6* 11.9*  HCT 42.1 41.6 32.8* 37.6  MCV 85.6 90.6 88.4 90.2  PLT 323 270 276 427*      Lab Results  Component Value Date   HEPBSAG NON REACTIVE 09/27/2020      Microbiology:  Recent Results (from the past 240 hour(s))  Blood Culture (routine x 2)     Status: Abnormal   Collection Time: 10/09/2020  7:39 PM   Specimen: BLOOD  Result Value Ref Range Status   Specimen Description   Final    BLOOD LEFT ANTECUBITAL Performed at [facility], [address] [place], [place] [address]    Special Requests   Final    BOTTLES DRAWN AEROBIC AND ANAEROBIC Blood Culture adequate volume Performed at [facility], [address] [place], [place] [address]    Culture  Setup Time   Final    Organism ID to follow [place] CALLED TO, READ BACK BY AND VERIFIED WITH: [facility] MERRILL AT 8101 09/25/20 [place] Performed at [facility], [address] [place], [place] [address]    Culture (A)  Final    STAPHYLOCOCCUS HOMINIS THE SIGNIFICANCE OF ISOLATING THIS ORGANISM FROM  A SINGLE SET OF BLOOD CULTURES WHEN MULTIPLE SETS ARE DRAWN IS UNCERTAIN. PLEASE NOTIFY THE MICROBIOLOGY DEPARTMENT WITHIN ONE WEEK IF SPECIATION AND SENSITIVITIES ARE REQUIRED. Performed at [facility], [place] [address] [place], [place] [address]    Report Status 09/27/2020 FINAL  Final  Blood Culture ID Panel (Reflexed)     Status: Abnormal   Collection Time: 09/28/2020  7:39 PM  Result Value Ref Range Status   Enterococcus faecalis NOT DETECTED NOT DETECTED Final   Enterococcus Faecium NOT DETECTED NOT DETECTED Final   Listeria monocytogenes NOT DETECTED NOT DETECTED Final   Staphylococcus species DETECTED (A) NOT DETECTED Final    Comment: CRITICAL RESULT CALLED TO, READ BACK BY AND VERIFIED WITH:  KRISTIN  MERRILL AT 0630 09/25/20 SDR    Staphylococcus aureus  (BCID) NOT DETECTED NOT DETECTED Final   Staphylococcus epidermidis NOT DETECTED NOT DETECTED Final   Staphylococcus lugdunensis NOT DETECTED NOT DETECTED Final   Streptococcus species NOT DETECTED NOT DETECTED Final   Streptococcus agalactiae NOT DETECTED NOT DETECTED Final   Streptococcus pneumoniae NOT DETECTED NOT DETECTED Final   Streptococcus pyogenes NOT DETECTED NOT DETECTED Final   A.calcoaceticus-baumannii NOT DETECTED NOT DETECTED Final   Bacteroides fragilis NOT DETECTED NOT DETECTED Final   Enterobacterales NOT DETECTED NOT DETECTED Final   Enterobacter cloacae complex NOT DETECTED NOT DETECTED Final   Escherichia coli NOT DETECTED NOT DETECTED Final   Klebsiella aerogenes NOT DETECTED NOT DETECTED Final   Klebsiella oxytoca NOT DETECTED NOT DETECTED Final   Klebsiella pneumoniae NOT DETECTED NOT DETECTED Final   Proteus species NOT DETECTED NOT DETECTED Final   Salmonella species NOT DETECTED NOT DETECTED Final   Serratia marcescens NOT DETECTED NOT DETECTED Final   Haemophilus influenzae NOT DETECTED NOT DETECTED Final   Neisseria meningitidis NOT DETECTED NOT DETECTED Final   Pseudomonas aeruginosa NOT DETECTED NOT DETECTED Final   Stenotrophomonas maltophilia NOT DETECTED NOT DETECTED Final   Candida albicans NOT DETECTED NOT DETECTED Final   Candida auris NOT DETECTED NOT DETECTED Final   Candida glabrata NOT DETECTED NOT DETECTED Final   Candida krusei NOT DETECTED NOT DETECTED Final   Candida parapsilosis NOT DETECTED NOT DETECTED Final   Candida tropicalis NOT DETECTED NOT DETECTED Final   Cryptococcus neoformans/gattii NOT DETECTED NOT DETECTED Final    Comment: Performed at [facility], [address] [place], [place] [address]  Resp Panel by RT-PCR (Flu A&B, Covid) Nasopharyngeal Swab     Status: Abnormal   Collection Time: 09/23/2020  7:40 PM   Specimen: Nasopharyngeal Swab; Nasopharyngeal(NP) swabs in vial transport medium  Result Value Ref Range  Status   SARS Coronavirus 2 by RT PCR POSITIVE (A) NEGATIVE Final    Comment: RESULT CALLED TO, READ BACK BY AND VERIFIED WITH: KENDELL SHEETS _0  ON 09/25/2020 [facility] (NOTE) SARS-CoV-2 target nucleic acids are DETECTED.  The SARS-CoV-2 RNA is generally detectable in upper respiratory specimens during the acute phase of infection. Positive results are indicative of the presence of the identified virus, but do not rule out bacterial infection or co-infection with other pathogens not detected by the test. Clinical correlation with patient history and other diagnostic information is necessary to determine patient infection status. The expected result is Negative.  Fact Sheet for Patients: EntrepreneurPulse.com.au  Fact Sheet for Healthcare Providers: IncredibleEmployment.be  This test is not yet approved or cleared by the [place] FDA and  has been authorized for detection and/or diagnosis of SARS-CoV-2 by FDA under an  Emergency Use Authorization (EUA).  This EUA will remain in effect (meaning this test can  be used) for the duration of  the COVID-19 declaration under Section 564(b)(1) of the Act, 21 U.S.C. section 360bbb-3(b)(1), unless the authorization is terminated or revoked sooner.     Influenza A by PCR NEGATIVE NEGATIVE Final   Influenza B by PCR NEGATIVE NEGATIVE Final    Comment: (NOTE) The Xpert Xpress SARS-CoV-2/FLU/RSV plus assay is intended as an aid in the diagnosis of influenza from Nasopharyngeal swab specimens and should not be used as a sole basis for treatment. Nasal washings and aspirates are unacceptable for Xpert Xpress SARS-CoV-2/FLU/RSV testing.  Fact Sheet for Patients: EntrepreneurPulse.com.au  Fact Sheet for Healthcare Providers: IncredibleEmployment.be  This test is not yet approved or cleared by the [place] FDA and has been authorized for detection and/or diagnosis of  SARS-CoV-2 by FDA under an Emergency Use Authorization (EUA). This EUA will remain in effect (meaning this test can be used) for the duration of the COVID-19 declaration under Section 564(b)(1) of the Act, 21 U.S.C. section 360bbb-3(b)(1), unless the authorization is terminated or revoked.  Performed at [facility], [address] [place], [place] [address]   Blood Culture (routine x 2)     Status: None (Preliminary result)   Collection Time: 09/11/2020  7:44 PM   Specimen: BLOOD  Result Value Ref Range Status   Specimen Description BLOOD BLOOD LEFT HAND  Final   Special Requests   Final    BOTTLES DRAWN AEROBIC AND ANAEROBIC Blood Culture adequate volume   Culture   Final    NO GROWTH 3 DAYS Performed at [facility], [address] [place], [place] [address]    Report Status PENDING  Incomplete  Urine culture     Status: None   Collection Time: 09/25/20  4:07 AM   Specimen: In/Out Cath Urine  Result Value Ref Range Status   Specimen Description   Final    IN/OUT CATH URINE Performed at [facility], [address], [place], [place] [address]    Special Requests   Final    NONE Performed at [facility], [address], [place], [place] [address]    Culture   Final    NO GROWTH Performed at [facility], [place] [address], [place], [place] [address]    Report Status 09/26/2020 FINAL  Final  MRSA PCR Screening     Status: None   Collection Time: 09/25/20  4:07 AM   Specimen: Nasal Mucosa; Nasopharyngeal  Result Value Ref Range Status   MRSA by PCR NEGATIVE NEGATIVE Final    Comment:        The GeneXpert MRSA Assay (FDA approved for NASAL specimens only), is one component of a comprehensive MRSA colonization surveillance program. It is not intended to diagnose MRSA infection nor to guide or monitor treatment for MRSA infections. Performed at [facility], [address] [place], [place] [address]      Coagulation Studies: No results for input(s): LABPROT, INR in the last 72 hours.  Urinalysis: No results for input(s): COLORURINE, LABSPEC, PHURINE, GLUCOSEU, HGBUR, BILIRUBINUR, KETONESUR, PROTEINUR, UROBILINOGEN, NITRITE, LEUKOCYTESUR in the last 72 hours.  Invalid input(s): APPERANCEUR    Imaging: US RENAL  Result Date: 09/27/2020 CLINICAL DATA:  ATN EXAM: RENAL / URINARY TRACT ULTRASOUND COMPLETE COMPARISON:  None. FINDINGS: Right Kidney: Renal measurements: 1.3 x 6.8 x 5.7 cm = volume: 232 mL. Diffusely increased echogenicity. No hydronephrosis. Left  Kidney: Renal measurements: 10.7 x 5.2 x 4.8 cm = volume: 140 mL. Diffusely increased echogenicity. No hydronephrosis. Bladder: Bladder is completely decompressed and is not visualized. Other: None. IMPRESSION: 1. Increased echogenicity of bilateral kidneys as can be seen in medical renal disease. No hydronephrosis. Electronically Signed   By: Valentino Saxon MD   On: 09/27/2020 14:54   DG Chest Port 1 View  Result Date: 09/28/2020 CLINICAL DATA:  Respiratory failure. EXAM: PORTABLE CHEST 1 VIEW COMPARISON:  09/27/2020. FINDINGS: Endotracheal tube and NG tube in stable position. Heart size normal. Diffuse bilateral interstitial prominence again noted without interim change. Tiny bilateral pleural effusions can not be excluded. No pneumothorax. IMPRESSION: 1. Lines and tubes in stable position. 2. Diffuse bilateral interstitial prominence again noted without interim change. Tiny bilateral pleural effusions can not be excluded. Electronically Signed   By: Marcello Moores  Register   On: 09/28/2020 06:45   DG Chest Port 1 View  Result Date: 09/27/2020 CLINICAL DATA:  ARDS. EXAM: PORTABLE CHEST 1 VIEW COMPARISON:  September 27, 2020 FINDINGS: The endotracheal tube terminates above the carina. The enteric tube extends below the left hemidiaphragm. The heart size is stable. There are bilateral pleural effusions and bilateral airspace opacities,  similar to prior study. There is no pneumothorax. There are old healed left-sided rib fractures. IMPRESSION: 1. Lines and tubes as above. 2. No significant interval change. Electronically Signed   By: Constance Holster M.D.   On: 09/27/2020 21:04   DG Chest Port 1 View  Result Date: 09/27/2020 CLINICAL DATA:  Acute respiratory failure.  History of hypertension. EXAM: PORTABLE CHEST 1 VIEW COMPARISON:  September 26, 2020 FINDINGS: The ETT is in good position. The NG tube terminates below today's film. No pneumothorax. Bibasilar infiltrates are mildly more prominent the interval. No other interval changes. IMPRESSION: 1. Support apparatus as above. 2. Bibasilar infiltrates, mildly more prominent in the interval. Electronically Signed   By: Dorise Bullion III M.D   On: 09/27/2020 09:05     Medications:   . sodium chloride 150 mL/hr at 09/26/20 2000  . sodium chloride 75 mL/hr at 09/28/20 0941  . feeding supplement (VITAL AF 1.2 CAL) 1,000 mL (09/28/20 1304)  . fentaNYL infusion INTRAVENOUS 200 mcg/hr (09/28/20 0351)  . heparin 850 Units/hr (09/28/20 0845)  . propofol (DIPRIVAN) infusion 70 mcg/kg/min (09/28/20 1113)   . albuterol  2 puff Inhalation Q6H  . vitamin C  500 mg Per Tube Daily  . baricitinib  1 mg Per Tube Daily  . chlorhexidine gluconate (MEDLINE KIT)  15 mL Mouth Rinse BID  . Chlorhexidine Gluconate Cloth  6 each Topical Daily  . docusate  100 mg Per Tube BID  . feeding supplement (PROSource TF)  45 mL Per Tube BID  . fentaNYL (SUBLIMAZE) injection  50 mcg Intravenous Once  . folic acid  1 mg Per Tube Daily  . insulin aspart  0-15 Units Subcutaneous Q4H  . insulin detemir  5 Units Subcutaneous BID  . mouth rinse  15 mL Mouth Rinse 10 times per day  . multivitamin with minerals  1 tablet Per Tube Daily  . pantoprazole (PROTONIX) IV  40 mg Intravenous Q24H  . polyethylene glycol  17 g Per Tube Daily  . predniSONE  50 mg Per Tube Daily  . thiamine  100 mg Per Tube Daily  .  zinc sulfate  220 mg Per Tube Daily   acetaminophen, fentaNYL, midazolam, midazolam, morphine injection, ondansetron **OR** ondansetron (ZOFRAN) IV, vecuronium  Assessment/ Plan:  59 y.o. female with  hypertension, anxiety, anemia who was brought to the ER on December 16 with a chief complaint of cough shortness of breath   admitted on 09/18/2020 for Edema [R60.9] Lactic acid acidosis [E87.2] Acute respiratory failure with hypoxia ([place]) [J96.01] Pneumonia due to COVID-19 virus [U07.1, J12.82]   # Acute Kidney injury with proteinuria AKI likely secondary to ATN and iv contrast exposure (12/172021) 12/19 0701 - 12/20 0700 In: 2740.4 [I.V.:2000.4; NG/GT:540; IV Piggyback:200] Out: 1200 [Urine:1200]   Lab Results  Component Value Date   CREATININE 3.31 (H) 09/28/2020   CREATININE 3.26 (H) 09/27/2020   CREATININE 2.73 (H) 09/26/2020    S Creatinine trend is worsening but patient is non oliguric Noted to be severely acidotic (resp acidosis) Electrolytes and Volume status are acceptable No acute indication for Dialysis at present  Will continue to monitor closely  # Covid 19 pneumonia Complicated by Acute resp failure with hypoxia Small pulmonary embolism right upper lobe pulmonary arteries in a distal pulmonary artery- requiring iv anticoagulation  # hyperphosphatemia Will check PTH and CK Will consider binders     LOS: 4 Mersadez Linden 12/20/20211:26 PM  [place], [place]  Note: This note was prepared with Dragon dictation. Any transcription errors are unintentional

## 2020-09-28 NOTE — Consult Note (Signed)
ANTICOAGULATION CONSULT NOTE   Pharmacy Consult for Heparin Indication: chest pain/ACS and pulmonary embolus  Allergies  Allergen Reactions  . Hydrocodone-Acetaminophen Nausea And Vomiting and Itching    Patient Measurements: Height: _0  (162.6 cm) Weight: 68.9 kg (151 lb 14.4 oz) IBW/kg (Calculated) : 54.7 Heparin Dosing Weight: 63.5 kg  Vital Signs: Temp: 95.9 F (35.5 C) (12/20 0400) Temp Source: Esophageal (12/20 0400) BP: 139/89 (12/20 0400) Pulse Rate: 102 (12/20 0400)  Labs: Recent Labs    09/26/20 0426 09/26/20 0814 09/26/20 1500 09/27/20 0511 09/28/20 0322  HGB 10.6*  --   --   --  11.9*  HCT 32.8*  --   --   --  37.6  PLT 276  --   --   --  427*  HEPARINUNFRC  --    < > 0.40 0.58 1.84*  CREATININE 2.73*  --   --  3.26* 3.31*   < > = values in this interval not displayed.    Estimated Creatinine Clearance: 17.2 mL/min (A) (by C-G formula based on SCr of 3.31 mg/dL (H)).   Medical History: Past Medical History:  Diagnosis Date  . Anemia    noted during hip surgery   . Anxiety    states no longer issue  . Frequent headaches   . Hypertension   . Lumbago   . 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 albuterol (VENTOLIN HFA) 108 (90 Base) MCG/ACT inhaler Inhale 2 puffs into the lungs every 4 (four) hours as needed for wheezing or shortness of breath. 8 g 0 Unknown at PRN  . cyclobenzaprine (FLEXERIL) 5 MG tablet Take 5 mg by mouth 3 (three) times daily as needed for muscle spasms.   Unknown at PRN  . Dextromethorphan-guaiFENesin (CORICIDIN HBP CONGESTION/COUGH) 10-200 MG CAPS Take 1 capsule by mouth every 4 (four) hours as needed. 30 capsule 0 Unknown at PRN  . gabapentin (NEURONTIN) 300 MG capsule Take 600-1,200 mg by mouth daily.     . hydrochlorothiazide (HYDRODIURIL) 12.5 MG tablet TAKE 1 TABLET BY MOUTH ONCE DAILY. (Patient taking  differently: Take 12.5 mg by mouth daily.) 90 tablet 0   . oxyCODONE (ROXICODONE) 15 MG immediate release tablet Take 15 mg by mouth every 4 (four) hours as needed (breakthrough pain).   Unknown at PRN  . oxyCODONE 30 MG 12 hr tablet Take 30 mg by mouth See admin instructions. Take 1 tablet (53m) by mouth every 8 to 12 hours      Scheduled:  . albuterol  2 puff Inhalation Q6H  . vitamin C  500 mg Per Tube Daily  . baricitinib  1 mg Per Tube Daily  . chlorhexidine gluconate (MEDLINE KIT)  15 mL Mouth Rinse BID  . Chlorhexidine Gluconate Cloth  6 each Topical Daily  . docusate  100 mg Per Tube BID  . feeding supplement (PROSource TF)  90 mL Per Tube TID  . feeding supplement (VITAL HIGH PROTEIN)  1,000 mL Per Tube Q24H  . fentaNYL (SUBLIMAZE) injection  50 mcg Intravenous Once  . folic acid  1 mg Per Tube Daily  . insulin aspart  0-15 Units Subcutaneous Q4H  . insulin detemir  5 Units Subcutaneous BID  . mouth rinse  15 mL Mouth Rinse 10 times per day  . multivitamin with minerals  1 tablet Per Tube Daily  . pantoprazole (PROTONIX)  IV  40 mg Intravenous Q24H  . polyethylene glycol  17 g Per Tube Daily  . predniSONE  50 mg Per Tube Daily  . thiamine  100 mg Per Tube Daily  . zinc sulfate  220 mg Per Tube Daily   Infusions:  . sodium chloride 150 mL/hr at 09/26/20 2000  . sodium chloride Stopped (09/28/20 0011)  . fentaNYL infusion INTRAVENOUS 200 mcg/hr (09/28/20 0351)  . heparin    . phenylephrine (NEO-SYNEPHRINE) Adult infusion Stopped (09/26/20 0430)  . propofol (DIPRIVAN) infusion 60 mcg/kg/min (09/28/20 0240)  . remdesivir 100 mg in NS 100 mL Stopped (09/27/20 1110)   PRN:  Anti-infectives (From admission, onward)   Start     Dose/Rate Route Frequency Ordered Stop   09/26/20 1115  vancomycin (VANCOREADY) IVPB 1500 mg/300 mL        1,500 mg 150 mL/hr over 120 Minutes Intravenous  Once 09/26/20 1044 09/26/20 1644   09/26/20 1043  vancomycin variable dose per unstable renal  function (pharmacist dosing)  Status:  Discontinued         Does not apply See admin instructions 09/26/20 1044 09/27/20 0854   09/25/20 1000  remdesivir 100 mg in sodium chloride 0.9 % 100 mL IVPB       "Followed by" Linked Group Details   100 mg 200 mL/hr over 30 Minutes Intravenous Daily 09/20/2020 1951 09/29/20 0959   09/12/2020 2100  remdesivir 200 mg in sodium chloride 0.9% 250 mL IVPB       "Followed by" Linked Group Details   200 mg 580 mL/hr over 30 Minutes Intravenous Once 09/30/2020 1951 09/29/2020 2327   09/10/2020 2045  cefTRIAXone (ROCEPHIN) 2 g in sodium chloride 0.9 % 100 mL IVPB  Status:  Discontinued        2 g 200 mL/hr over 30 Minutes Intravenous Every 24 hours 09/16/2020 2043 09/25/20 1058   10/09/2020 2045  azithromycin (ZITHROMAX) 500 mg in sodium chloride 0.9 % 250 mL IVPB  Status:  Discontinued        500 mg 250 mL/hr over 60 Minutes Intravenous Every 24 hours 10/08/2020 2043 09/25/20 1058      Assessment: 59yo female who presented to ED on 12/16 with worsening SOB, cough, nausea, diarrhea, and poor PO intake. Patient was admitted for acute hypoxic respiratory failure 2/2 PNA due to COVID-19. Pharmacy consulted for heparin dosing and monitoring for ACS and pulmonary embolism. CT chest with small pulmonary embolism right upper lobe. Trop elevated 260. CBC stable. No DOAC PTA.   Goal of Therapy:  Heparin level 0.3-0.7 units/ml Monitor platelets by anticoagulation protocol: Yes   12/17 0621 HL = 0.2, bolus 1000 units, increased rate to 900 units/hr  12/17 1802 HL = 0.26, bolus 900 units, increased rate to 1000 units/hr 12/18 0057 HL = 0.29, bolus 900 units, increased rate to 1100 units/hr 12/18 0814 HL = 0.42 x1, rate continued at 1100 units/hr 12/18 1500 HL = 0.40 x2 12/19 0511 HL = 0.58, therapeutic X 3  12/20 0322 HL = 1.84 , supratherapeutic - 12/20 0610 HL = 2.0     supratherapeutic   Plan:  12/20:  HL @ 0322 = 1.84.   HL is extremely elevated even though rate is same.    SrCr not increased significantly since 12/19.  May be due to incorrect draw.  Will repeat level to confirm HL is valid.   12/20:  HL @ 0610 = 2.0, supratherapeutic Will hold heparin drip for 1 hr and then restart @  0830 at 850 units/hr.  Will recheck HL 8 hrs after restart.   Merlin Golden D, PharmD 09/28/2020 7:24 AM

## 2020-09-28 NOTE — Progress Notes (Addendum)
Nutrition Follow Up Note   DOCUMENTATION CODES:   Not applicable  INTERVENTION:   Initiate new tube feed regimen of Vital 1.2 @ 38m/hr + Pro-Source 448mBID via tube  Propofol: 26.7 ml/hr- provides 705kcal/day   Free water flushes 3043m4 hours to maintain tube patency   Regimen provides 2081kcal/day, 103g/day protein and 1056m107my free water   Provide MVI tablet once daily per tube as it is more complete than liquid form of MVI.  NUTRITION DIAGNOSIS:   Inadequate oral intake related to inability to eat as evidenced by NPO status.  GOAL:   Patient will meet greater than or equal to 90% of their needs  -met with tube feeds   MONITOR:   Vent status,Labs,Weight trends,TF tolerance,I & O's  ASSESSMENT:   59 y50r old female with PMHx of HTN, seizures, anemia, anxiety admitted with [id number] complicated by DVT and probable acute PE.   Pt remains sedated and ventilated. OGT in place. Pt tolerating current tube feeds; will adjust in setting of increased propofol rate. Per chart, pt up ~ 10lbs since admit but appears to be at her UBW. Nephrology following; pt may need CRRT/HD.   Medications reviewed and include: vitamin C, colace, folic acid, insulin, MVI, protonix, prednisone, thiamine, zinc, NaCl _0 /hr, fentanyl, heparin, propofol    Labs reviewed: K 4.2 wnl, BUN 86(H), creat 3.31(H), P 8.8(H), Mg 2.7(H) Triglycerides 312(H) Wbc- 18.9(H) cbgs- 184, 154, 169, 152, 156 X 24 hrs   Patient is currently intubated on ventilator support MV: 12.1 L/min Temp (24hrs), Avg:96.6 F (35.9 C), Min:95.7 F (35.4 C), Max:97.7 F (36.5 C)  Propofol: 26.7 ml/hr- provides 705kcal/day   MAP- >65mm85mUOP- 1200ml 55met Order:   Diet Order            Diet NPO time specified  Diet effective now                EDUCATION NEEDS:   No education needs have been identified at this time  Skin:  Skin Assessment: Reviewed RN Assessment  Last BM:  12/19- TYPE 6  Height:    Ht Readings from Last 1 Encounters:  10/05/2020 5' 4" (1.626 m)   Weight:   Wt Readings from Last 1 Encounters:  09/28/20 68.9 kg   Ideal Body Weight:  54.5 kg  BMI:  Body mass index is 26.07 kg/m.  Estimated Nutritional Needs:   Kcal:  2037kcal  Protein:  97-110 grams  Fluid:  1.9-2.2 L/day  Ott Zimmerle Koleen DistanceD, LDN Please refer to [facility] and/or RD on-call/weekend/after hours pager

## 2020-09-29 ENCOUNTER — Inpatient Hospital Stay: Payer: Medicaid Other

## 2020-09-29 DIAGNOSIS — N179 Acute kidney failure, unspecified: Secondary | ICD-10-CM

## 2020-09-29 LAB — PHOSPHORUS: Phosphorus: 7.1 mg/dL — ABNORMAL HIGH (ref 2.5–4.6)

## 2020-09-29 LAB — HEPATITIS B SURFACE ANTIBODY, QUANTITATIVE: Hep B S AB Quant (Post): <3.1 m[IU]/mL — ABNORMAL LOW (ref >9.9)

## 2020-09-29 LAB — HEPATITIS B DNA, ULTRAQUANTITATIVE, PCR
HBV DNA SERPL PCR-ACNC: NOT DETECTED IU/mL
HBV DNA SERPL PCR-LOG IU: UNDETERMINED log10 IU/mL

## 2020-09-29 LAB — CBC
HCT: 32.7 % — ABNORMAL LOW (ref 36.0–46.0)
Hemoglobin: 10.7 g/dL — ABNORMAL LOW (ref 12.0–15.0)
MCH: 29.2 pg (ref 26.0–34.0)
MCHC: 32.7 g/dL (ref 30.0–36.0)
MCV: 89.1 fL (ref 80.0–100.0)
Platelets: 331 10*3/uL (ref 150–400)
RBC: 3.67 MIL/uL — ABNORMAL LOW (ref 3.87–5.11)
RDW: 15.4 % (ref 11.5–15.5)
WBC: 10.3 10*3/uL (ref 4.0–10.5)
nRBC: 0.2 % (ref 0.0–0.2)

## 2020-09-29 LAB — CK: Total CK: 40 U/L (ref 38–234)

## 2020-09-29 LAB — COMPREHENSIVE METABOLIC PANEL
ALT: 18 U/L (ref 0–44)
AST: 20 U/L (ref 15–41)
Albumin: 2.3 g/dL — ABNORMAL LOW (ref 3.5–5.0)
Alkaline Phosphatase: 72 U/L (ref 38–126)
Anion gap: 15 (ref 5–15)
BUN: 100 mg/dL — ABNORMAL HIGH (ref 6–20)
CO2: 19 mmol/L — ABNORMAL LOW (ref 22–32)
Calcium: 7.8 mg/dL — ABNORMAL LOW (ref 8.9–10.3)
Chloride: 109 mmol/L (ref 98–111)
Creatinine, Ser: 4.06 mg/dL — ABNORMAL HIGH (ref 0.44–1.00)
GFR, Estimated: 12 mL/min — ABNORMAL LOW (ref >60)
Glucose, Bld: 161 mg/dL — ABNORMAL HIGH (ref 70–99)
Potassium: 3.9 mmol/L (ref 3.5–5.1)
Sodium: 143 mmol/L (ref 135–145)
Total Bilirubin: 0.9 mg/dL (ref 0.3–1.2)
Total Protein: 5.7 g/dL — ABNORMAL LOW (ref 6.5–8.1)

## 2020-09-29 LAB — GLUCOSE, CAPILLARY
Glucose-Capillary: 111 mg/dL — ABNORMAL HIGH (ref 70–99)
Glucose-Capillary: 128 mg/dL — ABNORMAL HIGH (ref 70–99)
Glucose-Capillary: 133 mg/dL — ABNORMAL HIGH (ref 70–99)
Glucose-Capillary: 153 mg/dL — ABNORMAL HIGH (ref 70–99)
Glucose-Capillary: 155 mg/dL — ABNORMAL HIGH (ref 70–99)
Glucose-Capillary: 160 mg/dL — ABNORMAL HIGH (ref 70–99)
Glucose-Capillary: 198 mg/dL — ABNORMAL HIGH (ref 70–99)

## 2020-09-29 LAB — HEPARIN LEVEL (UNFRACTIONATED): Heparin Unfractionated: 0.36 IU/mL (ref 0.30–0.70)

## 2020-09-29 LAB — BLOOD GAS, ARTERIAL
Acid-base deficit: 7.5 mmol/L — ABNORMAL HIGH (ref 0.0–2.0)
Bicarbonate: 18.1 mmol/L — ABNORMAL LOW (ref 20.0–28.0)
FIO2: 50
MECHVT: 520 mL
Mechanical Rate: 30
O2 Saturation: 99.5 %
Patient temperature: 37
pCO2 arterial: 36 mmHg (ref 32.0–48.0)
pH, Arterial: 7.31 — ABNORMAL LOW (ref 7.350–7.450)
pO2, Arterial: 179 mmHg — ABNORMAL HIGH (ref 83.0–108.0)

## 2020-09-29 LAB — CULTURE, BLOOD (ROUTINE X 2)
Culture: NO GROWTH
Special Requests: ADEQUATE

## 2020-09-29 LAB — FERRITIN: Ferritin: 683 ng/mL — ABNORMAL HIGH (ref 11–307)

## 2020-09-29 LAB — FIBRIN DERIVATIVES D-DIMER (ARMC ONLY): Fibrin derivatives D-dimer (ARMC): >7500 ng/mL (FEU) — ABNORMAL HIGH (ref 0.00–499.00)

## 2020-09-29 LAB — MAGNESIUM: Magnesium: 2.8 mg/dL — ABNORMAL HIGH (ref 1.7–2.4)

## 2020-09-29 LAB — C-REACTIVE PROTEIN: CRP: 8.1 mg/dL — ABNORMAL HIGH (ref <1.0)

## 2020-09-29 MED ORDER — HEPARIN SODIUM (PORCINE) 1000 UNIT/ML DIALYSIS
1000.0000 [IU] | INTRAMUSCULAR | Status: DC | PRN
  Filled 2020-09-29: qty 6

## 2020-09-29 NOTE — Progress Notes (Signed)
CRITICAL CARE NOTE  59 yo female admitted with acute hypoxic respiratory failure secondary to pneumonia due to COVID-19 requiring HHFNC and NRB now intubated and severe resp failure complicated by DVT and PE  12/16 intubated 12/17 high risk for cardiac arrest, on pressors 12/18 Off pressors 12/19 worsening renal function.  Nephrology consult 09/28/20- patient was noted to be severely acidemic with HAGMA in am s/p 2 amp bicarb with repeat ABG significantly improved 12/21- patient will require HD, will place vascath today. Meds reviewed and changed, TG elevated on propofol. DCd IVF.  CC  follow up respiratory failure  SUBJECTIVE FiO2 down to 40% Dyssynchronous with the ventilator Renal function is worse  Vent Mode: PRVC FiO2 (%):  [40 %-60 %] 40 % Set Rate:  [30 bmp] 30 bmp Vt Set:  [520 mL] 520 mL PEEP:  [12 cmH20] 12 cmH20 Plateau Pressure:  [25 cmH20-28 cmH20] 25 cmH20  CBC    Component Value Date/Time   WBC 10.3 09/29/2020 0621   RBC 3.67 (L) 09/29/2020 0621   HGB 10.7 (L) 09/29/2020 0621   HGB 13.6 07/08/2012 2019   HCT 32.7 (L) 09/29/2020 0621   HCT 39.4 07/08/2012 2019   PLT 331 09/29/2020 0621   PLT 202 07/08/2012 2019   MCV 89.1 09/29/2020 0621   MCV 91 07/08/2012 2019   MCH 29.2 09/29/2020 0621   MCHC 32.7 09/29/2020 0621   RDW 15.4 09/29/2020 0621   RDW 12.6 07/08/2012 2019   LYMPHSABS 1.3 09/26/2020 0426   MONOABS 0.7 09/26/2020 0426   EOSABS 0.0 09/26/2020 0426   BASOSABS 0.0 09/26/2020 0426   BMP Latest Ref Rng & Units 09/29/2020 09/28/2020 09/27/2020  Glucose 70 - 99 mg/dL 370(W) 888(B) 169(I)  BUN 6 - 20 mg/dL 503(U) 88(K) 80(K)  Creatinine 0.44 - 1.00 mg/dL 3.49(Z) 7.91(T) 0.56(P)  Sodium 135 - 145 mmol/L 143 139 136  Potassium 3.5 - 5.1 mmol/L 3.9 4.2 3.7  Chloride 98 - 111 mmol/L 109 106 105  CO2 22 - 32 mmol/L 19(L) 21(L) 20(L)  Calcium 8.9 - 10.3 mg/dL 7.8(L) 8.0(L) 7.8(L)     BP (!) 143/81   Pulse 95   Temp (!) 96.62 F (35.9 C)    Resp (!) 26   Ht 5\' 4"  (1.626 m)   Wt 73.3 kg   SpO2 100%   BMI 27.74 kg/m    I/O last 3 completed shifts: In: 4163.4 [I.V.:2721.4; NG/GT:1342; IV Piggyback:100] Out: 1170 [Urine:1170] No intake/output data recorded.  SpO2: 100 % O2 Flow Rate (L/min): 15 L/min FiO2 (%): 40 %  Estimated body mass index is 27.74 kg/m as calculated from the following:   Height as of this encounter: 5\' 4"  (1.626 m).   Weight as of this encounter: 73.3 kg.  SIGNIFICANT EVENTS   REVIEW OF SYSTEMS  PATIENT IS UNABLE TO PROVIDE COMPLETE REVIEW OF SYSTEMS DUE TO SEVERE CRITICAL ILLNESS    PHYSICAL EXAMINATION: Gen:      No acute distress HEENT:  EOMI, sclera anicteric Neck:     No masses; no thyromegaly, ET tube Lungs:    Clear to auscultation bilaterally; normal respiratory effort CV:         Regular rate and rhythm; no murmurs Abd:      + bowel sounds; soft, non-tender; no palpable masses, no distension Ext:    No edema; adequate peripheral perfusion Skin:      Warm and dry; no rash Neuro: Sedated, unresponsive    MEDICATIONS: I have reviewed all medications and  confirmed regimen as documented   CULTURE RESULTS   Recent Results (from the past 240 hour(s))  Blood Culture (routine x 2)     Status: Abnormal   Collection Time: 09/14/2020  7:39 PM   Specimen: BLOOD  Result Value Ref Range Status   Specimen Description   Final    BLOOD LEFT ANTECUBITAL Performed at [facility], [address] [place], [place] [address]    Special Requests   Final    BOTTLES DRAWN AEROBIC AND ANAEROBIC Blood Culture adequate volume Performed at [facility], [address] [place], [place] [address]    Culture  Setup Time   Final    Organism ID to follow GRAM POSITIVE COCCI AEROBIC BOTTLE ONLY CRITICAL RESULT CALLED TO, READ BACK BY AND VERIFIED WITH: [facility] MERRILL AT 1339 09/25/20 SDR Performed at [facility], [address] [place], [place] [address]     Culture (A)  Final    STAPHYLOCOCCUS HOMINIS THE SIGNIFICANCE OF ISOLATING THIS ORGANISM FROM A SINGLE SET OF BLOOD CULTURES WHEN MULTIPLE SETS ARE DRAWN IS UNCERTAIN. PLEASE NOTIFY THE MICROBIOLOGY DEPARTMENT WITHIN ONE WEEK IF SPECIATION AND SENSITIVITIES ARE REQUIRED. Performed at [facility], [address] [place], [place] [address]    Report Status 09/27/2020 FINAL  Final  Blood Culture ID Panel (Reflexed)     Status: Abnormal   Collection Time: 09/11/2020  7:39 PM  Result Value Ref Range Status   Enterococcus faecalis NOT DETECTED NOT DETECTED Final   Enterococcus Faecium NOT DETECTED NOT DETECTED Final   Listeria monocytogenes NOT DETECTED NOT DETECTED Final   Staphylococcus species DETECTED (A) NOT DETECTED Final    Comment: CRITICAL RESULT CALLED TO, READ BACK BY AND VERIFIED WITH:  KRISTIN  MERRILL AT 1339 09/25/20 SDR    Staphylococcus aureus (BCID) NOT DETECTED NOT DETECTED Final   Staphylococcus epidermidis NOT DETECTED NOT DETECTED Final   Staphylococcus lugdunensis NOT DETECTED NOT DETECTED Final   Streptococcus species NOT DETECTED NOT DETECTED Final   Streptococcus agalactiae NOT DETECTED NOT DETECTED Final   Streptococcus pneumoniae NOT DETECTED NOT DETECTED Final   Streptococcus pyogenes NOT DETECTED NOT DETECTED Final   A.calcoaceticus-baumannii NOT DETECTED NOT DETECTED Final   Bacteroides fragilis NOT DETECTED NOT DETECTED Final   Enterobacterales NOT DETECTED NOT DETECTED Final   Enterobacter cloacae complex NOT DETECTED NOT DETECTED Final   Escherichia coli NOT DETECTED NOT DETECTED Final   Klebsiella aerogenes NOT DETECTED NOT DETECTED Final   Klebsiella oxytoca NOT DETECTED NOT DETECTED Final   Klebsiella pneumoniae NOT DETECTED NOT DETECTED Final   Proteus species NOT DETECTED NOT DETECTED Final   Salmonella species NOT DETECTED NOT DETECTED Final   Serratia marcescens NOT DETECTED NOT DETECTED Final   Haemophilus influenzae NOT DETECTED NOT  DETECTED Final   Neisseria meningitidis NOT DETECTED NOT DETECTED Final   Pseudomonas aeruginosa NOT DETECTED NOT DETECTED Final   Stenotrophomonas maltophilia NOT DETECTED NOT DETECTED Final   Candida albicans NOT DETECTED NOT DETECTED Final   Candida auris NOT DETECTED NOT DETECTED Final   Candida glabrata NOT DETECTED NOT DETECTED Final   Candida krusei NOT DETECTED NOT DETECTED Final   Candida parapsilosis NOT DETECTED NOT DETECTED Final   Candida tropicalis NOT DETECTED NOT DETECTED Final   Cryptococcus neoformans/gattii NOT DETECTED NOT DETECTED Final    Comment: Performed at [facility], [address] [place], [place] [address]  Resp Panel by RT-PCR (Flu A&B, Covid) Nasopharyngeal Swab  Status: Abnormal   Collection Time: 10/02/2020  7:40 PM   Specimen: Nasopharyngeal Swab; Nasopharyngeal(NP) swabs in vial transport medium  Result Value Ref Range Status   SARS Coronavirus 2 by RT PCR POSITIVE (A) NEGATIVE Final    Comment: RESULT CALLED TO, READ BACK BY AND VERIFIED WITH: KENDELL SHEETS  ON 09/18/2020 SKL (NOTE) SARS-CoV-2 target nucleic acids are DETECTED.  The SARS-CoV-2 RNA is generally detectable in upper respiratory specimens during the acute phase of infection. Positive results are indicative of the presence of the identified virus, but do not rule out bacterial infection or co-infection with other pathogens not detected by the test. Clinical correlation with patient history and other diagnostic information is necessary to determine patient infection status. The expected result is Negative.  Fact Sheet for Patients: BloggerCourse.com  Fact Sheet for Healthcare Providers: SeriousBroker.it  This test is not yet approved or cleared by the [place] FDA and  has been authorized for detection and/or diagnosis of SARS-CoV-2 by FDA under an Emergency Use Authorization (EUA).  This EUA will remain in  effect (meaning this test can  be used) for the duration of  the COVID-19 declaration under Section 564(b)(1) of the Act, 21 U.S.C. section 360bbb-3(b)(1), unless the authorization is terminated or revoked sooner.     Influenza A by PCR NEGATIVE NEGATIVE Final   Influenza B by PCR NEGATIVE NEGATIVE Final    Comment: (NOTE) The Xpert Xpress SARS-CoV-2/FLU/RSV plus assay is intended as an aid in the diagnosis of influenza from Nasopharyngeal swab specimens and should not be used as a sole basis for treatment. Nasal washings and aspirates are unacceptable for Xpert Xpress SARS-CoV-2/FLU/RSV testing.  Fact Sheet for Patients: BloggerCourse.com  Fact Sheet for Healthcare Providers: SeriousBroker.it  This test is not yet approved or cleared by the [place] FDA and has been authorized for detection and/or diagnosis of SARS-CoV-2 by FDA under an Emergency Use Authorization (EUA). This EUA will remain in effect (meaning this test can be used) for the duration of the COVID-19 declaration under Section 564(b)(1) of the Act, 21 U.S.C. section 360bbb-3(b)(1), unless the authorization is terminated or revoked.  Performed at [facility], [address] [place], [place] [address]   Blood Culture (routine x 2)     Status: None   Collection Time: 09/12/2020  7:44 PM   Specimen: BLOOD  Result Value Ref Range Status   Specimen Description BLOOD BLOOD LEFT HAND  Final   Special Requests   Final    BOTTLES DRAWN AEROBIC AND ANAEROBIC Blood Culture adequate volume   Culture   Final    NO GROWTH 5 DAYS Performed at [facility], [address], [place], [place] [address]    Report Status 09/29/2020 FINAL  Final  Urine culture     Status: None   Collection Time: 09/25/20  4:07 AM   Specimen: In/Out Cath Urine  Result Value Ref Range Status   Specimen Description   Final    IN/OUT CATH URINE Performed at [facility], [address], [place], [place] [address]    Special Requests   Final    NONE Performed at [facility], [address], [place], [place] [address]    Culture   Final    NO GROWTH Performed at [facility], [address], [place], [place] [address]    Report Status 09/26/2020 FINAL  Final  MRSA PCR Screening     Status: None   Collection Time:  09/25/20  4:07 AM   Specimen: Nasal Mucosa; Nasopharyngeal  Result Value Ref Range Status   MRSA by PCR NEGATIVE NEGATIVE Final    Comment:        The GeneXpert MRSA Assay (FDA approved for NASAL specimens only), is one component of a comprehensive MRSA colonization surveillance program. It is not intended to diagnose MRSA infection nor to guide or monitor treatment for MRSA infections. Performed at [facility], [address], [place], [place] [address]           IMAGING    No results found.   Nutrition Status: Nutrition Problem: Inadequate oral intake Etiology: inability to eat Signs/Symptoms: NPO status Interventions: Tube feeding,Prostat,MVI     Indwelling Urinary Catheter continued, requirement due to   Reason to continue Indwelling Urinary Catheter strict Intake/Output monitoring for hemodynamic instability   Central Line/ continued, requirement due to  Reason to continue [employer] Monitoring of central venous pressure or other hemodynamic parameters and poor IV access   Ventilator continued, requirement due to severe respiratory failure   Ventilator Sedation RASS 0 to -2      ASSESSMENT AND PLAN SYNOPSIS 59 year old with ARDS, COVID-19 pneumonia, bilateral DVT and small right upper lobe PE   Acute respiratory failure.   Acute COVID19 pneumonia -Remdesevir antiviral - pharmacy protocol 5 d -vitamin C -zinc -decadron 6mg  IV daily   - utilize external urinary catheter if possible -Self prone if patient can tolerate   for bronchopulmonary hygiene when  able -d/c hepatotoxic medications while on remdesevir -supportive care with ICU telemetry monitoring -PT/OT when possible -procalcitonin, CRP and ferritin trending Use intermittent paralytics for vent dyssynchrony. Repeat ABG.  Prone if P/F ratio less than 150 Goal plateau pressure less than 30, goal driving pressure less than 15 Continue remdesivir, steroids Continue baricitinib   NEUROLOGY Acute toxic metabolic encephalopathy, need for sedation Continue fentanyl, propofol. Increase RASS goal to -2-3 as she is dyssynchronous with the vent Intermittent paralytics  SHOCK-SEPSIS Off pressors   CARDIAC ICU monitoring  ID Received ceftriaxone, azithromycin from 12/16- 12/17 1/2 blood cultures are growing staph hominis.  This is likely a contaminant DC vancomycin as kidney function is getting worse  GI GI PROPHYLAXIS as indicated   DIET-->TF's as tolerated Constipation protocol as indicated   ENDO - will use ICU hypoglycemic\Hyperglycemia protocol if indicated Continue Levemir, SSI   DVT/PE Continue heparin drip  RENAL AKI Creatinine continues to increase. Nephrology consult.  Likely headed towards dialysis but no acute need to start today  ELECTROLYTES -follow labs as needed -replace as needed -pharmacy consultation and following  GOALS OF CARE Updated friend 09-16-1990 and sister Kennon Rounds on 12/19 over telephone.  No answer when I called brother Duane Discussed worsening clinical status, possible need for dialysis  Decision made to change status to limited code with no CPR or shocks in case of cardiac arrest. We will try dialysis on a time-limited basis if needed but no prolonged mechanical or dialysis support.   DVT/GI PRX ordered and assessed TRANSFUSIONS AS NEEDED MONITOR FSBS I Assessed the need for Labs I Assessed the need for Foley I Assessed the need for Central Venous Line Family Discussion when available I Assessed the need for Mobilization I  made an Assessment of medications to be adjusted accordingly Safety Risk assessment completed   CASE DISCUSSED IN MULTIDISCIPLINARY ROUNDS WITH ICU TEAM  The patient is critically ill with multiple organ system failure and requires high complexity decision making for assessment and support,  frequent evaluation and titration of therapies, advanced monitoring, review of radiographic studies and interpretation of complex data.   Critical Care Time devoted to patient care services, exclusive of separately billable procedures, described in this note is 33 minutes.    Vida Rigger, M.D.  Pulmonary & Critical Care Medicine  [facility] \

## 2020-09-29 NOTE — Progress Notes (Signed)
[place], [place] 09/29/20  Subjective:   Hospital day # 5  cvs:  no pressors Pulm:vent assisted; Fio2 35%;  Gi: TF ongoing Renal: 12/20 0701 - 12/21 0700 In: 2072 [I.V.:1090; NG/GT:982] Out: 770 [Urine:770] Lab Results  Component Value Date   CREATININE 4.06 (H) 09/29/2020   CREATININE 3.31 (H) 09/28/2020   CREATININE 3.26 (H) 09/27/2020   Critically ill appearing  Objective:  Vital signs in last 24 hours:  Temp:  [95.54 F (35.3 C)-98.06 F (36.7 C)] 96.62 F (35.9 C) (12/21 0800) Pulse Rate:  [79-112] 95 (12/21 0800) Resp:  [19-33] 26 (12/21 0800) BP: (92-159)/(59-91) 143/81 (12/21 0800) SpO2:  [100 %] 100 % (12/21 0800) FiO2 (%):  [40 %-60 %] 40 % (12/21 0821) Weight:  [73.3 kg] 73.3 kg (12/21 0425)  Weight change: 4.4 kg Filed Weights   09/27/20 0221 09/28/20 0431 09/29/20 0425  Weight: 67 kg 68.9 kg 73.3 kg    Intake/Output:    Intake/Output Summary (Last 24 hours) at 09/29/2020 0942 Last data filed at 09/29/2020 0600 Gross per 24 hour  Intake 1938.9 ml  Output 570 ml  Net 1368.9 ml    Physical Exam: General:  No acute distress, laying in the bed, critically ill  HEENT  anicteric, ETT in place  Pulm/lungs  vent assisted  CVS/Heart  regular rhythm, tachycardic  Abdomen:   Soft,   Extremities:  trace peripheral edema  Neurologic:  sedated  Skin:  No acute rashes  Foley in place    Basic Metabolic Panel:  Recent Labs  Lab 09/25/20 0621 09/26/20 0426 09/27/20 0511 09/28/20 0322 09/29/20 0621  NA 138 138 136 139 143  K 4.7 3.7 3.7 4.2 3.9  CL 104 105 105 106 109  CO2 20* 21* 20* 21* 19*  GLUCOSE 213* 169* 160* 186* 161*  BUN 21* 46* 79* 86* 100*  CREATININE 1.14* 2.73* 3.26* 3.31* 4.06*  CALCIUM 7.8* 7.7* 7.8* 8.0* 7.8*  MG 2.1 2.2 2.4 2.7* 2.8*  PHOS 6.4* 3.8 5.1* 8.8* 7.1*     CBC: Recent Labs  Lab 09/29/2020 1942 09/25/20 0621 09/26/20 0426 09/28/20 0322 09/29/20 0621  WBC 9.6 15.6* 11.1*  18.9* 10.3  NEUTROABS 8.4* 13.7* 9.0*  --   --   HGB 13.9 13.3 10.6* 11.9* 10.7*  HCT 42.1 41.6 32.8* 37.6 32.7*  MCV 85.6 90.6 88.4 90.2 89.1  PLT 323 270 276 427* 331      Lab Results  Component Value Date   HEPBSAG NON REACTIVE 09/27/2020      Microbiology:  Recent Results (from the past 240 hour(s))  Blood Culture (routine x 2)     Status: Abnormal   Collection Time: 09/27/2020  7:39 PM   Specimen: BLOOD  Result Value Ref Range Status   Specimen Description   Final    BLOOD LEFT ANTECUBITAL Performed at [facility], [address], [place], [place] [address]    Special Requests   Final    BOTTLES DRAWN AEROBIC AND ANAEROBIC Blood Culture adequate volume Performed at [facility], [address] [place], [place] [address]    Culture  Setup Time   Final    Organism ID to follow [place] TO, READ BACK BY AND VERIFIED WITH: [facility] MERRILL AT [id number] 09/25/20 [place] Performed at [facility], [address], [place], [place] [address]    Culture (A)  Final    STAPHYLOCOCCUS HOMINIS THE SIGNIFICANCE OF  ISOLATING THIS ORGANISM FROM A SINGLE SET OF BLOOD CULTURES WHEN MULTIPLE SETS ARE DRAWN IS UNCERTAIN. PLEASE NOTIFY THE MICROBIOLOGY DEPARTMENT WITHIN ONE WEEK IF SPECIATION AND SENSITIVITIES ARE REQUIRED. Performed at [facility], [place] [address] [place], [place] [address]    Report Status 09/27/2020 FINAL  Final  Blood Culture ID Panel (Reflexed)     Status: Abnormal   Collection Time: 09/29/2020  7:39 PM  Result Value Ref Range Status   Enterococcus faecalis NOT DETECTED NOT DETECTED Final   Enterococcus Faecium NOT DETECTED NOT DETECTED Final   Listeria monocytogenes NOT DETECTED NOT DETECTED Final   Staphylococcus species DETECTED (A) NOT DETECTED Final    Comment: CRITICAL RESULT CALLED TO, READ BACK BY AND VERIFIED WITH:  KRISTIN  MERRILL AT [id number] 09/25/20 SDR     Staphylococcus aureus (BCID) NOT DETECTED NOT DETECTED Final   Staphylococcus epidermidis NOT DETECTED NOT DETECTED Final   Staphylococcus lugdunensis NOT DETECTED NOT DETECTED Final   Streptococcus species NOT DETECTED NOT DETECTED Final   Streptococcus agalactiae NOT DETECTED NOT DETECTED Final   Streptococcus pneumoniae NOT DETECTED NOT DETECTED Final   Streptococcus pyogenes NOT DETECTED NOT DETECTED Final   A.calcoaceticus-baumannii NOT DETECTED NOT DETECTED Final   Bacteroides fragilis NOT DETECTED NOT DETECTED Final   Enterobacterales NOT DETECTED NOT DETECTED Final   Enterobacter cloacae complex NOT DETECTED NOT DETECTED Final   Escherichia coli NOT DETECTED NOT DETECTED Final   Klebsiella aerogenes NOT DETECTED NOT DETECTED Final   Klebsiella oxytoca NOT DETECTED NOT DETECTED Final   Klebsiella pneumoniae NOT DETECTED NOT DETECTED Final   Proteus species NOT DETECTED NOT DETECTED Final   Salmonella species NOT DETECTED NOT DETECTED Final   Serratia marcescens NOT DETECTED NOT DETECTED Final   Haemophilus influenzae NOT DETECTED NOT DETECTED Final   Neisseria meningitidis NOT DETECTED NOT DETECTED Final   Pseudomonas aeruginosa NOT DETECTED NOT DETECTED Final   Stenotrophomonas maltophilia NOT DETECTED NOT DETECTED Final   Candida albicans NOT DETECTED NOT DETECTED Final   Candida auris NOT DETECTED NOT DETECTED Final   Candida glabrata NOT DETECTED NOT DETECTED Final   Candida krusei NOT DETECTED NOT DETECTED Final   Candida parapsilosis NOT DETECTED NOT DETECTED Final   Candida tropicalis NOT DETECTED NOT DETECTED Final   Cryptococcus neoformans/gattii NOT DETECTED NOT DETECTED Final    Comment: Performed at [facility], [address] [place], [place] [address]  Resp Panel by RT-PCR (Flu A&B, Covid) Nasopharyngeal Swab     Status: Abnormal   Collection Time: 09/10/2020  7:40 PM   Specimen: Nasopharyngeal Swab; Nasopharyngeal(NP) swabs in vial transport medium   Result Value Ref Range Status   SARS Coronavirus 2 by RT PCR POSITIVE (A) NEGATIVE Final    Comment: RESULT CALLED TO, READ BACK BY AND VERIFIED WITH: KENDELL SHEETS _0  ON 09/27/2020 [facility] (NOTE) SARS-CoV-2 target nucleic acids are DETECTED.  The SARS-CoV-2 RNA is generally detectable in upper respiratory specimens during the acute phase of infection. Positive results are indicative of the presence of the identified virus, but do not rule out bacterial infection or co-infection with other pathogens not detected by the test. Clinical correlation with patient history and other diagnostic information is necessary to determine patient infection status. The expected result is Negative.  Fact Sheet for Patients: EntrepreneurPulse.com.au  Fact Sheet for Healthcare Providers: IncredibleEmployment.be  This test is not yet approved or cleared by the [place] FDA and  has been authorized for detection and/or diagnosis of SARS-CoV-2  by FDA under an Emergency Use Authorization (EUA).  This EUA will remain in effect (meaning this test can  be used) for the duration of  the COVID-19 declaration under Section 564(b)(1) of the Act, 21 U.S.C. section 360bbb-3(b)(1), unless the authorization is terminated or revoked sooner.     Influenza A by PCR NEGATIVE NEGATIVE Final   Influenza B by PCR NEGATIVE NEGATIVE Final    Comment: (NOTE) The Xpert Xpress SARS-CoV-2/FLU/RSV plus assay is intended as an aid in the diagnosis of influenza from Nasopharyngeal swab specimens and should not be used as a sole basis for treatment. Nasal washings and aspirates are unacceptable for Xpert Xpress SARS-CoV-2/FLU/RSV testing.  Fact Sheet for Patients: EntrepreneurPulse.com.au  Fact Sheet for Healthcare Providers: IncredibleEmployment.be  This test is not yet approved or cleared by the [place] FDA and has been authorized for  detection and/or diagnosis of SARS-CoV-2 by FDA under an Emergency Use Authorization (EUA). This EUA will remain in effect (meaning this test can be used) for the duration of the COVID-19 declaration under Section 564(b)(1) of the Act, 21 U.S.C. section 360bbb-3(b)(1), unless the authorization is terminated or revoked.  Performed at [facility], [address] [place], [place] [address]   Blood Culture (routine x 2)     Status: None   Collection Time: 09/22/2020  7:44 PM   Specimen: BLOOD  Result Value Ref Range Status   Specimen Description BLOOD BLOOD LEFT HAND  Final   Special Requests   Final    BOTTLES DRAWN AEROBIC AND ANAEROBIC Blood Culture adequate volume   Culture   Final    NO GROWTH 5 DAYS Performed at [facility], [address] [place], [place] [address]    Report Status 09/29/2020 FINAL  Final  Urine culture     Status: None   Collection Time: 09/25/20  4:07 AM   Specimen: In/Out Cath Urine  Result Value Ref Range Status   Specimen Description   Final    IN/OUT CATH URINE Performed at [facility], [address] [place], [place] [address]    Special Requests   Final    NONE Performed at [facility], [address] [place], [place] [address]    Culture   Final    NO GROWTH Performed at [facility], [place] [address], [place], [place] [address]    Report Status 09/26/2020 FINAL  Final  MRSA PCR Screening     Status: None   Collection Time: 09/25/20  4:07 AM   Specimen: Nasal Mucosa; Nasopharyngeal  Result Value Ref Range Status   MRSA by PCR NEGATIVE NEGATIVE Final    Comment:        The GeneXpert MRSA Assay (FDA approved for NASAL specimens only), is one component of a comprehensive MRSA colonization surveillance program. It is not intended to diagnose MRSA infection nor to guide or monitor treatment for MRSA infections. Performed at [facility], [address] [place], [place]  [address]     Coagulation Studies: No results for input(s): LABPROT, INR in the last 72 hours.  Urinalysis: No results for input(s): COLORURINE, LABSPEC, PHURINE, GLUCOSEU, HGBUR, BILIRUBINUR, KETONESUR, PROTEINUR, UROBILINOGEN, NITRITE, LEUKOCYTESUR in the last 72 hours.  Invalid input(s): APPERANCEUR    Imaging: US RENAL  Result Date: 09/27/2020 CLINICAL DATA:  ATN EXAM: RENAL / URINARY TRACT ULTRASOUND COMPLETE COMPARISON:  None. FINDINGS: Right Kidney: Renal measurements: 1.3 x 6.8 x 5.7 cm = volume: 232 mL. Diffusely increased echogenicity.  No hydronephrosis. Left Kidney: Renal measurements: 10.7 x 5.2 x 4.8 cm = volume: 140 mL. Diffusely increased echogenicity. No hydronephrosis. Bladder: Bladder is completely decompressed and is not visualized. Other: None. IMPRESSION: 1. Increased echogenicity of bilateral kidneys as can be seen in medical renal disease. No hydronephrosis. Electronically Signed   By: Valentino Saxon MD   On: 09/27/2020 14:54   DG Chest Port 1 View  Result Date: 09/28/2020 CLINICAL DATA:  Respiratory failure. EXAM: PORTABLE CHEST 1 VIEW COMPARISON:  09/27/2020. FINDINGS: Endotracheal tube and NG tube in stable position. Heart size normal. Diffuse bilateral interstitial prominence again noted without interim change. Tiny bilateral pleural effusions can not be excluded. No pneumothorax. IMPRESSION: 1. Lines and tubes in stable position. 2. Diffuse bilateral interstitial prominence again noted without interim change. Tiny bilateral pleural effusions can not be excluded. Electronically Signed   By: Marcello Moores  Register   On: 09/28/2020 06:45   DG Chest Port 1 View  Result Date: 09/27/2020 CLINICAL DATA:  ARDS. EXAM: PORTABLE CHEST 1 VIEW COMPARISON:  September 27, 2020 FINDINGS: The endotracheal tube terminates above the carina. The enteric tube extends below the left hemidiaphragm. The heart size is stable. There are bilateral pleural effusions and bilateral airspace  opacities, similar to prior study. There is no pneumothorax. There are old healed left-sided rib fractures. IMPRESSION: 1. Lines and tubes as above. 2. No significant interval change. Electronically Signed   By: Constance Holster M.D.   On: 09/27/2020 21:04     Medications:   . sodium chloride 150 mL/hr at 09/26/20 2000  . sodium chloride 75 mL/hr at 09/29/20 0037  . feeding supplement (VITAL AF 1.2 CAL) 45 mL/hr at 09/29/20 0600  . fentaNYL infusion INTRAVENOUS 200 mcg/hr (09/29/20 0600)  . heparin Stopped (09/29/20 0935)  . propofol (DIPRIVAN) infusion 60 mcg/kg/min (09/29/20 0913)   . albuterol  2 puff Inhalation Q6H  . vitamin C  500 mg Per Tube Daily  . baricitinib  1 mg Per Tube Daily  . chlorhexidine gluconate (MEDLINE KIT)  15 mL Mouth Rinse BID  . Chlorhexidine Gluconate Cloth  6 each Topical Daily  . docusate  100 mg Per Tube BID  . feeding supplement (PROSource TF)  45 mL Per Tube BID  . fentaNYL (SUBLIMAZE) injection  50 mcg Intravenous Once  . folic acid  1 mg Per Tube Daily  . insulin aspart  0-15 Units Subcutaneous Q4H  . insulin detemir  5 Units Subcutaneous BID  . mouth rinse  15 mL Mouth Rinse 10 times per day  . multivitamin with minerals  1 tablet Per Tube Daily  . pantoprazole (PROTONIX) IV  40 mg Intravenous Q24H  . polyethylene glycol  17 g Per Tube Daily  . predniSONE  50 mg Per Tube Daily  . thiamine  100 mg Per Tube Daily  . zinc sulfate  220 mg Per Tube Daily   acetaminophen, fentaNYL, midazolam, midazolam, morphine injection, ondansetron **OR** ondansetron (ZOFRAN) IV, vecuronium  Assessment/ Plan:  59 y.o. female with  hypertension, anxiety, anemia who was brought to the ER on December 16 with a chief complaint of cough shortness of breath   admitted on 09/23/2020 for Edema [R60.9] Lactic acid acidosis [E87.2] Acute respiratory failure with hypoxia ([place]) [J96.01] Pneumonia due to COVID-19 virus [U07.1, J12.82]   # Acute Kidney injury with  proteinuria AKI likely secondary to ATN and iv contrast exposure (12/172021) 12/20 0701 - 12/21 0700 In: 2072 [I.V.:1090; NG/GT:982] Out: 770 [Urine:770]   Lab  Results  Component Value Date   CREATININE 4.06 (H) 09/29/2020   CREATININE 3.31 (H) 09/28/2020   CREATININE 3.26 (H) 09/27/2020    S Creatinine trend is worsening but patient is non oliguric resp acidosis has improved Reviewing lab trends, patient may end up needing HD soon Discussed with ICU team to obtain dialysis cathter for possible dialysis on wednesday Will continue to monitor closely  # Covid 19 pneumonia Complicated by Acute resp failure with hypoxia Small pulmonary embolism right upper lobe pulmonary arteries in a distal pulmonary artery- requiring iv anticoagulation  # hyperphosphatemia PTH pending CK normal Phos expected to improve with improving UOP or dialysis     LOS: 5   12/21/20219:42 AM  [facility] [place], [place]  Note: This note was prepared with Dragon dictation. Any transcription errors are unintentional

## 2020-09-29 NOTE — Procedures (Signed)
Central Venous Catheter Insertion Procedure Note  KALIYAH GLADMAN  [id number]  04-26-1961  Date:09/29/20  Time:12:23 PM   Provider Performing:Shawnte Winton D Elvina Sidle   Procedure: Insertion of Non-tunneled Central Venous Catheter(36556)with US guidance (37858)    Indication(s) Hemodialysis  Consent Risks of the procedure as well as the alternatives and risks of each were explained to the patient and/or caregiver.  Consent for the procedure was obtained and is signed in the bedside chart  Anesthesia Topical only with 1% lidocaine   Timeout Verified patient identification, verified procedure, site/side was marked, verified correct patient position, special equipment/implants available, medications/allergies/relevant history reviewed, required imaging and test results available.  Sterile Technique Maximal sterile technique including full sterile barrier drape, hand hygiene, sterile gown, sterile gloves, mask, hair covering, sterile ultrasound probe cover (if used).  Procedure Description Area of catheter insertion was cleaned with chlorhexidine and draped in sterile fashion.   With real-time ultrasound guidance a HD catheter was placed into the left internal jugular vein.  Nonpulsatile blood flow and easy flushing noted in all ports.  The catheter was sutured in place and sterile dressing applied.  Complications/Tolerance None; patient tolerated the procedure well. Chest X-ray is ordered to verify placement for internal jugular or subclavian cannulation.  Chest x-ray is not ordered for femoral cannulation.  EBL Minimal  Specimen(s) None   Line was secured at the 19 cm mark.  BIOPATCH was applied to the insertion site.    Harlon Ditty, AGACNP-BC  Pulmonary & Critical Care Medicine Pager: [phone]

## 2020-09-29 NOTE — Consult Note (Addendum)
ANTICOAGULATION CONSULT NOTE   Pharmacy Consult for Heparin Indication: DVT/PE  Allergies  Allergen Reactions  . Hydrocodone-Acetaminophen Nausea And Vomiting and Itching    Patient Measurements: Height: 5\' 4"  (162.6 cm) Weight: 73.3 kg (161 lb 9.6 oz) IBW/kg (Calculated) : 54.7 Heparin Dosing Weight: 63.5 kg  Vital Signs: Temp: 97.34 F (36.3 C) (12/21 1200) Temp Source: Esophageal (12/21 0600) BP: 136/80 (12/21 1200) Pulse Rate: 93 (12/21 1200)  Labs: Recent Labs    09/27/20 0511 09/28/20 0322 09/28/20 0622 09/28/20 1622 09/29/20 0621  HGB  --  11.9*  --   --  10.7*  HCT  --  37.6  --   --  32.7*  PLT  --  427*  --   --  331  HEPARINUNFRC 0.58 1.84* 1.88* 1.08* 0.36  CREATININE 3.26* 3.31*  --   --  4.06*  CKTOTAL  --   --   --   --  40    Estimated Creatinine Clearance: 14.4 mL/min (A) (by C-G formula based on SCr of 4.06 mg/dL (H)).   Medical History: Past Medical History:  Diagnosis Date  . Anemia    noted during hip surgery   . Anxiety    states no longer issue  . Frequent headaches   . Hypertension   . Lumbago   . 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 Patient was admitted for acute hypoxic respiratory failure 2/2 PNA due to COVID-19. Pharmacy consulted for heparin dosing and monitoring for ACS and pulmonary embolism. CT chest with small pulmonary embolism right upper lobe. Trop elevated 260. CBC stable. No DOAC PTA.   Goal of Therapy:  Heparin level 0.3-0.7 units/ml Monitor platelets by anticoagulation protocol: Yes   12/17 0621 HL = 0.2, bolus 1000 units, increased rate to 900 units/hr  12/17 1802 HL = 0.26, bolus 900 units, increased rate to 1000 units/hr 12/18 0057 HL = 0.29, bolus 900 units, increased rate to 1100 units/hr 12/18 0814 HL = 0.42 x1, rate continued at 1100 units/hr 12/18 1500 HL =  0.40 x2 12/19 0511 HL = 0.58, therapeutic X 3  12/20 0322 HL = 1.84 , supratherapeutic -1100 12/20 0610 HL = 2.0     supratherapeutic-850 12/21 0621 HL = 0.36 therapeutic x 1   Plan:  12/21 0621 HL 0.36 therapeutic. Continue heparin drip at 700 units/hr. Heparin drip was paused 0930 - 1400 for CVC placement. Restarted around 1430. Will recheck HL 12/22 0000. Patient previously therapeutic on heparin prior to hold. Continue to monitor CBC daily.  1/23, PharmD Clinical Pharmacist 09/29/2020 2:33 PM

## 2020-09-30 LAB — CBC
HCT: 31.1 % — ABNORMAL LOW (ref 36.0–46.0)
Hemoglobin: 10.6 g/dL — ABNORMAL LOW (ref 12.0–15.0)
MCH: 29.4 pg (ref 26.0–34.0)
MCHC: 34.1 g/dL (ref 30.0–36.0)
MCV: 86.4 fL (ref 80.0–100.0)
Platelets: 330 10*3/uL (ref 150–400)
RBC: 3.6 MIL/uL — ABNORMAL LOW (ref 3.87–5.11)
RDW: 15.7 % — ABNORMAL HIGH (ref 11.5–15.5)
WBC: 13 10*3/uL — ABNORMAL HIGH (ref 4.0–10.5)
nRBC: 0 % (ref 0.0–0.2)

## 2020-09-30 LAB — RENAL FUNCTION PANEL
Albumin: 2.2 g/dL — ABNORMAL LOW (ref 3.5–5.0)
Anion gap: 15 (ref 5–15)
BUN: 105 mg/dL — ABNORMAL HIGH (ref 6–20)
CO2: 21 mmol/L — ABNORMAL LOW (ref 22–32)
Calcium: 8.3 mg/dL — ABNORMAL LOW (ref 8.9–10.3)
Chloride: 109 mmol/L (ref 98–111)
Creatinine, Ser: 4.08 mg/dL — ABNORMAL HIGH (ref 0.44–1.00)
GFR, Estimated: 12 mL/min — ABNORMAL LOW (ref >60)
Glucose, Bld: 152 mg/dL — ABNORMAL HIGH (ref 70–99)
Phosphorus: 6.4 mg/dL — ABNORMAL HIGH (ref 2.5–4.6)
Potassium: 4 mmol/L (ref 3.5–5.1)
Sodium: 145 mmol/L (ref 135–145)

## 2020-09-30 LAB — BLOOD GAS, ARTERIAL
Acid-base deficit: 7.8 mmol/L — ABNORMAL HIGH (ref 0.0–2.0)
Bicarbonate: 18.8 mmol/L — ABNORMAL LOW (ref 20.0–28.0)
FIO2: 30
MECHVT: 520 mL
O2 Saturation: 92.7 %
PEEP: 12 cmH2O
Patient temperature: 37
RATE: 30 resp/min
pCO2 arterial: 42 mmHg (ref 32.0–48.0)
pH, Arterial: 7.26 — ABNORMAL LOW (ref 7.350–7.450)
pO2, Arterial: 76 mmHg — ABNORMAL LOW (ref 83.0–108.0)

## 2020-09-30 LAB — GLUCOSE, CAPILLARY
Glucose-Capillary: 127 mg/dL — ABNORMAL HIGH (ref 70–99)
Glucose-Capillary: 106 mg/dL — ABNORMAL HIGH (ref 70–99)
Glucose-Capillary: 215 mg/dL — ABNORMAL HIGH (ref 70–99)
Glucose-Capillary: 200 mg/dL — ABNORMAL HIGH (ref 70–99)
Glucose-Capillary: 186 mg/dL — ABNORMAL HIGH (ref 70–99)
Glucose-Capillary: 153 mg/dL — ABNORMAL HIGH (ref 70–99)

## 2020-09-30 LAB — HEPARIN LEVEL (UNFRACTIONATED)
Heparin Unfractionated: 0.23 IU/mL — ABNORMAL LOW (ref 0.30–0.70)
Heparin Unfractionated: 0.25 IU/mL — ABNORMAL LOW (ref 0.30–0.70)
Heparin Unfractionated: 0.37 IU/mL (ref 0.30–0.70)

## 2020-09-30 LAB — TRIGLYCERIDES: Triglycerides: 360 mg/dL — ABNORMAL HIGH (ref <150)

## 2020-09-30 LAB — PARATHYROID HORMONE, INTACT (NO CA): PTH: 109 pg/mL — ABNORMAL HIGH (ref 15–65)

## 2020-09-30 MED ORDER — FUROSEMIDE 10 MG/ML IJ SOLN
40.0000 mg | Freq: Once | INTRAMUSCULAR | Status: AC
  Administered 2020-09-30: 13:00:00 40 mg via INTRAVENOUS
  Filled 2020-09-30: qty 4

## 2020-09-30 MED ORDER — SODIUM BICARBONATE 8.4 % IV SOLN
100.0000 meq | Freq: Once | INTRAVENOUS | Status: AC
  Administered 2020-09-30: 06:00:00 100 meq via INTRAVENOUS
  Filled 2020-09-30: qty 100

## 2020-09-30 MED ORDER — POTASSIUM CHLORIDE 20 MEQ PO PACK
40.0000 meq | PACK | Freq: Once | ORAL | Status: AC
  Administered 2020-09-30: 17:00:00 40 meq

## 2020-09-30 MED ORDER — POTASSIUM CHLORIDE 20 MEQ PO PACK
40.0000 meq | PACK | Freq: Once | ORAL | Status: DC
  Filled 2020-09-30: qty 2

## 2020-09-30 NOTE — Consult Note (Signed)
ANTICOAGULATION CONSULT NOTE   Pharmacy Consult for Heparin Indication: DVT/PE  Allergies  Allergen Reactions  . Hydrocodone-Acetaminophen Nausea And Vomiting and Itching    Patient Measurements: Height: 5\' 4"  (162.6 cm) Weight: 73.3 kg (161 lb 9.6 oz) IBW/kg (Calculated) : 54.7 Heparin Dosing Weight: 63.5 kg  Vital Signs: Temp: 97.16 F (36.2 C) (12/22 0100) Temp Source: Esophageal (12/22 0000) BP: 125/73 (12/22 0000) Pulse Rate: 88 (12/22 0100)  Labs: Recent Labs    09/27/20 0511 09/28/20 0322 09/28/20 0622 09/28/20 1622 09/29/20 0621 09/29/20 2336  HGB  --  11.9*  --   --  10.7*  --   HCT  --  37.6  --   --  32.7*  --   PLT  --  427*  --   --  331  --   HEPARINUNFRC 0.58 1.84*   < > 1.08* 0.36 0.23*  CREATININE 3.26* 3.31*  --   --  4.06*  --   CKTOTAL  --   --   --   --  40  --    < > = values in this interval not displayed.    Estimated Creatinine Clearance: 14.4 mL/min (A) (by C-G formula based on SCr of 4.06 mg/dL (H)).   Medical History: Past Medical History:  Diagnosis Date  . Anemia    noted during hip surgery   . Anxiety    states no longer issue  . Frequent headaches   . Hypertension   . Lumbago   . 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 Patient was admitted for acute hypoxic respiratory failure 2/2 PNA due to COVID-19. Pharmacy consulted for heparin dosing and monitoring for ACS and pulmonary embolism. CT chest with small pulmonary embolism right upper lobe. Trop elevated 260. CBC stable. No DOAC PTA.   Goal of Therapy:  Heparin level 0.3-0.7 units/ml Monitor platelets by anticoagulation protocol: Yes   12/17 0621 HL = 0.2, bolus 1000 units, increased rate to 900 units/hr  12/17 1802 HL = 0.26, bolus 900 units, increased rate to 1000 units/hr 12/18 0057 HL = 0.29, bolus 900 units, increased rate  to 1100 units/hr 12/18 0814 HL = 0.42 x1, rate continued at 1100 units/hr 12/18 1500 HL = 0.40 x2 12/19 0511 HL = 0.58, therapeutic X 3  12/20 0322 HL = 1.84 , supratherapeutic -1100 12/20 0610 HL = 2.0     supratherapeutic-850 12/21 0621 HL = 0.36 therapeutic x 1 12/21 2336 HL = 0.23, SUBtherapeutic   Plan:  12/21 2336 HL 0.23 SUBtherapeutic. Increase heparin drip to 750 units/hr.  Will recheck HL ~ 6 hrs after rate increased.  Continue to monitor CBC daily.  1/22, PharmD Clinical Pharmacist 09/30/2020 1:15 AM

## 2020-09-30 NOTE — Consult Note (Signed)
ANTICOAGULATION CONSULT NOTE   Pharmacy Consult for Heparin Indication: DVT/PE  Allergies  Allergen Reactions  . Hydrocodone-Acetaminophen Nausea And Vomiting and Itching    Patient Measurements: Height: 5\' 4"  (162.6 cm) Weight: 75 kg (165 lb 5.5 oz) IBW/kg (Calculated) : 54.7 Heparin Dosing Weight: 63.5 kg  Vital Signs: Temp: 98.24 F (36.8 C) (12/22 1100) BP: 168/92 (12/22 1100) Pulse Rate: 111 (12/22 1100)  Labs: Recent Labs    09/28/20 0322 09/28/20 0622 09/29/20 0621 09/29/20 2336 09/30/20 0720 09/30/20 0801  HGB 11.9*  --  10.7*  --   --  10.6*  HCT 37.6  --  32.7*  --   --  31.1*  PLT 427*  --  331  --   --  330  HEPARINUNFRC 1.84*   < > 0.36 0.23*  --  0.25*  CREATININE 3.31*  --  4.06*  --  4.08*  --   CKTOTAL  --   --  40  --   --   --    < > = values in this interval not displayed.    Estimated Creatinine Clearance: 14.5 mL/min (A) (by C-G formula based on SCr of 4.08 mg/dL (H)).   Medical History: Past Medical History:  Diagnosis Date  . Anemia    noted during hip surgery   . Anxiety    states no longer issue  . Frequent headaches   . Hypertension   . Lumbago   . 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 Patient was admitted for acute hypoxic respiratory failure 2/2 PNA due to COVID-19. Pharmacy consulted for heparin dosing and monitoring for ACS and pulmonary embolism. CT chest with small pulmonary embolism right upper lobe. Trop elevated 260. CBC stable. No DOAC PTA.   Goal of Therapy:  Heparin level 0.3-0.7 units/ml Monitor platelets by anticoagulation protocol: Yes   12/17 0621 HL = 0.2, bolus 1000 units, increased rate to 900 units/hr  12/17 1802 HL = 0.26, bolus 900 units, increased rate to 1000 units/hr 12/18 0057 HL = 0.29, bolus 900 units, increased rate to 1100 units/hr 12/18 0814 HL = 0.42  x1, rate continued at 1100 units/hr 12/18 1500 HL = 0.40 x2 12/19 0511 HL = 0.58, therapeutic X 3  12/20 0322 HL = 1.84 , supratherapeutic -1100 12/20 0610 HL = 2.0     supratherapeutic-850 12/21 0621 HL = 0.36 therapeutic x 1 12/21 2336 HL = 0.23, SUBtherapeutic 12/22 0801 HL = 0.25, subtherapeutic   Plan:  12/22 0801 HL 0.25 subtherapeutic. Increase heparin drip to 850 units/hr. Recheck HL at 1700 to confirm. CBC daily while on heparin drip.  1/23, PharmD Clinical Pharmacist 09/30/2020 12:38 PM

## 2020-09-30 NOTE — Progress Notes (Signed)
CRITICAL CARE NOTE  59 yo female admitted with acute hypoxic respiratory failure secondary to pneumonia due to COVID-19 requiring HHFNC and NRB now intubated and severe resp failure complicated by DVT and PE  12/16 intubated 12/17 high risk for cardiac arrest, on pressors 12/18 Off pressors 12/19 worsening renal function.  Nephrology consult 09/28/20- patient was noted to be severely acidemic with HAGMA in am s/p 2 amp bicarb with repeat ABG significantly improved 12/21- patient will require HD, will place vascath today. Meds reviewed and changed, TG elevated on propofol. DCd IVF. 12/22-  Patient remains critically ill. Reviewed plan with nephrologist, patient will not have HD today.  CC  follow up respiratory failure  SUBJECTIVE FiO2 down to 40% Dyssynchronous with the ventilator Renal function is worse  Vent Mode: PRVC FiO2 (%):  [30 %] 30 % Set Rate:  [30 bmp] 30 bmp Vt Set:  [520 mL] 520 mL PEEP:  [12 cmH20] 12 cmH20 Plateau Pressure:  [25 cmH20] 25 cmH20  CBC    Component Value Date/Time   WBC 13.0 (H) 09/30/2020 0801   RBC 3.60 (L) 09/30/2020 0801   HGB 10.6 (L) 09/30/2020 0801   HGB 13.6 07/08/2012 2019   HCT 31.1 (L) 09/30/2020 0801   HCT 39.4 07/08/2012 2019   PLT 330 09/30/2020 0801   PLT 202 07/08/2012 2019   MCV 86.4 09/30/2020 0801   MCV 91 07/08/2012 2019   MCH 29.4 09/30/2020 0801   MCHC 34.1 09/30/2020 0801   RDW 15.7 (H) 09/30/2020 0801   RDW 12.6 07/08/2012 2019   LYMPHSABS 1.3 09/26/2020 0426   MONOABS 0.7 09/26/2020 0426   EOSABS 0.0 09/26/2020 0426   BASOSABS 0.0 09/26/2020 0426   BMP Latest Ref Rng & Units 09/30/2020 09/29/2020 09/28/2020  Glucose 70 - 99 mg/dL 315(Q) 008(Q) 761(P)  BUN 6 - 20 mg/dL 509(T) 267(T) 24(P)  Creatinine 0.44 - 1.00 mg/dL 8.09(X) 8.33(A) 2.50(N)  Sodium 135 - 145 mmol/L 145 143 139  Potassium 3.5 - 5.1 mmol/L 4.0 3.9 4.2  Chloride 98 - 111 mmol/L 109 109 106  CO2 22 - 32 mmol/L 21(L) 19(L) 21(L)  Calcium 8.9 -  10.3 mg/dL 8.3(L) 7.8(L) 8.0(L)     BP (!) 143/88   Pulse 98   Temp 97.7 F (36.5 C)   Resp 14   Ht 5\' 4"  (1.626 m)   Wt 75 kg   SpO2 94%   BMI 28.38 kg/m    I/O last 3 completed shifts: In: 3693.5 [I.V.:1883.5; Other:100; NG/GT:1710] Out: 2280 [Urine:1980; Stool:300] Total I/O In: 1236.8 [I.V.:566.8; Other:100; NG/GT:570] Out: 1545 [Urine:1345; Stool:200]  SpO2: 94 % O2 Flow Rate (L/min): 15 L/min FiO2 (%): 30 %  Estimated body mass index is 28.38 kg/m as calculated from the following:   Height as of this encounter: 5\' 4"  (1.626 m).   Weight as of this encounter: 75 kg.  SIGNIFICANT EVENTS   REVIEW OF SYSTEMS  PATIENT IS UNABLE TO PROVIDE COMPLETE REVIEW OF SYSTEMS DUE TO SEVERE CRITICAL ILLNESS    PHYSICAL EXAMINATION: Gen:      No acute distress HEENT:  EOMI, sclera anicteric Neck:     No masses; no thyromegaly, ET tube Lungs:    Clear to auscultation bilaterally; normal respiratory effort CV:         Regular rate and rhythm; no murmurs Abd:      + bowel sounds; soft, non-tender; no palpable masses, no distension Ext:    No edema; adequate peripheral perfusion Skin:  Warm and dry; no rash Neuro: Sedated, unresponsive    MEDICATIONS: I have reviewed all medications and confirmed regimen as documented   CULTURE RESULTS   Recent Results (from the past 240 hour(s))  Blood Culture (routine x 2)     Status: Abnormal   Collection Time: October 20, 2020  7:39 PM   Specimen: BLOOD  Result Value Ref Range Status   Specimen Description   Final    BLOOD LEFT ANTECUBITAL Performed at [facility], [address] [place], [place] [address]    Special Requests   Final    BOTTLES DRAWN AEROBIC AND ANAEROBIC Blood Culture adequate volume Performed at [facility], [address] [place], [place] [address]    Culture  Setup Time   Final    Organism ID to follow GRAM POSITIVE COCCI AEROBIC BOTTLE ONLY CRITICAL RESULT CALLED TO, READ BACK BY  AND VERIFIED WITH: [facility] MERRILL AT 1339 09/25/20 SDR Performed at [facility], [address] [place], [place] [address]    Culture (A)  Final    STAPHYLOCOCCUS HOMINIS THE SIGNIFICANCE OF ISOLATING THIS ORGANISM FROM A SINGLE SET OF BLOOD CULTURES WHEN MULTIPLE SETS ARE DRAWN IS UNCERTAIN. PLEASE NOTIFY THE MICROBIOLOGY DEPARTMENT WITHIN ONE WEEK IF SPECIATION AND SENSITIVITIES ARE REQUIRED. Performed at [facility], [address] [place], [place] [address]    Report Status 09/27/2020 FINAL  Final  Blood Culture ID Panel (Reflexed)     Status: Abnormal   Collection Time: 20-Oct-2020  7:39 PM  Result Value Ref Range Status   Enterococcus faecalis NOT DETECTED NOT DETECTED Final   Enterococcus Faecium NOT DETECTED NOT DETECTED Final   Listeria monocytogenes NOT DETECTED NOT DETECTED Final   Staphylococcus species DETECTED (A) NOT DETECTED Final    Comment: CRITICAL RESULT CALLED TO, READ BACK BY AND VERIFIED WITH:  KRISTIN  MERRILL AT 1339 09/25/20 SDR    Staphylococcus aureus (BCID) NOT DETECTED NOT DETECTED Final   Staphylococcus epidermidis NOT DETECTED NOT DETECTED Final   Staphylococcus lugdunensis NOT DETECTED NOT DETECTED Final   Streptococcus species NOT DETECTED NOT DETECTED Final   Streptococcus agalactiae NOT DETECTED NOT DETECTED Final   Streptococcus pneumoniae NOT DETECTED NOT DETECTED Final   Streptococcus pyogenes NOT DETECTED NOT DETECTED Final   A.calcoaceticus-baumannii NOT DETECTED NOT DETECTED Final   Bacteroides fragilis NOT DETECTED NOT DETECTED Final   Enterobacterales NOT DETECTED NOT DETECTED Final   Enterobacter cloacae complex NOT DETECTED NOT DETECTED Final   Escherichia coli NOT DETECTED NOT DETECTED Final   Klebsiella aerogenes NOT DETECTED NOT DETECTED Final   Klebsiella oxytoca NOT DETECTED NOT DETECTED Final   Klebsiella pneumoniae NOT DETECTED NOT DETECTED Final   Proteus species NOT DETECTED NOT DETECTED Final   Salmonella  species NOT DETECTED NOT DETECTED Final   Serratia marcescens NOT DETECTED NOT DETECTED Final   Haemophilus influenzae NOT DETECTED NOT DETECTED Final   Neisseria meningitidis NOT DETECTED NOT DETECTED Final   Pseudomonas aeruginosa NOT DETECTED NOT DETECTED Final   Stenotrophomonas maltophilia NOT DETECTED NOT DETECTED Final   Candida albicans NOT DETECTED NOT DETECTED Final   Candida auris NOT DETECTED NOT DETECTED Final   Candida glabrata NOT DETECTED NOT DETECTED Final   Candida krusei NOT DETECTED NOT DETECTED Final   Candida parapsilosis NOT DETECTED NOT DETECTED Final   Candida tropicalis NOT DETECTED NOT DETECTED Final   Cryptococcus neoformans/gattii NOT DETECTED NOT DETECTED Final    Comment: Performed at [facility], [address] [place], [place] [address]  Resp Panel by RT-PCR (Flu A&B, Covid) Nasopharyngeal Swab     Status: Abnormal   Collection Time: 30-Jan-2020  7:40 PM   Specimen: Nasopharyngeal Swab; Nasopharyngeal(NP) swabs in vial transport medium  Result Value Ref Range Status   SARS Coronavirus 2 by RT PCR POSITIVE (A) NEGATIVE Final    Comment: RESULT CALLED TO, READ BACK BY AND VERIFIED WITH: KENDELL SHEETS @2138  ON 30-Jan-2020 SKL (NOTE) SARS-CoV-2 target nucleic acids are DETECTED.  The SARS-CoV-2 RNA is generally detectable in upper respiratory specimens during the acute phase of infection. Positive results are indicative of the presence of the identified virus, but do not rule out bacterial infection or co-infection with other pathogens not detected by the test. Clinical correlation with patient history and other diagnostic information is necessary to determine patient infection status. The expected result is Negative.  Fact Sheet for Patients: BloggerCourse.comhttps://www.fda.gov/media/152166/download  Fact Sheet for Healthcare Providers: SeriousBroker.ithttps://www.fda.gov/media/152162/download  This test is not yet approved or cleared by the [place] FDA and  has been  authorized for detection and/or diagnosis of SARS-CoV-2 by FDA under an Emergency Use Authorization (EUA).  This EUA will remain in effect (meaning this test can  be used) for the duration of  the COVID-19 declaration under Section 564(b)(1) of the Act, 21 U.S.C. section 360bbb-3(b)(1), unless the authorization is terminated or revoked sooner.     Influenza A by PCR NEGATIVE NEGATIVE Final   Influenza B by PCR NEGATIVE NEGATIVE Final    Comment: (NOTE) The Xpert Xpress SARS-CoV-2/FLU/RSV plus assay is intended as an aid in the diagnosis of influenza from Nasopharyngeal swab specimens and should not be used as a sole basis for treatment. Nasal washings and aspirates are unacceptable for Xpert Xpress SARS-CoV-2/FLU/RSV testing.  Fact Sheet for Patients: BloggerCourse.comhttps://www.fda.gov/media/152166/download  Fact Sheet for Healthcare Providers: SeriousBroker.ithttps://www.fda.gov/media/152162/download  This test is not yet approved or cleared by the [place] FDA and has been authorized for detection and/or diagnosis of SARS-CoV-2 by FDA under an Emergency Use Authorization (EUA). This EUA will remain in effect (meaning this test can be used) for the duration of the COVID-19 declaration under Section 564(b)(1) of the Act, 21 U.S.C. section 360bbb-3(b)(1), unless the authorization is terminated or revoked.  Performed at [facility], [address] [place], [place] [address]   Blood Culture (routine x 2)     Status: None   Collection Time: 30-Jan-2020  7:44 PM   Specimen: BLOOD  Result Value Ref Range Status   Specimen Description BLOOD BLOOD LEFT HAND  Final   Special Requests   Final    BOTTLES DRAWN AEROBIC AND ANAEROBIC Blood Culture adequate volume   Culture   Final    NO GROWTH 5 DAYS Performed at [facility], [address] [place], [place] [address]    Report Status 09/29/2020 FINAL  Final  Urine culture     Status: None   Collection Time: 09/25/20  4:07 AM   Specimen:  In/Out Cath Urine  Result Value Ref Range Status   Specimen Description   Final    IN/OUT CATH URINE Performed at [facility], [address] [place], [place] [address]    Special Requests   Final    NONE Performed at [facility], [address] [place], [place] [address]    Culture   Final    NO GROWTH Performed at [facility], [address] [place], [place] [address]    Report  Status 09/26/2020 FINAL  Final  MRSA PCR Screening     Status: None   Collection Time: 09/25/20  4:07 AM   Specimen: Nasal Mucosa; Nasopharyngeal  Result Value Ref Range Status   MRSA by PCR NEGATIVE NEGATIVE Final    Comment:        The GeneXpert MRSA Assay (FDA approved for NASAL specimens only), is one component of a comprehensive MRSA colonization surveillance program. It is not intended to diagnose MRSA infection nor to guide or monitor treatment for MRSA infections. Performed at [facility], [address] [place], [place] [address]           IMAGING    No results found.   Nutrition Status: Nutrition Problem: Inadequate oral intake Etiology: inability to eat Signs/Symptoms: NPO status Interventions: Tube feeding,Prostat,MVI     Indwelling Urinary Catheter continued, requirement due to   Reason to continue Indwelling Urinary Catheter strict Intake/Output monitoring for hemodynamic instability   Central Line/ continued, requirement due to  Reason to continue [employer] Monitoring of central venous pressure or other hemodynamic parameters and poor IV access   Ventilator continued, requirement due to severe respiratory failure   Ventilator Sedation RASS 0 to -2      ASSESSMENT AND PLAN SYNOPSIS 58 year old with ARDS, COVID-19 pneumonia, bilateral DVT and small right upper lobe PE   Acute respiratory failure.   Acute COVID19 pneumonia -Remdesevir antiviral - pharmacy protocol 5 d -vitamin C -zinc -decadron 6mg  IV daily    - utilize external urinary catheter if possible -Self prone if patient can tolerate   for bronchopulmonary hygiene when able -d/c hepatotoxic medications while on remdesevir -supportive care with ICU telemetry monitoring -PT/OT when possible -procalcitonin, CRP and ferritin trending Use intermittent paralytics for vent dyssynchrony. Repeat ABG.  Prone if P/F ratio less than 150 Goal plateau pressure less than 30, goal driving pressure less than 15 Continue remdesivir, steroids Continue baricitinib   NEUROLOGY Acute toxic metabolic encephalopathy, need for sedation Continue fentanyl, propofol. Increase RASS goal to -2-3 as she is dyssynchronous with the vent Intermittent paralytics  SHOCK-SEPSIS Off pressors   CARDIAC ICU monitoring  ID Received ceftriaxone, azithromycin from 12/16- 12/17 1/2 blood cultures are growing staph hominis.  This is likely a contaminant DC vancomycin as kidney function is getting worse  GI GI PROPHYLAXIS as indicated   DIET-->TF's as tolerated Constipation protocol as indicated   ENDO - will use ICU hypoglycemic\Hyperglycemia protocol if indicated Continue Levemir, SSI   DVT/PE Continue heparin drip  RENAL AKI Creatinine continues to increase. Nephrology consult.  Likely headed towards dialysis but no acute need to start today  ELECTROLYTES -follow labs as needed -replace as needed -pharmacy consultation and following  GOALS OF CARE Updated friend 09-16-1990 and sister Kennon Rounds on 12/19 over telephone.  No answer when I called brother Duane Discussed worsening clinical status, possible need for dialysis  Decision made to change status to limited code with no CPR or shocks in case of cardiac arrest. We will try dialysis on a time-limited basis if needed but no prolonged mechanical or dialysis support.   DVT/GI PRX ordered and assessed TRANSFUSIONS AS NEEDED MONITOR FSBS I Assessed the need for Labs I Assessed the need for  Foley I Assessed the need for Central Venous Line Family Discussion when available I Assessed the need for Mobilization I made an Assessment of medications to be adjusted accordingly Safety Risk assessment completed   CASE DISCUSSED IN MULTIDISCIPLINARY ROUNDS WITH ICU TEAM  The  patient is critically ill with multiple organ system failure and requires high complexity decision making for assessment and support, frequent evaluation and titration of therapies, advanced monitoring, review of radiographic studies and interpretation of complex data.   Critical Care Time devoted to patient care services, exclusive of separately billable procedures, described in this note is 33 minutes.    Vida Rigger, M.D.  Pulmonary & Critical Care Medicine  [facility] \

## 2020-09-30 NOTE — Progress Notes (Signed)
[name], [place] 09/30/20  Subjective:   Hospital day # 6  cvs:  no pressors Pulm:vent assisted; Fio2 30%;  Gi: TF ongoing 45 cc/hr Propofol Fentanyl Heparin  Renal: 12/21 0701 - 12/22 0700 In: 2501.2 [I.V.:1321.2; NG/GT:1080] Out: 1960 [Urine:1660; Stool:300] Lab Results  Component Value Date   CREATININE 4.08 (H) 09/30/2020   CREATININE 4.06 (H) 09/29/2020   CREATININE 3.31 (H) 09/28/2020   Critically ill appearing  Objective:  Vital signs in last 24 hours:  Temp:  [93.74 F (34.3 C)-97.52 F (36.4 C)] 97.16 F (36.2 C) (12/22 0700) Pulse Rate:  [81-105] 100 (12/22 0700) Resp:  [18-33] 18 (12/22 0700) BP: (115-174)/(69-100) 164/87 (12/22 0700) SpO2:  [96 %-100 %] 99 % (12/22 0746) FiO2 (%):  [30 %] 30 % (12/22 0746) Weight:  [75 kg] 75 kg (12/22 0419)  Weight change: 1.7 kg Filed Weights   09/28/20 0431 09/29/20 0425 09/30/20 0419  Weight: 68.9 kg 73.3 kg 75 kg    Intake/Output:    Intake/Output Summary (Last 24 hours) at 09/30/2020 0859 Last data filed at 09/30/2020 0600 Gross per 24 hour  Intake 2501.23 ml  Output 1960 ml  Net 541.23 ml    Physical Exam: General:  No acute distress, laying in the bed, critically ill  HEENT  anicteric, ETT in place  Pulm/lungs  vent assisted  CVS/Heart  regular rhythm, tachycardic  Abdomen:   Soft,   Extremities:  trace peripheral edema  Neurologic:  sedated  Skin:  No acute rashes  Foley in place    Basic Metabolic Panel:  Recent Labs  Lab 09/25/20 0621 09/26/20 0426 09/27/20 0511 09/28/20 0322 09/29/20 0621 09/30/20 0720  NA 138 138 136 139 143 145  K 4.7 3.7 3.7 4.2 3.9 4.0  CL 104 105 105 106 109 109  CO2 20* 21* 20* 21* 19* 21*  GLUCOSE 213* 169* 160* 186* 161* 152*  BUN 21* 46* 79* 86* 100* 105*  CREATININE 1.14* 2.73* 3.26* 3.31* 4.06* 4.08*  CALCIUM 7.8* 7.7* 7.8* 8.0* 7.8* 8.3*  MG 2.1 2.2 2.4 2.7* 2.8*  --   PHOS 6.4* 3.8 5.1* 8.8* 7.1* 6.4*      CBC: Recent Labs  Lab 09/12/2020 1942 09/25/20 0621 09/26/20 0426 09/28/20 0322 09/29/20 0621 09/30/20 0801  WBC 9.6 15.6* 11.1* 18.9* 10.3 13.0*  NEUTROABS 8.4* 13.7* 9.0*  --   --   --   HGB 13.9 13.3 10.6* 11.9* 10.7* 10.6*  HCT 42.1 41.6 32.8* 37.6 32.7* 31.1*  MCV 85.6 90.6 88.4 90.2 89.1 86.4  PLT 323 270 276 427* 331 330      Lab Results  Component Value Date   HEPBSAG NON REACTIVE 09/27/2020      Microbiology:  Recent Results (from the past 240 hour(s))  Blood Culture (routine x 2)     Status: Abnormal   Collection Time: 09/23/2020  7:39 PM   Specimen: BLOOD  Result Value Ref Range Status   Specimen Description   Final    BLOOD LEFT ANTECUBITAL Performed at [facility], [address] [place], [place] [address]    Special Requests   Final    BOTTLES DRAWN AEROBIC AND ANAEROBIC Blood Culture adequate volume Performed at [facility], [address] [place], [place] [address]    Culture  Setup Time   Final    Organism ID to follow GRAM POSITIVE COCCI AEROBIC BOTTLE ONLY CRITICAL RESULT CALLED TO, READ BACK BY AND VERIFIED WITH: KRISTIN MERRILL  AT [id number] 09/25/20 SDR Performed at [facility], [address] [place], [place] [address]    Culture (A)  Final    STAPHYLOCOCCUS HOMINIS THE SIGNIFICANCE OF ISOLATING THIS ORGANISM FROM A SINGLE SET OF BLOOD CULTURES WHEN MULTIPLE SETS ARE DRAWN IS UNCERTAIN. PLEASE NOTIFY THE MICROBIOLOGY DEPARTMENT WITHIN ONE WEEK IF SPECIATION AND SENSITIVITIES ARE REQUIRED. Performed at [facility], [place] [address] [place], [place] [address]    Report Status 09/27/2020 FINAL  Final  Blood Culture ID Panel (Reflexed)     Status: Abnormal   Collection Time: 09/30/2020  7:39 PM  Result Value Ref Range Status   Enterococcus faecalis NOT DETECTED NOT DETECTED Final   Enterococcus Faecium NOT DETECTED NOT DETECTED Final   Listeria monocytogenes NOT DETECTED NOT DETECTED Final    Staphylococcus species DETECTED (A) NOT DETECTED Final    Comment: CRITICAL RESULT CALLED TO, READ BACK BY AND VERIFIED WITH:  KRISTIN  MERRILL AT [id number] 09/25/20 SDR    Staphylococcus aureus (BCID) NOT DETECTED NOT DETECTED Final   Staphylococcus epidermidis NOT DETECTED NOT DETECTED Final   Staphylococcus lugdunensis NOT DETECTED NOT DETECTED Final   Streptococcus species NOT DETECTED NOT DETECTED Final   Streptococcus agalactiae NOT DETECTED NOT DETECTED Final   Streptococcus pneumoniae NOT DETECTED NOT DETECTED Final   Streptococcus pyogenes NOT DETECTED NOT DETECTED Final   A.calcoaceticus-baumannii NOT DETECTED NOT DETECTED Final   Bacteroides fragilis NOT DETECTED NOT DETECTED Final   Enterobacterales NOT DETECTED NOT DETECTED Final   Enterobacter cloacae complex NOT DETECTED NOT DETECTED Final   Escherichia coli NOT DETECTED NOT DETECTED Final   Klebsiella aerogenes NOT DETECTED NOT DETECTED Final   Klebsiella oxytoca NOT DETECTED NOT DETECTED Final   Klebsiella pneumoniae NOT DETECTED NOT DETECTED Final   Proteus species NOT DETECTED NOT DETECTED Final   Salmonella species NOT DETECTED NOT DETECTED Final   Serratia marcescens NOT DETECTED NOT DETECTED Final   Haemophilus influenzae NOT DETECTED NOT DETECTED Final   Neisseria meningitidis NOT DETECTED NOT DETECTED Final   Pseudomonas aeruginosa NOT DETECTED NOT DETECTED Final   Stenotrophomonas maltophilia NOT DETECTED NOT DETECTED Final   Candida albicans NOT DETECTED NOT DETECTED Final   Candida auris NOT DETECTED NOT DETECTED Final   Candida glabrata NOT DETECTED NOT DETECTED Final   Candida krusei NOT DETECTED NOT DETECTED Final   Candida parapsilosis NOT DETECTED NOT DETECTED Final   Candida tropicalis NOT DETECTED NOT DETECTED Final   Cryptococcus neoformans/gattii NOT DETECTED NOT DETECTED Final    Comment: Performed at [facility], [address] [place],  [address]  Resp Panel by RT-PCR (Flu A&B,  Covid) Nasopharyngeal Swab     Status: Abnormal   Collection Time: 10/03/2020  7:40 PM   Specimen: Nasopharyngeal Swab; Nasopharyngeal(NP) swabs in vial transport medium  Result Value Ref Range Status   SARS Coronavirus 2 by RT PCR POSITIVE (A) NEGATIVE Final    Comment: RESULT CALLED TO, READ BACK BY AND VERIFIED WITH: KENDELL SHEETS '@[id number]'  ON 09/09/2020 SKL (NOTE) SARS-CoV-2 target nucleic acids are DETECTED.  The SARS-CoV-2 RNA is generally detectable in upper respiratory specimens during the acute phase of infection. Positive results are indicative of the presence of the identified virus, but do not rule out bacterial infection or co-infection with other pathogens not detected by the test. Clinical correlation with patient history and other diagnostic information is necessary to determine patient infection status. The expected result is Negative.  Fact Sheet for Patients: EntrepreneurPulse.com.au  Fact Sheet for Healthcare Providers: IncredibleEmployment.be  This test is not yet approved or cleared by the [place] FDA and  has been authorized for detection and/or diagnosis of SARS-CoV-2 by FDA under an Emergency Use Authorization (EUA).  This EUA will remain in effect (meaning this test can  be used) for the duration of  the COVID-19 declaration under Section 564(b)(1) of the Act, 21 U.S.C. section 360bbb-3(b)(1), unless the authorization is terminated or revoked sooner.     Influenza A by PCR NEGATIVE NEGATIVE Final   Influenza B by PCR NEGATIVE NEGATIVE Final    Comment: (NOTE) The Xpert Xpress SARS-CoV-2/FLU/RSV plus assay is intended as an aid in the diagnosis of influenza from Nasopharyngeal swab specimens and should not be used as a sole basis for treatment. Nasal washings and aspirates are unacceptable for Xpert Xpress SARS-CoV-2/FLU/RSV testing.  Fact Sheet for Patients: EntrepreneurPulse.com.au  Fact Sheet  for Healthcare Providers: IncredibleEmployment.be  This test is not yet approved or cleared by the [place] FDA and has been authorized for detection and/or diagnosis of SARS-CoV-2 by FDA under an Emergency Use Authorization (EUA). This EUA will remain in effect (meaning this test can be used) for the duration of the COVID-19 declaration under Section 564(b)(1) of the Act, 21 U.S.C. section 360bbb-3(b)(1), unless the authorization is terminated or revoked.  Performed at [facility], [address] [place], [place] [address]   Blood Culture (routine x 2)     Status: None   Collection Time: 09/10/2020  7:44 PM   Specimen: BLOOD  Result Value Ref Range Status   Specimen Description BLOOD BLOOD LEFT HAND  Final   Special Requests   Final    BOTTLES DRAWN AEROBIC AND ANAEROBIC Blood Culture adequate volume   Culture   Final    NO GROWTH 5 DAYS Performed at [facility], [address] [place], [place] [address]    Report Status 09/29/2020 FINAL  Final  Urine culture     Status: None   Collection Time: 09/25/20  4:07 AM   Specimen: In/Out Cath Urine  Result Value Ref Range Status   Specimen Description   Final    IN/OUT CATH URINE Performed at [facility], [address], [place], [place] [address]    Special Requests   Final    NONE Performed at [facility], [address] [place], [place] [address]    Culture   Final    NO GROWTH Performed at [facility], [place] [address], [place], [place] [address]    Report Status 09/26/2020 FINAL  Final  MRSA PCR Screening     Status: None   Collection Time: 09/25/20  4:07 AM   Specimen: Nasal Mucosa; Nasopharyngeal  Result Value Ref Range Status   MRSA by PCR NEGATIVE NEGATIVE Final    Comment:        The GeneXpert MRSA Assay (FDA approved for NASAL specimens only), is one component of a comprehensive MRSA colonization surveillance program. It is not intended  to diagnose MRSA infection nor to guide or monitor treatment for MRSA infections. Performed at [facility], [address] [place], [place] [address]     Coagulation Studies: No results for input(s): LABPROT, INR in the last 72 hours.  Urinalysis: No results for input(s): COLORURINE, LABSPEC, PHURINE, GLUCOSEU, HGBUR, BILIRUBINUR, KETONESUR, PROTEINUR, UROBILINOGEN, NITRITE, LEUKOCYTESUR in the last 72 hours.  Invalid input(s): APPERANCEUR    Imaging: DG Chest Port 1 View  Result  Date: 09/29/2020 CLINICAL DATA:  Hypoxia. Central catheter placement. COVID-19 positive. EXAM: PORTABLE CHEST 1 VIEW COMPARISON:  September 28, 2020 FINDINGS: Central catheter tip is in the superior vena cava. Endotracheal tube tip is 4.7 cm above the carina. Nasogastric tube tip and side port are in the stomach. No pneumothorax. There is mild atelectasis in the right mid lung. Lungs elsewhere clear. Heart size and pulmonary vascularity are normal. Healed fractures of the left sixth and seventh ribs noted. IMPRESSION: Tube and catheter positions as described without pneumothorax. Slight right midlung atelectasis. Lungs elsewhere clear. Cardiac silhouette normal. Electronically Signed   By: Lowella Grip III M.D.   On: 09/29/2020 13:46     Medications:   . sodium chloride 150 mL/hr at 09/26/20 2000  . feeding supplement (VITAL AF 1.2 CAL) 45 mL/hr at 09/30/20 0600  . fentaNYL infusion INTRAVENOUS 200 mcg/hr (09/30/20 0600)  . heparin 750 Units/hr (09/30/20 0600)  . propofol (DIPRIVAN) infusion 70 mcg/kg/min (09/30/20 0600)   . albuterol  2 puff Inhalation Q6H  . vitamin C  500 mg Per Tube Daily  . chlorhexidine gluconate (MEDLINE KIT)  15 mL Mouth Rinse BID  . Chlorhexidine Gluconate Cloth  6 each Topical Daily  . docusate  100 mg Per Tube BID  . feeding supplement (PROSource TF)  45 mL Per Tube BID  . fentaNYL (SUBLIMAZE) injection  50 mcg Intravenous Once  . folic acid  1 mg Per Tube  Daily  . insulin aspart  0-15 Units Subcutaneous Q4H  . insulin detemir  5 Units Subcutaneous BID  . mouth rinse  15 mL Mouth Rinse 10 times per day  . multivitamin with minerals  1 tablet Per Tube Daily  . pantoprazole (PROTONIX) IV  40 mg Intravenous Q24H  . polyethylene glycol  17 g Per Tube Daily  . predniSONE  50 mg Per Tube Daily  . thiamine  100 mg Per Tube Daily  . zinc sulfate  220 mg Per Tube Daily   acetaminophen, fentaNYL, heparin, midazolam, midazolam, morphine injection, ondansetron **OR** ondansetron (ZOFRAN) IV  Assessment/ Plan:  59 y.o. female with  hypertension, anxiety, anemia who was brought to the ER on December 16 with a chief complaint of cough shortness of breath   admitted on 09/15/2020 for Edema [R60.9] Lactic acid acidosis [E87.2] Acute respiratory failure with hypoxia ([place]) [J96.01] Pneumonia due to COVID-19 virus [U07.1, J12.82]   # Acute Kidney injury with proteinuria AKI likely secondary to ATN and iv contrast exposure (12/172021) 12/21 0701 - 12/22 0700 In: 2501.2 [I.V.:1321.2; NG/GT:1080] Out: 1960 [Urine:1660; Stool:300]   Lab Results  Component Value Date   CREATININE 4.08 (H) 09/30/2020   CREATININE 4.06 (H) 09/29/2020   CREATININE 3.31 (H) 09/28/2020    S Creatinine appears to be peaking patient is non oliguric and urine output trend is improving resp acidosis has improved Dialysis catheter is in place Recommend trial of IV Lasix No acute indication for dialysis today.  We will continue to monitor closely on a daily basis  # Covid 19 pneumonia Complicated by Acute resp failure with hypoxia Small pulmonary embolism right upper lobe pulmonary arteries in a distal pulmonary artery- requiring iv anticoagulation  # hyperphosphatemia Lab Results  Component Value Date   PTH 109 (H) 09/29/2020   CALCIUM 8.3 (L) 09/30/2020   PHOS 6.4 (H) 09/30/2020    CK normal Phos expected to improve with improving UOP or dialysis     LOS:  6 Margaret Shepard 12/22/20218:59 AM  [facility]  [place], [place]  Note: This note was prepared with Dragon dictation. Any transcription errors are unintentional

## 2020-09-30 NOTE — Consult Note (Signed)
ANTICOAGULATION CONSULT NOTE   Pharmacy Consult for Heparin Indication: DVT/PE  Allergies  Allergen Reactions  . Hydrocodone-Acetaminophen Nausea And Vomiting and Itching    Patient Measurements: Height: 5\' 4"  (162.6 cm) Weight: 75 kg (165 lb 5.5 oz) IBW/kg (Calculated) : 54.7 Heparin Dosing Weight: 63.5 kg  Vital Signs: Temp: 97.7 F (36.5 C) (12/22 1700) BP: 143/88 (12/22 1700) Pulse Rate: 98 (12/22 1700)  Labs: Recent Labs    09/28/20 0322 09/28/20 0622 09/29/20 0621 09/29/20 2336 09/30/20 0720 09/30/20 0801 09/30/20 1643  HGB 11.9*  --  10.7*  --   --  10.6*  --   HCT 37.6  --  32.7*  --   --  31.1*  --   PLT 427*  --  331  --   --  330  --   HEPARINUNFRC 1.84*   < > 0.36 0.23*  --  0.25* 0.37  CREATININE 3.31*  --  4.06*  --  4.08*  --   --   CKTOTAL  --   --  40  --   --   --   --    < > = values in this interval not displayed.    Estimated Creatinine Clearance: 14.5 mL/min (A) (by C-G formula based on SCr of 4.08 mg/dL (H)).   Medical History: Past Medical History:  Diagnosis Date  . Anemia    noted during hip surgery   . Anxiety    states no longer issue  . Frequent headaches   . Hypertension   . Lumbago   . 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 Patient was admitted for acute hypoxic respiratory failure 2/2 PNA due to COVID-19. Pharmacy consulted for heparin dosing and monitoring for ACS and pulmonary embolism. CT chest with small pulmonary embolism right upper lobe. Trop elevated 260. CBC stable. No DOAC PTA.   Goal of Therapy:  Heparin level 0.3-0.7 units/ml Monitor platelets by anticoagulation protocol: Yes   12/17 0621 HL = 0.2, bolus 1000 units, increased rate to 900 units/hr  12/17 1802 HL = 0.26, bolus 900 units, increased rate to 1000 units/hr 12/18 0057 HL = 0.29, bolus 900 units, increased  rate to 1100 units/hr 12/18 0814 HL = 0.42 x1, rate continued at 1100 units/hr 12/18 1500 HL = 0.40 x2 12/19 0511 HL = 0.58, therapeutic X 3  12/20 0322 HL = 1.84 , supratherapeutic -1100 12/20 0610 HL = 2.0     supratherapeutic-850 12/21 0621 HL = 0.36 therapeutic x 1 12/21 2336 HL = 0.23, SUBtherapeutic 12/22 0801 HL = 0.25, subtherapeutic 12/22 1643 HL = 0.37, therapeutic x 1   Plan:  12/22 1643 HL 0.37 is therapeutic. Continue heparin drip at 850 units/hr. Recheck HL at 2300 to confirm. CBC daily while on heparin drip.  1/23, PharmD, BCPS 09/30/2020 5:56 PM k

## 2020-10-01 LAB — RENAL FUNCTION PANEL
Albumin: 2.2 g/dL — ABNORMAL LOW (ref 3.5–5.0)
Anion gap: 15 (ref 5–15)
BUN: 116 mg/dL — ABNORMAL HIGH (ref 6–20)
CO2: 22 mmol/L (ref 22–32)
Calcium: 8.5 mg/dL — ABNORMAL LOW (ref 8.9–10.3)
Chloride: 112 mmol/L — ABNORMAL HIGH (ref 98–111)
Creatinine, Ser: 3.9 mg/dL — ABNORMAL HIGH (ref 0.44–1.00)
GFR, Estimated: 13 mL/min — ABNORMAL LOW (ref >60)
Glucose, Bld: 126 mg/dL — ABNORMAL HIGH (ref 70–99)
Phosphorus: 6.7 mg/dL — ABNORMAL HIGH (ref 2.5–4.6)
Potassium: 4.4 mmol/L (ref 3.5–5.1)
Sodium: 149 mmol/L — ABNORMAL HIGH (ref 135–145)

## 2020-10-01 LAB — BLOOD GAS, ARTERIAL
Acid-base deficit: 3 mmol/L — ABNORMAL HIGH (ref 0.0–2.0)
Bicarbonate: 22.7 mmol/L (ref 20.0–28.0)
FIO2: 0.35
Mode: POSITIVE
O2 Saturation: 96.4 %
PEEP: 6 cmH2O
Patient temperature: 37
Pressure support: 8 cmH2O
pCO2 arterial: 42 mmHg (ref 32.0–48.0)
pH, Arterial: 7.34 — ABNORMAL LOW (ref 7.350–7.450)
pO2, Arterial: 90 mmHg (ref 83.0–108.0)

## 2020-10-01 LAB — HEPARIN LEVEL (UNFRACTIONATED)
Heparin Unfractionated: 0.1 IU/mL — ABNORMAL LOW (ref 0.30–0.70)
Heparin Unfractionated: 0.31 IU/mL (ref 0.30–0.70)
Heparin Unfractionated: 0.59 IU/mL (ref 0.30–0.70)

## 2020-10-01 LAB — GLUCOSE, CAPILLARY
Glucose-Capillary: 117 mg/dL — ABNORMAL HIGH (ref 70–99)
Glucose-Capillary: 127 mg/dL — ABNORMAL HIGH (ref 70–99)
Glucose-Capillary: 135 mg/dL — ABNORMAL HIGH (ref 70–99)
Glucose-Capillary: 235 mg/dL — ABNORMAL HIGH (ref 70–99)
Glucose-Capillary: 189 mg/dL — ABNORMAL HIGH (ref 70–99)
Glucose-Capillary: 138 mg/dL — ABNORMAL HIGH (ref 70–99)

## 2020-10-01 LAB — BASIC METABOLIC PANEL
Anion gap: 14 (ref 5–15)
BUN: 113 mg/dL — ABNORMAL HIGH (ref 6–20)
CO2: 22 mmol/L (ref 22–32)
Calcium: 8.7 mg/dL — ABNORMAL LOW (ref 8.9–10.3)
Chloride: 109 mmol/L (ref 98–111)
Creatinine, Ser: 3.89 mg/dL — ABNORMAL HIGH (ref 0.44–1.00)
GFR, Estimated: 13 mL/min — ABNORMAL LOW (ref >60)
Glucose, Bld: 257 mg/dL — ABNORMAL HIGH (ref 70–99)
Potassium: 4.7 mmol/L (ref 3.5–5.1)
Sodium: 145 mmol/L (ref 135–145)

## 2020-10-01 LAB — TRIGLYCERIDES: Triglycerides: 241 mg/dL — ABNORMAL HIGH (ref <150)

## 2020-10-01 LAB — CBC
HCT: 29.7 % — ABNORMAL LOW (ref 36.0–46.0)
Hemoglobin: 10 g/dL — ABNORMAL LOW (ref 12.0–15.0)
MCH: 29 pg (ref 26.0–34.0)
MCHC: 33.7 g/dL (ref 30.0–36.0)
MCV: 86.1 fL (ref 80.0–100.0)
Platelets: 307 10*3/uL (ref 150–400)
RBC: 3.45 MIL/uL — ABNORMAL LOW (ref 3.87–5.11)
RDW: 15.5 % (ref 11.5–15.5)
WBC: 9.7 10*3/uL (ref 4.0–10.5)
nRBC: 0 % (ref 0.0–0.2)

## 2020-10-01 LAB — MAGNESIUM
Magnesium: 2.7 mg/dL — ABNORMAL HIGH (ref 1.7–2.4)
Magnesium: 2.6 mg/dL — ABNORMAL HIGH (ref 1.7–2.4)

## 2020-10-01 LAB — C-REACTIVE PROTEIN: CRP: 14.8 mg/dL — ABNORMAL HIGH (ref <1.0)

## 2020-10-01 MED ORDER — HEPARIN BOLUS VIA INFUSION
1900.0000 [IU] | Freq: Once | INTRAVENOUS | Status: AC
  Administered 2020-10-01: 09:00:00 1900 [IU] via INTRAVENOUS
  Filled 2020-10-01: qty 1900

## 2020-10-01 MED ORDER — FUROSEMIDE 10 MG/ML IJ SOLN
40.0000 mg | Freq: Every day | INTRAMUSCULAR | Status: DC
  Administered 2020-10-01: 12:00:00 40 mg via INTRAVENOUS
  Filled 2020-10-01: qty 4

## 2020-10-01 MED ORDER — VECURONIUM BROMIDE 10 MG IV SOLR
INTRAVENOUS | Status: AC
  Administered 2020-10-01: 05:00:00 10 mg
  Filled 2020-10-01: qty 10

## 2020-10-01 MED ORDER — FUROSEMIDE 10 MG/ML IJ SOLN
40.0000 mg | Freq: Once | INTRAMUSCULAR | Status: AC
  Administered 2020-10-01: 40 mg via INTRAVENOUS
  Filled 2020-10-01: qty 4

## 2020-10-01 MED ORDER — VECURONIUM BROMIDE 10 MG IV SOLR: 10.0000 mg | Freq: Once | INTRAVENOUS | Status: AC

## 2020-10-01 MED ORDER — ALBUMIN HUMAN 25 % IV SOLN
25.0000 g | Freq: Once | INTRAVENOUS | Status: AC
  Administered 2020-10-01: 12:00:00 25 g via INTRAVENOUS
  Filled 2020-10-01: qty 100

## 2020-10-01 MED ORDER — DEXMEDETOMIDINE HCL IN NACL 400 MCG/100ML IV SOLN
0.4000 ug/kg/h | INTRAVENOUS | Status: DC
  Administered 2020-10-01: 19:00:00 0.9 ug/kg/h via INTRAVENOUS
  Administered 2020-10-01: 13:00:00 0.4 ug/kg/h via INTRAVENOUS
  Administered 2020-10-02 – 2020-10-04 (×13): 1.2 ug/kg/h via INTRAVENOUS
  Administered 2020-10-05: 20:00:00 1 ug/kg/h via INTRAVENOUS
  Administered 2020-10-05 (×2): 1.2 ug/kg/h via INTRAVENOUS
  Administered 2020-10-06: 21:00:00 1 ug/kg/h via INTRAVENOUS
  Administered 2020-10-06: 06:00:00 1.2 ug/kg/h via INTRAVENOUS
  Administered 2020-10-06: 01:00:00 1.1 ug/kg/h via INTRAVENOUS
  Administered 2020-10-06 (×2): 1.2 ug/kg/h via INTRAVENOUS
  Administered 2020-10-07: 15:00:00 1 ug/kg/h via INTRAVENOUS
  Administered 2020-10-07 (×2): 1.2 ug/kg/h via INTRAVENOUS
  Administered 2020-10-07: 21:00:00 1 ug/kg/h via INTRAVENOUS
  Administered 2020-10-07: 06:00:00 1.2 ug/kg/h via INTRAVENOUS
  Administered 2020-10-08: 02:00:00 1 ug/kg/h via INTRAVENOUS
  Administered 2020-10-08: 20:00:00 1.2 ug/kg/h via INTRAVENOUS
  Administered 2020-10-08: 06:00:00 1 ug/kg/h via INTRAVENOUS
  Administered 2020-10-08 – 2020-10-13 (×23): 1.2 ug/kg/h via INTRAVENOUS
  Filled 2020-10-01 (×25): qty 100
  Filled 2020-10-01: qty 200
  Filled 2020-10-01 (×30): qty 100

## 2020-10-01 NOTE — Consult Note (Signed)
ANTICOAGULATION CONSULT NOTE   Pharmacy Consult for Heparin Indication: DVT/PE  Patient Measurements: Heparin Dosing Weight: 63.5 kg  Labs: Recent Labs    09/29/20 0621 09/29/20 2336 09/30/20 0720 09/30/20 0801 09/30/20 1643 09/30/20 2337 10/01/20 0626  HGB 10.7*  --   --  10.6*  --   --  10.0*  HCT 32.7*  --   --  31.1*  --   --  29.7*  PLT 331  --   --  330  --   --  307  HEPARINUNFRC 0.36   < >  --  0.25* 0.37 0.31 0.10*  CREATININE 4.06*  --  4.08*  --   --   --  3.90*  CKTOTAL 40  --   --   --   --   --   --    < > = values in this interval not displayed.    Estimated Creatinine Clearance: 15.1 mL/min (A) (by C-G formula based on SCr of 3.9 mg/dL (H)).   Medical History: Past Medical History:  Diagnosis Date  . Anemia    noted during hip surgery   . Anxiety    states no longer issue  . Frequent headaches   . Hypertension   . Lumbago   . 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 Patient was admitted for acute hypoxic respiratory failure 2/2 PNA due to COVID-19. Pharmacy consulted for heparin dosing and monitoring for ACS and pulmonary embolism. CT chest with small pulmonary embolism right upper lobe. Trop elevated 260. CBC stable. No DOAC PTA.   Goal of Therapy:  Heparin level 0.3-0.7 units/ml Monitor platelets by anticoagulation protocol: Yes   12/17 0621 HL = 0.2, bolus 1000 units, increased rate to 900 units/hr  12/17 1802 HL = 0.26, bolus 900 units, increased rate to 1000 units/hr 12/18 0057 HL = 0.29, bolus 900 units, increased rate to 1100 units/hr 12/18 0814 HL = 0.42 x1, rate continued at 1100 units/hr 12/18 1500 HL = 0.40 x2 12/19 0511 HL = 0.58, therapeutic X 3  12/20 0322 HL = 1.84 , supratherapeutic -1100 12/20 0610 HL = 2.0     supratherapeutic-850 12/21 0621 HL = 0.36 therapeutic x 1 12/21 2336 HL =  0.23, subtherapeutic 12/22 0801 HL = 0.25, subtherapeutic 12/22 1643 HL = 0.37, therapeutic x 1 12/22 2339 HL = 0.31, therapeutic x 2 12/23 0626 HL = 0.10, subtherapeutic   Plan:  --Confirmed with RN, no issues with infusion --Heparin 1900 unit IV bolus x 1 and increase heparin drip rate to 1050 units/hr --Re-check HL 8 hours after rate change --Daily CBC per protocol while on heparin infusion  Tressie Ellis 10/01/2020 8:26 AM k

## 2020-10-01 NOTE — Progress Notes (Signed)
Patient weaned well on PSV. Changed back to full support. Dropped rate from 30 to 20. NP aware. Patient tolerating interventions well. Will continue to monitor.

## 2020-10-01 NOTE — Progress Notes (Signed)
CRITICAL CARE NOTE  59 yo female admitted with acute hypoxic respiratory failure secondary to pneumonia due to COVID-19 requiring HHFNC and NRB now intubated and severe resp failure complicated by DVT and PE  12/16 intubated 12/17 high risk for cardiac arrest, on pressors 12/18 Off pressors 12/19 worsening renal function.  Nephrology consult 09/28/20- patient was noted to be severely acidemic with HAGMA in am s/p 2 amp bicarb with repeat ABG significantly improved 12/21- patient will require HD, will place vascath today. Meds reviewed and changed, TG elevated on propofol. DCd IVF. 12/22-  Patient remains critically ill. Reviewed plan with nephrologist, patient will not have HD today. 10/01/20- patient on 35%FIo2 8peep, she diruesed >3L we will hold HD, continue diuresis. SBT.    CC  follow up respiratory failure  SUBJECTIVE FiO2 down to 35% Dyssynchronous with the ventilator Renal function is worse  Vent Mode: PRVC FiO2 (%):  [30 %-35 %] 35 % Set Rate:  [30 bmp] 30 bmp Vt Set:  [520 mL] 520 mL PEEP:  [8 cmH20-12 cmH20] 8 cmH20 Plateau Pressure:  [25 cmH20] 25 cmH20  CBC    Component Value Date/Time   WBC 9.7 10/01/2020 0626   RBC 3.45 (L) 10/01/2020 0626   HGB 10.0 (L) 10/01/2020 0626   HGB 13.6 07/08/2012 2019   HCT 29.7 (L) 10/01/2020 0626   HCT 39.4 07/08/2012 2019   PLT 307 10/01/2020 0626   PLT 202 07/08/2012 2019   MCV 86.1 10/01/2020 0626   MCV 91 07/08/2012 2019   MCH 29.0 10/01/2020 0626   MCHC 33.7 10/01/2020 0626   RDW 15.5 10/01/2020 0626   RDW 12.6 07/08/2012 2019   LYMPHSABS 1.3 09/26/2020 0426   MONOABS 0.7 09/26/2020 0426   EOSABS 0.0 09/26/2020 0426   BASOSABS 0.0 09/26/2020 0426   BMP Latest Ref Rng & Units 10/01/2020 09/30/2020 09/29/2020  Glucose 70 - 99 mg/dL 151(V) 616(W) 737(T)  BUN 6 - 20 mg/dL 062(I) 948(N) 462(V)  Creatinine 0.44 - 1.00 mg/dL 0.35(K) 0.93(G) 1.82(X)  Sodium 135 - 145 mmol/L 149(H) 145 143  Potassium 3.5 - 5.1 mmol/L  4.4 4.0 3.9  Chloride 98 - 111 mmol/L 112(H) 109 109  CO2 22 - 32 mmol/L 22 21(L) 19(L)  Calcium 8.9 - 10.3 mg/dL 9.3(Z) 8.3(L) 7.8(L)     BP 132/74   Pulse 83   Temp (!) 96.98 F (36.1 C)   Resp 18   Ht 5\' 4"  (1.626 m)   Wt 74.3 kg   SpO2 93%   BMI 28.12 kg/m    I/O last 3 completed shifts: In: 3964.9 [I.V.:2022.9; Other:100; NG/GT:1842] Out: 3970 [Urine:3570; Stool:400] Total I/O In: 0  Out: 75 [Urine:75]  SpO2: 93 % O2 Flow Rate (L/min): 15 L/min FiO2 (%): 35 %  Estimated body mass index is 28.12 kg/m as calculated from the following:   Height as of this encounter: 5\' 4"  (1.626 m).   Weight as of this encounter: 74.3 kg.  SIGNIFICANT EVENTS   REVIEW OF SYSTEMS  PATIENT IS UNABLE TO PROVIDE COMPLETE REVIEW OF SYSTEMS DUE TO SEVERE CRITICAL ILLNESS    PHYSICAL EXAMINATION: Gen:      No acute distress HEENT:  EOMI, sclera anicteric Neck:     No masses; no thyromegaly, ET tube Lungs:    Clear to auscultation bilaterally; normal respiratory effort CV:         Regular rate and rhythm; no murmurs Abd:      + bowel sounds; soft, non-tender; no palpable masses,  no distension Ext:    No edema; adequate peripheral perfusion Skin:      Warm and dry; no rash Neuro: Sedated, unresponsive    MEDICATIONS: I have reviewed all medications and confirmed regimen as documented   CULTURE RESULTS   Recent Results (from the past 240 hour(s))  Blood Culture (routine x 2)     Status: Abnormal   Collection Time: 09/17/2020  7:39 PM   Specimen: BLOOD  Result Value Ref Range Status   Specimen Description   Final    BLOOD LEFT ANTECUBITAL Performed at [facility], [address] [place], [place] [address]    Special Requests   Final    BOTTLES DRAWN AEROBIC AND ANAEROBIC Blood Culture adequate volume Performed at [facility], [address] [place], [place] [address]    Culture  Setup Time   Final    Organism ID to follow GRAM POSITIVE  CO[facility]I AEROBIC BOTTLE ONLY CRITICAL RESULT CALLED TO, READ BACK BY AND VERIFIED WITH: [facility] MERRILL AT 1339 09/25/20 SDR Performed at [facility], [address] [place], [place] [address]    Culture (A)  Final    STAPHYLOCOCCUS HOMINIS THE SIGNIFICANCE OF ISOLATING THIS ORGANISM FROM A SINGLE SET OF BLOOD CULTURES WHEN MULTIPLE SETS ARE DRAWN IS UNCERTAIN. PLEASE NOTIFY THE MICROBIOLOGY DEPARTMENT WITHIN ONE WEEK IF SPECIATION AND SENSITIVITIES ARE REQUIRED. Performed at [facility], [address] [place], [place] [address]    Report Status 09/27/2020 FINAL  Final  Blood Culture ID Panel (Reflexed)     Status: Abnormal   Collection Time: 09/16/2020  7:39 PM  Result Value Ref Range Status   Enterococcus faecalis NOT DETECTED NOT DETECTED Final   Enterococcus Faecium NOT DETECTED NOT DETECTED Final   Listeria monocytogenes NOT DETECTED NOT DETECTED Final   Staphylococcus species DETECTED (A) NOT DETECTED Final    Comment: CRITICAL RESULT CALLED TO, READ BACK BY AND VERIFIED WITH:  KRISTIN  MERRILL AT 1339 09/25/20 SDR    Staphylococcus aureus (BCID) NOT DETECTED NOT DETECTED Final   Staphylococcus epidermidis NOT DETECTED NOT DETECTED Final   Staphylococcus lugdunensis NOT DETECTED NOT DETECTED Final   Streptococcus species NOT DETECTED NOT DETECTED Final   Streptococcus agalactiae NOT DETECTED NOT DETECTED Final   Streptococcus pneumoniae NOT DETECTED NOT DETECTED Final   Streptococcus pyogenes NOT DETECTED NOT DETECTED Final   A.calcoaceticus-baumannii NOT DETECTED NOT DETECTED Final   Bacteroides fragilis NOT DETECTED NOT DETECTED Final   Enterobacterales NOT DETECTED NOT DETECTED Final   Enterobacter cloacae complex NOT DETECTED NOT DETECTED Final   Escherichia coli NOT DETECTED NOT DETECTED Final   Klebsiella aerogenes NOT DETECTED NOT DETECTED Final   Klebsiella oxytoca NOT DETECTED NOT DETECTED Final   Klebsiella pneumoniae NOT DETECTED NOT DETECTED Final    Proteus species NOT DETECTED NOT DETECTED Final   Salmonella species NOT DETECTED NOT DETECTED Final   Serratia marcescens NOT DETECTED NOT DETECTED Final   Haemophilus influenzae NOT DETECTED NOT DETECTED Final   Neisseria meningitidis NOT DETECTED NOT DETECTED Final   Pseudomonas aeruginosa NOT DETECTED NOT DETECTED Final   Stenotrophomonas maltophilia NOT DETECTED NOT DETECTED Final   Candida albicans NOT DETECTED NOT DETECTED Final   Candida auris NOT DETECTED NOT DETECTED Final   Candida glabrata NOT DETECTED NOT DETECTED Final   Candida krusei NOT DETECTED NOT DETECTED Final   Candida parapsilosis NOT DETECTED NOT DETECTED Final   Candida tropicalis NOT DETECTED NOT DETECTED Final   Cryptococcus  neoformans/gattii NOT DETECTED NOT DETECTED Final    Comment: Performed at [facility], [address] [place], [place] [address]  Resp Panel by RT-PCR (Flu A&B, Covid) Nasopharyngeal Swab     Status: Abnormal   Collection Time: 10/04/2020  7:40 PM   Specimen: Nasopharyngeal Swab; Nasopharyngeal(NP) swabs in vial transport medium  Result Value Ref Range Status   SARS Coronavirus 2 by RT PCR POSITIVE (A) NEGATIVE Final    Comment: RESULT CALLED TO, READ BACK BY AND VERIFIED WITH: KENDELL SHEETS @2138  ON 09/14/2020 SKL (NOTE) SARS-CoV-2 target nucleic acids are DETECTED.  The SARS-CoV-2 RNA is generally detectable in upper respiratory specimens during the acute phase of infection. Positive results are indicative of the presence of the identified virus, but do not rule out bacterial infection or co-infection with other pathogens not detected by the test. Clinical correlation with patient history and other diagnostic information is necessary to determine patient infection status. The expected result is Negative.  Fact Sheet for Patients: 09/26/20  Fact Sheet for Healthcare Providers: BloggerCourse.com  This test is  not yet approved or cleared by the [place] FDA and  has been authorized for detection and/or diagnosis of SARS-CoV-2 by FDA under an Emergency Use Authorization (EUA).  This EUA will remain in effect (meaning this test can  be used) for the duration of  the COVID-19 declaration under Section 564(b)(1) of the Act, 21 U.S.C. section 360bbb-3(b)(1), unless the authorization is terminated or revoked sooner.     Influenza A by PCR NEGATIVE NEGATIVE Final   Influenza B by PCR NEGATIVE NEGATIVE Final    Comment: (NOTE) The Xpert Xpress SARS-CoV-2/FLU/RSV plus assay is intended as an aid in the diagnosis of influenza from Nasopharyngeal swab specimens and should not be used as a sole basis for treatment. Nasal washings and aspirates are unacceptable for Xpert Xpress SARS-CoV-2/FLU/RSV testing.  Fact Sheet for Patients: [place]  Fact Sheet for Healthcare Providers: BloggerCourse.com  This test is not yet approved or cleared by the [employer] FDA and has been authorized for detection and/or diagnosis of SARS-CoV-2 by FDA under an Emergency Use Authorization (EUA). This EUA will remain in effect (meaning this test can be used) for the duration of the COVID-19 declaration under Section 564(b)(1) of the Act, 21 U.S.C. section 360bbb-3(b)(1), unless the authorization is terminated or revoked.  Performed at [facility], [address] [place], [place]   Blood Culture (routine x 2)     Status: None   Collection Time: 09/09/2020  7:44 PM   Specimen: BLOOD  Result Value Ref Range Status   Specimen Description BLOOD BLOOD LEFT HAND  Final   Special Requests   Final    BOTTLES DRAWN AEROBIC AND ANAEROBIC Blood Culture adequate volume   Culture   Final    NO GROWTH 5 DAYS Performed at [facility], [address] [place], [place]    Report Status 09/29/2020 FINAL  Final  Urine culture      Status: None   Collection Time: 09/25/20  4:07 AM   Specimen: In/Out Cath Urine  Result Value Ref Range Status   Specimen Description   Final    IN/OUT CATH URINE Performed at [facility], [address] [place], [place]    Special Requests   Final    NONE Performed at [facility], [address] [place], [place]    Culture   Final    NO GROWTH  Performed at [facility], [address], [place], [place] [address]    Report Status 09/26/2020 FINAL  Final  MRSA PCR Screening     Status: None   Collection Time: 09/25/20  4:07 AM   Specimen: Nasal Mucosa; Nasopharyngeal  Result Value Ref Range Status   MRSA by PCR NEGATIVE NEGATIVE Final    Comment:        The GeneXpert MRSA Assay (FDA approved for NASAL specimens only), is one component of a comprehensive MRSA colonization surveillance program. It is not intended to diagnose MRSA infection nor to guide or monitor treatment for MRSA infections. Performed at [facility], [address] [place], [place] [address]           IMAGING    No results found.   Nutrition Status: Nutrition Problem: Inadequate oral intake Etiology: inability to eat Signs/Symptoms: NPO status Interventions: Tube feeding,Prostat,MVI     Indwelling Urinary Catheter continued, requirement due to   Reason to continue Indwelling Urinary Catheter strict Intake/Output monitoring for hemodynamic instability   Central Line/ continued, requirement due to  Reason to continue [employer] Monitoring of central venous pressure or other hemodynamic parameters and poor IV access   Ventilator continued, requirement due to severe respiratory failure   Ventilator Sedation RASS 0 to -2      ASSESSMENT AND PLAN SYNOPSIS 59 year old with ARDS, COVID-19 pneumonia, bilateral DVT and small right upper lobe PE   Acute respiratory failure.   Acute COVID19 pneumonia -Remdesevir antiviral  - pharmacy protocol 5 d -vitamin C -zinc -decadron 6mg  IV daily   - utilize external urinary catheter if possible -Self prone if patient can tolerate   for bronchopulmonary hygiene when able -d/c hepatotoxic medications while on remdesevir -supportive care with ICU telemetry monitoring -PT/OT when possible -procalcitonin, CRP and ferritin trending Use intermittent paralytics for vent dyssynchrony. Repeat ABG.  Prone if P/F ratio less than 150 Goal plateau pressure less than 30, goal driving pressure less than 15 Continue remdesivir, steroids Continue baricitinib   NEUROLOGY Acute toxic metabolic encephalopathy, need for sedation Continue fentanyl, propofol. Increase RASS goal to -2-3 as she is dyssynchronous with the vent Intermittent paralytics  SHOCK-SEPSIS Off pressors   CARDIAC ICU monitoring  ID Received ceftriaxone, azithromycin from 12/16- 12/17 1/2 blood cultures are growing staph hominis.  This is likely a contaminant DC vancomycin as kidney function is getting worse  GI GI PROPHYLAXIS as indicated   DIET-->TF's as tolerated Constipation protocol as indicated   ENDO - will use ICU hypoglycemic\Hyperglycemia protocol if indicated Continue Levemir, SSI   DVT/PE Continue heparin drip  RENAL AKI Creatinine continues to increase. Nephrology consult.  Likely headed towards dialysis but no acute need to start today  ELECTROLYTES -follow labs as needed -replace as needed -pharmacy consultation and following  GOALS OF CARE Updated friend 09-16-1990 and sister Kennon Rounds on 12/19 over telephone.  No answer when I called brother Duane Discussed worsening clinical status, possible need for dialysis  Decision made to change status to limited code with no CPR or shocks in case of cardiac arrest. We will try dialysis on a time-limited basis if needed but no prolonged mechanical or dialysis support.   DVT/GI PRX ordered and assessed TRANSFUSIONS AS  NEEDED MONITOR FSBS I Assessed the need for Labs I Assessed the need for Foley I Assessed the need for Central Venous Line Family Discussion when available I Assessed the need for Mobilization I made an Assessment of medications to be adjusted  accordingly Safety Risk assessment completed   CASE DISCUSSED IN MULTIDISCIPLINARY ROUNDS WITH ICU TEAM  The patient is critically ill with multiple organ system failure and requires high complexity decision making for assessment and support, frequent evaluation and titration of therapies, advanced monitoring, review of radiographic studies and interpretation of complex data.   Critical Care Time devoted to patient care services, exclusive of separately billable procedures, described in this note is 33 minutes.    Vida Rigger, M.D.  Pulmonary & Critical Care Medicine  [facility]

## 2020-10-01 NOTE — Consult Note (Signed)
ANTICOAGULATION CONSULT NOTE   Pharmacy Consult for Heparin Indication: DVT/PE  Patient Measurements: Heparin Dosing Weight: 63.5 kg  Labs: Recent Labs    09/29/20 0621 09/29/20 2336 09/30/20 0720 09/30/20 0801 09/30/20 1643 09/30/20 2337 10/01/20 0626 10/01/20 1706  HGB 10.7*  --   --  10.6*  --   --  10.0*  --   HCT 32.7*  --   --  31.1*  --   --  29.7*  --   PLT 331  --   --  330  --   --  307  --   HEPARINUNFRC 0.36   < >  --  0.25*   < > 0.31 0.10* 0.59  CREATININE 4.06*  --  4.08*  --   --   --  3.90*  --   CKTOTAL 40  --   --   --   --   --   --   --    < > = values in this interval not displayed.    Estimated Creatinine Clearance: 15.1 mL/min (A) (by C-G formula based on SCr of 3.9 mg/dL (H)).   Medical History: Past Medical History:  Diagnosis Date  . Anemia    noted during hip surgery   . Anxiety    states no longer issue  . Frequent headaches   . Hypertension   . Lumbago   . 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 Patient was admitted for acute hypoxic respiratory failure 2/2 PNA due to COVID-19. Pharmacy consulted for heparin dosing and monitoring for ACS and pulmonary embolism. CT chest with small pulmonary embolism right upper lobe. Trop elevated 260. CBC stable. No DOAC PTA.   Goal of Therapy:  Heparin level 0.3-0.7 units/ml Monitor platelets by anticoagulation protocol: Yes   12/17 0621 HL = 0.2, bolus 1000 units, increased rate to 900 units/hr  12/17 1802 HL = 0.26, bolus 900 units, increased rate to 1000 units/hr 12/18 0057 HL = 0.29, bolus 900 units, increased rate to 1100 units/hr 12/18 0814 HL = 0.42 x1, rate continued at 1100 units/hr 12/18 1500 HL = 0.40 x2 12/19 0511 HL = 0.58, therapeutic X 3  12/20 0322 HL = 1.84 , supratherapeutic -1100 12/20 0610 HL = 2.0     supratherapeutic-850 12/21 0621  HL = 0.36 therapeutic x 1 12/21 2336 HL = 0.23, subtherapeutic 12/22 0801 HL = 0.25, subtherapeutic 12/22 1643 HL = 0.37, therapeutic x 1 12/22 2339 HL = 0.31, therapeutic x 2 12/23 0626 HL = 0.10, subtherapeutic 12/23 1706 HL = 0.59, therapeutic x1   Plan:  HL therapeutic, continue current heparin drip rate at 1050 units/hr Re-check HL in 8 hours  Daily CBC per protocol while on heparin infusion  Raiford Noble, PharmD Pharmacy Resident  10/01/2020 5:43 PM

## 2020-10-01 NOTE — Consult Note (Signed)
ANTICOAGULATION CONSULT NOTE   Pharmacy Consult for Heparin Indication: DVT/PE  Allergies  Allergen Reactions  . Hydrocodone-Acetaminophen Nausea And Vomiting and Itching    Patient Measurements: Height: 5\' 4"  (162.6 cm) Weight: 75 kg (165 lb 5.5 oz) IBW/kg (Calculated) : 54.7 Heparin Dosing Weight: 63.5 kg  Vital Signs: Temp: 97.52 F (36.4 C) (12/22 2300) BP: 116/71 (12/22 2300) Pulse Rate: 85 (12/22 2300)  Labs: Recent Labs    09/28/20 0322 09/28/20 0622 09/29/20 0621 09/29/20 2336 09/30/20 0720 09/30/20 0801 09/30/20 1643 09/30/20 2337  HGB 11.9*  --  10.7*  --   --  10.6*  --   --   HCT 37.6  --  32.7*  --   --  31.1*  --   --   PLT 427*  --  331  --   --  330  --   --   HEPARINUNFRC 1.84*   < > 0.36   < >  --  0.25* 0.37 0.31  CREATININE 3.31*  --  4.06*  --  4.08*  --   --   --   CKTOTAL  --   --  40  --   --   --   --   --    < > = values in this interval not displayed.    Estimated Creatinine Clearance: 14.5 mL/min (A) (by C-G formula based on SCr of 4.08 mg/dL (H)).   Medical History: Past Medical History:  Diagnosis Date  . Anemia    noted during hip surgery   . Anxiety    states no longer issue  . Frequent headaches   . Hypertension   . Lumbago   . 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 Patient was admitted for acute hypoxic respiratory failure 2/2 PNA due to COVID-19. Pharmacy consulted for heparin dosing and monitoring for ACS and pulmonary embolism. CT chest with small pulmonary embolism right upper lobe. Trop elevated 260. CBC stable. No DOAC PTA.   Goal of Therapy:  Heparin level 0.3-0.7 units/ml Monitor platelets by anticoagulation protocol: Yes   12/17 0621 HL = 0.2, bolus 1000 units, increased rate to 900 units/hr  12/17 1802 HL = 0.26, bolus 900 units, increased rate to 1000  units/hr 12/18 0057 HL = 0.29, bolus 900 units, increased rate to 1100 units/hr 12/18 0814 HL = 0.42 x1, rate continued at 1100 units/hr 12/18 1500 HL = 0.40 x2 12/19 0511 HL = 0.58, therapeutic X 3  12/20 0322 HL = 1.84 , supratherapeutic -1100 12/20 0610 HL = 2.0     supratherapeutic-850 12/21 0621 HL = 0.36 therapeutic x 1 12/21 2336 HL = 0.23, SUBtherapeutic 12/22 0801 HL = 0.25, subtherapeutic 12/22 1643 HL = 0.37, therapeutic x 1 12/22 2339 HL = 0.31, therapeutic x 2   Plan:  12/22 2339 HL 0.31 is therapeutic x2. Continue heparin drip at 850 units/hr. Recheck HL and CBC daily while on heparin drip.  2340, PharmD 10/01/2020 12:46 AM k

## 2020-10-01 NOTE — Progress Notes (Signed)
[place], [place] 10/01/20  Subjective:   Hospital day # 7  cvs:  no pressors Pulm:vent assisted; Fio2 30%;  Gi: TF ongoing 45 cc/hr Propofol Fentanyl Heparin  Renal: 12/22 0701 - 12/23 0700 In: 2581 [I.V.:1291.5; NG/GT:1189.5] Out: 2960 [Urine:2660; Stool:300] Lab Results  Component Value Date   CREATININE 3.90 (H) 10/01/2020   CREATININE 4.08 (H) 09/30/2020   CREATININE 4.06 (H) 09/29/2020   Critically ill appearing  Objective:  Vital signs in last 24 hours:  Temp:  [96.8 F (36 C)-98.6 F (37 C)] 96.98 F (36.1 C) (12/23 0600) Pulse Rate:  [79-116] 91 (12/23 0600) Resp:  [12-30] 30 (12/23 0600) BP: (115-175)/(70-95) 146/84 (12/23 0600) SpO2:  [91 %-100 %] 95 % (12/23 0824) FiO2 (%):  [30 %-35 %] 35 % (12/23 0824) Weight:  [74.3 kg] 74.3 kg (12/23 0312)  Weight change: -0.7 kg Filed Weights   09/29/20 0425 09/30/20 0419 10/01/20 0312  Weight: 73.3 kg 75 kg 74.3 kg    Intake/Output:    Intake/Output Summary (Last 24 hours) at 10/01/2020 0845 Last data filed at 10/01/2020 0600 Gross per 24 hour  Intake 2436.76 ml  Output 2960 ml  Net -523.24 ml    Physical Exam: General:  No acute distress, laying in the bed, critically ill  HEENT  anicteric, ETT in place  Pulm/lungs  vent assisted  CVS/Heart  regular rhythm, tachycardic  Abdomen:   Soft,   Extremities:  + peripheral edema  Neurologic:  sedated  Skin:  No acute rashes  Foley in place    Basic Metabolic Panel:  Recent Labs  Lab 09/26/20 0426 09/27/20 0511 09/28/20 0322 09/29/20 0621 09/30/20 0720 10/01/20 0626  NA 138 136 139 143 145 149*  K 3.7 3.7 4.2 3.9 4.0 4.4  CL 105 105 106 109 109 112*  CO2 21* 20* 21* 19* 21* 22  GLUCOSE 169* 160* 186* 161* 152* 126*  BUN 46* 79* 86* 100* 105* 116*  CREATININE 2.73* 3.26* 3.31* 4.06* 4.08* 3.90*  CALCIUM 7.7* 7.8* 8.0* 7.8* 8.3* 8.5*  MG 2.2 2.4 2.7* 2.8*  --  2.7*  PHOS 3.8 5.1* 8.8* 7.1* 6.4* 6.7*      CBC: Recent Labs  Lab 09/25/2020 1942 09/25/20 0621 09/26/20 0426 09/28/20 0322 09/29/20 0621 09/30/20 0801 10/01/20 0626  WBC 9.6 15.6* 11.1* 18.9* 10.3 13.0* 9.7  NEUTROABS 8.4* 13.7* 9.0*  --   --   --   --   HGB 13.9 13.3 10.6* 11.9* 10.7* 10.6* 10.0*  HCT 42.1 41.6 32.8* 37.6 32.7* 31.1* 29.7*  MCV 85.6 90.6 88.4 90.2 89.1 86.4 86.1  PLT 323 270 276 427* 331 330 307      Lab Results  Component Value Date   HEPBSAG NON REACTIVE 09/27/2020      Microbiology:  Recent Results (from the past 240 hour(s))  Blood Culture (routine x 2)     Status: Abnormal   Collection Time: 09/28/2020  7:39 PM   Specimen: BLOOD  Result Value Ref Range Status   Specimen Description   Final    BLOOD LEFT ANTECUBITAL Performed at [facility], [address] [place], [place] [address]    Special Requests   Final    BOTTLES DRAWN AEROBIC AND ANAEROBIC Blood Culture adequate volume Performed at [facility], [address] [place], [place] [address]    Culture  Setup Time   Final    Organism ID to follow [place]  RESULT CALLED TO, READ BACK BY AND VERIFIED WITH: Chinita Greenland AT [id number] 09/25/20 SDR Performed at [facility], [address] [place], [place] [address]    Culture (A)  Final    STAPHYLOCOCCUS HOMINIS THE SIGNIFICANCE OF ISOLATING THIS ORGANISM FROM A SINGLE SET OF BLOOD CULTURES WHEN MULTIPLE SETS ARE DRAWN IS UNCERTAIN. PLEASE NOTIFY THE MICROBIOLOGY DEPARTMENT WITHIN ONE WEEK IF SPECIATION AND SENSITIVITIES ARE REQUIRED. Performed at [facility], [place] [address] [place], [place] [address]    Report Status 09/27/2020 FINAL  Final  Blood Culture ID Panel (Reflexed)     Status: Abnormal   Collection Time: 09/09/2020  7:39 PM  Result Value Ref Range Status   Enterococcus faecalis NOT DETECTED NOT DETECTED Final   Enterococcus Faecium NOT DETECTED NOT DETECTED Final   Listeria monocytogenes  NOT DETECTED NOT DETECTED Final   Staphylococcus species DETECTED (A) NOT DETECTED Final    Comment: CRITICAL RESULT CALLED TO, READ BACK BY AND VERIFIED WITH:  KRISTIN  MERRILL AT [id number] 09/25/20 SDR    Staphylococcus aureus (BCID) NOT DETECTED NOT DETECTED Final   Staphylococcus epidermidis NOT DETECTED NOT DETECTED Final   Staphylococcus lugdunensis NOT DETECTED NOT DETECTED Final   Streptococcus species NOT DETECTED NOT DETECTED Final   Streptococcus agalactiae NOT DETECTED NOT DETECTED Final   Streptococcus pneumoniae NOT DETECTED NOT DETECTED Final   Streptococcus pyogenes NOT DETECTED NOT DETECTED Final   A.calcoaceticus-baumannii NOT DETECTED NOT DETECTED Final   Bacteroides fragilis NOT DETECTED NOT DETECTED Final   Enterobacterales NOT DETECTED NOT DETECTED Final   Enterobacter cloacae complex NOT DETECTED NOT DETECTED Final   Escherichia coli NOT DETECTED NOT DETECTED Final   Klebsiella aerogenes NOT DETECTED NOT DETECTED Final   Klebsiella oxytoca NOT DETECTED NOT DETECTED Final   Klebsiella pneumoniae NOT DETECTED NOT DETECTED Final   Proteus species NOT DETECTED NOT DETECTED Final   Salmonella species NOT DETECTED NOT DETECTED Final   Serratia marcescens NOT DETECTED NOT DETECTED Final   Haemophilus influenzae NOT DETECTED NOT DETECTED Final   Neisseria meningitidis NOT DETECTED NOT DETECTED Final   Pseudomonas aeruginosa NOT DETECTED NOT DETECTED Final   Stenotrophomonas maltophilia NOT DETECTED NOT DETECTED Final   Candida albicans NOT DETECTED NOT DETECTED Final   Candida auris NOT DETECTED NOT DETECTED Final   Candida glabrata NOT DETECTED NOT DETECTED Final   Candida krusei NOT DETECTED NOT DETECTED Final   Candida parapsilosis NOT DETECTED NOT DETECTED Final   Candida tropicalis NOT DETECTED NOT DETECTED Final   Cryptococcus neoformans/gattii NOT DETECTED NOT DETECTED Final    Comment: Performed at [facility], [address] [place], [place] [address]   Resp Panel by RT-PCR (Flu A&B, Covid) Nasopharyngeal Swab     Status: Abnormal   Collection Time: 09/29/2020  7:40 PM   Specimen: Nasopharyngeal Swab; Nasopharyngeal(NP) swabs in vial transport medium  Result Value Ref Range Status   SARS Coronavirus 2 by RT PCR POSITIVE (A) NEGATIVE Final    Comment: RESULT CALLED TO, READ BACK BY AND VERIFIED WITH: KENDELL SHEETS _0  ON 09/30/2020 SK[facility] (NOTE) SARS-CoV-2 target nucleic acids are DETECTED.  The SARS-CoV-2 RNA is generally detectable in upper respiratory specimens during the acute phase of infection. Positive results are indicative of the presence of the identified virus, but do not rule out bacterial infection or co-infection with other pathogens not detected by the test. Clinical correlation with patient history and other diagnostic information is necessary to determine patient infection status. The  expected result is Negative.  Fact Sheet for Patients: EntrepreneurPulse.com.au  Fact Sheet for Healthcare Providers: IncredibleEmployment.be  This test is not yet approved or cleared by the [place] FDA and  has been authorized for detection and/or diagnosis of SARS-CoV-2 by FDA under an Emergency Use Authorization (EUA).  This EUA will remain in effect (meaning this test can  be used) for the duration of  the COVID-19 declaration under Section 564(b)(1) of the Act, 21 U.S.C. section 360bbb-3(b)(1), unless the authorization is terminated or revoked sooner.     Influenza A by PCR NEGATIVE NEGATIVE Final   Influenza B by PCR NEGATIVE NEGATIVE Final    Comment: (NOTE) The Xpert Xpress SARS-CoV-2/FLU/RSV plus assay is intended as an aid in the diagnosis of influenza from Nasopharyngeal swab specimens and should not be used as a sole basis for treatment. Nasal washings and aspirates are unacceptable for Xpert Xpress SARS-CoV-2/FLU/RSV testing.  Fact Sheet for  Patients: EntrepreneurPulse.com.au  Fact Sheet for Healthcare Providers: IncredibleEmployment.be  This test is not yet approved or cleared by the [place] FDA and has been authorized for detection and/or diagnosis of SARS-CoV-2 by FDA under an Emergency Use Authorization (EUA). This EUA will remain in effect (meaning this test can be used) for the duration of the COVID-19 declaration under Section 564(b)(1) of the Act, 21 U.S.C. section 360bbb-3(b)(1), unless the authorization is terminated or revoked.  Performed at [facility], [address] [place], [place] [address]   Blood Culture (routine x 2)     Status: None   Collection Time: 10/01/2020  7:44 PM   Specimen: BLOOD  Result Value Ref Range Status   Specimen Description BLOOD BLOOD LEFT HAND  Final   Special Requests   Final    BOTTLES DRAWN AEROBIC AND ANAEROBIC Blood Culture adequate volume   Culture   Final    NO GROWTH 5 DAYS Performed at [facility], [address] [place], [place] [address]    Report Status 09/29/2020 FINAL  Final  Urine culture     Status: None   Collection Time: 09/25/20  4:07 AM   Specimen: In/Out Cath Urine  Result Value Ref Range Status   Specimen Description   Final    IN/OUT CATH URINE Performed at [facility], [address] [place], [place] [address]    Special Requests   Final    NONE Performed at [facility], [address], [place], [place] [address]    Culture   Final    NO GROWTH Performed at [facility], [place] [address] [place], [place] [address]    Report Status 09/26/2020 FINAL  Final  MRSA PCR Screening     Status: None   Collection Time: 09/25/20  4:07 AM   Specimen: Nasal Mucosa; Nasopharyngeal  Result Value Ref Range Status   MRSA by PCR NEGATIVE NEGATIVE Final    Comment:        The GeneXpert MRSA Assay (FDA approved for NASAL specimens only), is one component of  a comprehensive MRSA colonization surveillance program. It is not intended to diagnose MRSA infection nor to guide or monitor treatment for MRSA infections. Performed at [facility], [address] [place], [place] [address]     Coagulation Studies: No results for input(s): LABPROT, INR in the last 72 hours.  Urinalysis: No results for input(s): COLORURINE, LABSPEC, PHURINE, GLUCOSEU, HGBUR, BILIRUBINUR, KETONESUR, PROTEINUR, UROBILINOGEN, NITRITE, LEUKOCYTESUR in the last 72 hours.  Invalid input(s): APPERANCEUR  Imaging: DG Chest Port 1 View  Result Date: 09/29/2020 CLINICAL DATA:  Hypoxia. Central catheter placement. COVID-19 positive. EXAM: PORTABLE CHEST 1 VIEW COMPARISON:  September 28, 2020 FINDINGS: Central catheter tip is in the superior vena cava. Endotracheal tube tip is 4.7 cm above the carina. Nasogastric tube tip and side port are in the stomach. No pneumothorax. There is mild atelectasis in the right mid lung. Lungs elsewhere clear. Heart size and pulmonary vascularity are normal. Healed fractures of the left sixth and seventh ribs noted. IMPRESSION: Tube and catheter positions as described without pneumothorax. Slight right midlung atelectasis. Lungs elsewhere clear. Cardiac silhouette normal. Electronically Signed   By: Lowella Grip III M.D.   On: 09/29/2020 13:46     Medications:   . sodium chloride 150 mL/hr at 09/26/20 2000  . feeding supplement (VITAL AF 1.2 CAL) 45 mL/hr at 10/01/20 0600  . fentaNYL infusion INTRAVENOUS 200 mcg/hr (10/01/20 0555)  . heparin 850 Units/hr (10/01/20 0555)  . propofol (DIPRIVAN) infusion 70 mcg/kg/min (10/01/20 0555)   . albuterol  2 puff Inhalation Q6H  . vitamin C  500 mg Per Tube Daily  . chlorhexidine gluconate (MEDLINE KIT)  15 mL Mouth Rinse BID  . Chlorhexidine Gluconate Cloth  6 each Topical Daily  . feeding supplement (PROSource TF)  45 mL Per Tube BID  . fentaNYL (SUBLIMAZE) injection  50 mcg  Intravenous Once  . folic acid  1 mg Per Tube Daily  . insulin aspart  0-15 Units Subcutaneous Q4H  . insulin detemir  5 Units Subcutaneous BID  . mouth rinse  15 mL Mouth Rinse 10 times per day  . multivitamin with minerals  1 tablet Per Tube Daily  . pantoprazole (PROTONIX) IV  40 mg Intravenous Q24H  . predniSONE  50 mg Per Tube Daily  . thiamine  100 mg Per Tube Daily  . zinc sulfate  220 mg Per Tube Daily   acetaminophen, fentaNYL, heparin, midazolam, midazolam, morphine injection, ondansetron **OR** ondansetron (ZOFRAN) IV  Assessment/ Plan:  59 y.o. female with  hypertension, anxiety, anemia who was brought to the ER on December 16 with a chief complaint of cough shortness of breath   admitted on 10/03/2020 for Edema [R60.9] Lactic acid acidosis [E87.2] Acute respiratory failure with hypoxia ([place]) [J96.01] Pneumonia due to COVID-19 virus [U07.1, J12.82]   # Acute Kidney injury with proteinuria AKI likely secondary to ATN and iv contrast exposure (12/172021) 12/22 0701 - 12/23 0700 In: 2581 [I.V.:1291.5; NG/GT:1189.5] Out: 2960 [Urine:2660; Stool:300]   Lab Results  Component Value Date   CREATININE 3.90 (H) 10/01/2020   CREATININE 4.08 (H) 09/30/2020   CREATININE 4.06 (H) 09/29/2020    S Creatinine appears to have peaked patient is non oliguric and urine output trend is improving resp acidosis has improved Dialysis catheter is in place Recommend trial of IV Lasix No acute indication for dialysis today.  We will continue to monitor closely on a daily basis  # Covid 19 pneumonia Complicated by Acute resp failure with hypoxia Small pulmonary embolism right upper lobe pulmonary arteries in a distal pulmonary artery- requiring iv anticoagulation  # hyperphosphatemia Lab Results  Component Value Date   PTH 109 (H) 09/29/2020   CALCIUM 8.5 (L) 10/01/2020   PHOS 6.7 (H) 10/01/2020    CK normal Phos expected to improve with improving UOP or dialysis     LOS:  7 Aundra Espin [facility] 12/23/20218:45 AM  [place], [place]  Note: This note was  prepared with [employer] . Any transcription errors are unintentional

## 2020-10-02 ENCOUNTER — Inpatient Hospital Stay: Payer: Medicaid Other

## 2020-10-02 DIAGNOSIS — I472 Ventricular tachycardia: Secondary | ICD-10-CM | POA: Diagnosis not present

## 2020-10-02 LAB — BLOOD GAS, ARTERIAL
Acid-base deficit: 1.8 mmol/L (ref 0.0–2.0)
Bicarbonate: 23.1 mmol/L (ref 20.0–28.0)
FIO2: 35
MECHVT: 520 mL
Mechanical Rate: 20
O2 Saturation: 98.2 %
PEEP: 8 cmH2O
Patient temperature: 37
pCO2 arterial: 39 mmHg (ref 32.0–48.0)
pH, Arterial: 7.38 (ref 7.350–7.450)
pO2, Arterial: 110 mmHg — ABNORMAL HIGH (ref 83.0–108.0)

## 2020-10-02 LAB — RENAL FUNCTION PANEL
Albumin: 2.7 g/dL — ABNORMAL LOW (ref 3.5–5.0)
Anion gap: 14 (ref 5–15)
BUN: 120 mg/dL — ABNORMAL HIGH (ref 6–20)
CO2: 24 mmol/L (ref 22–32)
Calcium: 8.7 mg/dL — ABNORMAL LOW (ref 8.9–10.3)
Chloride: 111 mmol/L (ref 98–111)
Creatinine, Ser: 3.58 mg/dL — ABNORMAL HIGH (ref 0.44–1.00)
GFR, Estimated: 14 mL/min — ABNORMAL LOW (ref >60)
Glucose, Bld: 119 mg/dL — ABNORMAL HIGH (ref 70–99)
Phosphorus: 5.7 mg/dL — ABNORMAL HIGH (ref 2.5–4.6)
Potassium: 4 mmol/L (ref 3.5–5.1)
Sodium: 149 mmol/L — ABNORMAL HIGH (ref 135–145)

## 2020-10-02 LAB — CBC
HCT: 29.5 % — ABNORMAL LOW (ref 36.0–46.0)
Hemoglobin: 10.1 g/dL — ABNORMAL LOW (ref 12.0–15.0)
MCH: 29 pg (ref 26.0–34.0)
MCHC: 34.2 g/dL (ref 30.0–36.0)
MCV: 84.8 fL (ref 80.0–100.0)
Platelets: 331 10*3/uL (ref 150–400)
RBC: 3.48 MIL/uL — ABNORMAL LOW (ref 3.87–5.11)
RDW: 15.3 % (ref 11.5–15.5)
WBC: 11.7 10*3/uL — ABNORMAL HIGH (ref 4.0–10.5)
nRBC: 0 % (ref 0.0–0.2)

## 2020-10-02 LAB — TROPONIN I (HIGH SENSITIVITY)
Troponin I (High Sensitivity): 30 ng/L — ABNORMAL HIGH (ref <18)
Troponin I (High Sensitivity): 34 ng/L — ABNORMAL HIGH (ref <18)

## 2020-10-02 LAB — MAGNESIUM: Magnesium: 2.4 mg/dL (ref 1.7–2.4)

## 2020-10-02 LAB — GLUCOSE, CAPILLARY
Glucose-Capillary: 136 mg/dL — ABNORMAL HIGH (ref 70–99)
Glucose-Capillary: 132 mg/dL — ABNORMAL HIGH (ref 70–99)
Glucose-Capillary: 155 mg/dL — ABNORMAL HIGH (ref 70–99)
Glucose-Capillary: 197 mg/dL — ABNORMAL HIGH (ref 70–99)
Glucose-Capillary: 190 mg/dL — ABNORMAL HIGH (ref 70–99)

## 2020-10-02 LAB — HEPARIN LEVEL (UNFRACTIONATED): Heparin Unfractionated: 0.42 IU/mL (ref 0.30–0.70)

## 2020-10-02 LAB — C-REACTIVE PROTEIN: CRP: 9.5 mg/dL — ABNORMAL HIGH (ref <1.0)

## 2020-10-02 MED ORDER — METOPROLOL TARTRATE 25 MG PO TABS: 12.5000 mg | ORAL_TABLET | Freq: Two times a day (BID) | ORAL | Status: DC

## 2020-10-02 MED ORDER — METOPROLOL TARTRATE 25 MG PO TABS
25.0000 mg | ORAL_TABLET | Freq: Two times a day (BID) | ORAL | Status: DC
  Administered 2020-10-02: 11:00:00 12.5 mg via ORAL
  Administered 2020-10-02 – 2020-10-16 (×28): 25 mg via ORAL
  Filled 2020-10-02 (×29): qty 1

## 2020-10-02 MED ORDER — METOPROLOL TARTRATE 25 MG PO TABS
12.5000 mg | ORAL_TABLET | Freq: Two times a day (BID) | ORAL | Status: DC
  Administered 2020-10-02: 09:00:00 12.5 mg
  Filled 2020-10-02: qty 1

## 2020-10-02 MED ORDER — AMIODARONE HCL IN DEXTROSE 360-4.14 MG/200ML-% IV SOLN
60.0000 mg/h | INTRAVENOUS | Status: AC
  Administered 2020-10-02: 06:00:00 60 mg/h via INTRAVENOUS

## 2020-10-02 MED ORDER — AMIODARONE HCL IN DEXTROSE 360-4.14 MG/200ML-% IV SOLN
30.0000 mg/h | INTRAVENOUS | Status: DC
  Administered 2020-10-02 – 2020-10-03 (×3): 30 mg/h via INTRAVENOUS
  Filled 2020-10-02 (×3): qty 200

## 2020-10-02 MED ORDER — MIDAZOLAM HCL 2 MG/2ML IJ SOLN
2.0000 mg | Freq: Once | INTRAMUSCULAR | Status: AC
  Administered 2020-10-02: 22:00:00 2 mg via INTRAVENOUS

## 2020-10-02 MED ORDER — PROSOURCE TF PO LIQD
45.0000 mL | Freq: Every day | ORAL | Status: DC
  Administered 2020-10-03 – 2020-10-20 (×17): 45 mL
  Filled 2020-10-02 (×17): qty 45

## 2020-10-02 MED ORDER — VITAL 1.5 CAL PO LIQD
1000.0000 mL | ORAL | Status: AC
  Administered 2020-10-02 – 2020-10-14 (×9): 1000 mL

## 2020-10-02 MED ORDER — MIDAZOLAM 50MG/50ML (1MG/ML) PREMIX INFUSION
0.5000 mg/h | INTRAVENOUS | Status: DC
  Administered 2020-10-03: 20:00:00 2 mg/h via INTRAVENOUS
  Administered 2020-10-03: 12:00:00 0.5 mg/h via INTRAVENOUS
  Administered 2020-10-04 – 2020-10-07 (×17): 10 mg/h via INTRAVENOUS
  Administered 2020-10-08: 04:00:00 2 mg/h via INTRAVENOUS
  Administered 2020-10-08: 20:00:00 4 mg/h via INTRAVENOUS
  Administered 2020-10-09 (×2): 5.5 mg/h via INTRAVENOUS
  Administered 2020-10-10: 8 mg/h via INTRAVENOUS
  Administered 2020-10-10: 20:00:00 6 mg/h via INTRAVENOUS
  Administered 2020-10-10 (×2): 8 mg/h via INTRAVENOUS
  Administered 2020-10-11 – 2020-10-13 (×7): 6 mg/h via INTRAVENOUS
  Administered 2020-10-13: 8 mg/h via INTRAVENOUS
  Administered 2020-10-13: 6 mg/h via INTRAVENOUS
  Administered 2020-10-13: 21:00:00 8 mg/h via INTRAVENOUS
  Administered 2020-10-14 (×4): 9 mg/h via INTRAVENOUS
  Administered 2020-10-14 – 2020-10-16 (×7): 10 mg/h via INTRAVENOUS
  Administered 2020-10-20: 1 mg/h via INTRAVENOUS
  Administered 2020-10-21: 4 mg/h via INTRAVENOUS
  Filled 2020-10-02 (×52): qty 50

## 2020-10-02 MED ORDER — AMIODARONE IV BOLUS ONLY 150 MG/100ML
150.0000 mg | Freq: Once | INTRAVENOUS | Status: AC
  Administered 2020-10-02: 05:00:00 150 mg via INTRAVENOUS

## 2020-10-02 MED ORDER — ALBUMIN HUMAN 25 % IV SOLN
25.0000 g | Freq: Once | INTRAVENOUS | Status: AC
  Administered 2020-10-02: 12:00:00 25 g via INTRAVENOUS
  Filled 2020-10-02: qty 100

## 2020-10-02 MED ORDER — FUROSEMIDE 10 MG/ML IJ SOLN
20.0000 mg | Freq: Every day | INTRAMUSCULAR | Status: DC
  Administered 2020-10-02 – 2020-10-03 (×2): 20 mg via INTRAVENOUS
  Filled 2020-10-02 (×2): qty 2

## 2020-10-02 MED ORDER — LABETALOL HCL 5 MG/ML IV SOLN
10.0000 mg | INTRAVENOUS | Status: DC | PRN
  Administered 2020-10-02 – 2020-10-13 (×13): 10 mg via INTRAVENOUS
  Filled 2020-10-02 (×13): qty 4

## 2020-10-02 MED ORDER — VECURONIUM BROMIDE 10 MG IV SOLR
10.0000 mg | Freq: Once | INTRAVENOUS | Status: AC
  Administered 2020-10-02: 21:00:00 10 mg via INTRAVENOUS
  Filled 2020-10-02: qty 10

## 2020-10-02 NOTE — Progress Notes (Addendum)
BRIEF CRITICAL CARE NOTE  Pt with episode of sustained V-tach (never lost pulse) approximately 2 minutes in duration.  Ordered for Amiodarone Bolus and drip.  Prior to starting gtt she converted back to NSR.  Will proceed with Amiodarone infusion, and will check EKG,Troponin and electrolytes. Consult to Cardiology.  Continue to monitor.    Harlon Ditty, AGACNP-BC [facility] Pager: [phone]

## 2020-10-02 NOTE — Progress Notes (Signed)
Nutrition Follow Up Note   DOCUMENTATION CODES:   Not applicable  INTERVENTION:   Initiate new tube feed regimen of Vital 1.5 @ 24m/hr + Pro-Source 453mdaily via tube  Free water flushes 3049m4 hours to maintain tube patency   Regimen provides 2060kcal/day, 100g/day protein and 1188m80my free water   Provide MVI tablet once daily per tube as it is more complete than liquid form of MVI.  NUTRITION DIAGNOSIS:   Inadequate oral intake related to inability to eat as evidenced by NPO status.  GOAL:   Patient will meet greater than or equal to 90% of their needs  -met with tube feeds   MONITOR:   Vent status,Labs,Weight trends,TF tolerance,I & O's  ASSESSMENT:   59 y35r old female with PMHx of HTN, seizures, anemia, anxiety admitted with [id number] complicated by DVT and probable acute PE.   Pt remains sedated and ventilated. OGT in place. Pt tolerating current tube feeds; will adjust in setting of propofol discontinued. Per chart, pt up ~ 19lbs since admit; pt is currently diuresing. Nephrology following; no plans for HD at this time.   Medications reviewed and include: vitamin C, colace, folic acid, lasix, insulin, MVI, protonix, prednisone, thiamine, precedex, zinc, fentanyl, heparin  Labs reviewed: Na 149(H), K 4.0 wnl, BUN 120(H), creat 3.58(H), P 5.7(H), Mg 2.4 wnl Triglycerides 241(H)- 12/23 Wbc- 11.7(H), Hgb 10.1(L), Hct 29.5(L) cbgs- 136, 132, 155 X 24 hrs   Patient is currently intubated on ventilator support MV: 13.08 L/min Temp (24hrs), Avg:98 F (36.7 C), Min:96.98 F (36.1 C), Max:98.96 F (37.2 C)  Propofol: none   MAP- >65mm66mUOP- 4475ml 44met Order:   Diet Order            Diet NPO time specified  Diet effective now                EDUCATION NEEDS:   No education needs have been identified at this time  Skin:  Skin Assessment: Reviewed RN Assessment  Last BM:  12/24- type 7  Height:   Ht Readings from Last 1 Encounters:   09/09/2020 '5\' 4"'  (1.626 m)   Weight:   Wt Readings from Last 1 Encounters:  10/02/20 72.4 kg   Ideal Body Weight:  54.5 kg  BMI:  Body mass index is 27.4 kg/m.  Estimated Nutritional Needs:   Kcal:  2037kcal  Protein:  97-110 grams  Fluid:  1.9-2.2 L/day  Cale Bethard Koleen DistanceD, LDN Please refer to [facility] and/or RD on-call/weekend/after hours pager

## 2020-10-02 NOTE — Progress Notes (Signed)
[place], [place] 10/02/20  Subjective:   Hospital day # 8  cvs:  no pressors Pulm:vent assisted; Fio2 35%;  Gi: TF ongoing 45 cc/hr Propofol Fentanyl Heparin amiodarone  Renal: 12/23 0701 - 12/24 0700 In: 2201.5 [I.V.:1314; NG/GT:749; IV Piggyback:88.5] Out: 1660 [YTKZS:0109; Emesis/NG output:30; Stool:400] Lab Results  Component Value Date   CREATININE 3.58 (H) 10/02/2020   CREATININE 3.89 (H) 10/01/2020   CREATININE 3.90 (H) 10/01/2020   Critically ill appearing  Objective:  Vital signs in last 24 hours:  Temp:  [96.8 F (36 C)-98.78 F (37.1 C)] 98.78 F (37.1 C) (12/24 0506) Pulse Rate:  [80-112] 97 (12/24 0506) Resp:  [16-26] 18 (12/24 0506) BP: (124-185)/(73-104) 177/93 (12/24 0506) SpO2:  [92 %-97 %] 95 % (12/24 0506) FiO2 (%):  [30 %-35 %] 35 % (12/24 0236) Weight:  [72.4 kg] 72.4 kg (12/24 0500)  Weight change: -1.9 kg Filed Weights   09/30/20 0419 10/01/20 0312 10/02/20 0500  Weight: 75 kg 74.3 kg 72.4 kg    Intake/Output:    Intake/Output Summary (Last 24 hours) at 10/02/2020 0735 Last data filed at 10/02/2020 0543 Gross per 24 hour  Intake 2201.5 ml  Output 4905 ml  Net -2703.5 ml    Physical Exam: General:  No acute distress, laying in the bed, critically ill  HEENT  anicteric, ETT in place, OGT  Pulm/lungs  vent assisted  CVS/Heart  regular rhythm, tachycardic  Abdomen:   Soft,   Extremities:  + peripheral edema  Neurologic:  sedated  Skin:  No acute rashes  Foley in place    Basic Metabolic Panel:  Recent Labs  Lab 09/27/20 0511 09/28/20 0322 09/29/20 0621 09/30/20 0720 10/01/20 0626 10/01/20 1723 10/02/20 0418  NA 136 139 143 145 149* 145 149*  K 3.7 4.2 3.9 4.0 4.4 4.7 4.0  CL 105 106 109 109 112* 109 111  CO2 20* 21* 19* 21* _0 GLUCOSE 160* 186* 161* 152* 126* 257* 119*  BUN 79* 86* 100* 105* 116* 113* 120*  CREATININE 3.26* 3.31* 4.06* 4.08* 3.90* 3.89* 3.58*  CALCIUM 7.8*  8.0* 7.8* 8.3* 8.5* 8.7* 8.7*  MG 2.4 2.7* 2.8*  --  2.7* 2.6*  --   PHOS 5.1* 8.8* 7.1* 6.4* 6.7*  --  5.7*     CBC: Recent Labs  Lab 09/26/20 0426 09/28/20 0322 09/29/20 0621 09/30/20 0801 10/01/20 0626 10/02/20 0418  WBC 11.1* 18.9* 10.3 13.0* 9.7 11.7*  NEUTROABS 9.0*  --   --   --   --   --   HGB 10.6* 11.9* 10.7* 10.6* 10.0* 10.1*  HCT 32.8* 37.6 32.7* 31.1* 29.7* 29.5*  MCV 88.4 90.2 89.1 86.4 86.1 84.8  PLT 276 427* 331 330 307 331      Lab Results  Component Value Date   HEPBSAG NON REACTIVE 09/27/2020      Microbiology:  Recent Results (from the past 240 hour(s))  Blood Culture (routine x 2)     Status: Abnormal   Collection Time: 09/28/2020  7:39 PM   Specimen: BLOOD  Result Value Ref Range Status   Specimen Description   Final    BLOOD LEFT ANTECUBITAL Performed at [facility], [address], [place], [place] [address]    Special Requests   Final    BOTTLES DRAWN AEROBIC AND ANAEROBIC Blood Culture adequate volume Performed at [facility], [address], [place], [place] [address]    Culture  Setup Time  Final    Organism ID to follow GRAM POSITIVE COCCI AEROBIC BOTTLE ONLY CRITICAL RESULT CALLED TO, READ BACK BY AND VERIFIED WITH: Chinita Greenland AT 6503 09/25/20 SDR Performed at [facility], [address] [place], [place] [address]    Culture (A)  Final    STAPHYLOCOCCUS HOMINIS THE SIGNIFICANCE OF ISOLATING THIS ORGANISM FROM A SINGLE SET OF BLOOD CULTURES WHEN MULTIPLE SETS ARE DRAWN IS UNCERTAIN. PLEASE NOTIFY THE MICROBIOLOGY DEPARTMENT WITHIN ONE WEEK IF SPECIATION AND SENSITIVITIES ARE REQUIRED. Performed at [facility], [place] [address] [place], [place] [address]    Report Status 09/27/2020 FINAL  Final  Blood Culture ID Panel (Reflexed)     Status: Abnormal   Collection Time: 10/06/2020  7:39 PM  Result Value Ref Range Status   Enterococcus faecalis NOT DETECTED NOT DETECTED Final    Enterococcus Faecium NOT DETECTED NOT DETECTED Final   Listeria monocytogenes NOT DETECTED NOT DETECTED Final   Staphylococcus species DETECTED (A) NOT DETECTED Final    Comment: CRITICAL RESULT CALLED TO, READ BACK BY AND VERIFIED WITH:  KRISTIN  MERRILL AT 1700 09/25/20 SDR    Staphylococcus aureus (BCID) NOT DETECTED NOT DETECTED Final   Staphylococcus epidermidis NOT DETECTED NOT DETECTED Final   Staphylococcus lugdunensis NOT DETECTED NOT DETECTED Final   Streptococcus species NOT DETECTED NOT DETECTED Final   Streptococcus agalactiae NOT DETECTED NOT DETECTED Final   Streptococcus pneumoniae NOT DETECTED NOT DETECTED Final   Streptococcus pyogenes NOT DETECTED NOT DETECTED Final   A.calcoaceticus-baumannii NOT DETECTED NOT DETECTED Final   Bacteroides fragilis NOT DETECTED NOT DETECTED Final   Enterobacterales NOT DETECTED NOT DETECTED Final   Enterobacter cloacae complex NOT DETECTED NOT DETECTED Final   Escherichia coli NOT DETECTED NOT DETECTED Final   Klebsiella aerogenes NOT DETECTED NOT DETECTED Final   Klebsiella oxytoca NOT DETECTED NOT DETECTED Final   Klebsiella pneumoniae NOT DETECTED NOT DETECTED Final   Proteus species NOT DETECTED NOT DETECTED Final   Salmonella species NOT DETECTED NOT DETECTED Final   Serratia marcescens NOT DETECTED NOT DETECTED Final   Haemophilus influenzae NOT DETECTED NOT DETECTED Final   Neisseria meningitidis NOT DETECTED NOT DETECTED Final   Pseudomonas aeruginosa NOT DETECTED NOT DETECTED Final   Stenotrophomonas maltophilia NOT DETECTED NOT DETECTED Final   Candida albicans NOT DETECTED NOT DETECTED Final   Candida auris NOT DETECTED NOT DETECTED Final   Candida glabrata NOT DETECTED NOT DETECTED Final   Candida krusei NOT DETECTED NOT DETECTED Final   Candida parapsilosis NOT DETECTED NOT DETECTED Final   Candida tropicalis NOT DETECTED NOT DETECTED Final   Cryptococcus neoformans/gattii NOT DETECTED NOT DETECTED Final    Comment:  Performed at [facility], [address] [place], [place] [address]  Resp Panel by RT-PCR (Flu A&B, Covid) Nasopharyngeal Swab     Status: Abnormal   Collection Time: 09/16/2020  7:40 PM   Specimen: Nasopharyngeal Swab; Nasopharyngeal(NP) swabs in vial transport medium  Result Value Ref Range Status   SARS Coronavirus 2 by RT PCR POSITIVE (A) NEGATIVE Final    Comment: RESULT CALLED TO, READ BACK BY AND VERIFIED WITH: KENDELL SHEETS _0  ON 09/12/2020 SKL (NOTE) SARS-CoV-2 target nucleic acids are DETECTED.  The SARS-CoV-2 RNA is generally detectable in upper respiratory specimens during the acute phase of infection. Positive results are indicative of the presence of the identified virus, but do not rule out bacterial infection or co-infection with other pathogens not detected by the test. Clinical correlation  with patient history and other diagnostic information is necessary to determine patient infection status. The expected result is Negative.  Fact Sheet for Patients: EntrepreneurPulse.com.au  Fact Sheet for Healthcare Providers: IncredibleEmployment.be  This test is not yet approved or cleared by the [place] FDA and  has been authorized for detection and/or diagnosis of SARS-CoV-2 by FDA under an Emergency Use Authorization (EUA).  This EUA will remain in effect (meaning this test can  be used) for the duration of  the COVID-19 declaration under Section 564(b)(1) of the Act, 21 U.S.C. section 360bbb-3(b)(1), unless the authorization is terminated or revoked sooner.     Influenza A by PCR NEGATIVE NEGATIVE Final   Influenza B by PCR NEGATIVE NEGATIVE Final    Comment: (NOTE) The Xpert Xpress SARS-CoV-2/FLU/RSV plus assay is intended as an aid in the diagnosis of influenza from Nasopharyngeal swab specimens and should not be used as a sole basis for treatment. Nasal washings and aspirates are unacceptable for Xpert Xpress  SARS-CoV-2/FLU/RSV testing.  Fact Sheet for Patients: EntrepreneurPulse.com.au  Fact Sheet for Healthcare Providers: IncredibleEmployment.be  This test is not yet approved or cleared by the [place] FDA and has been authorized for detection and/or diagnosis of SARS-CoV-2 by FDA under an Emergency Use Authorization (EUA). This EUA will remain in effect (meaning this test can be used) for the duration of the COVID-19 declaration under Section 564(b)(1) of the Act, 21 U.S.C. section 360bbb-3(b)(1), unless the authorization is terminated or revoked.  Performed at [facility], [address] [place], [place] [address]   Blood Culture (routine x 2)     Status: None   Collection Time: 09/26/2020  7:44 PM   Specimen: BLOOD  Result Value Ref Range Status   Specimen Description BLOOD BLOOD LEFT HAND  Final   Special Requests   Final    BOTTLES DRAWN AEROBIC AND ANAEROBIC Blood Culture adequate volume   Culture   Final    NO GROWTH 5 DAYS Performed at [facility], [address] [place], [place] [address]    Report Status 09/29/2020 FINAL  Final  Urine culture     Status: None   Collection Time: 09/25/20  4:07 AM   Specimen: In/Out Cath Urine  Result Value Ref Range Status   Specimen Description   Final    IN/OUT CATH URINE Performed at [facility], [address] [place], [place] [address]    Special Requests   Final    NONE Performed at [facility], [address], [place], [place] [address]    Culture   Final    NO GROWTH Performed at [facility], [place] [address], [place], [place] [address]    Report Status 09/26/2020 FINAL  Final  MRSA PCR Screening     Status: None   Collection Time: 09/25/20  4:07 AM   Specimen: Nasal Mucosa; Nasopharyngeal  Result Value Ref Range Status   MRSA by PCR NEGATIVE NEGATIVE Final    Comment:        The GeneXpert MRSA Assay (FDA approved for NASAL  specimens only), is one component of a comprehensive MRSA colonization surveillance program. It is not intended to diagnose MRSA infection nor to guide or monitor treatment for MRSA infections. Performed at [facility], [address] [place], [place] [address]     Coagulation Studies: No results for input(s): LABPROT, INR in the last 72 hours.  Urinalysis: No results for input(s): COLORURINE, LABSPEC, [place], [place], [place],  BILIRUBINUR, KETONESUR, PROTEINUR, UROBILINOGEN, NITRITE, LEUKOCYTESUR in the last 72 hours.  Invalid input(s): APPERANCEUR    Imaging: DG Chest Port 1 View  Result Date: 10/02/2020 CLINICAL DATA:  Hypoxia. EXAM: PORTABLE CHEST 1 VIEW COMPARISON:  September 29, 2020 FINDINGS: There is stable endotracheal tube, nasogastric tube and left internal jugular venous catheter positioning. Mild to moderate severity multifocal infiltrates are seen throughout the periphery of both lungs. This is increased in severity when compared to the prior study. There is no evidence of a pleural effusion or pneumothorax. The heart size and mediastinal contours are within normal limits. Chronic left-sided rib fractures are seen. IMPRESSION: Mild to moderate severity bilateral multifocal infiltrates, increased in severity when compared to the prior study. Electronically Signed   By: Virgina Norfolk M.D.   On: 10/02/2020 02:08     Medications:   . sodium chloride 150 mL/hr at 09/26/20 2000  . amiodarone 60 mg/hr (10/02/20 0543)  . amiodarone    . dexmedetomidine (PRECEDEX) IV infusion 1.2 mcg/kg/hr (10/02/20 0543)  . feeding supplement (VITAL AF 1.2 CAL) 45 mL/hr at 10/02/20 0000  . fentaNYL infusion INTRAVENOUS 300 mcg/hr (10/02/20 0543)  . heparin 1,050 Units/hr (10/02/20 0543)  . midazolam Stopped (10/02/20 0512)  . propofol (DIPRIVAN) infusion Stopped (10/01/20 1235)   . vitamin C  500 mg Per Tube Daily  . chlorhexidine gluconate (MEDLINE KIT)  15 mL Mouth  Rinse BID  . Chlorhexidine Gluconate Cloth  6 each Topical Daily  . feeding supplement (PROSource TF)  45 mL Per Tube BID  . folic acid  1 mg Per Tube Daily  . furosemide  40 mg Intravenous Daily  . insulin aspart  0-15 Units Subcutaneous Q4H  . insulin detemir  5 Units Subcutaneous BID  . mouth rinse  15 mL Mouth Rinse 10 times per day  . metoprolol tartrate  12.5 mg Per Tube BID  . multivitamin with minerals  1 tablet Per Tube Daily  . pantoprazole (PROTONIX) IV  40 mg Intravenous Q24H  . predniSONE  50 mg Per Tube Daily  . thiamine  100 mg Per Tube Daily  . zinc sulfate  220 mg Per Tube Daily   acetaminophen, fentaNYL, heparin, labetalol, midazolam, morphine injection, ondansetron **OR** ondansetron (ZOFRAN) IV  Assessment/ Plan:  59 y.o. female with  hypertension, anxiety, anemia who was brought to the ER on December 16 with a chief complaint of cough shortness of breath   admitted on 09/19/2020 for Edema [R60.9] Lactic acid acidosis [E87.2] Acute respiratory failure with hypoxia (HCC) [J96.01] Pneumonia due to COVID-19 virus [U07.1, J12.82]   # Acute Kidney injury with proteinuria AKI likely secondary to ATN and iv contrast exposure (12/172021) 12/23 0701 - 12/24 0700 In: 2201.5 [I.V.:1314; NG/GT:749; IV Piggyback:88.5] Out: 5681 [EXNTZ:0017; Emesis/NG output:30; CBSWH:675]   Lab Results  Component Value Date   CREATININE 3.58 (H) 10/02/2020   CREATININE 3.89 (H) 10/01/2020   CREATININE 3.90 (H) 10/01/2020    S Creatinine appears to have peaked and is now improving Good UOP . > 4400 cc last 24 hrs Dialysis catheter is in place-- but has not required HD Will continue iv lasix but at lower dose of 20 mg iv No acute indication for dialysis today.  We will continue to monitor closely on a daily basis  # Covid 19 pneumonia Complicated by Acute resp failure with hypoxia Small pulmonary embolism right upper lobe pulmonary arteries in a distal pulmonary artery- requiring  iv anticoagulation  # hyperphosphatemia Lab Results  Component Value Date   PTH 109 (H) 09/29/2020   CALCIUM 8.7 (L) 10/02/2020   PHOS 5.7 (H) 10/02/2020    CK normal Phos improving     LOS: 8 Jaylei Fuerte 12/24/20217:35 AM  [place], [place]  Note: This note was prepared with Dragon dictation. Any transcription errors are unintentional

## 2020-10-02 NOTE — Consult Note (Signed)
ANTICOAGULATION CONSULT NOTE   Pharmacy Consult for Heparin Indication: DVT/PE  Patient Measurements: Heparin Dosing Weight: 63.5 kg  Labs: Recent Labs    09/29/20 0621 09/29/20 2336 09/30/20 0720 09/30/20 0801 09/30/20 1643 10/01/20 0626 10/01/20 1706 10/01/20 1723 10/02/20 0049  HGB 10.7*  --   --  10.6*  --  10.0*  --   --   --   HCT 32.7*  --   --  31.1*  --  29.7*  --   --   --   PLT 331  --   --  330  --  307  --   --   --   HEPARINUNFRC 0.36   < >  --  0.25*   < > 0.10* 0.59  --  0.42  CREATININE 4.06*  --  4.08*  --   --  3.90*  --  3.89*  --   CKTOTAL 40  --   --   --   --   --   --   --   --    < > = values in this interval not displayed.   Estimated Creatinine Clearance: 15.2 mL/min (A) (by C-G formula based on SCr of 3.89 mg/dL (H)).  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 Patient was admitted for acute hypoxic respiratory failure 2/2 PNA due to COVID-19. Pharmacy consulted for heparin dosing and monitoring for ACS and pulmonary embolism. CT chest with small pulmonary embolism right upper lobe. Trop elevated 260. CBC stable. No DOAC PTA.   Goal of Therapy:  Heparin level 0.3-0.7 units/ml Monitor platelets by anticoagulation protocol: Yes   12/17 0621 HL = 0.2, bolus 1000 units, increased rate to 900 units/hr  12/17 1802 HL = 0.26, bolus 900 units, increased rate to 1000 units/hr 12/18 0057 HL = 0.29, bolus 900 units, increased rate to 1100 units/hr 12/18 0814 HL = 0.42 x1, rate continued at 1100 units/hr 12/18 1500 HL = 0.40 x2 12/19 0511 HL = 0.58, therapeutic X 3  12/20 0322 HL = 1.84 , supratherapeutic -1100 12/20 0610 HL  = 2.0     supratherapeutic-850 12/21 0621 HL = 0.36 therapeutic x 1 12/21 2336 HL = 0.23, subtherapeutic 12/22 0801 HL = 0.25, subtherapeutic 12/22 1643 HL = 0.37, therapeutic x 1 12/22 2339 HL = 0.31, therapeutic x 2 12/23 0626 HL = 0.10, subtherapeutic 12/23 1706 HL = 0.59, therapeutic x1 12/24 0049 HL = 0.42, therapeutic x 2   Plan:  HL therapeutic, continue current heparin drip rate at 1050 units/hr Re-check HL daily w/ CBC Daily CBC per protocol while on heparin infusion  Wayland Denis, PharmD 10/02/2020 2:30 AM

## 2020-10-02 NOTE — Consult Note (Signed)
[facility] Consultation Note  Patient ID: Margaret Shepard, MRN: [id number], DOB/AGE: [birth year] 59 y.o. Admit date: 10/05/2020   Date of Consult: 10/02/2020 Primary Physician: Venita Lick, NP Primary Cardiologist: None  Chief Complaint:  Chief Complaint  Patient presents with  . Shortness of Breath   Reason for Consult: Tachycardia  HPI: 59 y.o. female with no previous history of cardiovascular disease having significant Covid pneumonia with complications including pulmonary embolism and respiratory failure as well as renal failure. The patient has been intubated for quite some time and has had a pulmonary embolism by CT scan showing diffuse infiltrates and Covid pneumonia. The patient has had an echocardiogram showing normal LV systolic function with ejection fraction of 60% with moderate right ventricular and right atrial enlargement but no apparent significant pulmonary hypertension. The patient has had a BNP of 294 and a hemoglobin of 10.1. Renal failure has slightly improved now with diuresis with Lasix. The patient has had multiple periods of supraventricular tachycardia which appeared to be atrial flutter with 2-1 block for short periods of time. The patient last night had an episode of atrial flutter with rapid ventricular rate with 2-1 block with narrow complex and then had a change in her QRS pattern with wider complex at approximately 150 to 160 bpm. This spontaneously converted as before to normal sinus rhythm. Amiodarone was started and infusion has ensued for which the patient has not had any recurrent episode. There is no current evidence of ventricular tachycardia and or reasons for that particular rhythm but multiple reasons for atrial flutter due to RV and RA enlargement pulmonary embolism and current respiratory condition. The patient also has had elevation of troponin of 260/564/713/773 early in her hospitalization most consistent with demand ischemia from  hypoxia pulmonary embolism and acute renal failure with Covid pneumonia rather than acute coronary syndrome.  Past Medical History:  Diagnosis Date  . Anemia    noted during hip surgery   . Anxiety    states no longer issue  . Frequent headaches   . Hypertension   . Lumbago   . 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 BRAIN SURGERY    . JOINT REPLACEMENT Bilateral   . JOINT REPLACEMENT Left    revision  . MANDIBLE SURGERY       Home Meds: Prior to Admission medications   Medication Sig Start Date End Date Taking? Authorizing Provider  albuterol (VENTOLIN HFA) 108 (90 Base) MCG/ACT inhaler Inhale 2 puffs into the lungs every 4 (four) hours as needed for wheezing or shortness of breath. 09/22/20  Yes Cannady, Jolene T, NP  cyclobenzaprine (FLEXERIL) 5 MG tablet Take 5 mg by mouth 3 (three) times daily as needed for muscle spasms.   Yes [provider]  Dextromethorphan-guaiFENesin (CORICIDIN HBP CONGESTION/COUGH) 10-200 MG CAPS Take 1 capsule by mouth every 4 (four) hours as needed. 09/22/20  Yes Cannady, Jolene T, NP  gabapentin (NEURONTIN) 300 MG capsule Take 600-1,200 mg by mouth daily.   Yes [provider]  hydrochlorothiazide (HYDRODIURIL) 12.5 MG tablet TAKE 1 TABLET BY MOUTH ONCE DAILY. Patient taking differently: Take 12.5 mg by mouth daily. 09/08/20  Yes Cannady, Jolene T, NP  oxyCODONE (ROXICODONE) 15 MG immediate release tablet Take 15 mg by mouth every 4 (four) hours as needed (breakthrough pain).   Yes [provider]  oxyCODONE 30  MG 12 hr tablet Take 30 mg by mouth See admin instructions. Take 1 tablet (73m) by mouth every 8 to 12 hours   Yes [provider]    Inpatient Medications:  . vitamin C  500 mg Per Tube Daily  . chlorhexidine gluconate (MEDLINE KIT)  15 mL Mouth Rinse BID  . Chlorhexidine Gluconate Cloth  6 each Topical  Daily  . feeding supplement (PROSource TF)  45 mL Per Tube BID  . folic acid  1 mg Per Tube Daily  . furosemide  20 mg Intravenous Daily  . insulin aspart  0-15 Units Subcutaneous Q4H  . insulin detemir  5 Units Subcutaneous BID  . mouth rinse  15 mL Mouth Rinse 10 times per day  . metoprolol tartrate  12.5 mg Per Tube BID  . multivitamin with minerals  1 tablet Per Tube Daily  . pantoprazole (PROTONIX) IV  40 mg Intravenous Q24H  . predniSONE  50 mg Per Tube Daily  . thiamine  100 mg Per Tube Daily  . zinc sulfate  220 mg Per Tube Daily   . sodium chloride 150 mL/hr at 09/26/20 2000  . amiodarone 60 mg/hr (10/02/20 0543)  . amiodarone    . dexmedetomidine (PRECEDEX) IV infusion 1.2 mcg/kg/hr (10/02/20 0947)  . feeding supplement (VITAL AF 1.2 CAL) 1,000 mL (10/02/20 0946)  . fentaNYL infusion INTRAVENOUS 300 mcg/hr (10/02/20 0543)  . heparin 1,050 Units/hr (10/02/20 0543)  . midazolam Stopped (10/02/20 0512)  . propofol (DIPRIVAN) infusion Stopped (10/01/20 1235)    Allergies:  Allergies  Allergen Reactions  . Hydrocodone-Acetaminophen Nausea And Vomiting and Itching    Social History   Socioeconomic History  . Marital status: Divorced    Spouse name: Not on file  . Number of children: 0  . Years of education: Not on file  . Highest education level: Not on file  Occupational History  . Not on file  Tobacco Use  . Smoking status: Former SResearch scientist (life sciences) . Smokeless tobacco: Never Used  . Tobacco comment: 8-9 years ago  Vaping Use  . Vaping Use: Never used  Substance and Sexual Activity  . Alcohol use: Yes    Alcohol/week: 0.0 standard drinks    Comment: socially  . Drug use: No  . Sexual activity: Not Currently  Other Topics Concern  . Not on file  Social History Narrative  . Not on file   Social Determinants of Health   Financial Resource Strain: Not on file  Food Insecurity: Not on file  Transportation Needs: Not on file  Physical Activity: Not on file   Stress: Not on file  Social Connections: Not on file  Intimate Partner Violence: Not on file     Family History  Problem Relation Age of Onset  . Arthritis Mother   . COPD Mother   . Hyperlipidemia Mother   . Kidney disease Mother   . Alcohol abuse Father   . COPD Sister   . Stroke Sister   . Hypertension Sister   . Cancer Maternal Grandmother      Review of Systems Cannot assess Labs: No results for input(s): CKTOTAL, CKMB, TROPONINI in the last 72 hours. Lab Results  Component Value Date   WBC 11.7 (H) 10/02/2020   HGB 10.1 (L) 10/02/2020   HCT 29.5 (L) 10/02/2020   MCV 84.8 10/02/2020   PLT 331 10/02/2020    Recent Labs  Lab 09/29/20 0621 09/30/20 0720 10/02/20 0418  NA 143   < > 149*  K 3.9   < > 4.0  CL 109   < > 111  CO2 19*   < > 24  BUN 100*   < > 120*  CREATININE 4.06*   < > 3.58*  CALCIUM 7.8*   < > 8.7*  PROT 5.7*  --   --   BILITOT 0.9  --   --   ALKPHOS 72  --   --   ALT 18  --   --   AST 20  --   --   GLUCOSE 161*   < > 119*   < > = values in this interval not displayed.   Lab Results  Component Value Date   TRIG 241 (H) 10/01/2020   No results found for: DDIMER  Radiology/Studies:  DG Abd 1 View  Result Date: 09/25/2020 CLINICAL DATA:  Tube placement EXAM: ABDOMEN - 1 VIEW COMPARISON:  None. FINDINGS: NG tube tip is in the distal stomach. Bilateral airspace disease again noted. IMPRESSION: NG tube tip in the distal stomach. Electronically Signed   By: Rolm Baptise M.D.   On: 09/25/2020 03:54   CT ANGIO CHEST PE W OR WO CONTRAST  Result Date: 09/25/2020 CLINICAL DATA:  Acute hypoxic respiratory failure secondary to COVID-19 EXAM: CT ANGIOGRAPHY CHEST WITH CONTRAST TECHNIQUE: Multidetector CT imaging of the chest was performed using the standard protocol during bolus administration of intravenous contrast. Multiplanar CT image reconstructions and MIPs were obtained to evaluate the vascular anatomy. CONTRAST:  52m OMNIPAQUE IOHEXOL 350  MG/ML SOLN COMPARISON:  Portable chest 09/26/2019 FINDINGS: Cardiovascular: Small pulmonary embolism right upper lobe pulmonary arteries in a distal pulmonary artery. Two small branch pulmonary emboli right upper lobe. No central pulmonary embolism. Pulmonary arteries normal in caliber. Pulmonary arteries normal in caliber. Negative thoracic aorta. Heart size within normal limits. Minimal pericardial thickening. Mediastinum/Nodes: Endotracheal tube in good position. NG tube in the stomach. No mediastinal mass or hematoma. Lungs/Pleura: Extensive diffuse bilateral airspace disease compatible with COVID pneumonia. Bibasilar atelectasis. No significant pleural effusion. Upper Abdomen: No acute abnormality. Musculoskeletal: No acute skeletal abnormality. Review of the MIP images confirms the above findings. IMPRESSION: Small pulmonary embolism right upper lobe. Diffuse bilateral airspace disease compatible with COVID pneumonia. Bibasilar atelectasis. These results will be called to the ordering clinician or representative by the Radiologist Assistant, and communication documented in the PACS or [employer] Electronically Signed   By: CFranchot GalloM.D.   On: 09/25/2020 12:47   [place]NAL  Result Date: 09/27/2020 CLINICAL DATA:  ATN EXAM: RENAL / URINARY TRACT ULTRASOUND COMPLETE COMPARISON:  None. FINDINGS: Right Kidney: Renal measurements: 1.3 x 6.8 x 5.7 cm = volume: 232 mL. Diffusely increased echogenicity. No hydronephrosis. Left Kidney: Renal measurements: 10.7 x 5.2 x 4.8 cm = volume: 140 mL. Diffusely increased echogenicity. No hydronephrosis. Bladder: Bladder is completely decompressed and is not visualized. Other: None. IMPRESSION: 1. Increased echogenicity of bilateral kidneys as can be seen in medical renal disease. No hydronephrosis. Electronically Signed   By: SValentino SaxonMD   On: 09/27/2020 14:54   [place]Venous Img Lower Bilateral (DVT)  Addendum Date: 09/25/2020   ADDENDUM REPORT:  09/25/2020 07:27 ADDENDUM: Study discussed by telephone with Dr. CJenell Millinerin the ICU on 09/25/2020 at 0724 hours. Electronically Signed   By: HGenevie AnnM.D.   On: 09/25/2020 07:27   Result Date: 09/25/2020 CLINICAL DATA:  59year old female with COVID-19. Bilateral lower extremity edema. EXAM: BILATERAL LOWER EXTREMITY VENOUS DOPPLER ULTRASOUND TECHNIQUE: Gray-scale sonography  with graded compression, as well as color Doppler and duplex ultrasound were performed to evaluate the lower extremity deep venous systems from the level of the common femoral vein and including the common femoral, femoral, profunda femoral, popliteal and calf veins including the posterior tibial, peroneal and gastrocnemius veins when visible. The superficial great saphenous vein was also interrogated. Spectral Doppler was utilized to evaluate flow at rest and with distal augmentation maneuvers in the common femoral, femoral and popliteal veins. COMPARISON:  No prior ultrasound. FINDINGS: RIGHT LOWER EXTREMITY Common Femoral Vein: No evidence of thrombus. Normal compressibility, respiratory phasicity and response to augmentation. Saphenofemoral Junction: No evidence of thrombus. Normal compressibility and flow on color Doppler imaging. Profunda Femoral Vein: No evidence of thrombus. Normal compressibility and flow on color Doppler imaging. Femoral Vein: No evidence of thrombus. Normal compressibility, respiratory phasicity and response to augmentation. Popliteal Vein: The proximal popliteal vein appears compressible and patent (image 6), but there is echogenic thrombus in non compressible distal popliteal vein (image 9) and continuing into the calf veins. See image 27. Calf Veins: Thrombus.  See image 31. Other Findings:  None. LEFT LOWER EXTREMITY Common Femoral Vein: Echogenic thrombus in noncompressible vessel (image 32). Saphenofemoral Junction: Appears to remain patent (image 37). Profunda Femoral Vein: Appears to remain patent (image 41).  Femoral Vein: Thrombus in the proximal and mid left femoral vein. The distal femoral vein appears to remain patent. Popliteal Vein: Incompressible with echogenic thrombus (images 57-59). Thrombus extends into the calf veins (image 61). Calf Veins: Thrombus.  See image 64. Other Findings:  None. IMPRESSION: Positive for BILATERAL lower extremity DVT, more extensive on the left. Electronically Signed: By: Genevie Ann M.D. On: 09/25/2020 06:59   DG Chest Port 1 View  Result Date: 10/02/2020 CLINICAL DATA:  Hypoxia. EXAM: PORTABLE CHEST 1 VIEW COMPARISON:  September 29, 2020 FINDINGS: There is stable endotracheal tube, nasogastric tube and left internal jugular venous catheter positioning. Mild to moderate severity multifocal infiltrates are seen throughout the periphery of both lungs. This is increased in severity when compared to the prior study. There is no evidence of a pleural effusion or pneumothorax. The heart size and mediastinal contours are within normal limits. Chronic left-sided rib fractures are seen. IMPRESSION: Mild to moderate severity bilateral multifocal infiltrates, increased in severity when compared to the prior study. Electronically Signed   By: Virgina Norfolk M.D.   On: 10/02/2020 02:08   DG Chest Port 1 View  Result Date: 09/29/2020 CLINICAL DATA:  Hypoxia. Central catheter placement. COVID-19 positive. EXAM: PORTABLE CHEST 1 VIEW COMPARISON:  September 28, 2020 FINDINGS: Central catheter tip is in the superior vena cava. Endotracheal tube tip is 4.7 cm above the carina. Nasogastric tube tip and side port are in the stomach. No pneumothorax. There is mild atelectasis in the right mid lung. Lungs elsewhere clear. Heart size and pulmonary vascularity are normal. Healed fractures of the left sixth and seventh ribs noted. IMPRESSION: Tube and catheter positions as described without pneumothorax. Slight right midlung atelectasis. Lungs elsewhere clear. Cardiac silhouette normal. Electronically  Signed   By: Lowella Grip III M.D.   On: 09/29/2020 13:46   DG Chest Port 1 View  Result Date: 09/28/2020 CLINICAL DATA:  Respiratory failure. EXAM: PORTABLE CHEST 1 VIEW COMPARISON:  09/27/2020. FINDINGS: Endotracheal tube and NG tube in stable position. Heart size normal. Diffuse bilateral interstitial prominence again noted without interim change. Tiny bilateral pleural effusions can not be excluded. No pneumothorax. IMPRESSION: 1. Lines and tubes in  stable position. 2. Diffuse bilateral interstitial prominence again noted without interim change. Tiny bilateral pleural effusions can not be excluded. Electronically Signed   By: Marcello Moores  Register   On: 09/28/2020 06:45   DG Chest Port 1 View  Result Date: 09/27/2020 CLINICAL DATA:  ARDS. EXAM: PORTABLE CHEST 1 VIEW COMPARISON:  September 27, 2020 FINDINGS: The endotracheal tube terminates above the carina. The enteric tube extends below the left hemidiaphragm. The heart size is stable. There are bilateral pleural effusions and bilateral airspace opacities, similar to prior study. There is no pneumothorax. There are old healed left-sided rib fractures. IMPRESSION: 1. Lines and tubes as above. 2. No significant interval change. Electronically Signed   By: Constance Holster M.D.   On: 09/27/2020 21:04   DG Chest Port 1 View  Result Date: 09/27/2020 CLINICAL DATA:  Acute respiratory failure.  History of hypertension. EXAM: PORTABLE CHEST 1 VIEW COMPARISON:  September 26, 2020 FINDINGS: The ETT is in good position. The NG tube terminates below today's film. No pneumothorax. Bibasilar infiltrates are mildly more prominent the interval. No other interval changes. IMPRESSION: 1. Support apparatus as above. 2. Bibasilar infiltrates, mildly more prominent in the interval. Electronically Signed   By: Dorise Bullion III M.D   On: 09/27/2020 09:05   DG Chest Port 1 View  Result Date: 09/26/2020 CLINICAL DATA:  ETT EXAM: PORTABLE CHEST 1 VIEW COMPARISON:   September 25, 2020 FINDINGS: The ETT is in good position. The OG tube terminates below today's film. No pneumothorax. Bilateral pulmonary infiltrates persist, improved in the interval. The cardiomediastinal silhouette is stable. IMPRESSION: 1. Support apparatus as above. 2. Infiltrate remains in the right base, improved in the interval. Electronically Signed   By: Dorise Bullion III M.D   On: 09/26/2020 08:29   DG Chest Port 1 View  Result Date: 09/25/2020 CLINICAL DATA:  89-year-old female with COVID-19 and bilateral lower extremity DVT. Intubated. EXAM: PORTABLE CHEST 1 VIEW COMPARISON:  Portable chest 0340 hours today and earlier. FINDINGS: Portable AP upright view at 0654 hours. Endotracheal tube tip in good position just below the clavicles. Enteric tube courses to the abdomen with side hole visible at the gastric body. Stable lung volumes. Mediastinal contours remain within normal limits. Hazy and confluent left lung and right lower lung opacity. No pneumothorax or pleural effusion. The left lung opacity has progressed since 09/10/2020. Negative visible bowel gas pattern. IMPRESSION: 1. Satisfactory ET tube and enteric tube. 2. Progressed left lung opacity since 10/06/2020. Stable patchy right lower lobe opacity. No pneumothorax or pleural effusion identified. Electronically Signed   By: Genevie Ann M.D.   On: 09/25/2020 07:29   DG Chest Port 1 View  Result Date: 09/25/2020 CLINICAL DATA:  Acute respiratory failure EXAM: PORTABLE CHEST 1 VIEW COMPARISON:  09/28/2020 FINDINGS: Endotracheal to is 4 cm above the carina. NG tube is in the stomach. Patchy bilateral airspace disease, right greater than left, similar to prior study. Heart is normal size. No effusions or pneumothorax. IMPRESSION: Support devices in expected position as above. Bilateral airspace disease, not significantly changed since prior study. Electronically Signed   By: Rolm Baptise M.D.   On: 09/25/2020 03:54   DG Chest Port 1  View  Result Date: 09/21/2020 CLINICAL DATA:  COVID-19 EXAM: PORTABLE CHEST 1 VIEW COMPARISON:  01/10/2011 FINDINGS: Multifocal consolidation, worst in the lower right lung. No pneumothorax or sizable pleural effusion. Normal cardiomediastinal contours. IMPRESSION: Multifocal pneumonia. Electronically Signed   By: Cletus Gash.D.  On: 10/03/2020 20:15   ECHOCARDIOGRAM COMPLETE  Result Date: 09/26/2020    ECHOCARDIOGRAM REPORT   Patient Name:   Margaret Shepard [facility] Date of Exam: 09/26/2020 Medical Rec #:  [id number]           Height:       64.0 in Accession #:    [id number]          Weight:       142.2 lb Date of Birth:  Dec 27, 1960            BSA:          1.692 m Patient Age:    3 years            BP:           85/61 mmHg Patient Gender: F                   HR:           87 bpm. Exam Location:  [facility] Procedure: 2D Echo Indications:     Acute Respiratory Distress  History:         Patient has no prior history of Echocardiogram examinations.                  Risk Factors:Hypertension.  Sonographer:     Wallace Keller Thornton-Maynard Referring Phys:  [id number] Awilda Bill Diagnosing Phys: Kate Sable MD IMPRESSIONS  1. Left ventricular ejection fraction, by estimation, is 55 to 60%. The left ventricle has normal function. The left ventricle has no regional wall motion abnormalities. Left ventricular diastolic parameters are indeterminate.  2. Right ventricular systolic function is low normal. The right ventricular size is mildly enlarged. There is normal pulmonary artery systolic pressure.  3. Right atrial size was moderately dilated.  4. The mitral valve is normal in structure. No evidence of mitral valve regurgitation.  5. The aortic valve is grossly normal. Aortic valve regurgitation is not visualized. FINDINGS  Left Ventricle: Left ventricular ejection fraction, by estimation, is 55 to 60%. The left ventricle has normal function. The left ventricle has no regional wall motion abnormalities. The left  ventricular internal cavity size was normal in size. There is  no left ventricular hypertrophy. Left ventricular diastolic parameters are indeterminate. Right Ventricle: The right ventricular size is mildly enlarged. No increase in right ventricular wall thickness. Right ventricular systolic function is low normal. There is normal pulmonary artery systolic pressure. The tricuspid regurgitant velocity is 2.49 m/s, and with an assumed right atrial pressure of 3 mmHg, the estimated right ventricular systolic pressure is 56.8 mmHg. Left Atrium: Left atrial size was normal in size. Right Atrium: Right atrial size was moderately dilated. Pericardium: There is no evidence of pericardial effusion. Mitral Valve: The mitral valve is normal in structure. No evidence of mitral valve regurgitation. Tricuspid Valve: The tricuspid valve is normal in structure. Tricuspid valve regurgitation is mild. Aortic Valve: The aortic valve is grossly normal. Aortic valve regurgitation is not visualized. Aortic valve peak gradient measures 7.3 mmHg. Pulmonic Valve: The pulmonic valve was not well visualized. Pulmonic valve regurgitation is not visualized. Aorta: The aortic root is normal in size and structure. Venous: IVC assessment for right atrial pressure unable to be performed due to mechanical ventilation. IAS/Shunts: No atrial level shunt detected by color flow Doppler.  LEFT VENTRICLE PLAX 2D LVIDd:         4.28 cm  Diastology LVIDs:         3.30 cm  LV e' medial:    5.66 cm/s LV PW:         0.98 cm  LV E/e' medial:  10.7 LV IVS:        1.03 cm  LV e' lateral:   7.40 cm/s LVOT diam:     1.80 cm  LV E/e' lateral: 8.2 LV SV:         39 LV SV Index:   23 LVOT Area:     2.54 cm  RIGHT VENTRICLE RV Basal diam:  4.18 cm RV S prime:     7.29 cm/s TAPSE (M-mode): 1.2 cm LEFT ATRIUM             Index       RIGHT ATRIUM           Index LA diam:        2.70 cm 1.60 cm/m  RA Area:     23.30 cm LA Vol (A2C):   42.7 ml 25.23 ml/m RA Volume:    80.80 ml  47.74 ml/m LA Vol (A4C):   26.8 ml 15.84 ml/m LA Biplane Vol: 33.5 ml 19.79 ml/m  AORTIC VALVE                PULMONIC VALVE AV Area (Vmax): 1.49 cm    PV Vmax:       0.72 m/s AV Vmax:        135.00 cm/s PV Peak grad:  2.1 mmHg AV Peak Grad:   7.3 mmHg LVOT Vmax:      79.00 cm/s LVOT Vmean:     54.600 cm/s LVOT VTI:       0.153 m  AORTA Ao Root diam: 3.00 cm MITRAL VALVE               TRICUSPID VALVE MV Area (PHT): 4.68 cm    TR Peak grad:   24.8 mmHg MV E velocity: 60.40 cm/s  TR Vmax:        249.00 cm/s MV A velocity: 63.40 cm/s MV E/A ratio:  0.95        SHUNTS                            Systemic VTI:  0.15 m                            Systemic Diam: 1.80 cm Kate Sable MD Electronically signed by Kate Sable MD Signature Date/Time: 09/26/2020/11:49:11 AM    Final    [place] EKG SITE RITE  Result Date: 09/25/2020 If Site Rite image not attached, placement could not be confirmed due to current cardiac rhythm.   EKG: Sinus tachycardia otherwise normal EKG  Weights: Filed Weights   09/30/20 0419 10/01/20 0312 10/02/20 0500  Weight: 75 kg 74.3 kg 72.4 kg     Physical Exam: Blood pressure (!) 177/93, pulse 97, temperature 98.78 F (37.1 C), resp. rate 18, height '5\' 4"'  (1.626 m), weight 72.4 kg, SpO2 95 %. Body mass index is 27.4 kg/m. As per acute and critical care    Assessment: 59 year old female with no previous cardiovascular history having Covid pneumonia pulmonary embolism right ventricular and right atrial enlargement acute renal failure and recurrent episodes of atrial flutter with rapid ventricular rate with narrow complex now with a 2-minute. Wider complex spontaneously converted to normal rhythm and currently stable with no evidence of ventricular tachycardia  Plan: 1. Continue  amiodarone infusion for 24 hours and then likely okay for discontinuation due to no current evidence of ventricular tachycardia and main rhythm being atrial flutter with  wide-complex 2. Continuation of metoprolol and will slight increase in medication management dosage for further treatment and management of above rhythm disturbances 3. Continue diuresis with Lasix watching closely for hypokalemia and other electrolyte abnormalities including magnesium which may promote tachycardia 4. No further cardiac diagnostics necessary at this time  Signed, Corey Skains M.D. [facility] 10/02/2020, 10:04 AM

## 2020-10-02 NOTE — Progress Notes (Signed)
CRITICAL CARE NOTE  59 yo female admitted with acute hypoxic respiratory failure secondary to pneumonia due to COVID-19 requiring HHFNC and NRB now intubated and severe resp failure complicated by DVT and PE  12/16 intubated 12/17 high risk for cardiac arrest, on pressors 12/18 Off pressors 12/19 worsening renal function.  Nephrology consult 09/28/20- patient was noted to be severely acidemic with HAGMA in am s/p 2 amp bicarb with repeat ABG significantly improved 12/21- patient will require HD, will place vascath today. Meds reviewed and changed, TG elevated on propofol. DCd IVF. 12/22-  Patient remains critically ill. Reviewed plan with nephrologist, patient will not have HD today. 10/01/20- patient on 35%FIo2 8peep, she diruesed >3L we will hold HD, continue diuresis. SBT.  10/02/20- patient with tachyarrythmia overnight.  S/p cardiology evaluation. For SBT today.    CC  follow up respiratory failure  SUBJECTIVE FiO2 down to 35% Dyssynchronous with the ventilator Renal function is worse  Vent Mode: PRVC FiO2 (%):  [30 %-35 %] 35 % Set Rate:  [20 bmp-30 bmp] 20 bmp Vt Set:  [520 mL] 520 mL PEEP:  [6 cmH20-8 cmH20] 8 cmH20 Pressure Support:  [8 cmH20] 8 cmH20 Plateau Pressure:  [18 cmH20] 18 cmH20  CBC    Component Value Date/Time   WBC 11.7 (H) 10/02/2020 0418   RBC 3.48 (L) 10/02/2020 0418   HGB 10.1 (L) 10/02/2020 0418   HGB 13.6 07/08/2012 2019   HCT 29.5 (L) 10/02/2020 0418   HCT 39.4 07/08/2012 2019   PLT 331 10/02/2020 0418   PLT 202 07/08/2012 2019   MCV 84.8 10/02/2020 0418   MCV 91 07/08/2012 2019   MCH 29.0 10/02/2020 0418   MCHC 34.2 10/02/2020 0418   RDW 15.3 10/02/2020 0418   RDW 12.6 07/08/2012 2019   LYMPHSABS 1.3 09/26/2020 0426   MONOABS 0.7 09/26/2020 0426   EOSABS 0.0 09/26/2020 0426   BASOSABS 0.0 09/26/2020 0426   BMP Latest Ref Rng & Units 10/02/2020 10/01/2020 10/01/2020  Glucose 70 - 99 mg/dL 671(I) 458(K) 998(P)  BUN 6 - 20 mg/dL  382(N) 053(Z) 767(H)  Creatinine 0.44 - 1.00 mg/dL 4.19(F) 7.90(W) 4.09(B)  Sodium 135 - 145 mmol/L 149(H) 145 149(H)  Potassium 3.5 - 5.1 mmol/L 4.0 4.7 4.4  Chloride 98 - 111 mmol/L 111 109 112(H)  CO2 22 - 32 mmol/L 24 22 22   Calcium 8.9 - 10.3 mg/dL ) 3.5(H) 2.9(J)     BP (!) 164/90   Pulse 87   Temp 98.96 F (37.2 C)   Resp 17   Ht 5\' 4"  (1.626 m)   Wt 72.4 kg   SpO2 96%   BMI 27.40 kg/m    I/O last 3 completed shifts: In: 3430.8 [I.V.:1923.8; Other:50; 2.4(Q; IV Piggyback:88.5] Out: 6105 [Urine:5575; Emesis/NG output:30; Stool:500] Total I/O In: 1044.7 [I.V.:365.7; NG/GT:679] Out: 950 [Urine:950]  SpO2: 96 % O2 Flow Rate (L/min): 15 L/min FiO2 (%): 35 %  Estimated body mass index is 27.4 kg/m as calculated from the following:   Height as of this encounter: 5\' 4"  (1.626 m).   Weight as of this encounter: 72.4 kg.  SIGNIFICANT EVENTS   REVIEW OF SYSTEMS  PATIENT IS UNABLE TO PROVIDE COMPLETE REVIEW OF SYSTEMS DUE TO SEVERE CRITICAL ILLNESS    PHYSICAL EXAMINATION: Gen:      No acute distress HEENT:  EOMI, sclera anicteric Neck:     No masses; no thyromegaly, ET tube Lungs:    Clear to auscultation bilaterally; normal respiratory effort CV:  Regular rate and rhythm; no murmurs Abd:      + bowel sounds; soft, non-tender; no palpable masses, no distension Ext:    No edema; adequate peripheral perfusion Skin:      Warm and dry; no rash Neuro: Sedated, unresponsive    MEDICATIONS: I have reviewed all medications and confirmed regimen as documented   CULTURE RESULTS   Recent Results (from the past 240 hour(s))  Blood Culture (routine x 2)     Status: Abnormal   Collection Time: 10/05/2020  7:39 PM   Specimen: BLOOD  Result Value Ref Range Status   Specimen Description   Final    BLOOD LEFT ANTECUBITAL Performed at [facility], [address], [place], [place] [address]    Special Requests   Final    BOTTLES DRAWN  AEROBIC AND ANAEROBIC Blood Culture adequate volume Performed at [facility], [address] [place], [place] [address]    Culture  Setup Time   Final    Organism ID to follow GRAM POSITIVE COCCI AEROBIC BOTTLE ONLY CRITICAL RESULT CALLED TO, READ BACK BY AND VERIFIED WITH: [facility] MERRILL AT 1339 09/25/20 SDR Performed at [facility], [address] [place], [place] [address]    Culture (A)  Final    STAPHYLOCOCCUS HOMINIS THE SIGNIFICANCE OF ISOLATING THIS ORGANISM FROM A SINGLE SET OF BLOOD CULTURES WHEN MULTIPLE SETS ARE DRAWN IS UNCERTAIN. PLEASE NOTIFY THE MICROBIOLOGY DEPARTMENT WITHIN ONE WEEK IF SPECIATION AND SENSITIVITIES ARE REQUIRED. Performed at [facility], [address] [place], [place] [address]    Report Status 09/27/2020 FINAL  Final  Blood Culture ID Panel (Reflexed)     Status: Abnormal   Collection Time: 10/09/2020  7:39 PM  Result Value Ref Range Status   Enterococcus faecalis NOT DETECTED NOT DETECTED Final   Enterococcus Faecium NOT DETECTED NOT DETECTED Final   Listeria monocytogenes NOT DETECTED NOT DETECTED Final   Staphylococcus species DETECTED (A) NOT DETECTED Final    Comment: CRITICAL RESULT CALLED TO, READ BACK BY AND VERIFIED WITH:  KRISTIN  MERRILL AT 1339 09/25/20 SDR    Staphylococcus aureus (BCID) NOT DETECTED NOT DETECTED Final   Staphylococcus epidermidis NOT DETECTED NOT DETECTED Final   Staphylococcus lugdunensis NOT DETECTED NOT DETECTED Final   Streptococcus species NOT DETECTED NOT DETECTED Final   Streptococcus agalactiae NOT DETECTED NOT DETECTED Final   Streptococcus pneumoniae NOT DETECTED NOT DETECTED Final   Streptococcus pyogenes NOT DETECTED NOT DETECTED Final   A.calcoaceticus-baumannii NOT DETECTED NOT DETECTED Final   Bacteroides fragilis NOT DETECTED NOT DETECTED Final   Enterobacterales NOT DETECTED NOT DETECTED Final   Enterobacter cloacae complex NOT DETECTED NOT DETECTED Final    Escherichia coli NOT DETECTED NOT DETECTED Final   Klebsiella aerogenes NOT DETECTED NOT DETECTED Final   Klebsiella oxytoca NOT DETECTED NOT DETECTED Final   Klebsiella pneumoniae NOT DETECTED NOT DETECTED Final   Proteus species NOT DETECTED NOT DETECTED Final   Salmonella species NOT DETECTED NOT DETECTED Final   Serratia marcescens NOT DETECTED NOT DETECTED Final   Haemophilus influenzae NOT DETECTED NOT DETECTED Final   Neisseria meningitidis NOT DETECTED NOT DETECTED Final   Pseudomonas aeruginosa NOT DETECTED NOT DETECTED Final   Stenotrophomonas maltophilia NOT DETECTED NOT DETECTED Final   Candida albicans NOT DETECTED NOT DETECTED Final   Candida auris NOT DETECTED NOT DETECTED Final   Candida glabrata NOT DETECTED NOT DETECTED Final   Candida krusei NOT DETECTED NOT DETECTED Final  Candida parapsilosis NOT DETECTED NOT DETECTED Final   Candida tropicalis NOT DETECTED NOT DETECTED Final   Cryptococcus neoformans/gattii NOT DETECTED NOT DETECTED Final    Comment: Performed at [facility], [address] [place], [place] [address]  Resp Panel by RT-PCR (Flu A&B, Covid) Nasopharyngeal Swab     Status: Abnormal   Collection Time: 2020/09/26  7:40 PM   Specimen: Nasopharyngeal Swab; Nasopharyngeal(NP) swabs in vial transport medium  Result Value Ref Range Status   SARS Coronavirus 2 by RT PCR POSITIVE (A) NEGATIVE Final    Comment: RESULT CALLED TO, READ BACK BY AND VERIFIED WITH: KENDELL SHEETS @2138  ON 09/26/20 SKL (NOTE) SARS-CoV-2 target nucleic acids are DETECTED.  The SARS-CoV-2 RNA is generally detectable in upper respiratory specimens during the acute phase of infection. Positive results are indicative of the presence of the identified virus, but do not rule out bacterial infection or co-infection with other pathogens not detected by the test. Clinical correlation with patient history and other diagnostic information is necessary to determine  patient infection status. The expected result is Negative.  Fact Sheet for Patients: 09/26/20  Fact Sheet for Healthcare Providers: BloggerCourse.com  This test is not yet approved or cleared by the [employer] FDA and  has been authorized for detection and/or diagnosis of SARS-CoV-2 by FDA under an Emergency Use Authorization (EUA).  This EUA will remain in effect (meaning this test can  be used) for the duration of  the COVID-19 declaration under Section 564(b)(1) of the Act, 21 U.S.C. section 360bbb-3(b)(1), unless the authorization is terminated or revoked sooner.     Influenza A by PCR NEGATIVE NEGATIVE Final   Influenza B by PCR NEGATIVE NEGATIVE Final    Comment: (NOTE) The Xpert Xpress SARS-CoV-2/FLU/RSV plus assay is intended as an aid in the diagnosis of influenza from Nasopharyngeal swab specimens and should not be used as a sole basis for treatment. Nasal washings and aspirates are unacceptable for Xpert Xpress SARS-CoV-2/FLU/RSV testing.  Fact Sheet for Patients: [place]  Fact Sheet for Healthcare Providers: BloggerCourse.com  This test is not yet approved or cleared by the [employer] FDA and has been authorized for detection and/or diagnosis of SARS-CoV-2 by FDA under an Emergency Use Authorization (EUA). This EUA will remain in effect (meaning this test can be used) for the duration of the COVID-19 declaration under Section 564(b)(1) of the Act, 21 U.S.C. section 360bbb-3(b)(1), unless the authorization is terminated or revoked.  Performed at [facility], [address] [place], [place]   Blood Culture (routine x 2)     Status: None   Collection Time: 09-26-20  7:44 PM   Specimen: BLOOD  Result Value Ref Range Status   Specimen Description BLOOD BLOOD LEFT HAND  Final   Special Requests   Final    BOTTLES DRAWN  AEROBIC AND ANAEROBIC Blood Culture adequate volume   Culture   Final    NO GROWTH 5 DAYS Performed at [facility], [address], [place], [place]    Report Status 09/29/2020 FINAL  Final  Urine culture     Status: None   Collection Time: 09/25/20  4:07 AM   Specimen: In/Out Cath Urine  Result Value Ref Range Status   Specimen Description   Final    IN/OUT CATH URINE Performed at [facility], [address] [place], [place]    Special Requests   Final    NONE Performed at [facility],  [address] [place], [place] [address]    Culture   Final    NO GROWTH Performed at [facility], [address], [place], [place] [address]    Report Status 09/26/2020 FINAL  Final  MRSA PCR Screening     Status: None   Collection Time: 09/25/20  4:07 AM   Specimen: Nasal Mucosa; Nasopharyngeal  Result Value Ref Range Status   MRSA by PCR NEGATIVE NEGATIVE Final    Comment:        The GeneXpert MRSA Assay (FDA approved for NASAL specimens only), is one component of a comprehensive MRSA colonization surveillance program. It is not intended to diagnose MRSA infection nor to guide or monitor treatment for MRSA infections. Performed at [facility], [address], [place], [place] [address]           IMAGING    DG Chest Port 1 View  Result Date: 10/02/2020 CLINICAL DATA:  Hypoxia. EXAM: PORTABLE CHEST 1 VIEW COMPARISON:  September 29, 2020 FINDINGS: There is stable endotracheal tube, nasogastric tube and left internal jugular venous catheter positioning. Mild to moderate severity multifocal infiltrates are seen throughout the periphery of both lungs. This is increased in severity when compared to the prior study. There is no evidence of a pleural effusion or pneumothorax. The heart size and mediastinal contours are within normal limits. Chronic left-sided rib fractures are seen. IMPRESSION: Mild to moderate  severity bilateral multifocal infiltrates, increased in severity when compared to the prior study. Electronically Signed   By: Aram Candela M.D.   On: 10/02/2020 02:08     Nutrition Status: Nutrition Problem: Inadequate oral intake Etiology: inability to eat Signs/Symptoms: NPO status Interventions: Tube feeding,Prostat,MVI     Indwelling Urinary Catheter continued, requirement due to   Reason to continue Indwelling Urinary Catheter strict Intake/Output monitoring for hemodynamic instability   Central Line/ continued, requirement due to  Reason to continue [employer] Monitoring of central venous pressure or other hemodynamic parameters and poor IV access   Ventilator continued, requirement due to severe respiratory failure   Ventilator Sedation RASS 0 to -2      ASSESSMENT AND PLAN SYNOPSIS 59 year old with ARDS, COVID-19 pneumonia, bilateral DVT and small right upper lobe PE   Acute respiratory failure.   Acute COVID19 pneumonia -Remdesevir antiviral - pharmacy protocol 5 d -vitamin C -zinc -decadron 6mg  IV daily   - utilize external urinary catheter if possible -Self prone if patient can tolerate   for bronchopulmonary hygiene when able -d/c hepatotoxic medications while on remdesevir -supportive care with ICU telemetry monitoring -PT/OT when possible -procalcitonin, CRP and ferritin trending Use intermittent paralytics for vent dyssynchrony. Repeat ABG.  Prone if P/F ratio less than 150 Goal plateau pressure less than 30, goal driving pressure less than 15 Continue remdesivir, steroids Continue baricitinib   NEUROLOGY Acute toxic metabolic encephalopathy, need for sedation Continue fentanyl, propofol. Increase RASS goal to -2-3 as she is dyssynchronous with the vent Intermittent paralytics  SHOCK-SEPSIS Off pressors   CARDIAC ICU monitoring  ID Received ceftriaxone, azithromycin from 12/16- 12/17 1/2 blood cultures are growing staph  hominis.  This is likely a contaminant DC vancomycin as kidney function is getting worse  GI GI PROPHYLAXIS as indicated   DIET-->TF's as tolerated Constipation protocol as indicated   ENDO - will use ICU hypoglycemic\Hyperglycemia protocol if indicated Continue Levemir, SSI   DVT/PE Continue heparin drip  RENAL AKI Creatinine continues to increase. Nephrology consult.  Likely headed towards  dialysis but no acute need to start today  ELECTROLYTES -follow labs as needed -replace as needed -pharmacy consultation and following  GOALS OF CARE Updated friend Kennon RoundsSally and sister Alvira PhilipsGeraldine on 12/19 over telephone.  No answer when I called brother Duane Discussed worsening clinical status, possible need for dialysis  Decision made to change status to limited code with no CPR or shocks in case of cardiac arrest. We will try dialysis on a time-limited basis if needed but no prolonged mechanical or dialysis support.   DVT/GI PRX ordered and assessed TRANSFUSIONS AS NEEDED MONITOR FSBS I Assessed the need for Labs I Assessed the need for Foley I Assessed the need for Central Venous Line Family Discussion when available I Assessed the need for Mobilization I made an Assessment of medications to be adjusted accordingly Safety Risk assessment completed   CASE DISCUSSED IN MULTIDISCIPLINARY ROUNDS WITH ICU TEAM  The patient is critically ill with multiple organ system failure and requires high complexity decision making for assessment and support, frequent evaluation and titration of therapies, advanced monitoring, review of radiographic studies and interpretation of complex data.   Critical Care Time devoted to patient care services, exclusive of separately billable procedures, described in this note is 33 minutes.    Vida RiggerFuad Yuvan Medinger, M.D.  Pulmonary & Critical Care Medicine  [facility]

## 2020-10-02 NOTE — Progress Notes (Signed)
Pt BP has increased over past several hours. Harlon Ditty, NP notified that SBP is now >180    10/02/20 0300  Vitals  Temp 98.24 F (36.8 C)  BP (!) 185/99  MAP (mmHg) 125  Pulse Rate 91  ECG Heart Rate 91  Resp (!) 24  Oxygen Therapy  SpO2 97 %  MEWS Score  MEWS Temp 0  MEWS Systolic 0  MEWS Pulse 0  MEWS RR 1  MEWS LOC 1  MEWS Score 2  MEWS Score Color Yellow

## 2020-10-03 LAB — CBC
HCT: 28.6 % — ABNORMAL LOW (ref 36.0–46.0)
Hemoglobin: 9.3 g/dL — ABNORMAL LOW (ref 12.0–15.0)
MCH: 28.3 pg (ref 26.0–34.0)
MCHC: 32.5 g/dL (ref 30.0–36.0)
MCV: 86.9 fL (ref 80.0–100.0)
Platelets: 285 10*3/uL (ref 150–400)
RBC: 3.29 MIL/uL — ABNORMAL LOW (ref 3.87–5.11)
RDW: 15.4 % (ref 11.5–15.5)
WBC: 9.4 10*3/uL (ref 4.0–10.5)
nRBC: 0 % (ref 0.0–0.2)

## 2020-10-03 LAB — RENAL FUNCTION PANEL
Albumin: 2.9 g/dL — ABNORMAL LOW (ref 3.5–5.0)
Anion gap: 13 (ref 5–15)
BUN: 113 mg/dL — ABNORMAL HIGH (ref 6–20)
CO2: 25 mmol/L (ref 22–32)
Calcium: 8.5 mg/dL — ABNORMAL LOW (ref 8.9–10.3)
Chloride: 112 mmol/L — ABNORMAL HIGH (ref 98–111)
Creatinine, Ser: 3.19 mg/dL — ABNORMAL HIGH (ref 0.44–1.00)
GFR, Estimated: 16 mL/min — ABNORMAL LOW (ref >60)
Glucose, Bld: 192 mg/dL — ABNORMAL HIGH (ref 70–99)
Phosphorus: 5.4 mg/dL — ABNORMAL HIGH (ref 2.5–4.6)
Potassium: 4.2 mmol/L (ref 3.5–5.1)
Sodium: 150 mmol/L — ABNORMAL HIGH (ref 135–145)

## 2020-10-03 LAB — GLUCOSE, CAPILLARY
Glucose-Capillary: 154 mg/dL — ABNORMAL HIGH (ref 70–99)
Glucose-Capillary: 159 mg/dL — ABNORMAL HIGH (ref 70–99)
Glucose-Capillary: 163 mg/dL — ABNORMAL HIGH (ref 70–99)
Glucose-Capillary: 172 mg/dL — ABNORMAL HIGH (ref 70–99)
Glucose-Capillary: 180 mg/dL — ABNORMAL HIGH (ref 70–99)
Glucose-Capillary: 218 mg/dL — ABNORMAL HIGH (ref 70–99)
Glucose-Capillary: 234 mg/dL — ABNORMAL HIGH (ref 70–99)

## 2020-10-03 LAB — HEPARIN LEVEL (UNFRACTIONATED): Heparin Unfractionated: 0.66 IU/mL (ref 0.30–0.70)

## 2020-10-03 LAB — TRIGLYCERIDES: Triglycerides: 151 mg/dL — ABNORMAL HIGH (ref <150)

## 2020-10-03 LAB — C-REACTIVE PROTEIN: CRP: 7 mg/dL — ABNORMAL HIGH (ref <1.0)

## 2020-10-03 MED ORDER — FREE WATER
200.0000 mL | Status: DC
  Administered 2020-10-03 – 2020-10-04 (×9): 200 mL

## 2020-10-03 MED ORDER — HYDRALAZINE HCL 20 MG/ML IJ SOLN
10.0000 mg | Freq: Once | INTRAMUSCULAR | Status: AC
  Administered 2020-10-03: 23:00:00 10 mg via INTRAVENOUS
  Filled 2020-10-03: qty 1

## 2020-10-03 NOTE — Progress Notes (Signed)
[place], [place] 10/03/20  Subjective:   Hospital day # 9  cvs:  no pressors Pulm:vent assisted; Fio2 35%;  Gi: TF ongoing 55 cc/hr Propofol Fentanyl Heparin amiodarone  Renal: 12/24 0701 - 12/25 0700 In: 3952.2 [I.V.:2126.3; KD/TO:6712; IV Piggyback:79.9] Out: 4580 [Urine:3690; Stool:350] Lab Results  Component Value Date   CREATININE 3.19 (H) 10/03/2020   CREATININE 3.58 (H) 10/02/2020   CREATININE 3.89 (H) 10/01/2020   Critically ill appearing  Objective:  Vital signs in last 24 hours:  Temp:  [96.26 F (35.7 C)-99.32 F (37.4 C)] 98.78 F (37.1 C) (12/25 1100) Pulse Rate:  [65-104] 95 (12/25 1100) Resp:  [11-24] 21 (12/25 1100) BP: (146-210)/(79-105) 179/99 (12/25 1100) SpO2:  [91 %-97 %] 91 % (12/25 1100) FiO2 (%):  [35 %] 35 % (12/25 0830) Weight:  [69.5 kg] 69.5 kg (12/25 0458)  Weight change: -2.9 kg Filed Weights   10/01/20 0312 10/02/20 0500 10/03/20 0458  Weight: 74.3 kg 72.4 kg 69.5 kg    Intake/Output:    Intake/Output Summary (Last 24 hours) at 10/03/2020 1222 Last data filed at 10/03/2020 1212 Gross per 24 hour  Intake 3249.6 ml  Output 4050 ml  Net -800.4 ml    Physical Exam: General:  No acute distress, laying in the bed, critically ill  HEENT  ETT in place, OGT  Pulm/lungs  vent assisted  CVS/Heart  regular rhythm,   Abdomen:   Soft,   Extremities:  + peripheral edema  Neurologic:  sedated  Skin:  No acute rashes  Foley in place Rectal tube   Basic Metabolic Panel:  Recent Labs  Lab 09/28/20 0322 09/29/20 0621 09/30/20 0720 10/01/20 0626 10/01/20 1723 10/02/20 0418 10/02/20 0812 10/03/20 0502  NA 139 143 145 149* 145 149*  --  150*  K 4.2 3.9 4.0 4.4 4.7 4.0  --  4.2  CL 106 109 109 112* 109 111  --  112*  CO2 21* 19* 21* _0 --  25  GLUCOSE 186* 161* 152* 126* 257* 119*  --  192*  BUN 86* 100* 105* 116* 113* 120*  --  113*  CREATININE 3.31* 4.06* 4.08* 3.90* 3.89* 3.58*  --   3.19*  CALCIUM 8.0* 7.8* 8.3* 8.5* 8.7* 8.7*  --  8.5*  MG 2.7* 2.8*  --  2.7* 2.6*  --  2.4  --   PHOS 8.8* 7.1* 6.4* 6.7*  --  5.7*  --  5.4*     CBC: Recent Labs  Lab 09/29/20 0621 09/30/20 0801 10/01/20 0626 10/02/20 0418 10/03/20 0502  WBC 10.3 13.0* 9.7 11.7* 9.4  HGB 10.7* 10.6* 10.0* 10.1* 9.3*  HCT 32.7* 31.1* 29.7* 29.5* 28.6*  MCV 89.1 86.4 86.1 84.8 86.9  PLT 331 330 307 331 285      Lab Results  Component Value Date   HEPBSAG NON REACTIVE 09/27/2020      Microbiology:  Recent Results (from the past 240 hour(s))  Blood Culture (routine x 2)     Status: Abnormal   Collection Time: 09/25/2020  7:39 PM   Specimen: BLOOD  Result Value Ref Range Status   Specimen Description   Final    BLOOD LEFT ANTECUBITAL Performed at [facility], [address], [place], [place] [address]    Special Requests   Final    BOTTLES DRAWN AEROBIC AND ANAEROBIC Blood Culture adequate volume Performed at [facility], [address], [place], [place] [address]    Culture  Setup Time   Final    Organism ID to follow GRAM POSITIVE COCCI AEROBIC BOTTLE ONLY CRITICAL RESULT CALLED TO, READ BACK BY AND VERIFIED WITH: Chinita Greenland AT [id number] 09/25/20 Jackson Performed at [facility], [address] [place], [place] [address]    Culture (A)  Final    STAPHYLOCOCCUS HOMINIS THE SIGNIFICANCE OF ISOLATING THIS ORGANISM FROM A SINGLE SET OF BLOOD CULTURES WHEN MULTIPLE SETS ARE DRAWN IS UNCERTAIN. PLEASE NOTIFY THE MICROBIOLOGY DEPARTMENT WITHIN ONE WEEK IF SPECIATION AND SENSITIVITIES ARE REQUIRED. Performed at [facility], [place] [address] [place], [place] [address]    Report Status 09/27/2020 FINAL  Final  Blood Culture ID Panel (Reflexed)     Status: Abnormal   Collection Time: 10/09/2020  7:39 PM  Result Value Ref Range Status   Enterococcus faecalis NOT DETECTED NOT DETECTED Final   Enterococcus Faecium NOT DETECTED NOT DETECTED Final    Listeria monocytogenes NOT DETECTED NOT DETECTED Final   Staphylococcus species DETECTED (A) NOT DETECTED Final    Comment: CRITICAL RESULT CALLED TO, READ BACK BY AND VERIFIED WITH:  KRISTIN  MERRILL AT [id number] 09/25/20 SDR    Staphylococcus aureus (BCID) NOT DETECTED NOT DETECTED Final   Staphylococcus epidermidis NOT DETECTED NOT DETECTED Final   Staphylococcus lugdunensis NOT DETECTED NOT DETECTED Final   Streptococcus species NOT DETECTED NOT DETECTED Final   Streptococcus agalactiae NOT DETECTED NOT DETECTED Final   Streptococcus pneumoniae NOT DETECTED NOT DETECTED Final   Streptococcus pyogenes NOT DETECTED NOT DETECTED Final   A.calcoaceticus-baumannii NOT DETECTED NOT DETECTED Final   Bacteroides fragilis NOT DETECTED NOT DETECTED Final   Enterobacterales NOT DETECTED NOT DETECTED Final   Enterobacter cloacae complex NOT DETECTED NOT DETECTED Final   Escherichia coli NOT DETECTED NOT DETECTED Final   Klebsiella aerogenes NOT DETECTED NOT DETECTED Final   Klebsiella oxytoca NOT DETECTED NOT DETECTED Final   Klebsiella pneumoniae NOT DETECTED NOT DETECTED Final   Proteus species NOT DETECTED NOT DETECTED Final   Salmonella species NOT DETECTED NOT DETECTED Final   Serratia marcescens NOT DETECTED NOT DETECTED Final   Haemophilus influenzae NOT DETECTED NOT DETECTED Final   Neisseria meningitidis NOT DETECTED NOT DETECTED Final   Pseudomonas aeruginosa NOT DETECTED NOT DETECTED Final   Stenotrophomonas maltophilia NOT DETECTED NOT DETECTED Final   Candida albicans NOT DETECTED NOT DETECTED Final   Candida auris NOT DETECTED NOT DETECTED Final   Candida glabrata NOT DETECTED NOT DETECTED Final   Candida krusei NOT DETECTED NOT DETECTED Final   Candida parapsilosis NOT DETECTED NOT DETECTED Final   Candida tropicalis NOT DETECTED NOT DETECTED Final   Cryptococcus neoformans/gattii NOT DETECTED NOT DETECTED Final    Comment: Performed at [facility], [address] [place], [place] [address]  Resp Panel by RT-PCR (Flu A&B, Covid) Nasopharyngeal Swab     Status: Abnormal   Collection Time: 10/06/2020  7:40 PM   Specimen: Nasopharyngeal Swab; Nasopharyngeal(NP) swabs in vial transport medium  Result Value Ref Range Status   SARS Coronavirus 2 by RT PCR POSITIVE (A) NEGATIVE Final    Comment: RESULT CALLED TO, READ BACK BY AND VERIFIED WITH: KENDELL SHEETS _0  ON 10/04/2020 SKL (NOTE) SARS-CoV-2 target nucleic acids are DETECTED.  The SARS-CoV-2 RNA is generally detectable in upper respiratory specimens during the acute phase of infection. Positive results are indicative of the presence of the identified virus, but do not rule out bacterial infection or co-infection with other pathogens not detected by  the test. Clinical correlation with patient history and other diagnostic information is necessary to determine patient infection status. The expected result is Negative.  Fact Sheet for Patients: EntrepreneurPulse.com.au  Fact Sheet for Healthcare Providers: IncredibleEmployment.be  This test is not yet approved or cleared by the [place] FDA and  has been authorized for detection and/or diagnosis of SARS-CoV-2 by FDA under an Emergency Use Authorization (EUA).  This EUA will remain in effect (meaning this test can  be used) for the duration of  the COVID-19 declaration under Section 564(b)(1) of the Act, 21 U.S.C. section 360bbb-3(b)(1), unless the authorization is terminated or revoked sooner.     Influenza A by PCR NEGATIVE NEGATIVE Final   Influenza B by PCR NEGATIVE NEGATIVE Final    Comment: (NOTE) The Xpert Xpress SARS-CoV-2/FLU/RSV plus assay is intended as an aid in the diagnosis of influenza from Nasopharyngeal swab specimens and should not be used as a sole basis for treatment. Nasal washings and aspirates are unacceptable for Xpert Xpress SARS-CoV-2/FLU/RSV testing.  Fact Sheet for  Patients: EntrepreneurPulse.com.au  Fact Sheet for Healthcare Providers: IncredibleEmployment.be  This test is not yet approved or cleared by the [place] FDA and has been authorized for detection and/or diagnosis of SARS-CoV-2 by FDA under an Emergency Use Authorization (EUA). This EUA will remain in effect (meaning this test can be used) for the duration of the COVID-19 declaration under Section 564(b)(1) of the Act, 21 U.S.C. section 360bbb-3(b)(1), unless the authorization is terminated or revoked.  Performed at [facility], [address] [place], [place] [address]   Blood Culture (routine x 2)     Status: None   Collection Time: 09/29/2020  7:44 PM   Specimen: BLOOD  Result Value Ref Range Status   Specimen Description BLOOD BLOOD LEFT HAND  Final   Special Requests   Final    BOTTLES DRAWN AEROBIC AND ANAEROBIC Blood Culture adequate volume   Culture   Final    NO GROWTH 5 DAYS Performed at [facility], [address], [place], [place] [address]    Report Status 09/29/2020 FINAL  Final  Urine culture     Status: None   Collection Time: 09/25/20  4:07 AM   Specimen: In/Out Cath Urine  Result Value Ref Range Status   Specimen Description   Final    IN/OUT CATH URINE Performed at [facility], [address] [place], [place] [address]    Special Requests   Final    NONE Performed at [facility], [address], [place], [place] [address]    Culture   Final    NO GROWTH Performed at [facility], [place] [address] [place], [place] [address]    Report Status 09/26/2020 FINAL  Final  MRSA PCR Screening     Status: None   Collection Time: 09/25/20  4:07 AM   Specimen: Nasal Mucosa; Nasopharyngeal  Result Value Ref Range Status   MRSA by PCR NEGATIVE NEGATIVE Final    Comment:        The GeneXpert MRSA Assay (FDA approved for NASAL specimens only), is one component of  a comprehensive MRSA colonization surveillance program. It is not intended to diagnose MRSA infection nor to guide or monitor treatment for MRSA infections. Performed at [facility], [address] [place], [place] [address]     Coagulation Studies: No results for input(s): LABPROT, INR in the last 72 hours.  Urinalysis: No results for input(s): COLORURINE,  LABSPEC, PHURINE, GLUCOSEU, HGBUR, BILIRUBINUR, KETONESUR, PROTEINUR, UROBILINOGEN, NITRITE, LEUKOCYTESUR in the last 72 hours.  Invalid input(s): APPERANCEUR    Imaging: DG Chest Port 1 View  Result Date: 10/02/2020 CLINICAL DATA:  Hypoxia. EXAM: PORTABLE CHEST 1 VIEW COMPARISON:  September 29, 2020 FINDINGS: There is stable endotracheal tube, nasogastric tube and left internal jugular venous catheter positioning. Mild to moderate severity multifocal infiltrates are seen throughout the periphery of both lungs. This is increased in severity when compared to the prior study. There is no evidence of a pleural effusion or pneumothorax. The heart size and mediastinal contours are within normal limits. Chronic left-sided rib fractures are seen. IMPRESSION: Mild to moderate severity bilateral multifocal infiltrates, increased in severity when compared to the prior study. Electronically Signed   By: Virgina Norfolk M.D.   On: 10/02/2020 02:08     Medications:   . sodium chloride 150 mL/hr at 09/26/20 2000  . amiodarone 30 mg/hr (10/03/20 1212)  . dexmedetomidine (PRECEDEX) IV infusion 1.2 mcg/kg/hr (10/03/20 1212)  . feeding supplement (VITAL 1.5 CAL) 1,000 mL (10/03/20 1201)  . fentaNYL infusion INTRAVENOUS 350 mcg/hr (10/03/20 1212)  . heparin 1,050 Units/hr (10/03/20 1212)  . midazolam 1.5 mg/hr (10/03/20 1212)  . propofol (DIPRIVAN) infusion Stopped (10/01/20 1235)   . vitamin C  500 mg Per Tube Daily  . chlorhexidine gluconate (MEDLINE KIT)  15 mL Mouth Rinse BID  . Chlorhexidine Gluconate Cloth  6 each Topical  Daily  . feeding supplement (PROSource TF)  45 mL Per Tube Daily  . folic acid  1 mg Per Tube Daily  . furosemide  20 mg Intravenous Daily  . insulin aspart  0-15 Units Subcutaneous Q4H  . insulin detemir  5 Units Subcutaneous BID  . mouth rinse  15 mL Mouth Rinse 10 times per day  . metoprolol tartrate  25 mg Oral BID  . multivitamin with minerals  1 tablet Per Tube Daily  . pantoprazole (PROTONIX) IV  40 mg Intravenous Q24H  . predniSONE  50 mg Per Tube Daily  . thiamine  100 mg Per Tube Daily  . zinc sulfate  220 mg Per Tube Daily   acetaminophen, fentaNYL, heparin, labetalol, midazolam, morphine injection, ondansetron **OR** ondansetron (ZOFRAN) IV  Assessment/ Plan:  59 y.o. female with  hypertension, anxiety, anemia who was brought to the ER on December 16 with a chief complaint of cough shortness of breath   admitted on 09/28/2020 for Edema [R60.9] Lactic acid acidosis [E87.2] Acute respiratory failure with hypoxia (HCC) [J96.01] Pneumonia due to COVID-19 virus [U07.1, J12.82]   # Acute Kidney injury with proteinuria AKI likely secondary to ATN and iv contrast exposure (12/172021) 12/24 0701 - 12/25 0700 In: 3952.2 [I.V.:2126.3; PF/XT:0240; IV Piggyback:79.9] Out: 9735 [HGDJM:4268; Stool:350]   Lab Results  Component Value Date   CREATININE 3.19 (H) 10/03/2020   CREATININE 3.58 (H) 10/02/2020   CREATININE 3.89 (H) 10/01/2020    S Creatinine continues to improve Good UOP .  Almost 3700 cc last 24 hrs Dialysis catheter is in place-- but has not required HD We will discontinue IV Lasix as spontaneous diuresis is expected.  Can use as needed No acute indication for dialysis today.  We will continue to monitor closely on a daily basis  #Hypernatremia Due to brisk diuresis Added free water to tube feeds  # Covid 19 pneumonia Complicated by Acute resp failure with hypoxia Small pulmonary embolism right upper lobe pulmonary arteries in a distal pulmonary artery-  requiring iv anticoagulation  #  hyperphosphatemia Lab Results  Component Value Date   PTH 109 (H) 09/29/2020   CALCIUM 8.5 (L) 10/03/2020   PHOS 5.4 (H) 10/03/2020    CK normal Phos improving     LOS: 9   12/25/202112:22 PM  [place], [place]  Note: This note was prepared with Dragon dictation. Any transcription errors are unintentional

## 2020-10-03 NOTE — Consult Note (Signed)
ANTICOAGULATION CONSULT NOTE   Pharmacy Consult for Heparin Indication: DVT/PE  Patient Measurements: Heparin Dosing Weight: 63.5 kg  Labs: Recent Labs    10/01/20 0626 10/01/20 1706 10/01/20 1723 10/02/20 0049 10/02/20 0418 10/02/20 0812 10/02/20 1013 10/03/20 0502  HGB 10.0*  --   --   --  10.1*  --   --  9.3*  HCT 29.7*  --   --   --  29.5*  --   --  28.6*  PLT 307  --   --   --  331  --   --  285  HEPARINUNFRC 0.10* 0.59  --  0.42  --   --   --  0.66  CREATININE 3.90*  --  3.89*  --  3.58*  --   --  3.19*  TROPONINIHS  --   --   --   --   --  30* 34*  --    Estimated Creatinine Clearance: 17.9 mL/min (A) (by C-G formula based on SCr of 3.19 mg/dL (H)).  Medical History: Past Medical History:  Diagnosis Date  . Anemia    noted during hip surgery   . Anxiety    states no longer issue  . Frequent headaches   . Hypertension   . Lumbago   . 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 Patient was admitted for acute hypoxic respiratory failure 2/2 PNA due to COVID-19. Pharmacy consulted for heparin dosing and monitoring for ACS and pulmonary embolism. CT chest with small pulmonary embolism right upper lobe. Trop elevated 260. CBC stable. No DOAC PTA.   Goal of Therapy:  Heparin level 0.3-0.7 units/ml Monitor platelets by anticoagulation protocol: Yes   12/17 0621 HL = 0.2, bolus 1000 units, increased rate to 900 units/hr  12/17 1802 HL = 0.26, bolus 900 units, increased rate to 1000 units/hr 12/18 0057 HL = 0.29, bolus 900 units, increased rate to 1100 units/hr 12/18 0814 HL = 0.42 x1, rate continued at 1100 units/hr 12/18 1500 HL = 0.40 x2 12/19 0511 HL = 0.58, therapeutic X 3  12/20 0322 HL = 1.84 , supratherapeutic -1100 12/20 0610 HL = 2.0     supratherapeutic-850 12/21 0621 HL = 0.36 therapeutic x 1 12/21 2336 HL = 0.23,  subtherapeutic 12/22 0801 HL = 0.25, subtherapeutic 12/22 1643 HL = 0.37, therapeutic x 1 12/22 2339 HL = 0.31, therapeutic x 2 12/23 0626 HL = 0.10, subtherapeutic 12/23 1706 HL = 0.59, therapeutic x1 12/24 0049 HL = 0.42, therapeutic x 2 12/25 0502 HL = 0.66, therapeutic x 3, H/H slightly worse, PLTs ok   Plan:  HL therapeutic, continue current heparin drip rate at 1050 units/hr Re-check HL daily w/ CBC Daily CBC per protocol while on heparin infusion  Wayland Denis, PharmD 10/03/2020 6:21 AM

## 2020-10-03 NOTE — Progress Notes (Signed)
[facility] Cardiology [facility] Encounter Note  Patient: Margaret Shepard / Admit Date: 10/02/2020 / Date of Encounter: 10/03/2020, 7:06 AM   Subjective: 12/25.  No further evidence by telemetry of episodes of supraventricular tachycardia.  Previous tachycardia appears to have been atrial flutter with rapid ventricular rate with some periods of narrow complex and a 2-minute of wide-complex ventricular response.  No evidence of hemodynamic abnormalities during these events.  Patient was placed on amiodarone for the possibility of other rhythm disturbances and none have been seen after review today.  Patient had increase in beta-blocker use as well which may help.  Patient has had troponin levels of 260/504/713/773 most consistent with demand ischemia hypoxia and current illness with injury rather than acute coronary syndrome.  Echocardiogram showing normal LV systolic function with ejection fraction of 60% with moderate right ventricular and right atrial enlargement  Review of Systems: Cannot assess Objective: Telemetry: Sinus tachycardia Physical Exam: Blood pressure (!) 171/91, pulse 78, temperature (!) 96.8 F (36 C), resp. rate 15, height _0  (1.626 m), weight 69.5 kg, SpO2 94 %. Body mass index is 26.3 kg/m.     Intake/Output Summary (Last 24 hours) at 10/03/2020 0706 Last data filed at 10/03/2020 0600 Gross per 24 hour  Intake 3952.16 ml  Output 4040 ml  Net -87.84 ml    Inpatient Medications:   vitamin C  500 mg Per Tube Daily   chlorhexidine gluconate (MEDLINE KIT)  15 mL Mouth Rinse BID   Chlorhexidine Gluconate Cloth  6 each Topical Daily   feeding supplement (PROSource TF)  45 mL Per Tube Daily   folic acid  1 mg Per Tube Daily   furosemide  20 mg Intravenous Daily   insulin aspart  0-15 Units Subcutaneous Q4H   insulin detemir  5 Units Subcutaneous BID   mouth rinse  15 mL Mouth Rinse 10 times per day   metoprolol tartrate  25 mg Oral BID    multivitamin with minerals  1 tablet Per Tube Daily   pantoprazole (PROTONIX) IV  40 mg Intravenous Q24H   predniSONE  50 mg Per Tube Daily   thiamine  100 mg Per Tube Daily   zinc sulfate  220 mg Per Tube Daily   Infusions:   sodium chloride 150 mL/hr at 09/26/20 2000   amiodarone 30 mg/hr (10/03/20 0600)   dexmedetomidine (PRECEDEX) IV infusion 1.2 mcg/kg/hr (10/03/20 0600)   feeding supplement (VITAL 1.5 CAL) 55 mL/hr at 10/03/20 0600   fentaNYL infusion INTRAVENOUS 350 mcg/hr (10/03/20 0600)   heparin 1,050 Units/hr (10/03/20 0600)   midazolam Stopped (10/02/20 0512)   propofol (DIPRIVAN) infusion Stopped (10/01/20 1235)    Labs: Recent Labs    10/01/20 1723 10/02/20 0418 10/02/20 0812 10/03/20 0502  NA 145 149*  --  150*  K 4.7 4.0  --  4.2  CL 109 111  --  112*  CO2 22 24  --  25  GLUCOSE 257* 119*  --  192*  BUN 113* 120*  --  113*  CREATININE 3.89* 3.58*  --  3.19*  CALCIUM 8.7* 8.7*  --  8.5*  MG 2.6*  --  2.4  --   PHOS  --  5.7*  --  5.4*   Recent Labs    10/02/20 0418 10/03/20 0502  ALBUMIN 2.7* 2.9*   Recent Labs    10/02/20 0418 10/03/20 0502  WBC 11.7* 9.4  HGB 10.1* 9.3*  HCT 29.5* 28.6*  MCV 84.8 86.9  PLT  331 285   No results for input(s): CKTOTAL, CKMB, TROPONINI in the last 72 hours. Invalid input(s): POCBNP No results for input(s): HGBA1C in the last 72 hours.   Weights: Filed Weights   10/01/20 0312 10/02/20 0500 10/03/20 0458  Weight: 74.3 kg 72.4 kg 69.5 kg     Radiology/Studies:  DG Abd 1 View  Result Date: 09/25/2020 CLINICAL DATA:  Tube placement EXAM: ABDOMEN - 1 VIEW COMPARISON:  None. FINDINGS: NG tube tip is in the distal stomach. Bilateral airspace disease again noted. IMPRESSION: NG tube tip in the distal stomach. Electronically Signed   By: Rolm Baptise M.D.   On: 09/25/2020 03:54   CT ANGIO CHEST PE W OR WO CONTRAST  Result Date: 09/25/2020 CLINICAL DATA:  Acute hypoxic respiratory failure secondary  to COVID-19 EXAM: CT ANGIOGRAPHY CHEST WITH CONTRAST TECHNIQUE: Multidetector CT imaging of the chest was performed using the standard protocol during bolus administration of intravenous contrast. Multiplanar CT image reconstructions and MIPs were obtained to evaluate the vascular anatomy. CONTRAST:  30m OMNIPAQUE IOHEXOL 350 MG/ML SOLN COMPARISON:  Portable chest 09/26/2019 FINDINGS: Cardiovascular: Small pulmonary embolism right upper lobe pulmonary arteries in a distal pulmonary artery. Two small branch pulmonary emboli right upper lobe. No central pulmonary embolism. Pulmonary arteries normal in caliber. Pulmonary arteries normal in caliber. Negative thoracic aorta. Heart size within normal limits. Minimal pericardial thickening. Mediastinum/Nodes: Endotracheal tube in good position. NG tube in the stomach. No mediastinal mass or hematoma. Lungs/Pleura: Extensive diffuse bilateral airspace disease compatible with COVID pneumonia. Bibasilar atelectasis. No significant pleural effusion. Upper Abdomen: No acute abnormality. Musculoskeletal: No acute skeletal abnormality. Review of the MIP images confirms the above findings. IMPRESSION: Small pulmonary embolism right upper lobe. Diffuse bilateral airspace disease compatible with COVID pneumonia. Bibasilar atelectasis. These results will be called to the ordering clinician or representative by the Radiologist Assistant, and communication documented in the PACS or [employer] Electronically Signed   By: CFranchot GalloM.D.   On: 09/25/2020 12:47   [place]  Result Date: 09/27/2020 CLINICAL DATA:  ATN EXAM: RENAL / URINARY TRACT ULTRASOUND COMPLETE COMPARISON:  None. FINDINGS: Right Kidney: Renal measurements: 1.3 x 6.8 x 5.7 cm = volume: 232 mL. Diffusely increased echogenicity. No hydronephrosis. Left Kidney: Renal measurements: 10.7 x 5.2 x 4.8 cm = volume: 140 mL. Diffusely increased echogenicity. No hydronephrosis. Bladder: Bladder is completely  decompressed and is not visualized. Other: None. IMPRESSION: 1. Increased echogenicity of bilateral kidneys as can be seen in medical renal disease. No hydronephrosis. Electronically Signed   By: SValentino SaxonMD   On: 09/27/2020 14:54   [place]ous Img Lower Bilateral (DVT)  Addendum Date: 09/25/2020   ADDENDUM REPORT: 09/25/2020 07:27 ADDENDUM: Study discussed by telephone with Dr. CJenell Millinerin the ICU on 09/25/2020 at 0724 hours. Electronically Signed   By: HGenevie AnnM.D.   On: 09/25/2020 07:27   Result Date: 09/25/2020 CLINICAL DATA:  59year old female with COVID-19. Bilateral lower extremity edema. EXAM: BILATERAL LOWER EXTREMITY VENOUS DOPPLER ULTRASOUND TECHNIQUE: Gray-scale sonography with graded compression, as well as color Doppler and duplex ultrasound were performed to evaluate the lower extremity deep venous systems from the level of the common femoral vein and including the common femoral, femoral, profunda femoral, popliteal and calf veins including the posterior tibial, peroneal and gastrocnemius veins when visible. The superficial great saphenous vein was also interrogated. Spectral Doppler was utilized to evaluate flow at rest and with distal augmentation maneuvers in the common femoral, femoral and  popliteal veins. COMPARISON:  No prior ultrasound. FINDINGS: RIGHT LOWER EXTREMITY Common Femoral Vein: No evidence of thrombus. Normal compressibility, respiratory phasicity and response to augmentation. Saphenofemoral Junction: No evidence of thrombus. Normal compressibility and flow on color Doppler imaging. Profunda Femoral Vein: No evidence of thrombus. Normal compressibility and flow on color Doppler imaging. Femoral Vein: No evidence of thrombus. Normal compressibility, respiratory phasicity and response to augmentation. Popliteal Vein: The proximal popliteal vein appears compressible and patent (image 6), but there is echogenic thrombus in non compressible distal popliteal vein (image 9)  and continuing into the calf veins. See image 27. Calf Veins: Thrombus.  See image 31. Other Findings:  None. LEFT LOWER EXTREMITY Common Femoral Vein: Echogenic thrombus in noncompressible vessel (image 32). Saphenofemoral Junction: Appears to remain patent (image 37). Profunda Femoral Vein: Appears to remain patent (image 41). Femoral Vein: Thrombus in the proximal and mid left femoral vein. The distal femoral vein appears to remain patent. Popliteal Vein: Incompressible with echogenic thrombus (images 57-59). Thrombus extends into the calf veins (image 61). Calf Veins: Thrombus.  See image 64. Other Findings:  None. IMPRESSION: Positive for BILATERAL lower extremity DVT, more extensive on the left. Electronically Signed: By: Genevie Ann M.D. On: 09/25/2020 06:59   DG Chest Port 1 View  Result Date: 10/02/2020 CLINICAL DATA:  Hypoxia. EXAM: PORTABLE CHEST 1 VIEW COMPARISON:  September 29, 2020 FINDINGS: There is stable endotracheal tube, nasogastric tube and left internal jugular venous catheter positioning. Mild to moderate severity multifocal infiltrates are seen throughout the periphery of both lungs. This is increased in severity when compared to the prior study. There is no evidence of a pleural effusion or pneumothorax. The heart size and mediastinal contours are within normal limits. Chronic left-sided rib fractures are seen. IMPRESSION: Mild to moderate severity bilateral multifocal infiltrates, increased in severity when compared to the prior study. Electronically Signed   By: Virgina Norfolk M.D.   On: 10/02/2020 02:08   DG Chest Port 1 View  Result Date: 09/29/2020 CLINICAL DATA:  Hypoxia. Central catheter placement. COVID-19 positive. EXAM: PORTABLE CHEST 1 VIEW COMPARISON:  September 28, 2020 FINDINGS: Central catheter tip is in the superior vena cava. Endotracheal tube tip is 4.7 cm above the carina. Nasogastric tube tip and side port are in the stomach. No pneumothorax. There is mild  atelectasis in the right mid lung. Lungs elsewhere clear. Heart size and pulmonary vascularity are normal. Healed fractures of the left sixth and seventh ribs noted. IMPRESSION: Tube and catheter positions as described without pneumothorax. Slight right midlung atelectasis. Lungs elsewhere clear. Cardiac silhouette normal. Electronically Signed   By: Lowella Grip III M.D.   On: 09/29/2020 13:46   DG Chest Port 1 View  Result Date: 09/28/2020 CLINICAL DATA:  Respiratory failure. EXAM: PORTABLE CHEST 1 VIEW COMPARISON:  09/27/2020. FINDINGS: Endotracheal tube and NG tube in stable position. Heart size normal. Diffuse bilateral interstitial prominence again noted without interim change. Tiny bilateral pleural effusions can not be excluded. No pneumothorax. IMPRESSION: 1. Lines and tubes in stable position. 2. Diffuse bilateral interstitial prominence again noted without interim change. Tiny bilateral pleural effusions can not be excluded. Electronically Signed   By: Marcello Moores  Register   On: 09/28/2020 06:45   DG Chest Port 1 View  Result Date: 09/27/2020 CLINICAL DATA:  ARDS. EXAM: PORTABLE CHEST 1 VIEW COMPARISON:  September 27, 2020 FINDINGS: The endotracheal tube terminates above the carina. The enteric tube extends below the left hemidiaphragm. The heart size is stable.  There are bilateral pleural effusions and bilateral airspace opacities, similar to prior study. There is no pneumothorax. There are old healed left-sided rib fractures. IMPRESSION: 1. Lines and tubes as above. 2. No significant interval change. Electronically Signed   By: Constance Holster M.D.   On: 09/27/2020 21:04   DG Chest Port 1 View  Result Date: 09/27/2020 CLINICAL DATA:  Acute respiratory failure.  History of hypertension. EXAM: PORTABLE CHEST 1 VIEW COMPARISON:  September 26, 2020 FINDINGS: The ETT is in good position. The NG tube terminates below today's film. No pneumothorax. Bibasilar infiltrates are mildly more  prominent the interval. No other interval changes. IMPRESSION: 1. Support apparatus as above. 2. Bibasilar infiltrates, mildly more prominent in the interval. Electronically Signed   By: Dorise Bullion III M.D   On: 09/27/2020 09:05   DG Chest Port 1 View  Result Date: 09/26/2020 CLINICAL DATA:  ETT EXAM: PORTABLE CHEST 1 VIEW COMPARISON:  September 25, 2020 FINDINGS: The ETT is in good position. The OG tube terminates below today's film. No pneumothorax. Bilateral pulmonary infiltrates persist, improved in the interval. The cardiomediastinal silhouette is stable. IMPRESSION: 1. Support apparatus as above. 2. Infiltrate remains in the right base, improved in the interval. Electronically Signed   By: Dorise Bullion III M.D   On: 09/26/2020 08:29   DG Chest Port 1 View  Result Date: 09/25/2020 CLINICAL DATA:  30-year-old female with COVID-19 and bilateral lower extremity DVT. Intubated. EXAM: PORTABLE CHEST 1 VIEW COMPARISON:  Portable chest 0340 hours today and earlier. FINDINGS: Portable AP upright view at 0654 hours. Endotracheal tube tip in good position just below the clavicles. Enteric tube courses to the abdomen with side hole visible at the gastric body. Stable lung volumes. Mediastinal contours remain within normal limits. Hazy and confluent left lung and right lower lung opacity. No pneumothorax or pleural effusion. The left lung opacity has progressed since 10/09/2020. Negative visible bowel gas pattern. IMPRESSION: 1. Satisfactory ET tube and enteric tube. 2. Progressed left lung opacity since 09/25/2020. Stable patchy right lower lobe opacity. No pneumothorax or pleural effusion identified. Electronically Signed   By: Genevie Ann M.D.   On: 09/25/2020 07:29   DG Chest Port 1 View  Result Date: 09/25/2020 CLINICAL DATA:  Acute respiratory failure EXAM: PORTABLE CHEST 1 VIEW COMPARISON:  09/23/2020 FINDINGS: Endotracheal to is 4 cm above the carina. NG tube is in the stomach. Patchy bilateral  airspace disease, right greater than left, similar to prior study. Heart is normal size. No effusions or pneumothorax. IMPRESSION: Support devices in expected position as above. Bilateral airspace disease, not significantly changed since prior study. Electronically Signed   By: Rolm Baptise M.D.   On: 09/25/2020 03:54   DG Chest Port 1 View  Result Date: 09/25/2020 CLINICAL DATA:  COVID-19 EXAM: PORTABLE CHEST 1 VIEW COMPARISON:  01/10/2011 FINDINGS: Multifocal consolidation, worst in the lower right lung. No pneumothorax or sizable pleural effusion. Normal cardiomediastinal contours. IMPRESSION: Multifocal pneumonia. Electronically Signed   By: Ulyses Jarred M.D.   On: 10/09/2020 20:15   ECHOCARDIOGRAM COMPLETE  Result Date: 09/26/2020    ECHOCARDIOGRAM REPORT   Patient Name:   Margaret Shepard [facility] Date of Exam: 09/26/2020 Medical Rec #:  [id number]           Height:       64.0 in Accession #:    [id number]          Weight:       142.2 lb Date of  Birth:  [birth year]            BSA:          1.692 m Patient Age:    29 years            BP:           85/61 mmHg Patient Gender: F                   HR:           87 bpm. Exam Location:  [facility] Procedure: 2D Echo Indications:     Acute Respiratory Distress  History:         Patient has no prior history of Echocardiogram examinations.                  Risk Factors:Hypertension.  Sonographer:     Wallace Keller Thornton-Maynard Referring Phys:  [id number] Awilda Bill Diagnosing Phys: Kate Sable MD IMPRESSIONS  1. Left ventricular ejection fraction, by estimation, is 55 to 60%. The left ventricle has normal function. The left ventricle has no regional wall motion abnormalities. Left ventricular diastolic parameters are indeterminate.  2. Right ventricular systolic function is low normal. The right ventricular size is mildly enlarged. There is normal pulmonary artery systolic pressure.  3. Right atrial size was moderately dilated.  4. The mitral valve is normal in  structure. No evidence of mitral valve regurgitation.  5. The aortic valve is grossly normal. Aortic valve regurgitation is not visualized. FINDINGS  Left Ventricle: Left ventricular ejection fraction, by estimation, is 55 to 60%. The left ventricle has normal function. The left ventricle has no regional wall motion abnormalities. The left ventricular internal cavity size was normal in size. There is  no left ventricular hypertrophy. Left ventricular diastolic parameters are indeterminate. Right Ventricle: The right ventricular size is mildly enlarged. No increase in right ventricular wall thickness. Right ventricular systolic function is low normal. There is normal pulmonary artery systolic pressure. The tricuspid regurgitant velocity is 2.49 m/s, and with an assumed right atrial pressure of 3 mmHg, the estimated right ventricular systolic pressure is 64.6 mmHg. Left Atrium: Left atrial size was normal in size. Right Atrium: Right atrial size was moderately dilated. Pericardium: There is no evidence of pericardial effusion. Mitral Valve: The mitral valve is normal in structure. No evidence of mitral valve regurgitation. Tricuspid Valve: The tricuspid valve is normal in structure. Tricuspid valve regurgitation is mild. Aortic Valve: The aortic valve is grossly normal. Aortic valve regurgitation is not visualized. Aortic valve peak gradient measures 7.3 mmHg. Pulmonic Valve: The pulmonic valve was not well visualized. Pulmonic valve regurgitation is not visualized. Aorta: The aortic root is normal in size and structure. Venous: IVC assessment for right atrial pressure unable to be performed due to mechanical ventilation. IAS/Shunts: No atrial level shunt detected by color flow Doppler.  LEFT VENTRICLE PLAX 2D LVIDd:         4.28 cm  Diastology LVIDs:         3.30 cm  LV e' medial:    5.66 cm/s LV PW:         0.98 cm  LV E/e' medial:  10.7 LV IVS:        1.03 cm  LV e' lateral:   7.40 cm/s LVOT diam:     1.80 cm  LV  E/e' lateral: 8.2 LV SV:         39 LV SV Index:   23 LVOT Area:  2.54 cm  RIGHT VENTRICLE RV Basal diam:  4.18 cm RV S prime:     7.29 cm/s TAPSE (M-mode): 1.2 cm LEFT ATRIUM             Index       RIGHT ATRIUM           Index LA diam:        2.70 cm 1.60 cm/m  RA Area:     23.30 cm LA Vol (A2C):   42.7 ml 25.23 ml/m RA Volume:   80.80 ml  47.74 ml/m LA Vol (A4C):   26.8 ml 15.84 ml/m LA Biplane Vol: 33.5 ml 19.79 ml/m  AORTIC VALVE                PULMONIC VALVE AV Area (Vmax): 1.49 cm    PV Vmax:       0.72 m/s AV Vmax:        135.00 cm/s PV Peak grad:  2.1 mmHg AV Peak Grad:   7.3 mmHg LVOT Vmax:      79.00 cm/s LVOT Vmean:     54.600 cm/s LVOT VTI:       0.153 m  AORTA Ao Root diam: 3.00 cm MITRAL VALVE               TRICUSPID VALVE MV Area (PHT): 4.68 cm    TR Peak grad:   24.8 mmHg MV E velocity: 60.40 cm/s  TR Vmax:        249.00 cm/s MV A velocity: 63.40 cm/s MV E/A ratio:  0.95        SHUNTS                            Systemic VTI:  0.15 m                            Systemic Diam: 1.80 cm Kate Sable MD Electronically signed by Kate Sable MD Signature Date/Time: 09/26/2020/11:49:11 AM    Final    [place] EKG SITE RITE  Result Date: 09/25/2020 If Site Rite image not attached, placement could not be confirmed due to current cardiac rhythm.    Assessment and Recommendation  59 y.o. female with acute respiratory failure due to Covid infection with CT showing diffuse infiltrates for Covid and pulmonary embolism status post appropriate medication management with myocardial injury without evidence of acute coronary syndrome right ventricular and right atrial enlargement likely secondary to pulmonary embolism and hypoxia with episodes atrial flutter with rapid ventricular rate nonsustained at this time after treatment with beta-blocker and amiodarone 1.  Okay for discontinuation of amiodarone at this time due to no evidence of further episodes of rhythm disturbances which are likely  secondary to current patient's condition which appears to be slightly improved 2.  Continuation of beta-blocker for maintenance of normal sinus rhythm and reduction of episodes of supraventricular tachycardia 3.  No further cardiac diagnostics necessary at this time 4.  Continue supportive care of kidney injury, infection, hypoxia, respiratory failure, and pulmonary embolism which all will improve cardiovascular status  Signed, Serafina Royals M.D. FACC

## 2020-10-03 NOTE — Plan of Care (Signed)
Pt remains intubated. Pt agitated frequently this am, dyssynchronous with vent, giving versed boluses every 2 hours. After discussing with Dr.Aleskerov, versed gtt started. Pt responded well to low dose infusion, with improved O2 sat and less work of breathing. No ectopy today, amio infusion discontinued per cardiology recommendation and patient has been stable since. SBT for approx 2 hours today; discontinued for agitation, low saturations and increased work of breathing. Continuing to respond well to diuresis.  Problem: Education: Goal: Knowledge of General Education information will improve Description: Including pain rating scale, medication(s)/side effects and non-pharmacologic comfort measures Outcome: Not Progressing   Problem: Health Behavior/Discharge Planning: Goal: Ability to manage health-related needs will improve Outcome: Not Progressing   Problem: Clinical Measurements: Goal: Ability to maintain clinical measurements within normal limits will improve Outcome: Not Progressing Goal: Will remain free from infection Outcome: Not Progressing Goal: Diagnostic test results will improve Outcome: Not Progressing Goal: Respiratory complications will improve Outcome: Not Progressing   Problem: Activity: Goal: Risk for activity intolerance will decrease Outcome: Not Progressing   Problem: Coping: Goal: Level of anxiety will decrease Outcome: Not Progressing   Problem: Pain Managment: Goal: General experience of comfort will improve Outcome: Not Progressing   Problem: Clinical Measurements: Goal: Cardiovascular complication will be avoided Outcome: Progressing   Problem: Nutrition: Goal: Adequate nutrition will be maintained Outcome: Progressing   Problem: Elimination: Goal: Will not experience complications related to bowel motility Outcome: Progressing Goal: Will not experience complications related to urinary retention Outcome: Progressing   Problem: Safety: Goal:  Ability to remain free from injury will improve Outcome: Progressing   Problem: Skin Integrity: Goal: Risk for impaired skin integrity will decrease Outcome: Progressing

## 2020-10-03 NOTE — Progress Notes (Signed)
CRITICAL CARE NOTE  59 yo female admitted with acute hypoxic respiratory failure secondary to pneumonia due to COVID-19 requiring HHFNC and NRB now intubated and severe resp failure complicated by DVT and PE  12/16 intubated 12/17 high risk for cardiac arrest, on pressors 12/18 Off pressors 12/19 worsening renal function.  Nephrology consult 09/28/20- patient was noted to be severely acidemic with HAGMA in am s/p 2 amp bicarb with repeat ABG significantly improved 12/21- patient will require HD, will place vascath today. Meds reviewed and changed, TG elevated on propofol. DCd IVF. 12/22-  Patient remains critically ill. Reviewed plan with nephrologist, patient will not have HD today. 10/01/20- patient on 35%FIo2 8peep, she diruesed >3L we will hold HD, continue diuresis. SBT.  10/02/20- patient with tachyarrythmia overnight.  S/p cardiology evaluation. For SBT today.  10/03/20- patient with no overnight events, remains critically ill on MV.  Diuresed well overnight.  Discussed care plan with nephrologist today.    CC  follow up respiratory failure  SUBJECTIVE FiO2 down to 35% Dyssynchronous with the ventilator Renal function is worse  Vent Mode: PRVC FiO2 (%):  [35 %] 35 % Set Rate:  [20 bmp] 20 bmp Vt Set:  [520 mL] 520 mL PEEP:  [8 cmH20] 8 cmH20 Plateau Pressure:  [15 cmH20-22 cmH20] 22 cmH20  CBC    Component Value Date/Time   WBC 9.4 10/03/2020 0502   RBC 3.29 (L) 10/03/2020 0502   HGB 9.3 (L) 10/03/2020 0502   HGB 13.6 07/08/2012 2019   HCT 28.6 (L) 10/03/2020 0502   HCT 39.4 07/08/2012 2019   PLT 285 10/03/2020 0502   PLT 202 07/08/2012 2019   MCV 86.9 10/03/2020 0502   MCV 91 07/08/2012 2019   MCH 28.3 10/03/2020 0502   MCHC 32.5 10/03/2020 0502   RDW 15.4 10/03/2020 0502   RDW 12.6 07/08/2012 2019   LYMPHSABS 1.3 09/26/2020 0426   MONOABS 0.7 09/26/2020 0426   EOSABS 0.0 09/26/2020 0426   BASOSABS 0.0 09/26/2020 0426   BMP Latest Ref Rng & Units  10/03/2020 10/02/2020 10/01/2020  Glucose 70 - 99 mg/dL 528(U192(H) 132(G119(H) 401(U257(H)  BUN 6 - 20 mg/dL 272(Z113(H) 366(Y120(H) 403(K113(H)  Creatinine 0.44 - 1.00 mg/dL 7.42(V3.19(H) 9.56(L3.58(H) 8.75(I3.89(H)  Sodium 135 - 145 mmol/L 150(H) 149(H) 145  Potassium 3.5 - 5.1 mmol/L 4.2 4.0 4.7  Chloride 98 - 111 mmol/L 112(H) 111 109  CO2 22 - 32 mmol/L 25 24 22   Calcium 8.9 - 10.3 mg/dL 4.3(P8.5(L) 2.9(J8.7(L) 1.8(A8.7(L)     BP (!) 179/99   Pulse 95   Temp 98.78 F (37.1 C)   Resp (!) 21   Ht 5\' 4"  (1.626 m)   Wt 69.5 kg   SpO2 91%   BMI 26.30 kg/m    I/O last 3 completed shifts: In: 5032.5 [I.V.:2838.7; Other:100; NG/GT:2014; IV Piggyback:79.9] Out: 5990 [Urine:5240; Stool:750] Total I/O In: 506.1 [I.V.:506.1] Out: 1335 [Urine:1335]  SpO2: 91 % O2 Flow Rate (L/min): 15 L/min FiO2 (%): 35 %  Estimated body mass index is 26.3 kg/m as calculated from the following:   Height as of this encounter: 5\' 4"  (1.626 m).   Weight as of this encounter: 69.5 kg.  SIGNIFICANT EVENTS   REVIEW OF SYSTEMS  PATIENT IS UNABLE TO PROVIDE COMPLETE REVIEW OF SYSTEMS DUE TO SEVERE CRITICAL ILLNESS    PHYSICAL EXAMINATION: Gen:      No acute distress HEENT:  EOMI, sclera anicteric Neck:     No masses; no thyromegaly, ET tube Lungs:  Rhonchi b/l CV:         Regular rate and rhythm; no murmurs Abd:      + bowel sounds; soft, non-tender; no palpable masses, no distension Ext:    No edema; adequate peripheral perfusion Skin:      Warm and dry; no rash Neuro: Sedated, unresponsive GCS4T    MEDICATIONS: I have reviewed all medications and confirmed regimen as documented   CULTURE RESULTS   Recent Results (from the past 240 hour(s))  Blood Culture (routine x 2)     Status: Abnormal   Collection Time: 09/18/2020  7:39 PM   Specimen: BLOOD  Result Value Ref Range Status   Specimen Description   Final    BLOOD LEFT ANTECUBITAL Performed at [facility], [address], [place], [place] [address]    Special Requests    Final    BOTTLES DRAWN AEROBIC AND ANAEROBIC Blood Culture adequate volume Performed at [facility], [address] [place], [place] [address]    Culture  Setup Time   Final    Organism ID to follow GRAM POSITIVE COCCI AEROBIC BOTTLE ONLY CRITICAL RESULT CALLED TO, READ BACK BY AND VERIFIED WITH: [facility] MERRILL AT 1339 09/25/20 SDR Performed at [facility], [address] [place], [place] [address]    Culture (A)  Final    STAPHYLOCOCCUS HOMINIS THE SIGNIFICANCE OF ISOLATING THIS ORGANISM FROM A SINGLE SET OF BLOOD CULTURES WHEN MULTIPLE SETS ARE DRAWN IS UNCERTAIN. PLEASE NOTIFY THE MICROBIOLOGY DEPARTMENT WITHIN ONE WEEK IF SPECIATION AND SENSITIVITIES ARE REQUIRED. Performed at [facility], [address], [place], [place] [address]    Report Status 09/27/2020 FINAL  Final  Blood Culture ID Panel (Reflexed)     Status: Abnormal   Collection Time: 09/29/2020  7:39 PM  Result Value Ref Range Status   Enterococcus faecalis NOT DETECTED NOT DETECTED Final   Enterococcus Faecium NOT DETECTED NOT DETECTED Final   Listeria monocytogenes NOT DETECTED NOT DETECTED Final   Staphylococcus species DETECTED (A) NOT DETECTED Final    Comment: CRITICAL RESULT CALLED TO, READ BACK BY AND VERIFIED WITH:  KRISTIN  MERRILL AT 1339 09/25/20 SDR    Staphylococcus aureus (BCID) NOT DETECTED NOT DETECTED Final   Staphylococcus epidermidis NOT DETECTED NOT DETECTED Final   Staphylococcus lugdunensis NOT DETECTED NOT DETECTED Final   Streptococcus species NOT DETECTED NOT DETECTED Final   Streptococcus agalactiae NOT DETECTED NOT DETECTED Final   Streptococcus pneumoniae NOT DETECTED NOT DETECTED Final   Streptococcus pyogenes NOT DETECTED NOT DETECTED Final   A.calcoaceticus-baumannii NOT DETECTED NOT DETECTED Final   Bacteroides fragilis NOT DETECTED NOT DETECTED Final   Enterobacterales NOT DETECTED NOT DETECTED Final   Enterobacter cloacae complex NOT DETECTED NOT  DETECTED Final   Escherichia coli NOT DETECTED NOT DETECTED Final   Klebsiella aerogenes NOT DETECTED NOT DETECTED Final   Klebsiella oxytoca NOT DETECTED NOT DETECTED Final   Klebsiella pneumoniae NOT DETECTED NOT DETECTED Final   Proteus species NOT DETECTED NOT DETECTED Final   Salmonella species NOT DETECTED NOT DETECTED Final   Serratia marcescens NOT DETECTED NOT DETECTED Final   Haemophilus influenzae NOT DETECTED NOT DETECTED Final   Neisseria meningitidis NOT DETECTED NOT DETECTED Final   Pseudomonas aeruginosa NOT DETECTED NOT DETECTED Final   Stenotrophomonas maltophilia NOT DETECTED NOT DETECTED Final   Candida albicans NOT DETECTED NOT DETECTED Final   Candida auris NOT DETECTED NOT DETECTED Final   Candida glabrata NOT DETECTED NOT  DETECTED Final   Candida krusei NOT DETECTED NOT DETECTED Final   Candida parapsilosis NOT DETECTED NOT DETECTED Final   Candida tropicalis NOT DETECTED NOT DETECTED Final   Cryptococcus neoformans/gattii NOT DETECTED NOT DETECTED Final    Comment: Performed at [facility], [address] [place], [place] [address]  Resp Panel by RT-PCR (Flu A&B, Covid) Nasopharyngeal Swab     Status: Abnormal   Collection Time: 09/23/2020  7:40 PM   Specimen: Nasopharyngeal Swab; Nasopharyngeal(NP) swabs in vial transport medium  Result Value Ref Range Status   SARS Coronavirus 2 by RT PCR POSITIVE (A) NEGATIVE Final    Comment: RESULT CALLED TO, READ BACK BY AND VERIFIED WITH: KENDELL SHEETS  ON 09/09/2020 SK[facility] (NOTE) SARS-CoV-2 target nucleic acids are DETECTED.  The SARS-CoV-2 RNA is generally detectable in upper respiratory specimens during the acute phase of infection. Positive results are indicative of the presence of the identified virus, but do not rule out bacterial infection or co-infection with other pathogens not detected by the test. Clinical correlation with patient history and other diagnostic information is necessary to  determine patient infection status. The expected result is Negative.  Fact Sheet for Patients: BloggerCourse.com  Fact Sheet for Healthcare Providers: SeriousBroker.it  This test is not yet approved or cleared by the [place] FDA and  has been authorized for detection and/or diagnosis of SARS-CoV-2 by FDA under an Emergency Use Authorization (EUA).  This EUA will remain in effect (meaning this test can  be used) for the duration of  the COVID-19 declaration under Section 564(b)(1) of the Act, 21 U.S.C. section 360bbb-3(b)(1), unless the authorization is terminated or revoked sooner.     Influenza A by PCR NEGATIVE NEGATIVE Final   Influenza B by PCR NEGATIVE NEGATIVE Final    Comment: (NOTE) The Xpert Xpress SARS-CoV-2/FLU/RSV plus assay is intended as an aid in the diagnosis of influenza from Nasopharyngeal swab specimens and should not be used as a sole basis for treatment. Nasal washings and aspirates are unacceptable for Xpert Xpress SARS-CoV-2/FLU/RSV testing.  Fact Sheet for Patients: BloggerCourse.com  Fact Sheet for Healthcare Providers: SeriousBroker.it  This test is not yet approved or cleared by the [place] FDA and has been authorized for detection and/or diagnosis of SARS-CoV-2 by FDA under an Emergency Use Authorization (EUA). This EUA will remain in effect (meaning this test can be used) for the duration of the COVID-19 declaration under Section 564(b)(1) of the Act, 21 U.S.C. section 360bbb-3(b)(1), unless the authorization is terminated or revoked.  Performed at [facility], [address] [place], [place] [address]   Blood Culture (routine x 2)     Status: None   Collection Time: 09/15/2020  7:44 PM   Specimen: BLOOD  Result Value Ref Range Status   Specimen Description BLOOD BLOOD LEFT HAND  Final   Special Requests   Final     BOTTLES DRAWN AEROBIC AND ANAEROBIC Blood Culture adequate volume   Culture   Final    NO GROWTH 5 DAYS Performed at [facility], [address], [place], [place] [address]    Report Status 09/29/2020 FINAL  Final  Urine culture     Status: None   Collection Time: 09/25/20  4:07 AM   Specimen: In/Out Cath Urine  Result Value Ref Range Status   Specimen Description   Final    IN/OUT CATH URINE Performed at [facility], [address], [place], [place] [address]    Special  Requests   Final    NONE Performed at [facility], [address] [place], [place] [address]    Culture   Final    NO GROWTH Performed at [facility], [address] [place], [place] [address]    Report Status 09/26/2020 FINAL  Final  MRSA PCR Screening     Status: None   Collection Time: 09/25/20  4:07 AM   Specimen: Nasal Mucosa; Nasopharyngeal  Result Value Ref Range Status   MRSA by PCR NEGATIVE NEGATIVE Final    Comment:        The GeneXpert MRSA Assay (FDA approved for NASAL specimens only), is one component of a comprehensive MRSA colonization surveillance program. It is not intended to diagnose MRSA infection nor to guide or monitor treatment for MRSA infections. Performed at [facility] [address] [place], [place] [address]           IMAGING    No results found.   Nutrition Status: Nutrition Problem: Inadequate oral intake Etiology: inability to eat Signs/Symptoms: NPO status Interventions: Tube feeding,Prostat,MVI     Indwelling Urinary Catheter continued, requirement due to   Reason to continue Indwelling Urinary Catheter strict Intake/Output monitoring for hemodynamic instability   Central Line/ continued, requirement due to  Reason to continue [employer] Monitoring of central venous pressure or other hemodynamic parameters and poor IV access   Ventilator continued, requirement due to severe respiratory failure    Ventilator Sedation RASS 0 to -2      ASSESSMENT AND PLAN SYNOPSIS 59 year old with ARDS, COVID-19 pneumonia, bilateral DVT and small right upper lobe PE   Acute respiratory failure with hypoxemia  . Acute COVID19 pneumonia -Remdesevir antiviral - pharmacy protocol 5 d -vitamin C -zinc -decadron 6mg  IV daily   - utilize external urinary catheter if possible -Self prone if patient can tolerate   for bronchopulmonary hygiene when able -d/c hepatotoxic medications while on remdesevir -supportive care with ICU telemetry monitoring -PT/OT when possible -procalcitonin, CRP and ferritin trending Use intermittent paralytics for vent dyssynchrony. Repeat ABG.  Prone if P/F ratio less than 150 Goal plateau pressure less than 30, goal driving pressure less than 15 Continue remdesivir, steroids Continue baricitinib   NEUROLOGY Acute toxic metabolic encephalopathy, need for sedation Continue fentanyl, propofol. Increase RASS goal to -2-3 as she is dyssynchronous with the vent Intermittent paralytics  SHOCK-SEPSIS Off pressors   CARDIAC ICU monitoring  ID Received ceftriaxone, azithromycin from 12/16- 12/17 1/2 blood cultures are growing staph hominis.  This is likely a contaminant DC vancomycin as kidney function is getting worse  GI GI PROPHYLAXIS as indicated   DIET-->TF's as tolerated Constipation protocol as indicated   ENDO - will use ICU hypoglycemic\Hyperglycemia protocol if indicated Continue Levemir, SSI   DVT/PE Continue heparin drip  RENAL AKI Creatinine continues to increase. Nephrology consult.  Likely headed towards dialysis but no acute need to start today  ELECTROLYTES -follow labs as needed -replace as needed -pharmacy consultation and following  GOALS OF CARE Updated friend 09-16-1990 and sister Kennon Rounds on 12/19 over telephone.  No answer when I called brother Duane Discussed worsening clinical status, possible need for  dialysis  Decision made to change status to limited code with no CPR or shocks in case of cardiac arrest. We will try dialysis on a time-limited basis if needed but no prolonged mechanical or dialysis support.   DVT/GI PRX ordered and assessed TRANSFUSIONS AS NEEDED MONITOR FSBS I Assessed the need for  Labs I Assessed the need for Foley I Assessed the need for Central Venous Line Family Discussion when available I Assessed the need for Mobilization I made an Assessment of medications to be adjusted accordingly Safety Risk assessment completed   CASE DISCUSSED IN MULTIDISCIPLINARY ROUNDS WITH ICU TEAM  The patient is critically ill with multiple organ system failure and requires high complexity decision making for assessment and support, frequent evaluation and titration of therapies, advanced monitoring, review of radiographic studies and interpretation of complex data.   Critical Care Time devoted to patient care services, exclusive of separately billable procedures, described in this note is 33 minutes.    Vida Rigger, M.D.  Pulmonary & Critical Care Medicine  [facility]

## 2020-10-04 ENCOUNTER — Encounter: Payer: Self-pay | Admitting: Internal Medicine

## 2020-10-04 ENCOUNTER — Inpatient Hospital Stay: Payer: Medicaid Other

## 2020-10-04 LAB — BLOOD GAS, ARTERIAL
Acid-Base Excess: 2.1 mmol/L — ABNORMAL HIGH (ref 0.0–2.0)
Bicarbonate: 29.3 mmol/L — ABNORMAL HIGH (ref 20.0–28.0)
FIO2: 0.55
MECHVT: 520 mL
Mechanical Rate: 20
O2 Saturation: 93.7 %
PEEP: 8 cmH2O
Patient temperature: 37
pCO2 arterial: 53 mmHg — ABNORMAL HIGH (ref 32.0–48.0)
pH, Arterial: 7.35 (ref 7.350–7.450)
pO2, Arterial: 73 mmHg — ABNORMAL LOW (ref 83.0–108.0)

## 2020-10-04 LAB — CBC
HCT: 32.5 % — ABNORMAL LOW (ref 36.0–46.0)
Hemoglobin: 10.3 g/dL — ABNORMAL LOW (ref 12.0–15.0)
MCH: 28.4 pg (ref 26.0–34.0)
MCHC: 31.7 g/dL (ref 30.0–36.0)
MCV: 89.5 fL (ref 80.0–100.0)
Platelets: 370 10*3/uL (ref 150–400)
RBC: 3.63 MIL/uL — ABNORMAL LOW (ref 3.87–5.11)
RDW: 15.9 % — ABNORMAL HIGH (ref 11.5–15.5)
WBC: 23 10*3/uL — ABNORMAL HIGH (ref 4.0–10.5)
nRBC: 0 % (ref 0.0–0.2)

## 2020-10-04 LAB — MAGNESIUM: Magnesium: 2.2 mg/dL (ref 1.7–2.4)

## 2020-10-04 LAB — RENAL FUNCTION PANEL
Albumin: 2.9 g/dL — ABNORMAL LOW (ref 3.5–5.0)
Anion gap: 10 (ref 5–15)
BUN: 104 mg/dL — ABNORMAL HIGH (ref 6–20)
CO2: 29 mmol/L (ref 22–32)
Calcium: 8.7 mg/dL — ABNORMAL LOW (ref 8.9–10.3)
Chloride: 112 mmol/L — ABNORMAL HIGH (ref 98–111)
Creatinine, Ser: 2.82 mg/dL — ABNORMAL HIGH (ref 0.44–1.00)
GFR, Estimated: 19 mL/min — ABNORMAL LOW (ref >60)
Glucose, Bld: 135 mg/dL — ABNORMAL HIGH (ref 70–99)
Phosphorus: 5.1 mg/dL — ABNORMAL HIGH (ref 2.5–4.6)
Potassium: 4.2 mmol/L (ref 3.5–5.1)
Sodium: 151 mmol/L — ABNORMAL HIGH (ref 135–145)

## 2020-10-04 LAB — BLOOD GAS, VENOUS
Acid-Base Excess: 2.8 mmol/L — ABNORMAL HIGH (ref 0.0–2.0)
Bicarbonate: 29.3 mmol/L — ABNORMAL HIGH (ref 20.0–28.0)
FIO2: 0.35
MECHVT: 520 mL
O2 Saturation: 77 %
PEEP: 8 cmH2O
Patient temperature: 37
RATE: 20 resp/min
pCO2, Ven: 53 mmHg (ref 44.0–60.0)
pH, Ven: 7.35 (ref 7.250–7.430)
pO2, Ven: 44 mmHg (ref 32.0–45.0)

## 2020-10-04 LAB — GLUCOSE, CAPILLARY
Glucose-Capillary: 117 mg/dL — ABNORMAL HIGH (ref 70–99)
Glucose-Capillary: 146 mg/dL — ABNORMAL HIGH (ref 70–99)
Glucose-Capillary: 129 mg/dL — ABNORMAL HIGH (ref 70–99)
Glucose-Capillary: 172 mg/dL — ABNORMAL HIGH (ref 70–99)
Glucose-Capillary: 159 mg/dL — ABNORMAL HIGH (ref 70–99)
Glucose-Capillary: 114 mg/dL — ABNORMAL HIGH (ref 70–99)

## 2020-10-04 LAB — PROCALCITONIN: Procalcitonin: 0.12 ng/mL

## 2020-10-04 LAB — HEPARIN LEVEL (UNFRACTIONATED): Heparin Unfractionated: 0.59 IU/mL (ref 0.30–0.70)

## 2020-10-04 LAB — C-REACTIVE PROTEIN: CRP: 7.3 mg/dL — ABNORMAL HIGH (ref <1.0)

## 2020-10-04 MED ORDER — VECURONIUM BROMIDE 10 MG IV SOLR
10.0000 mg | Freq: Once | INTRAVENOUS | Status: AC
  Administered 2020-10-04: 02:00:00 10 mg via INTRAVENOUS

## 2020-10-04 MED ORDER — VECURONIUM BROMIDE 10 MG IV SOLR
INTRAVENOUS | Status: AC
  Filled 2020-10-04: qty 10

## 2020-10-04 MED ORDER — SODIUM CHLORIDE 0.9 % IV SOLN
2.0000 g | INTRAVENOUS | Status: AC
  Administered 2020-10-04 – 2020-10-10 (×7): 2 g via INTRAVENOUS
  Filled 2020-10-04 (×7): qty 2

## 2020-10-04 MED ORDER — FREE WATER
200.0000 mL | Status: DC
  Administered 2020-10-04 – 2020-10-06 (×15): 200 mL
  Administered 2020-10-06: 12:00:00 230 mL
  Administered 2020-10-06 – 2020-10-07 (×15): 200 mL

## 2020-10-04 MED ORDER — MIDAZOLAM HCL 2 MG/2ML IJ SOLN
4.0000 mg | Freq: Once | INTRAMUSCULAR | Status: AC
  Administered 2020-10-04: 03:00:00 4 mg via INTRAVENOUS

## 2020-10-04 MED ORDER — FENTANYL BOLUS VIA INFUSION
100.0000 ug | Freq: Once | INTRAVENOUS | Status: AC
  Administered 2020-10-04: 03:00:00 100 ug via INTRAVENOUS
  Filled 2020-10-04: qty 100

## 2020-10-04 NOTE — Progress Notes (Signed)
Shift summary  Patient maxed out on sedation due to dyssychnronous with vent throughout shift. Provider aware orders for versed and fentanyl boluses ordered throughout shift.

## 2020-10-04 NOTE — Consult Note (Signed)
ANTICOAGULATION CONSULT NOTE   Pharmacy Consult for Heparin Indication: DVT/PE  Patient Measurements: Heparin Dosing Weight: 63.5 kg  Labs: Recent Labs    10/02/20 0049 10/02/20 0418 10/02/20 0812 10/02/20 1013 10/03/20 0502 10/04/20 0315  HGB  --  10.1*  --   --  9.3* 10.3*  HCT  --  29.5*  --   --  28.6* 32.5*  PLT  --  331  --   --  285 370  HEPARINUNFRC 0.42  --   --   --  0.66 0.59  CREATININE  --  3.58*  --   --  3.19* 2.82*  TROPONINIHS  --   --  30* 34*  --   --    Estimated Creatinine Clearance: 20.3 mL/min (A) (by C-G formula based on SCr of 2.82 mg/dL (H)).  Medical History: Past Medical History:  Diagnosis Date  . Anemia    noted during hip surgery   . Anxiety    states no longer issue  . Frequent headaches   . Hypertension   . Lumbago   . 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 Patient was admitted for acute hypoxic respiratory failure 2/2 PNA due to COVID-19. Pharmacy consulted for heparin dosing and monitoring for ACS and pulmonary embolism. CT chest with small pulmonary embolism right upper lobe. Trop elevated 260. CBC stable. No DOAC PTA.   Goal of Therapy:  Heparin level 0.3-0.7 units/ml Monitor platelets by anticoagulation protocol: Yes   12/17 0621 HL = 0.2, bolus 1000 units, increased rate to 900 units/hr  12/17 1802 HL = 0.26, bolus 900 units, increased rate to 1000 units/hr 12/18 0057 HL = 0.29, bolus 900 units, increased rate to 1100 units/hr 12/18 0814 HL = 0.42 x1, rate continued at 1100 units/hr 12/18 1500 HL = 0.40 x2 12/19 0511 HL = 0.58, therapeutic X 3  12/20 0322 HL = 1.84 , supratherapeutic -1100 12/20 0610 HL = 2.0     supratherapeutic-850 12/21 0621 HL = 0.36 therapeutic x 1 12/21 2336 HL = 0.23, subtherapeutic 12/22 0801 HL = 0.25, subtherapeutic 12/22 1643 HL = 0.37, therapeutic x  1 12/22 2339 HL = 0.31, therapeutic x 2 12/23 0626 HL = 0.10, subtherapeutic 12/23 1706 HL = 0.59, therapeutic x1 12/24 0049 HL = 0.42, therapeutic x 2 12/25 0502 HL = 0.66, therapeutic x 3, H/H slightly worse, PLTs ok 12/26 0315 HL = 0.59, therapeutic x 4.  CBC stable.   Plan:  HL therapeutic, continue current heparin drip rate at 1050 units/hr Re-check HL daily w/ CBC Daily CBC per protocol while on heparin infusion  Wayland Denis, PharmD 10/04/2020 5:08 AM

## 2020-10-04 NOTE — Progress Notes (Signed)
CRITICAL CARE NOTE  59 yo female admitted with acute hypoxic respiratory failure secondary to pneumonia due to COVID-19 requiring HHFNC and NRB now intubated and severe resp failure complicated by DVT and PE  12/16 intubated 12/17 high risk for cardiac arrest, on pressors 12/18 Off pressors 12/19 worsening renal function.  Nephrology consult 09/28/20- patient was noted to be severely acidemic with HAGMA in am s/p 2 amp bicarb with repeat ABG significantly improved 12/21- patient will require HD, will place vascath today. Meds reviewed and changed, TG elevated on propofol. DCd IVF. 12/22-  Patient remains critically ill. Reviewed plan with nephrologist, patient will not have HD today. 10/01/20- patient on 35%FIo2 8peep, she diruesed >3L we will hold HD, continue diuresis. SBT.  10/02/20- patient with tachyarrythmia overnight.  S/p cardiology evaluation. For SBT today.  10/03/20- patient with no overnight events, remains critically ill on MV.  Diuresed well overnight.  Discussed care plan with nephrologist today.  10/04/20-  Patient remains critically ill.  She had SBT daily and has not been able to pass yet.    CC  follow up respiratory failure  SUBJECTIVE FiO2 down to 35% Dyssynchronous with the ventilator Renal function is slightly improved  Vent Mode: PRVC FiO2 (%):  [40 %-55 %] 55 % Set Rate:  [20 bmp] 20 bmp Vt Set:  [520 mL] 520 mL PEEP:  [8 cmH20] 8 cmH20 Plateau Pressure:  [12 cmH20-25 cmH20] 25 cmH20  CBC    Component Value Date/Time   WBC 23.0 (H) 10/04/2020 0315   RBC 3.63 (L) 10/04/2020 0315   HGB 10.3 (L) 10/04/2020 0315   HGB 13.6 07/08/2012 2019   HCT 32.5 (L) 10/04/2020 0315   HCT 39.4 07/08/2012 2019   PLT 370 10/04/2020 0315   PLT 202 07/08/2012 2019   MCV 89.5 10/04/2020 0315   MCV 91 07/08/2012 2019   MCH 28.4 10/04/2020 0315   MCHC 31.7 10/04/2020 0315   RDW 15.9 (H) 10/04/2020 0315   RDW 12.6 07/08/2012 2019   LYMPHSABS 1.3 09/26/2020 0426    MONOABS 0.7 09/26/2020 0426   EOSABS 0.0 09/26/2020 0426   BASOSABS 0.0 09/26/2020 0426   BMP Latest Ref Rng & Units 10/04/2020 10/03/2020 10/02/2020  Glucose 70 - 99 mg/dL 161(W) 960(A) 540(J)  BUN 6 - 20 mg/dL 811(B) 147(W) 295(A)  Creatinine 0.44 - 1.00 mg/dL 2.13(Y) 8.65(H) 8.46(N)  Sodium 135 - 145 mmol/L 151(H) 150(H) 149(H)  Potassium 3.5 - 5.1 mmol/L 4.2 4.2 4.0  Chloride 98 - 111 mmol/L 112(H) 112(H) 111  CO2 22 - 32 mmol/L Calcium 8.9 - 10.3 mg/dL 6.2(X) 5.2(W) 4.1(L)     BP 138/77 (BP Location: Right Arm)   Pulse 72   Temp 97.88 F (36.6 C) (Esophageal)   Resp 15   Ht  (1.626 m)   Wt 71.7 kg   SpO2 96%   BMI 27.13 kg/m    I/O last 3 completed shifts: In: 3708.1 [I.V.:2415.4; Other:85; NG/GT:1207.7] Out: 4640 [Urine:4375; Stool:265] Total I/O In: 903 [I.V.:903] Out: -   SpO2: 96 % O2 Flow Rate (L/min): 15 L/min FiO2 (%): 55 %  Estimated body mass index is 27.13 kg/m as calculated from the following:   Height as of this encounter:  (1.626 m).   Weight as of this encounter: 71.7 kg.  SIGNIFICANT EVENTS   REVIEW OF SYSTEMS  PATIENT IS UNABLE TO PROVIDE COMPLETE REVIEW OF SYSTEMS DUE TO SEVERE CRITICAL ILLNESS    PHYSICAL EXAMINATION: Gen:  No acute distress HEENT:  EOMI, sclera anicteric Neck:     No masses; no thyromegaly, ET tube Lungs:    Rhonchi b/l CV:         Regular rate and rhythm; no murmurs Abd:      + bowel sounds; soft, non-tender; no palpable masses, no distension Ext:    No edema; adequate peripheral perfusion Skin:      Warm and dry; no rash Neuro: Sedated, unresponsive GCS4T    MEDICATIONS: I have reviewed all medications and confirmed regimen as documented   CULTURE RESULTS   Recent Results (from the past 240 hour(s))  Blood Culture (routine x 2)     Status: Abnormal   Collection Time: October 03, 2020  7:39 PM   Specimen: BLOOD  Result Value Ref Range Status   Specimen Description   Final    BLOOD  LEFT ANTECUBITAL Performed at [facility], [address] [place], [place] [address]    Special Requests   Final    BOTTLES DRAWN AEROBIC AND ANAEROBIC Blood Culture adequate volume Performed at [facility], [address] [place], [place] [address]    Culture  Setup Time   Final    Organism ID to follow GRAM POSITIVE COCCI AEROBIC BOTTLE ONLY CRITICAL RESULT CALLED TO, READ BACK BY AND VERIFIED WITH: [facility] MERRILL AT 1339 09/25/20 SDR Performed at [facility], [address] [place], [place] [address]    Culture (A)  Final    STAPHYLOCOCCUS HOMINIS THE SIGNIFICANCE OF ISOLATING THIS ORGANISM FROM A SINGLE SET OF BLOOD CULTURES WHEN MULTIPLE SETS ARE DRAWN IS UNCERTAIN. PLEASE NOTIFY THE MICROBIOLOGY DEPARTMENT WITHIN ONE WEEK IF SPECIATION AND SENSITIVITIES ARE REQUIRED. Performed at [facility], [address], [place], [place] [address]    Report Status 09/27/2020 FINAL  Final  Blood Culture ID Panel (Reflexed)     Status: Abnormal   Collection Time: Oct 03, 2020  7:39 PM  Result Value Ref Range Status   Enterococcus faecalis NOT DETECTED NOT DETECTED Final   Enterococcus Faecium NOT DETECTED NOT DETECTED Final   Listeria monocytogenes NOT DETECTED NOT DETECTED Final   Staphylococcus species DETECTED (A) NOT DETECTED Final    Comment: CRITICAL RESULT CALLED TO, READ BACK BY AND VERIFIED WITH:  KRISTIN  MERRILL AT 1339 09/25/20 SDR    Staphylococcus aureus (BCID) NOT DETECTED NOT DETECTED Final   Staphylococcus epidermidis NOT DETECTED NOT DETECTED Final   Staphylococcus lugdunensis NOT DETECTED NOT DETECTED Final   Streptococcus species NOT DETECTED NOT DETECTED Final   Streptococcus agalactiae NOT DETECTED NOT DETECTED Final   Streptococcus pneumoniae NOT DETECTED NOT DETECTED Final   Streptococcus pyogenes NOT DETECTED NOT DETECTED Final   A.calcoaceticus-baumannii NOT DETECTED NOT DETECTED Final   Bacteroides fragilis NOT DETECTED  NOT DETECTED Final   Enterobacterales NOT DETECTED NOT DETECTED Final   Enterobacter cloacae complex NOT DETECTED NOT DETECTED Final   Escherichia coli NOT DETECTED NOT DETECTED Final   Klebsiella aerogenes NOT DETECTED NOT DETECTED Final   Klebsiella oxytoca NOT DETECTED NOT DETECTED Final   Klebsiella pneumoniae NOT DETECTED NOT DETECTED Final   Proteus species NOT DETECTED NOT DETECTED Final   Salmonella species NOT DETECTED NOT DETECTED Final   Serratia marcescens NOT DETECTED NOT DETECTED Final   Haemophilus influenzae NOT DETECTED NOT DETECTED Final   Neisseria meningitidis NOT DETECTED NOT DETECTED Final   Pseudomonas aeruginosa NOT DETECTED NOT DETECTED Final   Stenotrophomonas maltophilia NOT DETECTED NOT DETECTED Final  Candida albicans NOT DETECTED NOT DETECTED Final   Candida auris NOT DETECTED NOT DETECTED Final   Candida glabrata NOT DETECTED NOT DETECTED Final   Candida krusei NOT DETECTED NOT DETECTED Final   Candida parapsilosis NOT DETECTED NOT DETECTED Final   Candida tropicalis NOT DETECTED NOT DETECTED Final   Cryptococcus neoformans/gattii NOT DETECTED NOT DETECTED Final    Comment: Performed at [facility], [address] [place], [place] [address]  Resp Panel by RT-PCR (Flu A&B, Covid) Nasopharyngeal Swab     Status: Abnormal   Collection Time: 2020/09/05  7:40 PM   Specimen: Nasopharyngeal Swab; Nasopharyngeal(NP) swabs in vial transport medium  Result Value Ref Range Status   SARS Coronavirus 2 by RT PCR POSITIVE (A) NEGATIVE Final    Comment: RESULT CALLED TO, READ BACK BY AND VERIFIED WITH: KENDELL SHEETS @2138  ON 2020/09/05 SK[facility] (NOTE) SARS-CoV-2 target nucleic acids are DETECTED.  The SARS-CoV-2 RNA is generally detectable in upper respiratory specimens during the acute phase of infection. Positive results are indicative of the presence of the identified virus, but do not rule out bacterial infection or co-infection with other pathogens  not detected by the test. Clinical correlation with patient history and other diagnostic information is necessary to determine patient infection status. The expected result is Negative.  Fact Sheet for Patients: BloggerCourse.comhttps://www.fda.gov/media/152166/download  Fact Sheet for Healthcare Providers: SeriousBroker.ithttps://www.fda.gov/media/152162/download  This test is not yet approved or cleared by the [place] FDA and  has been authorized for detection and/or diagnosis of SARS-CoV-2 by FDA under an Emergency Use Authorization (EUA).  This EUA will remain in effect (meaning this test can  be used) for the duration of  the COVID-19 declaration under Section 564(b)(1) of the Act, 21 U.S.C. section 360bbb-3(b)(1), unless the authorization is terminated or revoked sooner.     Influenza A by PCR NEGATIVE NEGATIVE Final   Influenza B by PCR NEGATIVE NEGATIVE Final    Comment: (NOTE) The Xpert Xpress SARS-CoV-2/FLU/RSV plus assay is intended as an aid in the diagnosis of influenza from Nasopharyngeal swab specimens and should not be used as a sole basis for treatment. Nasal washings and aspirates are unacceptable for Xpert Xpress SARS-CoV-2/FLU/RSV testing.  Fact Sheet for Patients: BloggerCourse.comhttps://www.fda.gov/media/152166/download  Fact Sheet for Healthcare Providers: SeriousBroker.ithttps://www.fda.gov/media/152162/download  This test is not yet approved or cleared by the [place] FDA and has been authorized for detection and/or diagnosis of SARS-CoV-2 by FDA under an Emergency Use Authorization (EUA). This EUA will remain in effect (meaning this test can be used) for the duration of the COVID-19 declaration under Section 564(b)(1) of the Act, 21 U.S.C. section 360bbb-3(b)(1), unless the authorization is terminated or revoked.  Performed at [facility], [address] [place], [place] [address]   Blood Culture (routine x 2)     Status: None   Collection Time: 2020/09/05  7:44 PM   Specimen: BLOOD   Result Value Ref Range Status   Specimen Description BLOOD BLOOD LEFT HAND  Final   Special Requests   Final    BOTTLES DRAWN AEROBIC AND ANAEROBIC Blood Culture adequate volume   Culture   Final    NO GROWTH 5 DAYS Performed at [facility], [address] [place], [place] [address]    Report Status 09/29/2020 FINAL  Final  Urine culture     Status: None   Collection Time: 09/25/20  4:07 AM   Specimen: In/Out Cath Urine  Result Value Ref Range Status   Specimen Description  Final    IN/OUT CATH URINE Performed at [facility], [address], [place], [place] [address]    Special Requests   Final    NONE Performed at [facility], [address], [place], [place] [address]    Culture   Final    NO GROWTH Performed at [facility], [address], [place], [place] [address]    Report Status 09/26/2020 FINAL  Final  MRSA PCR Screening     Status: None   Collection Time: 09/25/20  4:07 AM   Specimen: Nasal Mucosa; Nasopharyngeal  Result Value Ref Range Status   MRSA by PCR NEGATIVE NEGATIVE Final    Comment:        The GeneXpert MRSA Assay (FDA approved for NASAL specimens only), is one component of a comprehensive MRSA colonization surveillance program. It is not intended to diagnose MRSA infection nor to guide or monitor treatment for MRSA infections. Performed at [facility], [address] [place], [place] [address]           IMAGING    DG Chest Port 1 View  Result Date: 10/04/2020 CLINICAL DATA:  Acute respiratory failure EXAM: PORTABLE CHEST 1 VIEW COMPARISON:  Two days ago FINDINGS: Endotracheal tube with tip halfway between the clavicular heads and carina. Left dialysis catheter with tip at the SVC origin. Enteric tube which at least reaches the stomach. Confluent bilateral airspace disease. The density appears increased. No visible effusion or pneumothorax. Normal heart size for technique. IMPRESSION: 1.  Stable hardware positioning. 2. Multifocal pneumonia with progressive density from 2 days ago. Electronically Signed   By: Marnee Spring M.D.   On: 10/04/2020 04:19     Nutrition Status: Nutrition Problem: Inadequate oral intake Etiology: inability to eat Signs/Symptoms: NPO status Interventions: Tube feeding,Prostat,MVI     Indwelling Urinary Catheter continued, requirement due to   Reason to continue Indwelling Urinary Catheter strict Intake/Output monitoring for hemodynamic instability   Central Line/ continued, requirement due to  Reason to continue [employer] Monitoring of central venous pressure or other hemodynamic parameters and poor IV access   Ventilator continued, requirement due to severe respiratory failure   Ventilator Sedation RASS 0 to -2      ASSESSMENT AND PLAN SYNOPSIS 59 year old with ARDS, COVID-19 pneumonia, bilateral DVT and small right upper lobe PE   Acute respiratory failure with hypoxemia  . Acute COVID19 pneumonia -Remdesevir antiviral - pharmacy protocol 5 d -vitamin C -zinc -decadron 6mg  IV daily   - utilize external urinary catheter if possible -Self prone if patient can tolerate   for bronchopulmonary hygiene when able -d/c hepatotoxic medications while on remdesevir -supportive care with ICU telemetry monitoring -PT/OT when possible -procalcitonin, CRP and ferritin trending Use intermittent paralytics for vent dyssynchrony. Repeat ABG.  Prone if P/F ratio less than 150 Goal plateau pressure less than 30, goal driving pressure less than 15 Continue remdesivir, steroids Continue baricitinib   NEUROLOGY Acute toxic metabolic encephalopathy, need for sedation Continue fentanyl, propofol. Increase RASS goal to -2-3 as she is dyssynchronous with the vent Intermittent paralytics  SHOCK-SEPSIS Off pressors   CARDIAC ICU monitoring  ID Received ceftriaxone, azithromycin from 12/16- 12/17 1/2 blood cultures are growing staph  hominis.  This is likely a contaminant DC vancomycin as kidney function is getting worse  GI GI PROPHYLAXIS as indicated   DIET-->TF's as tolerated Constipation protocol as indicated   ENDO - will use ICU hypoglycemic\Hyperglycemia protocol if indicated Continue Levemir, SSI  DVT/PE Continue heparin drip  RENAL AKI Creatinine continues to increase. Nephrology consult.  Likely headed towards dialysis but no acute need to start today  ELECTROLYTES -follow labs as needed -replace as needed -pharmacy consultation and following  GOALS OF CARE Updated friend Kennon Rounds and sister Alvira Philips on 12/19 over telephone.  No answer when I called brother Duane Discussed worsening clinical status, possible need for dialysis  Decision made to change status to limited code with no CPR or shocks in case of cardiac arrest. We will try dialysis on a time-limited basis if needed but no prolonged mechanical or dialysis support.   DVT/GI PRX ordered and assessed TRANSFUSIONS AS NEEDED MONITOR FSBS I Assessed the need for Labs I Assessed the need for Foley I Assessed the need for Central Venous Line Family Discussion when available I Assessed the need for Mobilization I made an Assessment of medications to be adjusted accordingly Safety Risk assessment completed   CASE DISCUSSED IN MULTIDISCIPLINARY ROUNDS WITH ICU TEAM  The patient is critically ill with multiple organ system failure and requires high complexity decision making for assessment and support, frequent evaluation and titration of therapies, advanced monitoring, review of radiographic studies and interpretation of complex data.   Critical Care Time devoted to patient care services, exclusive of separately billable procedures, described in this note is 33 minutes.    Vida Rigger, M.D.  Pulmonary & Critical Care Medicine  [facility]

## 2020-10-04 NOTE — Consult Note (Signed)
Pharmacy Antibiotic Note  Margaret Shepard is a 59 y.o. female admitted on October 16, 2020 with COVID-19 pneumonia. Patient has been intubated and on mechanical ventilation since 12/17. Pharmacy has been consulted for cefepime dosing.  Plan: Cefepime 2 g IV q24h (renally adjusted)  Height: 5\' 4"  (162.6 cm) Weight: 71.7 kg (158 lb 1.1 oz) IBW/kg (Calculated) : 54.7  Temp (24hrs), Avg:97.5 F (36.4 C), Min:95.9 F (35.5 C), Max:98.96 F (37.2 C)  Recent Labs  Lab 09/30/20 0801 10/01/20 0626 10/01/20 1723 10/02/20 0418 10/03/20 0502 10/04/20 0315  WBC 13.0* 9.7  --  11.7* 9.4 23.0*  CREATININE  --  3.90* 3.89* 3.58* 3.19* 2.82*    Estimated Creatinine Clearance: 20.6 mL/min (A) (by C-G formula based on SCr of 2.82 mg/dL (H)).    Allergies  Allergen Reactions   Hydrocodone-Acetaminophen Nausea And Vomiting and Itching    Antimicrobials this admission: Ceftriaxone 12/16 x 1 Azithromycin 12/16 x 1 Remdesivir 12/16 >> 12/20 Vancomycin 12/18 x 1 Cefepime 12/26 >>   Dose adjustments this admission: N/A  Microbiology results: 12/16 SARS-CoV-2 PCR: (+) 12/16 BCx: 1/4 bottles Staphylococcus hominis 12/17 UCx: No growth  12/17 MRSA PCR: negative 12/26 Tracheal aspirate: pending  Thank you for allowing pharmacy to be a part of this patients care.  1/27 10/04/2020 8:33 PM

## 2020-10-04 NOTE — Progress Notes (Signed)
Sputum obtained by tracheal suctioning

## 2020-10-04 NOTE — Progress Notes (Signed)
[place], [place] 10/04/20  Subjective:   Hospital day # 10  cvs:  no pressors Pulm:vent assisted; Fio2 35%; ->55% Propofol Fentanyl Heparin versed  Renal: 12/25 0701 - 12/26 0700 In: 2175.6 [I.V.:1487.9; NG/GT:602.7] Out: 3425 [Urine:3210; Stool:215] Lab Results  Component Value Date   CREATININE 2.82 (H) 10/04/2020   CREATININE 3.19 (H) 10/03/2020   CREATININE 3.58 (H) 10/02/2020   Critically ill appearing  Objective:  Vital signs in last 24 hours:  Temp:  [97.34 F (36.3 C)-99.14 F (37.3 C)] 97.7 F (36.5 C) (12/26 0900) Pulse Rate:  [57-98] 85 (12/26 0900) Resp:  [14-30] 15 (12/26 0600) BP: (105-179)/(59-102) 127/69 (12/26 0900) SpO2:  [90 %-96 %] 95 % (12/26 0900) FiO2 (%):  [40 %-55 %] 55 % (12/26 0800) Weight:  [71.7 kg] 71.7 kg (12/26 0500)  Weight change: 2.2 kg Filed Weights   10/02/20 0500 10/03/20 0458 10/04/20 0500  Weight: 72.4 kg 69.5 kg 71.7 kg    Intake/Output:    Intake/Output Summary (Last 24 hours) at 10/04/2020 0957 Last data filed at 10/04/2020 0900 Gross per 24 hour  Intake 2773.77 ml  Output 2840 ml  Net -66.23 ml    Physical Exam: General:  No acute distress, laying in the bed, critically ill  HEENT  ETT in place, OGT  Pulm/lungs  vent assisted  CVS/Heart  regular rhythm,   Abdomen:   Soft,   Extremities:  + peripheral edema  Neurologic:  sedated  Skin:  No acute rashes  Foley in place Rectal tube   Basic Metabolic Panel:  Recent Labs  Lab 09/29/20 0621 09/30/20 0720 10/01/20 0626 10/01/20 1723 10/02/20 0418 10/02/20 0812 10/03/20 0502 10/04/20 0315  NA 143 145 149* 145 149*  --  150* 151*  K 3.9 4.0 4.4 4.7 4.0  --  4.2 4.2  CL 109 109 112* 109 111  --  112* 112*  CO2 19* 21* _0 --  25 29  GLUCOSE 161* 152* 126* 257* 119*  --  192* 135*  BUN 100* 105* 116* 113* 120*  --  113* 104*  CREATININE 4.06* 4.08* 3.90* 3.89* 3.58*  --  3.19* 2.82*  CALCIUM 7.8* 8.3* 8.5*  8.7* 8.7*  --  8.5* 8.7*  MG 2.8*  --  2.7* 2.6*  --  2.4  --  2.2  PHOS 7.1* 6.4* 6.7*  --  5.7*  --  5.4* 5.1*     CBC: Recent Labs  Lab 09/30/20 0801 10/01/20 0626 10/02/20 0418 10/03/20 0502 10/04/20 0315  WBC 13.0* 9.7 11.7* 9.4 23.0*  HGB 10.6* 10.0* 10.1* 9.3* 10.3*  HCT 31.1* 29.7* 29.5* 28.6* 32.5*  MCV 86.4 86.1 84.8 86.9 89.5  PLT 330 307 331 285 370      Lab Results  Component Value Date   HEPBSAG NON REACTIVE 09/27/2020      Microbiology:  Recent Results (from the past 240 hour(s))  Blood Culture (routine x 2)     Status: Abnormal   Collection Time: 09/23/2020  7:39 PM   Specimen: BLOOD  Result Value Ref Range Status   Specimen Description   Final    BLOOD LEFT ANTECUBITAL Performed at [facility], [address] [place], [place] [address]    Special Requests   Final    BOTTLES DRAWN AEROBIC AND ANAEROBIC Blood Culture adequate volume Performed at [facility], [address] [place], [place] [address]    Culture  Setup Time   Final  Organism ID to follow [place] BOTTLE ONLY CRITICAL RESULT CALLED TO, READ BACK BY AND VERIFIED WITH: Chinita Greenland AT [id number] 09/25/20 SDR Performed at [facility], [address] [place], [place] [address]    Culture (A)  Final    STAPHYLOCOCCUS HOMINIS THE SIGNIFICANCE OF ISOLATING THIS ORGANISM FROM A SINGLE SET OF BLOOD CULTURES WHEN MULTIPLE SETS ARE DRAWN IS UNCERTAIN. PLEASE NOTIFY THE MICROBIOLOGY DEPARTMENT WITHIN ONE WEEK IF SPECIATION AND SENSITIVITIES ARE REQUIRED. Performed at [facility], [place] [address] [place], [place] [address]    Report Status 09/27/2020 FINAL  Final  Blood Culture ID Panel (Reflexed)     Status: Abnormal   Collection Time: 10/04/2020  7:39 PM  Result Value Ref Range Status   Enterococcus faecalis NOT DETECTED NOT DETECTED Final   Enterococcus Faecium NOT DETECTED NOT DETECTED Final   Listeria monocytogenes NOT DETECTED  NOT DETECTED Final   Staphylococcus species DETECTED (A) NOT DETECTED Final    Comment: CRITICAL RESULT CALLED TO, READ BACK BY AND VERIFIED WITH:  KRISTIN  MERRILL AT [id number] 09/25/20 SDR    Staphylococcus aureus (BCID) NOT DETECTED NOT DETECTED Final   Staphylococcus epidermidis NOT DETECTED NOT DETECTED Final   Staphylococcus lugdunensis NOT DETECTED NOT DETECTED Final   Streptococcus species NOT DETECTED NOT DETECTED Final   Streptococcus agalactiae NOT DETECTED NOT DETECTED Final   Streptococcus pneumoniae NOT DETECTED NOT DETECTED Final   Streptococcus pyogenes NOT DETECTED NOT DETECTED Final   A.calcoaceticus-baumannii NOT DETECTED NOT DETECTED Final   Bacteroides fragilis NOT DETECTED NOT DETECTED Final   Enterobacterales NOT DETECTED NOT DETECTED Final   Enterobacter cloacae complex NOT DETECTED NOT DETECTED Final   Escherichia coli NOT DETECTED NOT DETECTED Final   Klebsiella aerogenes NOT DETECTED NOT DETECTED Final   Klebsiella oxytoca NOT DETECTED NOT DETECTED Final   Klebsiella pneumoniae NOT DETECTED NOT DETECTED Final   Proteus species NOT DETECTED NOT DETECTED Final   Salmonella species NOT DETECTED NOT DETECTED Final   Serratia marcescens NOT DETECTED NOT DETECTED Final   Haemophilus influenzae NOT DETECTED NOT DETECTED Final   Neisseria meningitidis NOT DETECTED NOT DETECTED Final   Pseudomonas aeruginosa NOT DETECTED NOT DETECTED Final   Stenotrophomonas maltophilia NOT DETECTED NOT DETECTED Final   Candida albicans NOT DETECTED NOT DETECTED Final   Candida auris NOT DETECTED NOT DETECTED Final   Candida glabrata NOT DETECTED NOT DETECTED Final   Candida krusei NOT DETECTED NOT DETECTED Final   Candida parapsilosis NOT DETECTED NOT DETECTED Final   Candida tropicalis NOT DETECTED NOT DETECTED Final   Cryptococcus neoformans/gattii NOT DETECTED NOT DETECTED Final    Comment: Performed at [facility], [address] [place],  [address]  Resp Panel  by RT-PCR (Flu A&B, Covid) Nasopharyngeal Swab     Status: Abnormal   Collection Time: 09/25/2020  7:40 PM   Specimen: Nasopharyngeal Swab; Nasopharyngeal(NP) swabs in vial transport medium  Result Value Ref Range Status   SARS Coronavirus 2 by RT PCR POSITIVE (A) NEGATIVE Final    Comment: RESULT CALLED TO, READ BACK BY AND VERIFIED WITH: KENDELL SHEETS _0  ON 09/11/2020 SK[facility] (NOTE) SARS-CoV-2 target nucleic acids are DETECTED.  The SARS-CoV-2 RNA is generally detectable in upper respiratory specimens during the acute phase of infection. Positive results are indicative of the presence of the identified virus, but do not rule out bacterial infection or co-infection with other pathogens not detected by the test. Clinical correlation with patient history and  other diagnostic information is necessary to determine patient infection status. The expected result is Negative.  Fact Sheet for Patients: EntrepreneurPulse.com.au  Fact Sheet for Healthcare Providers: IncredibleEmployment.be  This test is not yet approved or cleared by the [place] FDA and  has been authorized for detection and/or diagnosis of SARS-CoV-2 by FDA under an Emergency Use Authorization (EUA).  This EUA will remain in effect (meaning this test can  be used) for the duration of  the COVID-19 declaration under Section 564(b)(1) of the Act, 21 U.S.C. section 360bbb-3(b)(1), unless the authorization is terminated or revoked sooner.     Influenza A by PCR NEGATIVE NEGATIVE Final   Influenza B by PCR NEGATIVE NEGATIVE Final    Comment: (NOTE) The Xpert Xpress SARS-CoV-2/FLU/RSV plus assay is intended as an aid in the diagnosis of influenza from Nasopharyngeal swab specimens and should not be used as a sole basis for treatment. Nasal washings and aspirates are unacceptable for Xpert Xpress SARS-CoV-2/FLU/RSV testing.  Fact Sheet for  Patients: EntrepreneurPulse.com.au  Fact Sheet for Healthcare Providers: IncredibleEmployment.be  This test is not yet approved or cleared by the [place] FDA and has been authorized for detection and/or diagnosis of SARS-CoV-2 by FDA under an Emergency Use Authorization (EUA). This EUA will remain in effect (meaning this test can be used) for the duration of the COVID-19 declaration under Section 564(b)(1) of the Act, 21 U.S.C. section 360bbb-3(b)(1), unless the authorization is terminated or revoked.  Performed at [facility], [address] [place], [place] [address]   Blood Culture (routine x 2)     Status: None   Collection Time: 10/03/2020  7:44 PM   Specimen: BLOOD  Result Value Ref Range Status   Specimen Description BLOOD BLOOD LEFT HAND  Final   Special Requests   Final    BOTTLES DRAWN AEROBIC AND ANAEROBIC Blood Culture adequate volume   Culture   Final    NO GROWTH 5 DAYS Performed at [facility], [address] [place], [place] [address]    Report Status 09/29/2020 FINAL  Final  Urine culture     Status: None   Collection Time: 09/25/20  4:07 AM   Specimen: In/Out Cath Urine  Result Value Ref Range Status   Specimen Description   Final    IN/OUT CATH URINE Performed at [facility], [address] [place], [place] [address]    Special Requests   Final    NONE Performed at [facility], [address] [place], [place] [address]    Culture   Final    NO GROWTH Performed at [facility], [place] [address], [place], [place] [address]    Report Status 09/26/2020 FINAL  Final  MRSA PCR Screening     Status: None   Collection Time: 09/25/20  4:07 AM   Specimen: Nasal Mucosa; Nasopharyngeal  Result Value Ref Range Status   MRSA by PCR NEGATIVE NEGATIVE Final    Comment:        The GeneXpert MRSA Assay (FDA approved for NASAL specimens only), is one component of  a comprehensive MRSA colonization surveillance program. It is not intended to diagnose MRSA infection nor to guide or monitor treatment for MRSA infections. Performed at [facility], [address] [place],  [address]     Coagulation Studies: No results for input(s): LABPROT, INR in the last 72 hours.  Urinalysis: No results for input(s): COLORURINE, LABSPEC, [place], [place], [place], [place], [place], [place], [place],  NITRITE, LEUKOCYTESUR in the last 72 hours.  Invalid input(s): APPERANCEUR    Imaging: DG Chest Port 1 View  Result Date: 10/04/2020 CLINICAL DATA:  Acute respiratory failure EXAM: PORTABLE CHEST 1 VIEW COMPARISON:  Two days ago FINDINGS: Endotracheal tube with tip halfway between the clavicular heads and carina. Left dialysis catheter with tip at the SVC origin. Enteric tube which at least reaches the stomach. Confluent bilateral airspace disease. The density appears increased. No visible effusion or pneumothorax. Normal heart size for technique. IMPRESSION: 1. Stable hardware positioning. 2. Multifocal pneumonia with progressive density from 2 days ago. Electronically Signed   By: Monte Fantasia M.D.   On: 10/04/2020 04:19     Medications:   . sodium chloride 150 mL/hr at 09/26/20 2000  . dexmedetomidine (PRECEDEX) IV infusion 1.2 mcg/kg/hr (10/04/20 0900)  . feeding supplement (VITAL 1.5 CAL) 1,000 mL (10/03/20 1201)  . fentaNYL infusion INTRAVENOUS 400 mcg/hr (10/04/20 0940)  . heparin 1,050 Units/hr (10/04/20 0900)  . midazolam 10 mg/hr (10/04/20 0900)   . vitamin C  500 mg Per Tube Daily  . chlorhexidine gluconate (MEDLINE KIT)  15 mL Mouth Rinse BID  . Chlorhexidine Gluconate Cloth  6 each Topical Daily  . feeding supplement (PROSource TF)  45 mL Per Tube Daily  . folic acid  1 mg Per Tube Daily  . free water  200 mL Per Tube Q4H  . insulin aspart  0-15 Units Subcutaneous Q4H  . insulin detemir  5 Units Subcutaneous  BID  . mouth rinse  15 mL Mouth Rinse 10 times per day  . metoprolol tartrate  25 mg Oral BID  . multivitamin with minerals  1 tablet Per Tube Daily  . pantoprazole (PROTONIX) IV  40 mg Intravenous Q24H  . predniSONE  50 mg Per Tube Daily  . thiamine  100 mg Per Tube Daily  . zinc sulfate  220 mg Per Tube Daily   acetaminophen, fentaNYL, heparin, labetalol, midazolam, morphine injection, ondansetron **OR** ondansetron (ZOFRAN) IV  Assessment/ Plan:  59 y.o. female with  hypertension, anxiety, anemia who was brought to the ER on December 16 with a chief complaint of cough shortness of breath   admitted on 09/15/2020 for Edema [R60.9] Lactic acid acidosis [E87.2] Acute respiratory failure with hypoxia ([facility]) [J96.01] Pneumonia due to COVID-19 virus [U07.1, J12.82]   # Acute Kidney injury with proteinuria AKI likely secondary to ATN and iv contrast exposure (12/172021) 12/25 0701 - 12/26 0700 In: 2175.6 [I.V.:1487.9; NG/GT:602.7] Out: 3425 [Urine:3210; Stool:215]   Lab Results  Component Value Date   CREATININE 2.82 (H) 10/04/2020   CREATININE 3.19 (H) 10/03/2020   CREATININE 3.58 (H) 10/02/2020    S Creatinine continues to improve Good UOP .  Almost 3200 cc last 24 hrs Dialysis catheter is in place-- but has not required HD We will discontinue IV Lasix as spontaneous diuresis is expected.  Can use as needed No acute indication for dialysis today.  We will continue to monitor closely on a daily basis  #Hypernatremia Due to brisk diuresis getting free water via tube feeds Can consider increasing frequency  # Covid 19 pneumonia Complicated by Acute resp failure with hypoxia Small pulmonary embolism right upper lobe pulmonary arteries in a distal pulmonary artery- requiring iv anticoagulation  # hyperphosphatemia Lab Results  Component Value Date   PTH 109 (H) 09/29/2020   CALCIUM 8.7 (L) 10/04/2020   PHOS 5.1 (H) 10/04/2020    CK normal Phos improving     LOS:  [place] 12/26/20219:57 AM  [place], [place]  Note: This note was prepared with Dragon dictation. Any transcription errors are unintentional

## 2020-10-04 NOTE — Progress Notes (Signed)
[facility] Cardiology [facility] Encounter Note  Patient: Margaret Shepard / Admit Date: 09/12/2020 / Date of Encounter: 10/04/2020, 6:24 AM   Subjective: 12/25.  No further evidence by telemetry of episodes of supraventricular tachycardia.  Previous tachycardia appears to have been atrial flutter with rapid ventricular rate with some periods of narrow complex and a 2-minute of wide-complex ventricular response.  No evidence of hemodynamic abnormalities during these events.  Patient was placed on amiodarone for the possibility of other rhythm disturbances and none have been seen after review today.  Patient had increase in beta-blocker use as well which may help.  Patient has had troponin levels of 260/504/713/773 most consistent with demand ischemia hypoxia and current illness with injury rather than acute coronary syndrome.  12/26.  Patient continuing to diuresis better and appears to be slightly improved overall.  The patient had a discontinuation of amiodarone for atrial flutter with wide-complex for 2 minutes in 12/24 and has had no recurrence of supraventricular tachycardia since.  She was having an increased dose of metoprolol which appears to be working fairly well.  There is no other apparent cardiovascular concerns  Echocardiogram showing normal LV systolic function with ejection fraction of 60% with moderate right ventricular and right atrial enlargement  Review of Systems: Cannot assess Objective: Telemetry: Sinus tachycardia Physical Exam: Blood pressure 126/66, pulse 81, temperature 98.06 F (36.7 C), resp. rate 15, height '5\' 4"'  (1.626 m), weight 71.7 kg, SpO2 96 %. Body mass index is 27.13 kg/m.     Intake/Output Summary (Last 24 hours) at 10/04/2020 6226 Last data filed at 10/04/2020 0100 Gross per 24 hour  Intake 2175.58 ml  Output 3125 ml  Net -949.42 ml    Inpatient Medications:  . vitamin C  500 mg Per Tube Daily  . chlorhexidine gluconate (MEDLINE KIT)  15  mL Mouth Rinse BID  . Chlorhexidine Gluconate Cloth  6 each Topical Daily  . feeding supplement (PROSource TF)  45 mL Per Tube Daily  . folic acid  1 mg Per Tube Daily  . free water  200 mL Per Tube Q4H  . insulin aspart  0-15 Units Subcutaneous Q4H  . insulin detemir  5 Units Subcutaneous BID  . mouth rinse  15 mL Mouth Rinse 10 times per day  . metoprolol tartrate  25 mg Oral BID  . multivitamin with minerals  1 tablet Per Tube Daily  . pantoprazole (PROTONIX) IV  40 mg Intravenous Q24H  . predniSONE  50 mg Per Tube Daily  . thiamine  100 mg Per Tube Daily  . zinc sulfate  220 mg Per Tube Daily   Infusions:  . sodium chloride 150 mL/hr at 09/26/20 2000  . dexmedetomidine (PRECEDEX) IV infusion 1.2 mcg/kg/hr (10/04/20 0549)  . feeding supplement (VITAL 1.5 CAL) 1,000 mL (10/03/20 1201)  . fentaNYL infusion INTRAVENOUS 400 mcg/hr (10/04/20 0321)  . heparin 1,050 Units/hr (10/04/20 0100)  . midazolam 10 mg/hr (10/04/20 0254)    Labs: Recent Labs    10/02/20 0812 10/03/20 0502 10/04/20 0315  NA  --  150* 151*  K  --  4.2 4.2  CL  --  112* 112*  CO2  --  25 29  GLUCOSE  --  192* 135*  BUN  --  113* 104*  CREATININE  --  3.19* 2.82*  CALCIUM  --  8.5* 8.7*  MG 2.4  --  2.2  PHOS  --  5.4* 5.1*   Recent Labs    10/03/20 0502  10/04/20 0315  ALBUMIN 2.9* 2.9*   Recent Labs    10/03/20 0502 10/04/20 0315  WBC 9.4 23.0*  HGB 9.3* 10.3*  HCT 28.6* 32.5*  MCV 86.9 89.5  PLT 285 370   No results for input(s): CKTOTAL, CKMB, TROPONINI in the last 72 hours. Invalid input(s): POCBNP No results for input(s): HGBA1C in the last 72 hours.   Weights: Filed Weights   10/02/20 0500 10/03/20 0458 10/04/20 0500  Weight: 72.4 kg 69.5 kg 71.7 kg     Radiology/Studies:  DG Abd 1 View  Result Date: 09/25/2020 CLINICAL DATA:  Tube placement EXAM: ABDOMEN - 1 VIEW COMPARISON:  None. FINDINGS: NG tube tip is in the distal stomach. Bilateral airspace disease again noted.  IMPRESSION: NG tube tip in the distal stomach. Electronically Signed   By: Rolm Baptise M.D.   On: 09/25/2020 03:54   CT ANGIO CHEST PE W OR WO CONTRAST  Result Date: 09/25/2020 CLINICAL DATA:  Acute hypoxic respiratory failure secondary to COVID-19 EXAM: CT ANGIOGRAPHY CHEST WITH CONTRAST TECHNIQUE: Multidetector CT imaging of the chest was performed using the standard protocol during bolus administration of intravenous contrast. Multiplanar CT image reconstructions and MIPs were obtained to evaluate the vascular anatomy. CONTRAST:  56m OMNIPAQUE IOHEXOL 350 MG/ML SOLN COMPARISON:  Portable chest 09/26/2019 FINDINGS: Cardiovascular: Small pulmonary embolism right upper lobe pulmonary arteries in a distal pulmonary artery. Two small branch pulmonary emboli right upper lobe. No central pulmonary embolism. Pulmonary arteries normal in caliber. Pulmonary arteries normal in caliber. Negative thoracic aorta. Heart size within normal limits. Minimal pericardial thickening. Mediastinum/Nodes: Endotracheal tube in good position. NG tube in the stomach. No mediastinal mass or hematoma. Lungs/Pleura: Extensive diffuse bilateral airspace disease compatible with COVID pneumonia. Bibasilar atelectasis. No significant pleural effusion. Upper Abdomen: No acute abnormality. Musculoskeletal: No acute skeletal abnormality. Review of the MIP images confirms the above findings. IMPRESSION: Small pulmonary embolism right upper lobe. Diffuse bilateral airspace disease compatible with COVID pneumonia. Bibasilar atelectasis. These results will be called to the ordering clinician or representative by the Radiologist Assistant, and communication documented in the PACS or [employer] Electronically Signed   By: CFranchot GalloM.D.   On: 09/25/2020 12:47   [place]  Result Date: 09/27/2020 CLINICAL DATA:  ATN EXAM: RENAL / URINARY TRACT ULTRASOUND COMPLETE COMPARISON:  None. FINDINGS: Right Kidney: Renal measurements: 1.3 x  6.8 x 5.7 cm = volume: 232 mL. Diffusely increased echogenicity. No hydronephrosis. Left Kidney: Renal measurements: 10.7 x 5.2 x 4.8 cm = volume: 140 mL. Diffusely increased echogenicity. No hydronephrosis. Bladder: Bladder is completely decompressed and is not visualized. Other: None. IMPRESSION: 1. Increased echogenicity of bilateral kidneys as can be seen in medical renal disease. No hydronephrosis. Electronically Signed   By: SValentino SaxonMD   On: 09/27/2020 14:54   [place]Venous Img Lower Bilateral (DVT)  Addendum Date: 09/25/2020   ADDENDUM REPORT: 09/25/2020 07:27 ADDENDUM: Study discussed by telephone with Dr. CJenell Millinerin the ICU on 09/25/2020 at 0724 hours. Electronically Signed   By: HGenevie AnnM.D.   On: 09/25/2020 07:27   Result Date: 09/25/2020 CLINICAL DATA:  59year old female with COVID-19. Bilateral lower extremity edema. EXAM: BILATERAL LOWER EXTREMITY VENOUS DOPPLER ULTRASOUND TECHNIQUE: Gray-scale sonography with graded compression, as well as color Doppler and duplex ultrasound were performed to evaluate the lower extremity deep venous systems from the level of the common femoral vein and including the common femoral, femoral, profunda femoral, popliteal and calf veins including the posterior  tibial, peroneal and gastrocnemius veins when visible. The superficial great saphenous vein was also interrogated. Spectral Doppler was utilized to evaluate flow at rest and with distal augmentation maneuvers in the common femoral, femoral and popliteal veins. COMPARISON:  No prior ultrasound. FINDINGS: RIGHT LOWER EXTREMITY Common Femoral Vein: No evidence of thrombus. Normal compressibility, respiratory phasicity and response to augmentation. Saphenofemoral Junction: No evidence of thrombus. Normal compressibility and flow on color Doppler imaging. Profunda Femoral Vein: No evidence of thrombus. Normal compressibility and flow on color Doppler imaging. Femoral Vein: No evidence of thrombus. Normal  compressibility, respiratory phasicity and response to augmentation. Popliteal Vein: The proximal popliteal vein appears compressible and patent (image 6), but there is echogenic thrombus in non compressible distal popliteal vein (image 9) and continuing into the calf veins. See image 27. Calf Veins: Thrombus.  See image 31. Other Findings:  None. LEFT LOWER EXTREMITY Common Femoral Vein: Echogenic thrombus in noncompressible vessel (image 32). Saphenofemoral Junction: Appears to remain patent (image 37). Profunda Femoral Vein: Appears to remain patent (image 41). Femoral Vein: Thrombus in the proximal and mid left femoral vein. The distal femoral vein appears to remain patent. Popliteal Vein: Incompressible with echogenic thrombus (images 57-59). Thrombus extends into the calf veins (image 61). Calf Veins: Thrombus.  See image 64. Other Findings:  None. IMPRESSION: Positive for BILATERAL lower extremity DVT, more extensive on the left. Electronically Signed: By: Genevie Ann M.D. On: 09/25/2020 06:59   DG Chest Port 1 View  Result Date: 10/04/2020 CLINICAL DATA:  Acute respiratory failure EXAM: PORTABLE CHEST 1 VIEW COMPARISON:  Two days ago FINDINGS: Endotracheal tube with tip halfway between the clavicular heads and carina. Left dialysis catheter with tip at the SVC origin. Enteric tube which at least reaches the stomach. Confluent bilateral airspace disease. The density appears increased. No visible effusion or pneumothorax. Normal heart size for technique. IMPRESSION: 1. Stable hardware positioning. 2. Multifocal pneumonia with progressive density from 2 days ago. Electronically Signed   By: Monte Fantasia M.D.   On: 10/04/2020 04:19   DG Chest Port 1 View  Result Date: 10/02/2020 CLINICAL DATA:  Hypoxia. EXAM: PORTABLE CHEST 1 VIEW COMPARISON:  September 29, 2020 FINDINGS: There is stable endotracheal tube, nasogastric tube and left internal jugular venous catheter positioning. Mild to moderate severity  multifocal infiltrates are seen throughout the periphery of both lungs. This is increased in severity when compared to the prior study. There is no evidence of a pleural effusion or pneumothorax. The heart size and mediastinal contours are within normal limits. Chronic left-sided rib fractures are seen. IMPRESSION: Mild to moderate severity bilateral multifocal infiltrates, increased in severity when compared to the prior study. Electronically Signed   By: Virgina Norfolk M.D.   On: 10/02/2020 02:08   DG Chest Port 1 View  Result Date: 09/29/2020 CLINICAL DATA:  Hypoxia. Central catheter placement. COVID-19 positive. EXAM: PORTABLE CHEST 1 VIEW COMPARISON:  September 28, 2020 FINDINGS: Central catheter tip is in the superior vena cava. Endotracheal tube tip is 4.7 cm above the carina. Nasogastric tube tip and side port are in the stomach. No pneumothorax. There is mild atelectasis in the right mid lung. Lungs elsewhere clear. Heart size and pulmonary vascularity are normal. Healed fractures of the left sixth and seventh ribs noted. IMPRESSION: Tube and catheter positions as described without pneumothorax. Slight right midlung atelectasis. Lungs elsewhere clear. Cardiac silhouette normal. Electronically Signed   By: Lowella Grip III M.D.   On: 09/29/2020 13:46  DG Chest Port 1 View  Result Date: 09/28/2020 CLINICAL DATA:  Respiratory failure. EXAM: PORTABLE CHEST 1 VIEW COMPARISON:  09/27/2020. FINDINGS: Endotracheal tube and NG tube in stable position. Heart size normal. Diffuse bilateral interstitial prominence again noted without interim change. Tiny bilateral pleural effusions can not be excluded. No pneumothorax. IMPRESSION: 1. Lines and tubes in stable position. 2. Diffuse bilateral interstitial prominence again noted without interim change. Tiny bilateral pleural effusions can not be excluded. Electronically Signed   By: Marcello Moores  Register   On: 09/28/2020 06:45   DG Chest Port 1  View  Result Date: 09/27/2020 CLINICAL DATA:  ARDS. EXAM: PORTABLE CHEST 1 VIEW COMPARISON:  September 27, 2020 FINDINGS: The endotracheal tube terminates above the carina. The enteric tube extends below the left hemidiaphragm. The heart size is stable. There are bilateral pleural effusions and bilateral airspace opacities, similar to prior study. There is no pneumothorax. There are old healed left-sided rib fractures. IMPRESSION: 1. Lines and tubes as above. 2. No significant interval change. Electronically Signed   By: Constance Holster M.D.   On: 09/27/2020 21:04   DG Chest Port 1 View  Result Date: 09/27/2020 CLINICAL DATA:  Acute respiratory failure.  History of hypertension. EXAM: PORTABLE CHEST 1 VIEW COMPARISON:  September 26, 2020 FINDINGS: The ETT is in good position. The NG tube terminates below today's film. No pneumothorax. Bibasilar infiltrates are mildly more prominent the interval. No other interval changes. IMPRESSION: 1. Support apparatus as above. 2. Bibasilar infiltrates, mildly more prominent in the interval. Electronically Signed   By: Dorise Bullion III M.D   On: 09/27/2020 09:05   DG Chest Port 1 View  Result Date: 09/26/2020 CLINICAL DATA:  ETT EXAM: PORTABLE CHEST 1 VIEW COMPARISON:  September 25, 2020 FINDINGS: The ETT is in good position. The OG tube terminates below today's film. No pneumothorax. Bilateral pulmonary infiltrates persist, improved in the interval. The cardiomediastinal silhouette is stable. IMPRESSION: 1. Support apparatus as above. 2. Infiltrate remains in the right base, improved in the interval. Electronically Signed   By: Dorise Bullion III M.D   On: 09/26/2020 08:29   DG Chest Port 1 View  Result Date: 09/25/2020 CLINICAL DATA:  13-year-old female with COVID-19 and bilateral lower extremity DVT. Intubated. EXAM: PORTABLE CHEST 1 VIEW COMPARISON:  Portable chest 0340 hours today and earlier. FINDINGS: Portable AP upright view at 0654 hours.  Endotracheal tube tip in good position just below the clavicles. Enteric tube courses to the abdomen with side hole visible at the gastric body. Stable lung volumes. Mediastinal contours remain within normal limits. Hazy and confluent left lung and right lower lung opacity. No pneumothorax or pleural effusion. The left lung opacity has progressed since 09/11/2020. Negative visible bowel gas pattern. IMPRESSION: 1. Satisfactory ET tube and enteric tube. 2. Progressed left lung opacity since 09/26/2020. Stable patchy right lower lobe opacity. No pneumothorax or pleural effusion identified. Electronically Signed   By: Genevie Ann M.D.   On: 09/25/2020 07:29   DG Chest Port 1 View  Result Date: 09/25/2020 CLINICAL DATA:  Acute respiratory failure EXAM: PORTABLE CHEST 1 VIEW COMPARISON:  09/13/2020 FINDINGS: Endotracheal to is 4 cm above the carina. NG tube is in the stomach. Patchy bilateral airspace disease, right greater than left, similar to prior study. Heart is normal size. No effusions or pneumothorax. IMPRESSION: Support devices in expected position as above. Bilateral airspace disease, not significantly changed since prior study. Electronically Signed   By: Rolm Baptise M.D.  On: 09/25/2020 03:54   DG Chest Port 1 View  Result Date: 09/17/2020 CLINICAL DATA:  COVID-19 EXAM: PORTABLE CHEST 1 VIEW COMPARISON:  01/10/2011 FINDINGS: Multifocal consolidation, worst in the lower right lung. No pneumothorax or sizable pleural effusion. Normal cardiomediastinal contours. IMPRESSION: Multifocal pneumonia. Electronically Signed   By: Ulyses Jarred M.D.   On: 09/27/2020 20:15   ECHOCARDIOGRAM COMPLETE  Result Date: 09/26/2020    ECHOCARDIOGRAM REPORT   Patient Name:   [facility] Date of Exam: 09/26/2020 Medical Rec #:  [id number]           Height:       64.0 in Accession #:    [id number]          Weight:       142.2 lb Date of Birth:  Mar 24, 1961            BSA:          1.692 m Patient Age:    59 years             BP:           85/61 mmHg Patient Gender: F                   HR:           87 bpm. Exam Location:  [facility] Procedure: 2D Echo Indications:     Acute Respiratory Distress  History:         Patient has no prior history of Echocardiogram examinations.                  Risk Factors:Hypertension.  Sonographer:     Wallace Keller Thornton-Maynard Referring Phys:  [id number] Awilda Bill Diagnosing Phys: Kate Sable MD IMPRESSIONS  1. Left ventricular ejection fraction, by estimation, is 55 to 60%. The left ventricle has normal function. The left ventricle has no regional wall motion abnormalities. Left ventricular diastolic parameters are indeterminate.  2. Right ventricular systolic function is low normal. The right ventricular size is mildly enlarged. There is normal pulmonary artery systolic pressure.  3. Right atrial size was moderately dilated.  4. The mitral valve is normal in structure. No evidence of mitral valve regurgitation.  5. The aortic valve is grossly normal. Aortic valve regurgitation is not visualized. FINDINGS  Left Ventricle: Left ventricular ejection fraction, by estimation, is 55 to 60%. The left ventricle has normal function. The left ventricle has no regional wall motion abnormalities. The left ventricular internal cavity size was normal in size. There is  no left ventricular hypertrophy. Left ventricular diastolic parameters are indeterminate. Right Ventricle: The right ventricular size is mildly enlarged. No increase in right ventricular wall thickness. Right ventricular systolic function is low normal. There is normal pulmonary artery systolic pressure. The tricuspid regurgitant velocity is 2.49 m/s, and with an assumed right atrial pressure of 3 mmHg, the estimated right ventricular systolic pressure is 31.5 mmHg. Left Atrium: Left atrial size was normal in size. Right Atrium: Right atrial size was moderately dilated. Pericardium: There is no evidence of pericardial effusion. Mitral  Valve: The mitral valve is normal in structure. No evidence of mitral valve regurgitation. Tricuspid Valve: The tricuspid valve is normal in structure. Tricuspid valve regurgitation is mild. Aortic Valve: The aortic valve is grossly normal. Aortic valve regurgitation is not visualized. Aortic valve peak gradient measures 7.3 mmHg. Pulmonic Valve: The pulmonic valve was not well visualized. Pulmonic valve regurgitation is not visualized. Aorta: The aortic root is normal  in size and structure. Venous: IVC assessment for right atrial pressure unable to be performed due to mechanical ventilation. IAS/Shunts: No atrial level shunt detected by color flow Doppler.  LEFT VENTRICLE PLAX 2D LVIDd:         4.28 cm  Diastology LVIDs:         3.30 cm  LV e' medial:    5.66 cm/s LV PW:         0.98 cm  LV E/e' medial:  10.7 LV IVS:        1.03 cm  LV e' lateral:   7.40 cm/s LVOT diam:     1.80 cm  LV E/e' lateral: 8.2 LV SV:         39 LV SV Index:   23 LVOT Area:     2.54 cm  RIGHT VENTRICLE RV Basal diam:  4.18 cm RV S prime:     7.29 cm/s TAPSE (M-mode): 1.2 cm LEFT ATRIUM             Index       RIGHT ATRIUM           Index LA diam:        2.70 cm 1.60 cm/m  RA Area:     23.30 cm LA Vol (A2C):   42.7 ml 25.23 ml/m RA Volume:   80.80 ml  47.74 ml/m LA Vol (A4C):   26.8 ml 15.84 ml/m LA Biplane Vol: 33.5 ml 19.79 ml/m  AORTIC VALVE                PULMONIC VALVE AV Area (Vmax): 1.49 cm    PV Vmax:       0.72 m/s AV Vmax:        135.00 cm/s PV Peak grad:  2.1 mmHg AV Peak Grad:   7.3 mmHg LVOT Vmax:      79.00 cm/s LVOT Vmean:     54.600 cm/s LVOT VTI:       0.153 m  AORTA Ao Root diam: 3.00 cm MITRAL VALVE               TRICUSPID VALVE MV Area (PHT): 4.68 cm    TR Peak grad:   24.8 mmHg MV E velocity: 60.40 cm/s  TR Vmax:        249.00 cm/s MV A velocity: 63.40 cm/s MV E/A ratio:  0.95        SHUNTS                            Systemic VTI:  0.15 m                            Systemic Diam: 1.80 cm Kate Sable MD  Electronically signed by Kate Sable MD Signature Date/Time: 09/26/2020/11:49:11 AM    Final    [place] EKG SITE RITE  Result Date: 09/25/2020 If Site Rite image not attached, placement could not be confirmed due to current cardiac rhythm.    Assessment and Recommendation  59 y.o. female with acute respiratory failure due to Covid infection with CT showing diffuse infiltrates for Covid and pulmonary embolism status post appropriate medication management with myocardial injury without evidence of acute coronary syndrome right ventricular and right atrial enlargement likely secondary to pulmonary embolism and hypoxia with episodes atrial flutter with rapid ventricular rate nonsustained at this time after treatment with beta-blocker and amiodarone 1.  No further cardiac intervention for previous atrial flutter with rapid ventricular rate other than below due to likely being initiated by current illness which slowly is improving 2.  Continuation of beta-blocker for maintenance of normal sinus rhythm and reduction of episodes of supraventricular tachycardia 3.  No further cardiac diagnostics necessary at this time 4.  Continue supportive care of kidney injury, infection, hypoxia, respiratory failure, and pulmonary embolism which all will improve cardiovascular status  Signed, Serafina Royals M.D. FACC

## 2020-10-04 NOTE — Progress Notes (Signed)
FIO2 incr to 55% due to desatting.

## 2020-10-05 DIAGNOSIS — J9601 Acute respiratory failure with hypoxia: Secondary | ICD-10-CM | POA: Diagnosis not present

## 2020-10-05 LAB — BASIC METABOLIC PANEL
Anion gap: 10 (ref 5–15)
BUN: 97 mg/dL — ABNORMAL HIGH (ref 6–20)
CO2: 27 mmol/L (ref 22–32)
Calcium: 8.9 mg/dL (ref 8.9–10.3)
Chloride: 112 mmol/L — ABNORMAL HIGH (ref 98–111)
Creatinine, Ser: 2.42 mg/dL — ABNORMAL HIGH (ref 0.44–1.00)
GFR, Estimated: 22 mL/min — ABNORMAL LOW (ref >60)
Glucose, Bld: 118 mg/dL — ABNORMAL HIGH (ref 70–99)
Potassium: 4.6 mmol/L (ref 3.5–5.1)
Sodium: 149 mmol/L — ABNORMAL HIGH (ref 135–145)

## 2020-10-05 LAB — CBC
HCT: 30.2 % — ABNORMAL LOW (ref 36.0–46.0)
Hemoglobin: 9.4 g/dL — ABNORMAL LOW (ref 12.0–15.0)
MCH: 28.5 pg (ref 26.0–34.0)
MCHC: 31.1 g/dL (ref 30.0–36.0)
MCV: 91.5 fL (ref 80.0–100.0)
Platelets: 288 10*3/uL (ref 150–400)
RBC: 3.3 MIL/uL — ABNORMAL LOW (ref 3.87–5.11)
RDW: 16 % — ABNORMAL HIGH (ref 11.5–15.5)
WBC: 13 10*3/uL — ABNORMAL HIGH (ref 4.0–10.5)
nRBC: 0 % (ref 0.0–0.2)

## 2020-10-05 LAB — GLUCOSE, CAPILLARY
Glucose-Capillary: 108 mg/dL — ABNORMAL HIGH (ref 70–99)
Glucose-Capillary: 119 mg/dL — ABNORMAL HIGH (ref 70–99)
Glucose-Capillary: 139 mg/dL — ABNORMAL HIGH (ref 70–99)
Glucose-Capillary: 131 mg/dL — ABNORMAL HIGH (ref 70–99)
Glucose-Capillary: 107 mg/dL — ABNORMAL HIGH (ref 70–99)

## 2020-10-05 LAB — PROCALCITONIN: Procalcitonin: 0.13 ng/mL

## 2020-10-05 LAB — HEPARIN LEVEL (UNFRACTIONATED): Heparin Unfractionated: 0.49 IU/mL (ref 0.30–0.70)

## 2020-10-05 LAB — MAGNESIUM: Magnesium: 2 mg/dL (ref 1.7–2.4)

## 2020-10-05 MED ORDER — PREDNISONE 10 MG PO TABS
40.0000 mg | ORAL_TABLET | Freq: Every day | ORAL | Status: AC
  Administered 2020-10-06 – 2020-10-10 (×5): 40 mg via ORAL
  Filled 2020-10-05 (×5): qty 4

## 2020-10-05 MED ORDER — PREDNISONE 10 MG PO TABS
20.0000 mg | ORAL_TABLET | Freq: Every day | ORAL | Status: DC
  Administered 2020-10-16: 20 mg via ORAL
  Filled 2020-10-05: qty 2

## 2020-10-05 MED ORDER — PREDNISONE 10 MG PO TABS: 10.0000 mg | ORAL_TABLET | Freq: Every day | ORAL | Status: DC

## 2020-10-05 MED ORDER — PREDNISONE 10 MG PO TABS: 5.0000 mg | ORAL_TABLET | Freq: Every day | ORAL | Status: DC

## 2020-10-05 MED ORDER — FUROSEMIDE 10 MG/ML IJ SOLN
40.0000 mg | Freq: Three times a day (TID) | INTRAMUSCULAR | Status: AC
  Administered 2020-10-05 (×2): 40 mg via INTRAVENOUS
  Filled 2020-10-05 (×3): qty 4

## 2020-10-05 MED ORDER — PREDNISONE 10 MG PO TABS
30.0000 mg | ORAL_TABLET | Freq: Every day | ORAL | Status: AC
  Administered 2020-10-11 – 2020-10-15 (×5): 30 mg via ORAL
  Filled 2020-10-05 (×5): qty 3

## 2020-10-05 MED ORDER — OXYCODONE HCL 5 MG PO TABS
10.0000 mg | ORAL_TABLET | Freq: Four times a day (QID) | ORAL | Status: DC
Start: 2020-10-05 — End: 2020-10-06
  Administered 2020-10-05 – 2020-10-06 (×4): 10 mg via ORAL
  Filled 2020-10-05 (×4): qty 2

## 2020-10-05 NOTE — Progress Notes (Signed)
CRITICAL CARE NOTE  59 yo female admitted with acute hypoxic respiratory failure secondary to pneumonia due to COVID-19 requiring HHFNC and NRB now intubated and severe resp failure complicated by DVT and PE  12/16 intubated 12/17 high risk for cardiac arrest, on pressors 12/18 Off pressors 12/19 worsening renal function.  Nephrology consult 09/28/20- patient was noted to be severely acidemic with HAGMA in am s/p 2 amp bicarb with repeat ABG significantly improved 12/21- patient will require HD, will place vascath today. Meds reviewed and changed, TG elevated on propofol. DCd IVF. 12/22-  Patient remains critically ill. Reviewed plan with nephrologist, patient will not have HD today. 10/01/20- patient on 35%FIo2 8peep, she diruesed >3L we will hold HD, continue diuresis. SBT.  10/02/20- patient with tachyarrythmia overnight.  S/p cardiology evaluation. For SBT today.  10/03/20- patient with no overnight events, remains critically ill on MV.  Diuresed well overnight.  Discussed care plan with nephrologist today.  10/04/20-  Patient remains critically ill.  She had SBT daily and has not been able to pass yet.  10/05/20-with no acute events overnight remains on PEEP of 8 and FiO2 of 55%   CC  follow up respiratory failure  SUBJECTIVE FiO2 down to 35% Dyssynchronous with the ventilator Renal function is slightly improved  Vent Mode: PRVC FiO2 (%):  [50 %-55 %] 50 % Set Rate:  [20 bmp] 20 bmp Vt Set:  [500 mL-520 mL] 500 mL PEEP:  [5 cmH20-8 cmH20] 5 cmH20 Plateau Pressure:  [23 cmH20-26 cmH20] 26 cmH20  CBC    Component Value Date/Time   WBC 13.0 (H) 10/05/2020 0451   RBC 3.30 (L) 10/05/2020 0451   HGB 9.4 (L) 10/05/2020 0451   HGB 13.6 07/08/2012 2019   HCT 30.2 (L) 10/05/2020 0451   HCT 39.4 07/08/2012 2019   PLT 288 10/05/2020 0451   PLT 202 07/08/2012 2019   MCV 91.5 10/05/2020 0451   MCV 91 07/08/2012 2019   MCH 28.5 10/05/2020 0451   MCHC 31.1 10/05/2020 0451   RDW  16.0 (H) 10/05/2020 0451   RDW 12.6 07/08/2012 2019   LYMPHSABS 1.3 09/26/2020 0426   MONOABS 0.7 09/26/2020 0426   EOSABS 0.0 09/26/2020 0426   BASOSABS 0.0 09/26/2020 0426   BMP Latest Ref Rng & Units 10/05/2020 10/04/2020 10/03/2020  Glucose 70 - 99 mg/dL 962(I) 297(L) 892(J)  BUN 6 - 20 mg/dL 19(E) 174(Y) 814(G)  Creatinine 0.44 - 1.00 mg/dL 8.18(H) 6.31(S) 9.70(Y)  Sodium 135 - 145 mmol/L 149(H) 151(H) 150(H)  Potassium 3.5 - 5.1 mmol/L 4.6 4.2 4.2  Chloride 98 - 111 mmol/L 112(H) 112(H) 112(H)  CO2 22 - 32 mmol/L 27 29 25   Calcium 8.9 - 10.3 mg/dL 8.9 ) 6.3(Z)     BP 140/76   Pulse 70   Temp 98.96 F (37.2 C)   Resp 18   Ht 5\' 4"  (1.626 m)   Wt 71.6 kg   SpO2 95%   BMI 27.09 kg/m    I/O last 3 completed shifts: In: 2571.8 [I.V.:2437.4; Other:35; IV Piggyback:99.4] Out: 3065 [Urine:3050; Stool:15] Total I/O In: 830.1 [I.V.:830.1] Out: 2200 [Urine:2200]  SpO2: 95 % O2 Flow Rate (L/min): 15 L/min FiO2 (%): 50 %  Estimated body mass index is 27.09 kg/m as calculated from the following:   Height as of this encounter: 5\' 4"  (1.626 m).   Weight as of this encounter: 71.6 kg.  SIGNIFICANT EVENTS   REVIEW OF SYSTEMS  PATIENT IS UNABLE TO PROVIDE COMPLETE REVIEW OF SYSTEMS  DUE TO SEVERE CRITICAL ILLNESS    PHYSICAL EXAMINATION: Gen:      No acute distress HEENT:  EOMI, sclera anicteric Neck:     No masses; no thyromegaly, ET tube Lungs:    Rhonchi b/l CV:         Regular rate and rhythm; no murmurs Abd:      + bowel sounds; soft, non-tender; no palpable masses, no distension Ext:    No edema; adequate peripheral perfusion Skin:      Warm and dry; no rash Neuro: Sedated, unresponsive GCS4T    MEDICATIONS: I have reviewed all medications and confirmed regimen as documented   CULTURE RESULTS   No results found for this or any previous visit (from the past 240 hour(s)).        IMAGING    No results found.   Nutrition Status: Nutrition  Problem: Inadequate oral intake Etiology: inability to eat Signs/Symptoms: NPO status Interventions: Tube feeding,Prostat,MVI     Indwelling Urinary Catheter continued, requirement due to   Reason to continue Indwelling Urinary Catheter strict Intake/Output monitoring for hemodynamic instability   Central Line/ continued, requirement due to  Reason to continue [employer] Monitoring of central venous pressure or other hemodynamic parameters and poor IV access   Ventilator continued, requirement due to severe respiratory failure   Ventilator Sedation RASS 0 to -2      ASSESSMENT AND PLAN SYNOPSIS 59 year old with ARDS, COVID-19 pneumonia, bilateral DVT and small right upper lobe PE   Acute respiratory failure with hypoxemia status post remdesivir  And barcinitib Acute COVID19 pneumonia -Remdesevir antiviral - pharmacy protocol 5 d -vitamin C -zinc -On Solu-Medrol  - utilize external urinary catheter if possible -Self prone if patient can tolerate   for bronchopulmonary hygiene when able -d/c hepatotoxic medications while on remdesevir -supportive care with ICU telemetry monitoring -PT/OT when possible -procalcitonin, CRP and ferritin trending Use intermittent paralytics for vent dyssynchrony. Repeat ABG.  Prone if P/F ratio less than 150 Goal plateau pressure less than 30, goal driving pressure less than 15 -On 40 of prednisone have put in taper to wean -We will attempt to start p.o. Roxicodone  as patient does not tolerate weaning sedation for SBT   NEUROLOGY Acute toxic metabolic encephalopathy, need for sedation Continue fentanyl, propofol. Increase RASS goal to -2-3 as she is dyssynchronous with the vent Intermittent paralytics  SHOCK-SEPSIS Off pressors   CARDIAC ICU monitoring  ID Received ceftriaxone, azithromycin from 12/16- 12/17 1/2 blood cultures are growing staph hominis.  This is likely a contaminant DC vancomycin as kidney function is getting  worse  GI GI PROPHYLAXIS as indicated   DIET-->TF's as tolerated Constipation protocol as indicated   ENDO - will use ICU hypoglycemic\Hyperglycemia protocol if indicated Continue Levemir, SSI   DVT/PE Continue heparin drip  RENAL AKI Creatinine continues to increase. Nephrology consult.  Likely headed towards dialysis but no acute need to start today  ELECTROLYTES -follow labs as needed -replace as needed -pharmacy consultation and following  GOALS OF CARE Updated friend Kennon Rounds and sister Alvira Philips on 12/19 over telephone.  No answer when I called brother Duane Discussed worsening clinical status, possible need for dialysis  Decision made to change status to limited code with no CPR or shocks in case of cardiac arrest. We will try dialysis on a time-limited basis if needed but no prolonged mechanical or dialysis support.   DVT/GI PRX ordered and assessed TRANSFUSIONS AS NEEDED MONITOR FSBS I Assessed the need for Labs I  Assessed the need for Foley I Assessed the need for Central Venous Line Family Discussion when available I Assessed the need for Mobilization I made an Assessment of medications to be adjusted accordingly Safety Risk assessment completed   CASE DISCUSSED IN MULTIDISCIPLINARY ROUNDS WITH ICU TEAM  The patient is critically ill with multiple organ system failure and requires high complexity decision making for assessment and support, frequent evaluation and titration of therapies, advanced monitoring, review of radiographic studies and interpretation of complex data.   Critical Care Time devoted to patient care services, exclusive of separately billable procedures, described in this note is 33 minutes.    Vertis Kelch DO Internal Medicine/Pediatrics Pulmonary and Critical Care Fellow PGY-7

## 2020-10-05 NOTE — Consult Note (Signed)
ANTICOAGULATION CONSULT NOTE   Pharmacy Consult for Heparin Indication: DVT/PE  Patient Measurements: Heparin Dosing Weight: 63.5 kg  Labs: Recent Labs    10/02/20 0812 10/02/20 1013 10/03/20 0502 10/03/20 0502 10/04/20 0315 10/05/20 0451  HGB  --   --  9.3*   < > 10.3* 9.4*  HCT  --   --  28.6*  --  32.5* 30.2*  PLT  --   --  285  --  370 288  HEPARINUNFRC  --   --  0.66  --  0.59 0.49  CREATININE  --   --  3.19*  --  2.82* 2.42*  TROPONINIHS 30* 34*  --   --   --   --    < > = values in this interval not displayed.   Estimated Creatinine Clearance: 24 mL/min (A) (by C-G formula based on SCr of 2.42 mg/dL (H)).  Medical History: Past Medical History:  Diagnosis Date  . Anemia    noted during hip surgery   . Anxiety    states no longer issue  . Frequent headaches   . Hypertension   . Lumbago   . 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 Patient was admitted for acute hypoxic respiratory failure 2/2 PNA due to COVID-19. Pharmacy consulted for heparin dosing and monitoring for ACS and pulmonary embolism. CT chest with small pulmonary embolism right upper lobe. Trop elevated 260. CBC stable. No DOAC PTA.   Goal of Therapy:  Heparin level 0.3-0.7 units/ml Monitor platelets by anticoagulation protocol: Yes   12/17 0621 HL = 0.2, bolus 1000 units, increased rate to 900 units/hr  12/17 1802 HL = 0.26, bolus 900 units, increased rate to 1000 units/hr 12/18 0057 HL = 0.29, bolus 900 units, increased rate to 1100 units/hr 12/18 0814 HL = 0.42 x1, rate continued at 1100 units/hr 12/18 1500 HL = 0.40 x2 12/19 0511 HL = 0.58, therapeutic X 3  12/20 0322 HL = 1.84 , supratherapeutic -1100 12/20 0610 HL = 2.0     supratherapeutic-850 12/21 0621 HL = 0.36 therapeutic x 1 12/21 2336 HL = 0.23, subtherapeutic 12/22 0801 HL = 0.25,  subtherapeutic 12/22 1643 HL = 0.37, therapeutic x 1 12/22 2339 HL = 0.31, therapeutic x 2 12/23 0626 HL = 0.10, subtherapeutic 12/23 1706 HL = 0.59, therapeutic x1 12/24 0049 HL = 0.42, therapeutic x 2 12/25 0502 HL = 0.66, therapeutic x 3, H/H slightly worse, PLTs ok 12/26 0315 HL = 0.59, therapeutic x 4.  CBC stable. 12/27 9451 HL = 0.49, therapeutic x 5.  CBC stable.    Plan:  HL therapeutic, continue current heparin drip rate at 1050 units/hr Re-check HL daily w/ CBC Daily CBC per protocol while on heparin infusion  Wayland Denis, PharmD 10/05/2020 5:44 AM

## 2020-10-05 NOTE — Progress Notes (Signed)
[place], [place] 10/05/20  Subjective:   Hospital day # 11  Renal parameters do appear to be improving. Urine output over the preceding 24 hours was 1.8 L. Still remains intubated at the moment.  Renal: 12/26 0701 - 12/27 0700 In: 2085.2 [I.V.:1985.7; IV Piggyback:99.4] Out: 8889 [Urine:1850] Lab Results  Component Value Date   CREATININE 2.42 (H) 10/05/2020   CREATININE 2.82 (H) 10/04/2020   CREATININE 3.19 (H) 10/03/2020     Objective:  Vital signs in last 24 hours:  Temp:  [95.9 F (35.5 C)-99.14 F (37.3 C)] 98.96 F (37.2 C) (12/27 1300) Pulse Rate:  [58-119] 79 (12/27 1300) Resp:  [11-35] 15 (12/27 1300) BP: (107-186)/(66-86) 123/72 (12/27 1300) SpO2:  [92 %-97 %] 96 % (12/27 1300) FiO2 (%):  [50 %-55 %] 50 % (12/27 1123) Weight:  [71.6 kg] 71.6 kg (12/27 0500)  Weight change: -0.1 kg Filed Weights   10/03/20 0458 10/04/20 0500 10/05/20 0500  Weight: 69.5 kg 71.7 kg 71.6 kg    Intake/Output:    Intake/Output Summary (Last 24 hours) at 10/05/2020 1329 Last data filed at 10/05/2020 1300 Gross per 24 hour  Intake 1758.84 ml  Output 2550 ml  Net -791.16 ml    Physical Exam: General:  Critically ill-appearing  HEENT  ETT in place, OGT  Pulm/lungs  vent assisted  CVS/Heart  regular rhythm  Abdomen:   Nondistended  Extremities:  + peripheral edema  Neurologic:  sedated  Skin:  No acute rashes  Foley in place Rectal tube   Basic Metabolic Panel:  Recent Labs  Lab 09/30/20 0720 10/01/20 0626 10/01/20 1723 10/02/20 0418 10/02/20 0812 10/03/20 0502 10/04/20 0315 10/05/20 0451  NA 145 149* 145 149*  --  150* 151* 149*  K 4.0 4.4 4.7 4.0  --  4.2 4.2 4.6  CL 109 112* 109 111  --  112* 112* 112*  CO2 21* '22 22 24  ' --  '25 29 27  ' GLUCOSE 152* 126* 257* 119*  --  192* 135* 118*  BUN 105* 116* 113* 120*  --  113* 104* 97*  CREATININE 4.08* 3.90* 3.89* 3.58*  --  3.19* 2.82* 2.42*  CALCIUM 8.3* 8.5* 8.7* 8.7*  --   8.5* 8.7* 8.9  MG  --  2.7* 2.6*  --  2.4  --  2.2 2.0  PHOS 6.4* 6.7*  --  5.7*  --  5.4* 5.1*  --      CBC: Recent Labs  Lab 10/01/20 0626 10/02/20 0418 10/03/20 0502 10/04/20 0315 10/05/20 0451  WBC 9.7 11.7* 9.4 23.0* 13.0*  HGB 10.0* 10.1* 9.3* 10.3* 9.4*  HCT 29.7* 29.5* 28.6* 32.5* 30.2*  MCV 86.1 84.8 86.9 89.5 91.5  PLT 307 331 285 370 288      Lab Results  Component Value Date   HEPBSAG NON REACTIVE 09/27/2020      Microbiology:  No results found for this or any previous visit (from the past 240 hour(s)).  Coagulation Studies: No results for input(s): LABPROT, INR in the last 72 hours.  Urinalysis: No results for input(s): COLORURINE, LABSPEC, PHURINE, GLUCOSEU, HGBUR, BILIRUBINUR, KETONESUR, PROTEINUR, UROBILINOGEN, NITRITE, LEUKOCYTESUR in the last 72 hours.  Invalid input(s): APPERANCEUR    Imaging: DG Chest Port 1 View  Result Date: 10/04/2020 CLINICAL DATA:  Acute respiratory failure EXAM: PORTABLE CHEST 1 VIEW COMPARISON:  Two days ago FINDINGS: Endotracheal tube with tip halfway between the clavicular heads and carina. Left dialysis catheter with tip at the SVC  origin. Enteric tube which at least reaches the stomach. Confluent bilateral airspace disease. The density appears increased. No visible effusion or pneumothorax. Normal heart size for technique. IMPRESSION: 1. Stable hardware positioning. 2. Multifocal pneumonia with progressive density from 2 days ago. Electronically Signed   By: Monte Fantasia M.D.   On: 10/04/2020 04:19     Medications:   . sodium chloride 150 mL/hr at 09/26/20 2000  . ceFEPime (MAXIPIME) IV Stopped (10/04/20 2356)  . dexmedetomidine (PRECEDEX) IV infusion 1.2 mcg/kg/hr (10/05/20 1300)  . feeding supplement (VITAL 1.5 CAL) 1,000 mL (10/03/20 1201)  . fentaNYL infusion INTRAVENOUS 400 mcg/hr (10/05/20 1300)  . heparin 1,050 Units/hr (10/05/20 1300)  . midazolam 10 mg/hr (10/05/20 1300)   . vitamin C  500 mg Per  Tube Daily  . chlorhexidine gluconate (MEDLINE KIT)  15 mL Mouth Rinse BID  . Chlorhexidine Gluconate Cloth  6 each Topical Daily  . feeding supplement (PROSource TF)  45 mL Per Tube Daily  . folic acid  1 mg Per Tube Daily  . free water  200 mL Per Tube Q2H  . furosemide  40 mg Intravenous Q8H  . insulin aspart  0-15 Units Subcutaneous Q4H  . insulin detemir  5 Units Subcutaneous BID  . mouth rinse  15 mL Mouth Rinse 10 times per day  . metoprolol tartrate  25 mg Oral BID  . multivitamin with minerals  1 tablet Per Tube Daily  . oxyCODONE  10 mg Oral Q6H  . pantoprazole (PROTONIX) IV  40 mg Intravenous Q24H  . predniSONE  50 mg Per Tube Daily  . thiamine  100 mg Per Tube Daily  . zinc sulfate  220 mg Per Tube Daily   acetaminophen, fentaNYL, heparin, labetalol, midazolam, morphine injection, ondansetron **OR** ondansetron (ZOFRAN) IV  Assessment/ Plan:  59 y.o. female with  hypertension, anxiety, anemia who was brought to the ER on December 16 with a chief complaint of cough shortness of breath   admitted on 09/15/2020 for Edema [R60.9] Lactic acid acidosis [E87.2] Acute respiratory failure with hypoxia ([place]) [J96.01] Pneumonia due to COVID-19 virus [U07.1, J12.82]   # Acute Kidney injury with proteinuria AKI likely secondary to ATN and iv contrast exposure (12/172021) 12/26 0701 - 12/27 0700 In: 2085.2 [I.V.:1985.7; IV Piggyback:99.4] Out: 7543 [Urine:1850]   Lab Results  Component Value Date   CREATININE 2.42 (H) 10/05/2020   CREATININE 2.82 (H) 10/04/2020   CREATININE 3.19 (H) 10/03/2020    Renal parameters continue to improve.  Creatinine down to 2.4.  Good urine output noted.  No need for dialysis treatment at the moment.  Continue to monitor renal parameters and avoid nephrotoxins as possible.  #Hypernatremia Due to brisk diuresis -Serum sodium currently 149.  Continue free water flushes with tube feeds.  May need to adjust.  # Covid 19 pneumonia Complicated by  Acute resp failure with hypoxia Small pulmonary embolism right upper lobe pulmonary arteries in a distal pulmonary artery- requiring iv anticoagulation  # hyperphosphatemia Lab Results  Component Value Date   PTH 109 (H) 09/29/2020   CALCIUM 8.9 10/05/2020   PHOS 5.1 (H) 10/04/2020    Phosphorus acceptable at 5.1.     LOS: 11 Margaret Shepard 12/27/20211:29 PM  [place], [place]  Note: This note was prepared with Dragon dictation. Any transcription errors are unintentional

## 2020-10-05 NOTE — Progress Notes (Signed)
[facility] Cardiology [facility] Encounter Note  Patient: BRYTON WAIGHT / Admit Date: 09/22/2020 / Date of Encounter: 10/05/2020, 7:36 AM   Subjective: 12/25.  No further evidence by telemetry of episodes of supraventricular tachycardia.  Previous tachycardia appears to have been atrial flutter with rapid ventricular rate with some periods of narrow complex and a 2-minute of wide-complex ventricular response.  No evidence of hemodynamic abnormalities during these events.  Patient was placed on amiodarone for the possibility of other rhythm disturbances and none have been seen after review today.  Patient had increase in beta-blocker use as well which may help.  Patient has had troponin levels of 260/504/713/773 most consistent with demand ischemia hypoxia and current illness with injury rather than acute coronary syndrome.  12/26.  Patient continuing to diuresis better and appears to be slightly improved overall.  The patient had a discontinuation of amiodarone for atrial flutter with wide-complex for 2 minutes in 12/24 and has had no recurrence of supraventricular tachycardia since.  She was having an increased dose of metoprolol which appears to be working fairly well.  There is no other apparent cardiovascular concerns  12/27.  No significant changes from the cardiovascular standpoint overnight.  Over the last several days the patient has not had any further abnormal rhythm disturbances despite adjustments of medication management.  The patient appears to have some periods of sinus tachycardia as well as normal sinus rhythm.  Most of rhythm disturbances that have occurred or likely due to respiratory distress and current pneumonia electrolyte abnormalities as well as kidney injury.  Which are slightly raised improving  Echocardiogram showing normal LV systolic function with ejection fraction of 60% with moderate right ventricular and right atrial enlargement  Review of Systems: Cannot  assess Objective: Telemetry: Sinus tachycardia Physical Exam: Blood pressure 138/71, pulse (!) 107, temperature 98.78 F (37.1 C), resp. rate (!) 22, height '5\' 4"'  (1.626 m), weight 71.6 kg, SpO2 95 %. Body mass index is 27.09 kg/m.     Intake/Output Summary (Last 24 hours) at 10/05/2020 0736 Last data filed at 10/05/2020 0500 Gross per 24 hour  Intake 2085.16 ml  Output 1850 ml  Net 235.16 ml    Inpatient Medications:  . vitamin C  500 mg Per Tube Daily  . chlorhexidine gluconate (MEDLINE KIT)  15 mL Mouth Rinse BID  . Chlorhexidine Gluconate Cloth  6 each Topical Daily  . feeding supplement (PROSource TF)  45 mL Per Tube Daily  . folic acid  1 mg Per Tube Daily  . free water  200 mL Per Tube Q2H  . insulin aspart  0-15 Units Subcutaneous Q4H  . insulin detemir  5 Units Subcutaneous BID  . mouth rinse  15 mL Mouth Rinse 10 times per day  . metoprolol tartrate  25 mg Oral BID  . multivitamin with minerals  1 tablet Per Tube Daily  . pantoprazole (PROTONIX) IV  40 mg Intravenous Q24H  . predniSONE  50 mg Per Tube Daily  . thiamine  100 mg Per Tube Daily  . zinc sulfate  220 mg Per Tube Daily   Infusions:  . sodium chloride 150 mL/hr at 09/26/20 2000  . ceFEPime (MAXIPIME) IV Stopped (10/04/20 2356)  . dexmedetomidine (PRECEDEX) IV infusion Stopped (10/04/20 1613)  . feeding supplement (VITAL 1.5 CAL) 1,000 mL (10/03/20 1201)  . fentaNYL infusion INTRAVENOUS 400 mcg/hr (10/05/20 0627)  . heparin 1,050 Units/hr (10/05/20 0457)  . midazolam 10 mg/hr (10/05/20 0457)    Labs: Recent Labs  10/03/20 0502 10/04/20 0315 10/05/20 0451  NA 150* 151* 149*  K 4.2 4.2 4.6  CL 112* 112* 112*  CO2 '25 29 27  ' GLUCOSE 192* 135* 118*  BUN 113* 104* 97*  CREATININE 3.19* 2.82* 2.42*  CALCIUM 8.5* 8.7* 8.9  MG  --  2.2 2.0  PHOS 5.4* 5.1*  --    Recent Labs    10/03/20 0502 10/04/20 0315  ALBUMIN 2.9* 2.9*   Recent Labs    10/04/20 0315 10/05/20 0451  WBC 23.0* 13.0*   HGB 10.3* 9.4*  HCT 32.5* 30.2*  MCV 89.5 91.5  PLT 370 288   No results for input(s): CKTOTAL, CKMB, TROPONINI in the last 72 hours. Invalid input(s): POCBNP No results for input(s): HGBA1C in the last 72 hours.   Weights: Filed Weights   10/03/20 0458 10/04/20 0500 10/05/20 0500  Weight: 69.5 kg 71.7 kg 71.6 kg     Radiology/Studies:  DG Abd 1 View  Result Date: 09/25/2020 CLINICAL DATA:  Tube placement EXAM: ABDOMEN - 1 VIEW COMPARISON:  None. FINDINGS: NG tube tip is in the distal stomach. Bilateral airspace disease again noted. IMPRESSION: NG tube tip in the distal stomach. Electronically Signed   By: Rolm Baptise M.D.   On: 09/25/2020 03:54   CT ANGIO CHEST PE W OR WO CONTRAST  Result Date: 09/25/2020 CLINICAL DATA:  Acute hypoxic respiratory failure secondary to COVID-19 EXAM: CT ANGIOGRAPHY CHEST WITH CONTRAST TECHNIQUE: Multidetector CT imaging of the chest was performed using the standard protocol during bolus administration of intravenous contrast. Multiplanar CT image reconstructions and MIPs were obtained to evaluate the vascular anatomy. CONTRAST:  57m OMNIPAQUE IOHEXOL 350 MG/ML SOLN COMPARISON:  Portable chest 09/26/2019 FINDINGS: Cardiovascular: Small pulmonary embolism right upper lobe pulmonary arteries in a distal pulmonary artery. Two small branch pulmonary emboli right upper lobe. No central pulmonary embolism. Pulmonary arteries normal in caliber. Pulmonary arteries normal in caliber. Negative thoracic aorta. Heart size within normal limits. Minimal pericardial thickening. Mediastinum/Nodes: Endotracheal tube in good position. NG tube in the stomach. No mediastinal mass or hematoma. Lungs/Pleura: Extensive diffuse bilateral airspace disease compatible with COVID pneumonia. Bibasilar atelectasis. No significant pleural effusion. Upper Abdomen: No acute abnormality. Musculoskeletal: No acute skeletal abnormality. Review of the MIP images confirms the above findings.  IMPRESSION: Small pulmonary embolism right upper lobe. Diffuse bilateral airspace disease compatible with COVID pneumonia. Bibasilar atelectasis. These results will be called to the ordering clinician or representative by the Radiologist Assistant, and communication documented in the PACS or [employer] Electronically Signed   By: CFranchot GalloM.D.   On: 09/25/2020 12:47   [place]NAL  Result Date: 09/27/2020 CLINICAL DATA:  ATN EXAM: RENAL / URINARY TRACT ULTRASOUND COMPLETE COMPARISON:  None. FINDINGS: Right Kidney: Renal measurements: 1.3 x 6.8 x 5.7 cm = volume: 232 mL. Diffusely increased echogenicity. No hydronephrosis. Left Kidney: Renal measurements: 10.7 x 5.2 x 4.8 cm = volume: 140 mL. Diffusely increased echogenicity. No hydronephrosis. Bladder: Bladder is completely decompressed and is not visualized. Other: None. IMPRESSION: 1. Increased echogenicity of bilateral kidneys as can be seen in medical renal disease. No hydronephrosis. Electronically Signed   By: SValentino SaxonMD   On: 09/27/2020 14:54   [place]Venous Img Lower Bilateral (DVT)  Addendum Date: 09/25/2020   ADDENDUM REPORT: 09/25/2020 07:27 ADDENDUM: Study discussed by telephone with Dr. CJenell Millinerin the ICU on 09/25/2020 at 0724 hours. Electronically Signed   By: HGenevie AnnM.D.   On: 09/25/2020 07:27  Result Date: 09/25/2020 CLINICAL DATA:  59 year old female with COVID-19. Bilateral lower extremity edema. EXAM: BILATERAL LOWER EXTREMITY VENOUS DOPPLER ULTRASOUND TECHNIQUE: Gray-scale sonography with graded compression, as well as color Doppler and duplex ultrasound were performed to evaluate the lower extremity deep venous systems from the level of the common femoral vein and including the common femoral, femoral, profunda femoral, popliteal and calf veins including the posterior tibial, peroneal and gastrocnemius veins when visible. The superficial great saphenous vein was also interrogated. Spectral Doppler was utilized to  evaluate flow at rest and with distal augmentation maneuvers in the common femoral, femoral and popliteal veins. COMPARISON:  No prior ultrasound. FINDINGS: RIGHT LOWER EXTREMITY Common Femoral Vein: No evidence of thrombus. Normal compressibility, respiratory phasicity and response to augmentation. Saphenofemoral Junction: No evidence of thrombus. Normal compressibility and flow on color Doppler imaging. Profunda Femoral Vein: No evidence of thrombus. Normal compressibility and flow on color Doppler imaging. Femoral Vein: No evidence of thrombus. Normal compressibility, respiratory phasicity and response to augmentation. Popliteal Vein: The proximal popliteal vein appears compressible and patent (image 6), but there is echogenic thrombus in non compressible distal popliteal vein (image 9) and continuing into the calf veins. See image 27. Calf Veins: Thrombus.  See image 31. Other Findings:  None. LEFT LOWER EXTREMITY Common Femoral Vein: Echogenic thrombus in noncompressible vessel (image 32). Saphenofemoral Junction: Appears to remain patent (image 37). Profunda Femoral Vein: Appears to remain patent (image 41). Femoral Vein: Thrombus in the proximal and mid left femoral vein. The distal femoral vein appears to remain patent. Popliteal Vein: Incompressible with echogenic thrombus (images 57-59). Thrombus extends into the calf veins (image 61). Calf Veins: Thrombus.  See image 64. Other Findings:  None. IMPRESSION: Positive for BILATERAL lower extremity DVT, more extensive on the left. Electronically Signed: By: Genevie Ann M.D. On: 09/25/2020 06:59   DG Chest Port 1 View  Result Date: 10/04/2020 CLINICAL DATA:  Acute respiratory failure EXAM: PORTABLE CHEST 1 VIEW COMPARISON:  Two days ago FINDINGS: Endotracheal tube with tip halfway between the clavicular heads and carina. Left dialysis catheter with tip at the SVC origin. Enteric tube which at least reaches the stomach. Confluent bilateral airspace disease. The  density appears increased. No visible effusion or pneumothorax. Normal heart size for technique. IMPRESSION: 1. Stable hardware positioning. 2. Multifocal pneumonia with progressive density from 2 days ago. Electronically Signed   By: Monte Fantasia M.D.   On: 10/04/2020 04:19   DG Chest Port 1 View  Result Date: 10/02/2020 CLINICAL DATA:  Hypoxia. EXAM: PORTABLE CHEST 1 VIEW COMPARISON:  September 29, 2020 FINDINGS: There is stable endotracheal tube, nasogastric tube and left internal jugular venous catheter positioning. Mild to moderate severity multifocal infiltrates are seen throughout the periphery of both lungs. This is increased in severity when compared to the prior study. There is no evidence of a pleural effusion or pneumothorax. The heart size and mediastinal contours are within normal limits. Chronic left-sided rib fractures are seen. IMPRESSION: Mild to moderate severity bilateral multifocal infiltrates, increased in severity when compared to the prior study. Electronically Signed   By: Virgina Norfolk M.D.   On: 10/02/2020 02:08   DG Chest Port 1 View  Result Date: 09/29/2020 CLINICAL DATA:  Hypoxia. Central catheter placement. COVID-19 positive. EXAM: PORTABLE CHEST 1 VIEW COMPARISON:  September 28, 2020 FINDINGS: Central catheter tip is in the superior vena cava. Endotracheal tube tip is 4.7 cm above the carina. Nasogastric tube tip and side port are in the  stomach. No pneumothorax. There is mild atelectasis in the right mid lung. Lungs elsewhere clear. Heart size and pulmonary vascularity are normal. Healed fractures of the left sixth and seventh ribs noted. IMPRESSION: Tube and catheter positions as described without pneumothorax. Slight right midlung atelectasis. Lungs elsewhere clear. Cardiac silhouette normal. Electronically Signed   By: Lowella Grip III M.D.   On: 09/29/2020 13:46   DG Chest Port 1 View  Result Date: 09/28/2020 CLINICAL DATA:  Respiratory failure. EXAM:  PORTABLE CHEST 1 VIEW COMPARISON:  09/27/2020. FINDINGS: Endotracheal tube and NG tube in stable position. Heart size normal. Diffuse bilateral interstitial prominence again noted without interim change. Tiny bilateral pleural effusions can not be excluded. No pneumothorax. IMPRESSION: 1. Lines and tubes in stable position. 2. Diffuse bilateral interstitial prominence again noted without interim change. Tiny bilateral pleural effusions can not be excluded. Electronically Signed   By: Marcello Moores  Register   On: 09/28/2020 06:45   DG Chest Port 1 View  Result Date: 09/27/2020 CLINICAL DATA:  ARDS. EXAM: PORTABLE CHEST 1 VIEW COMPARISON:  September 27, 2020 FINDINGS: The endotracheal tube terminates above the carina. The enteric tube extends below the left hemidiaphragm. The heart size is stable. There are bilateral pleural effusions and bilateral airspace opacities, similar to prior study. There is no pneumothorax. There are old healed left-sided rib fractures. IMPRESSION: 1. Lines and tubes as above. 2. No significant interval change. Electronically Signed   By: Constance Holster M.D.   On: 09/27/2020 21:04   DG Chest Port 1 View  Result Date: 09/27/2020 CLINICAL DATA:  Acute respiratory failure.  History of hypertension. EXAM: PORTABLE CHEST 1 VIEW COMPARISON:  September 26, 2020 FINDINGS: The ETT is in good position. The NG tube terminates below today's film. No pneumothorax. Bibasilar infiltrates are mildly more prominent the interval. No other interval changes. IMPRESSION: 1. Support apparatus as above. 2. Bibasilar infiltrates, mildly more prominent in the interval. Electronically Signed   By: Dorise Bullion III M.D   On: 09/27/2020 09:05   DG Chest Port 1 View  Result Date: 09/26/2020 CLINICAL DATA:  ETT EXAM: PORTABLE CHEST 1 VIEW COMPARISON:  September 25, 2020 FINDINGS: The ETT is in good position. The OG tube terminates below today's film. No pneumothorax. Bilateral pulmonary infiltrates persist,  improved in the interval. The cardiomediastinal silhouette is stable. IMPRESSION: 1. Support apparatus as above. 2. Infiltrate remains in the right base, improved in the interval. Electronically Signed   By: Dorise Bullion III M.D   On: 09/26/2020 08:29   DG Chest Port 1 View  Result Date: 09/25/2020 CLINICAL DATA:  47-year-old female with COVID-19 and bilateral lower extremity DVT. Intubated. EXAM: PORTABLE CHEST 1 VIEW COMPARISON:  Portable chest 0340 hours today and earlier. FINDINGS: Portable AP upright view at 0654 hours. Endotracheal tube tip in good position just below the clavicles. Enteric tube courses to the abdomen with side hole visible at the gastric body. Stable lung volumes. Mediastinal contours remain within normal limits. Hazy and confluent left lung and right lower lung opacity. No pneumothorax or pleural effusion. The left lung opacity has progressed since 09/16/2020. Negative visible bowel gas pattern. IMPRESSION: 1. Satisfactory ET tube and enteric tube. 2. Progressed left lung opacity since 09/27/2020. Stable patchy right lower lobe opacity. No pneumothorax or pleural effusion identified. Electronically Signed   By: Genevie Ann M.D.   On: 09/25/2020 07:29   DG Chest Port 1 View  Result Date: 09/25/2020 CLINICAL DATA:  Acute respiratory failure EXAM: PORTABLE  CHEST 1 VIEW COMPARISON:  10/03/2020 FINDINGS: Endotracheal to is 4 cm above the carina. NG tube is in the stomach. Patchy bilateral airspace disease, right greater than left, similar to prior study. Heart is normal size. No effusions or pneumothorax. IMPRESSION: Support devices in expected position as above. Bilateral airspace disease, not significantly changed since prior study. Electronically Signed   By: Rolm Baptise M.D.   On: 09/25/2020 03:54   DG Chest Port 1 View  Result Date: 09/30/2020 CLINICAL DATA:  COVID-19 EXAM: PORTABLE CHEST 1 VIEW COMPARISON:  01/10/2011 FINDINGS: Multifocal consolidation, worst in the lower  right lung. No pneumothorax or sizable pleural effusion. Normal cardiomediastinal contours. IMPRESSION: Multifocal pneumonia. Electronically Signed   By: Ulyses Jarred M.D.   On: 09/22/2020 20:15   ECHOCARDIOGRAM COMPLETE  Result Date: 09/26/2020    ECHOCARDIOGRAM REPORT   Patient Name:   SHATANA SAXTON [facility] Date of Exam: 09/26/2020 Medical Rec #:  [id number]           Height:       64.0 in Accession #:    [id number]          Weight:       142.2 lb Date of Birth:  [birth year]            BSA:          1.692 m Patient Age:    86 years            BP:           85/61 mmHg Patient Gender: F                   HR:           87 bpm. Exam Location:  [facility] Procedure: 2D Echo Indications:     Acute Respiratory Distress  History:         Patient has no prior history of Echocardiogram examinations.                  Risk Factors:Hypertension.  Sonographer:     Wallace Keller Thornton-Maynard Referring Phys:  [id number] Awilda Bill Diagnosing Phys: Kate Sable MD IMPRESSIONS  1. Left ventricular ejection fraction, by estimation, is 55 to 60%. The left ventricle has normal function. The left ventricle has no regional wall motion abnormalities. Left ventricular diastolic parameters are indeterminate.  2. Right ventricular systolic function is low normal. The right ventricular size is mildly enlarged. There is normal pulmonary artery systolic pressure.  3. Right atrial size was moderately dilated.  4. The mitral valve is normal in structure. No evidence of mitral valve regurgitation.  5. The aortic valve is grossly normal. Aortic valve regurgitation is not visualized. FINDINGS  Left Ventricle: Left ventricular ejection fraction, by estimation, is 55 to 60%. The left ventricle has normal function. The left ventricle has no regional wall motion abnormalities. The left ventricular internal cavity size was normal in size. There is  no left ventricular hypertrophy. Left ventricular diastolic parameters are indeterminate. Right  Ventricle: The right ventricular size is mildly enlarged. No increase in right ventricular wall thickness. Right ventricular systolic function is low normal. There is normal pulmonary artery systolic pressure. The tricuspid regurgitant velocity is 2.49 m/s, and with an assumed right atrial pressure of 3 mmHg, the estimated right ventricular systolic pressure is 76.8 mmHg. Left Atrium: Left atrial size was normal in size. Right Atrium: Right atrial size was moderately dilated. Pericardium: There is no evidence of pericardial effusion. Mitral Valve:  The mitral valve is normal in structure. No evidence of mitral valve regurgitation. Tricuspid Valve: The tricuspid valve is normal in structure. Tricuspid valve regurgitation is mild. Aortic Valve: The aortic valve is grossly normal. Aortic valve regurgitation is not visualized. Aortic valve peak gradient measures 7.3 mmHg. Pulmonic Valve: The pulmonic valve was not well visualized. Pulmonic valve regurgitation is not visualized. Aorta: The aortic root is normal in size and structure. Venous: IVC assessment for right atrial pressure unable to be performed due to mechanical ventilation. IAS/Shunts: No atrial level shunt detected by color flow Doppler.  LEFT VENTRICLE PLAX 2D LVIDd:         4.28 cm  Diastology LVIDs:         3.30 cm  LV e' medial:    5.66 cm/s LV PW:         0.98 cm  LV E/e' medial:  10.7 LV IVS:        1.03 cm  LV e' lateral:   7.40 cm/s LVOT diam:     1.80 cm  LV E/e' lateral: 8.2 LV SV:         39 LV SV Index:   23 LVOT Area:     2.54 cm  RIGHT VENTRICLE RV Basal diam:  4.18 cm RV S prime:     7.29 cm/s TAPSE (M-mode): 1.2 cm LEFT ATRIUM             Index       RIGHT ATRIUM           Index LA diam:        2.70 cm 1.60 cm/m  RA Area:     23.30 cm LA Vol (A2C):   42.7 ml 25.23 ml/m RA Volume:   80.80 ml  47.74 ml/m LA Vol (A4C):   26.8 ml 15.84 ml/m LA Biplane Vol: 33.5 ml 19.79 ml/m  AORTIC VALVE                PULMONIC VALVE AV Area (Vmax): 1.49  cm    PV Vmax:       0.72 m/s AV Vmax:        135.00 cm/s PV Peak grad:  2.1 mmHg AV Peak Grad:   7.3 mmHg LVOT Vmax:      79.00 cm/s LVOT Vmean:     54.600 cm/s LVOT VTI:       0.153 m  AORTA Ao Root diam: 3.00 cm MITRAL VALVE               TRICUSPID VALVE MV Area (PHT): 4.68 cm    TR Peak grad:   24.8 mmHg MV E velocity: 60.40 cm/s  TR Vmax:        249.00 cm/s MV A velocity: 63.40 cm/s MV E/A ratio:  0.95        SHUNTS                            Systemic VTI:  0.15 m                            Systemic Diam: 1.80 cm Kate Sable MD Electronically signed by Kate Sable MD Signature Date/Time: 09/26/2020/11:49:11 AM    Final    [place] EKG SITE RITE  Result Date: 09/25/2020 If Site Rite image not attached, placement could not be confirmed due to current cardiac rhythm.    Assessment and  Recommendation  59 y.o. female with acute respiratory failure due to Covid infection with CT showing diffuse infiltrates for Covid and pulmonary embolism status post appropriate medication management with myocardial injury without evidence of acute coronary syndrome right ventricular and right atrial enlargement likely secondary to pulmonary embolism and hypoxia with episodes atrial flutter with rapid ventricular rate nonsustained at this time after treatment with beta-blocker and amiodarone 1.  No further cardiac intervention for previous atrial flutter with rapid ventricular rate other than below due to likely being initiated by current illness which slowly is improving 2.  Continuation of beta-blocker for maintenance of normal sinus rhythm and reduction of episodes of supraventricular tachycardia 3.  No further cardiac diagnostics necessary at this time 4.  Continue supportive care of kidney injury, infection, hypoxia, respiratory failure, and pulmonary embolism which all will improve cardiovascular status 5.  Please call if further questions or further need an assessment and treatment options of  above  Signed, Serafina Royals M.D. FACC

## 2020-10-06 DIAGNOSIS — J9601 Acute respiratory failure with hypoxia: Secondary | ICD-10-CM | POA: Diagnosis not present

## 2020-10-06 LAB — BLOOD GAS, VENOUS
Acid-Base Excess: 3.5 mmol/L — ABNORMAL HIGH (ref 0.0–2.0)
Bicarbonate: 29.8 mmol/L — ABNORMAL HIGH (ref 20.0–28.0)
FIO2: 0.6
MECHVT: 500 mL
O2 Saturation: 81.2 %
PEEP: 8 cmH2O
Patient temperature: 37
RATE: 32 resp/min
pCO2, Ven: 54 mmHg (ref 44.0–60.0)
pH, Ven: 7.35 (ref 7.250–7.430)
pO2, Ven: 48 mmHg — ABNORMAL HIGH (ref 32.0–45.0)

## 2020-10-06 LAB — BLOOD GAS, ARTERIAL
Acid-Base Excess: 5.7 mmol/L — ABNORMAL HIGH (ref 0.0–2.0)
Bicarbonate: 31.1 mmol/L — ABNORMAL HIGH (ref 20.0–28.0)
FIO2: 0.55
O2 Saturation: 90.7 %
PEEP: 5 cmH2O
Patient temperature: 37
Pressure control: 8 cmH2O
RATE: 15 resp/min
pCO2 arterial: 48 mmHg (ref 32.0–48.0)
pH, Arterial: 7.42 (ref 7.350–7.450)
pO2, Arterial: 59 mmHg — ABNORMAL LOW (ref 83.0–108.0)

## 2020-10-06 LAB — HEPARIN LEVEL (UNFRACTIONATED): Heparin Unfractionated: 0.38 IU/mL (ref 0.30–0.70)

## 2020-10-06 LAB — RENAL FUNCTION PANEL
Albumin: 2.4 g/dL — ABNORMAL LOW (ref 3.5–5.0)
Anion gap: 9 (ref 5–15)
BUN: 88 mg/dL — ABNORMAL HIGH (ref 6–20)
CO2: 29 mmol/L (ref 22–32)
Calcium: 8.6 mg/dL — ABNORMAL LOW (ref 8.9–10.3)
Chloride: 108 mmol/L (ref 98–111)
Creatinine, Ser: 2.5 mg/dL — ABNORMAL HIGH (ref 0.44–1.00)
GFR, Estimated: 21 mL/min — ABNORMAL LOW (ref >60)
Glucose, Bld: 130 mg/dL — ABNORMAL HIGH (ref 70–99)
Phosphorus: 4.1 mg/dL (ref 2.5–4.6)
Potassium: 4.2 mmol/L (ref 3.5–5.1)
Sodium: 146 mmol/L — ABNORMAL HIGH (ref 135–145)

## 2020-10-06 LAB — CBC
HCT: 27.8 % — ABNORMAL LOW (ref 36.0–46.0)
Hemoglobin: 8.7 g/dL — ABNORMAL LOW (ref 12.0–15.0)
MCH: 28.6 pg (ref 26.0–34.0)
MCHC: 31.3 g/dL (ref 30.0–36.0)
MCV: 91.4 fL (ref 80.0–100.0)
Platelets: 226 10*3/uL (ref 150–400)
RBC: 3.04 MIL/uL — ABNORMAL LOW (ref 3.87–5.11)
RDW: 15.4 % (ref 11.5–15.5)
WBC: 11.8 10*3/uL — ABNORMAL HIGH (ref 4.0–10.5)
nRBC: 0 % (ref 0.0–0.2)

## 2020-10-06 LAB — GLUCOSE, CAPILLARY
Glucose-Capillary: 93 mg/dL (ref 70–99)
Glucose-Capillary: 108 mg/dL — ABNORMAL HIGH (ref 70–99)
Glucose-Capillary: 134 mg/dL — ABNORMAL HIGH (ref 70–99)
Glucose-Capillary: 183 mg/dL — ABNORMAL HIGH (ref 70–99)
Glucose-Capillary: 197 mg/dL — ABNORMAL HIGH (ref 70–99)
Glucose-Capillary: 178 mg/dL — ABNORMAL HIGH (ref 70–99)

## 2020-10-06 LAB — PROCALCITONIN: Procalcitonin: 0.13 ng/mL

## 2020-10-06 LAB — MAGNESIUM: Magnesium: 1.9 mg/dL (ref 1.7–2.4)

## 2020-10-06 MED ORDER — SODIUM CHLORIDE 0.9 % IV SOLN
0.5000 mg/h | INTRAVENOUS | Status: DC
  Administered 2020-10-06: 12:00:00 1 mg/h via INTRAVENOUS
  Administered 2020-10-07: 09:00:00 2 mg/h via INTRAVENOUS
  Administered 2020-10-08: 3 mg/h via INTRAVENOUS
  Administered 2020-10-09 – 2020-10-16 (×14): 4 mg/h via INTRAVENOUS
  Administered 2020-10-16 – 2020-10-17 (×2): 2 mg/h via INTRAVENOUS
  Administered 2020-10-18 – 2020-10-19 (×2): 3 mg/h via INTRAVENOUS
  Administered 2020-10-20 (×2): 4 mg/h via INTRAVENOUS
  Administered 2020-10-21: 3 mg/h via INTRAVENOUS
  Filled 2020-10-06 (×24): qty 5

## 2020-10-06 MED ORDER — OXYCODONE HCL 5 MG PO TABS
15.0000 mg | ORAL_TABLET | Freq: Four times a day (QID) | ORAL | Status: DC
  Administered 2020-10-06 – 2020-10-16 (×40): 15 mg via ORAL
  Filled 2020-10-06 (×40): qty 3

## 2020-10-06 MED ORDER — CLONAZEPAM 1 MG PO TABS: 1.0000 mg | ORAL_TABLET | Freq: Two times a day (BID) | ORAL | Status: DC

## 2020-10-06 MED ORDER — QUETIAPINE FUMARATE 25 MG PO TABS
50.0000 mg | ORAL_TABLET | Freq: Two times a day (BID) | ORAL | Status: DC
  Administered 2020-10-06 – 2020-10-16 (×21): 50 mg via ORAL
  Filled 2020-10-06 (×22): qty 2

## 2020-10-06 NOTE — Consult Note (Signed)
ANTICOAGULATION CONSULT NOTE   Pharmacy Consult for Heparin Indication: DVT/PE  Patient Measurements: Heparin Dosing Weight: 63.5 kg  Labs: Recent Labs    10/04/20 0315 10/05/20 0451 10/06/20 0457 10/06/20 0500  HGB 10.3* 9.4*  --  8.7*  HCT 32.5* 30.2*  --  27.8*  PLT 370 288  --  226  HEPARINUNFRC 0.59 0.49  --  0.38  CREATININE 2.82* 2.42* 2.50*  --    Estimated Creatinine Clearance: 23.3 mL/min (A) (by C-G formula based on SCr of 2.5 mg/dL (H)).  Medical History: Past Medical History:  Diagnosis Date  . Anemia    noted during hip surgery   . Anxiety    states no longer issue  . Frequent headaches   . Hypertension   . Lumbago   . 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 Patient was admitted for acute hypoxic respiratory failure 2/2 PNA due to COVID-19. Pharmacy consulted for heparin dosing and monitoring for ACS and pulmonary embolism. CT chest with small pulmonary embolism right upper lobe. Trop elevated 260. CBC stable. No DOAC PTA.   Goal of Therapy:  Heparin level 0.3-0.7 units/ml Monitor platelets by anticoagulation protocol: Yes   12/17 0621 HL = 0.2, bolus 1000 units, increased rate to 900 units/hr  12/17 1802 HL = 0.26, bolus 900 units, increased rate to 1000 units/hr 12/18 0057 HL = 0.29, bolus 900 units, increased rate to 1100 units/hr 12/18 0814 HL = 0.42 x1, rate continued at 1100 units/hr 12/18 1500 HL = 0.40 x2 12/19 0511 HL = 0.58, therapeutic X 3  12/20 0322 HL = 1.84 , supratherapeutic -1100 12/20 0610 HL = 2.0     supratherapeutic-850 12/21 0621 HL = 0.36 therapeutic x 1 12/21 2336 HL = 0.23, subtherapeutic 12/22 0801 HL = 0.25, subtherapeutic 12/22 1643 HL = 0.37, therapeutic x 1 12/22 2339 HL = 0.31, therapeutic x 2 12/23 0626 HL = 0.10, subtherapeutic 12/23 1706 HL = 0.59, therapeutic x1 12/24  0049 HL = 0.42, therapeutic x 2 12/25 0502 HL = 0.66, therapeutic x 3, H/H slightly worse, PLTs ok 12/26 0315 HL = 0.59, therapeutic x 4.  CBC stable. 12/27 9451 HL = 0.49, therapeutic x 5.  CBC stable.  12/28 0500 HL = 0.38, therapeutic x 6.  H/H slightly worse, PLT dropped by ~22%   Plan:  HL therapeutic, continue current heparin drip rate at 1050 units/hr Re-check HL daily w/ CBC Daily CBC per protocol while on heparin infusion  Otelia Sergeant, PharmD, [facility] 10/06/2020 7:13 AM

## 2020-10-06 NOTE — Progress Notes (Signed)
[place], [place] 10/06/20  Subjective:   Hospital day # 12  BUN down to 88 today. Creatinine currently 2.5. Urine output of 5.3 L over the preceding 24 hours. Still on the ventilator.  Renal: 12/27 0701 - 12/28 0700 In: 2144.1 [I.V.:2044.1; IV Piggyback:100] Out: 8138 [Urine:5350; Stool:20] Lab Results  Component Value Date   CREATININE 2.50 (H) 10/06/2020   CREATININE 2.42 (H) 10/05/2020   CREATININE 2.82 (H) 10/04/2020     Objective:  Vital signs in last 24 hours:  Temp:  [97.88 F (36.6 C)-99.32 F (37.4 C)] 98.6 F (37 C) (12/28 0800) Pulse Rate:  [64-98] 79 (12/28 0800) Resp:  [13-35] 17 (12/28 0800) BP: (107-150)/(66-77) 134/73 (12/28 0800) SpO2:  [89 %-97 %] 91 % (12/28 0832) FiO2 (%):  [50 %-60 %] 60 % (12/28 0832) Weight:  [72.1 kg] 72.1 kg (12/28 0500)  Weight change: 0.5 kg Filed Weights   10/04/20 0500 10/05/20 0500 10/06/20 0500  Weight: 71.7 kg 71.6 kg 72.1 kg    Intake/Output:    Intake/Output Summary (Last 24 hours) at 10/06/2020 0914 Last data filed at 10/06/2020 0800 Gross per 24 hour  Intake 10974.13 ml  Output 5690 ml  Net 5284.13 ml    Physical Exam: General:  Critically ill-appearing  HEENT  ETT in place, OGT  Pulm/lungs  vent assisted  CVS/Heart  regular rhythm  Abdomen:   Nondistended  Extremities:  + peripheral edema  Neurologic:  sedated  Skin:  No acute rashes  Foley in place Rectal tube   Basic Metabolic Panel:  Recent Labs  Lab 10/01/20 0626 10/01/20 1723 10/02/20 0418 10/02/20 8719 10/03/20 0502 10/04/20 0315 10/05/20 0451 10/06/20 0457 10/06/20 0500  NA 149* 145 149*  --  150* 151* 149* 146*  --   K 4.4 4.7 4.0  --  4.2 4.2 4.6 4.2  --   CL 112* 109 111  --  112* 112* 112* 108  --   CO2 '22 22 24  ' --  '25 29 27 29  ' --   GLUCOSE 126* 257* 119*  --  192* 135* 118* 130*  --   BUN 116* 113* 120*  --  113* 104* 97* 88*  --   CREATININE 3.90* 3.89* 3.58*  --  3.19* 2.82* 2.42*  2.50*  --   CALCIUM 8.5* 8.7* 8.7*  --  8.5* 8.7* 8.9 8.6*  --   MG 2.7* 2.6*  --  2.4  --  2.2 2.0  --  1.9  PHOS 6.7*  --  5.7*  --  5.4* 5.1*  --  4.1  --      CBC: Recent Labs  Lab 10/02/20 0418 10/03/20 0502 10/04/20 0315 10/05/20 0451 10/06/20 0500  WBC 11.7* 9.4 23.0* 13.0* 11.8*  HGB 10.1* 9.3* 10.3* 9.4* 8.7*  HCT 29.5* 28.6* 32.5* 30.2* 27.8*  MCV 84.8 86.9 89.5 91.5 91.4  PLT 331 285 370 288 226      Lab Results  Component Value Date   HEPBSAG NON REACTIVE 09/27/2020      Microbiology:  No results found for this or any previous visit (from the past 240 hour(s)).  Coagulation Studies: No results for input(s): LABPROT, INR in the last 72 hours.  Urinalysis: No results for input(s): COLORURINE, LABSPEC, PHURINE, GLUCOSEU, HGBUR, BILIRUBINUR, KETONESUR, PROTEINUR, UROBILINOGEN, NITRITE, LEUKOCYTESUR in the last 72 hours.  Invalid input(s): APPERANCEUR    Imaging: No results found.   Medications:   . sodium chloride 150 mL/hr at 09/26/20  2000  . ceFEPime (MAXIPIME) IV Stopped (10/05/20 2215)  . dexmedetomidine (PRECEDEX) IV infusion 1.2 mcg/kg/hr (10/06/20 0800)  . feeding supplement (VITAL 1.5 CAL) 1,000 mL (10/03/20 1201)  . fentaNYL infusion INTRAVENOUS 400 mcg/hr (10/06/20 0800)  . heparin 1,050 Units/hr (10/06/20 0800)  . midazolam 10 mg/hr (10/06/20 0800)   . vitamin C  500 mg Per Tube Daily  . chlorhexidine gluconate (MEDLINE KIT)  15 mL Mouth Rinse BID  . Chlorhexidine Gluconate Cloth  6 each Topical Daily  . feeding supplement (PROSource TF)  45 mL Per Tube Daily  . folic acid  1 mg Per Tube Daily  . free water  200 mL Per Tube Q2H  . insulin aspart  0-15 Units Subcutaneous Q4H  . insulin detemir  5 Units Subcutaneous BID  . mouth rinse  15 mL Mouth Rinse 10 times per day  . metoprolol tartrate  25 mg Oral BID  . multivitamin with minerals  1 tablet Per Tube Daily  . oxyCODONE  10 mg Oral Q6H  . pantoprazole (PROTONIX) IV  40 mg  Intravenous Q24H  . predniSONE  40 mg Oral Q breakfast   Followed by  . [START ON 10/11/2020] predniSONE  30 mg Oral Q breakfast   Followed by  . [START ON 10/16/2020] predniSONE  20 mg Oral Q breakfast   Followed by  . [START ON 11-10-2020] predniSONE  10 mg Oral Q breakfast   Followed by  . [START ON 10/26/2020] predniSONE  5 mg Oral Q breakfast  . thiamine  100 mg Per Tube Daily  . zinc sulfate  220 mg Per Tube Daily   acetaminophen, fentaNYL, heparin, labetalol, midazolam, morphine injection, ondansetron **OR** ondansetron (ZOFRAN) IV  Assessment/ Plan:  59 y.o. female with  hypertension, anxiety, anemia who was brought to the ER on December 16 with a chief complaint of cough shortness of breath   admitted on 10/07/2020 for Edema [R60.9] Lactic acid acidosis [E87.2] Acute respiratory failure with hypoxia ([place]) [J96.01] Pneumonia due to COVID-19 virus [U07.1, J12.82]   # Acute Kidney injury with proteinuria AKI likely secondary to ATN and iv contrast exposure (12/172021) 12/27 0701 - 12/28 0700 In: 2144.1 [I.V.:2044.1; IV Piggyback:100] Out: 1287 [Urine:5350; Stool:20]   Lab Results  Component Value Date   CREATININE 2.50 (H) 10/06/2020   CREATININE 2.42 (H) 10/05/2020   CREATININE 2.82 (H) 10/04/2020    Creatinine slightly higher today at 2.5 however BUN down to 88.  Urine  #Hypernatremia Due to brisk diuresis -Sodium now down to 146.  Water flush.  # Covid 19 pneumonia Complicated by Acute resp failure with hypoxia Small pulmonary embolism right upper lobe pulmonary arteries in a distal pulmonary artery- requiring iv anticoagulation Currently requiring 50% FiO2.  # hyperphosphatemia Lab Results  Component Value Date   PTH 109 (H) 09/29/2020   CALCIUM 8.6 (L) 10/06/2020   PHOS 4.1 10/06/2020    Phosphorus now down to 4.1.  Continue to monitor bone mineral metabolism parameters.     LOS: 12 Margaret Shepard 12/28/20219:14 AM  [place], [place]  Note: This note was prepared with Dragon dictation. Any transcription errors are unintentional

## 2020-10-06 NOTE — Progress Notes (Signed)
CRITICAL CARE NOTE  59 yo female admitted with acute hypoxic respiratory failure secondary to pneumonia due to COVID-19 requiring HHFNC and NRB now intubated and severe resp failure complicated by DVT and PE  12/16 intubated 12/17 high risk for cardiac arrest, on pressors 12/18 Off pressors 12/19 worsening renal function.  Nephrology consult 09/28/20- patient was noted to be severely acidemic with HAGMA in am s/p 2 amp bicarb with repeat ABG significantly improved 12/21- patient will require HD, will place vascath today. Meds reviewed and changed, TG elevated on propofol. DCd IVF. 12/22-  Patient remains critically ill. Reviewed plan with nephrologist, patient will not have HD today. 10/01/20- patient on 35%FIo2 8peep, she diruesed >3L we will hold HD, continue diuresis. SBT.  10/02/20- patient with tachyarrythmia overnight.  S/p cardiology evaluation. For SBT today.  10/03/20- patient with no overnight events, remains critically ill on MV.  Diuresed well overnight.  Discussed care plan with nephrologist today.  10/04/20-  Patient remains critically ill.  She had SBT daily and has not been able to pass yet.  10/05/20-with no acute events overnight remains on PEEP of 8 and FiO2 of 55%  10/06/20-Patient on peep of 8 and FiO2 60%. Trying to weansedation but very difficult in spite of using oral medicaitons   CC  follow up respiratory failure  SUBJECTIVE FiO2 down to 35% Dyssynchronous with the ventilator Renal function is slightly improved  Vent Mode: PRVC FiO2 (%):  [50 %-60 %] 60 % Set Rate:  [15 bmp-32 bmp] 32 bmp Vt Set:  [500 mL-520 mL] 500 mL PEEP:  [5 cmH20-8 cmH20] 8 cmH20 Plateau Pressure:  [23 cmH20-26 cmH20] 23 cmH20  CBC    Component Value Date/Time   WBC 11.8 (H) 10/06/2020 0500   RBC 3.04 (L) 10/06/2020 0500   HGB 8.7 (L) 10/06/2020 0500   HGB 13.6 07/08/2012 2019   HCT 27.8 (L) 10/06/2020 0500   HCT 39.4 07/08/2012 2019   PLT 226 10/06/2020 0500   PLT 202  07/08/2012 2019   MCV 91.4 10/06/2020 0500   MCV 91 07/08/2012 2019   MCH 28.6 10/06/2020 0500   MCHC 31.3 10/06/2020 0500   RDW 15.4 10/06/2020 0500   RDW 12.6 07/08/2012 2019   LYMPHSABS 1.3 09/26/2020 0426   MONOABS 0.7 09/26/2020 0426   EOSABS 0.0 09/26/2020 0426   BASOSABS 0.0 09/26/2020 0426   BMP Latest Ref Rng & Units 10/06/2020 10/05/2020 10/04/2020  Glucose 70 - 99 mg/dL 614(E) 315(Q) 008(Q)  BUN 6 - 20 mg/dL 76(P) 95(K) 932(I)  Creatinine 0.44 - 1.00 mg/dL 7.12(W) 5.80(D) 9.83(J)  Sodium 135 - 145 mmol/L 146(H) 149(H) 151(H)  Potassium 3.5 - 5.1 mmol/L 4.2 4.6 4.2  Chloride 98 - 111 mmol/L 108 112(H) 112(H)  CO2 22 - 32 mmol/L 29 27 29   Calcium 8.9 - 10.3 mg/dL ) 8.9 8.2(N)     BP 134/73 (BP Location: Right Arm)   Pulse 79   Temp 98.6 F (37 C) (Esophageal)   Resp 17   Ht 5\' 4"  (1.626 m)   Wt 72.1 kg   SpO2 91%   BMI 27.28 kg/m    I/O last 3 completed shifts: In: 2889.3 [I.V.:2689.9; IV Piggyback:199.4] Out: 6020 [Urine:6000; Stool:20] Total I/O In: 8830 [I.V.:82.7; Other:20; NG/GT:8727.3] Out: 320 [Urine:320]  SpO2: 91 % O2 Flow Rate (L/min): 15 L/min FiO2 (%): 60 %  Estimated body mass index is 27.28 kg/m as calculated from the following:   Height as of this encounter: 5\' 4"  (1.626 m).  Weight as of this encounter: 72.1 kg.  SIGNIFICANT EVENTS   REVIEW OF SYSTEMS  PATIENT IS UNABLE TO PROVIDE COMPLETE REVIEW OF SYSTEMS DUE TO SEVERE CRITICAL ILLNESS    PHYSICAL EXAMINATION: Gen:      No acute distress HEENT:  EOMI, sclera anicteric Neck:     No masses; no thyromegaly, ET tube Lungs:    Rhonchi b/l CV:         Regular rate and rhythm; no murmurs Abd:      + bowel sounds; soft, non-tender; no palpable masses, no distension Ext:    No edema; adequate peripheral perfusion Skin:      Warm and dry; no rash Neuro: Sedated, unresponsive GCS4T    MEDICATIONS: I have reviewed all medications and confirmed regimen as  documented   CULTURE RESULTS   No results found for this or any previous visit (from the past 240 hour(s)).        IMAGING    No results found.   Nutrition Status: Nutrition Problem: Inadequate oral intake Etiology: inability to eat Signs/Symptoms: NPO status Interventions: Tube feeding,Prostat,MVI     Indwelling Urinary Catheter continued, requirement due to   Reason to continue Indwelling Urinary Catheter strict Intake/Output monitoring for hemodynamic instability   Central Line/ continued, requirement due to  Reason to continue [employer] Monitoring of central venous pressure or other hemodynamic parameters and poor IV access   Ventilator continued, requirement due to severe respiratory failure   Ventilator Sedation RASS 0 to -2      ASSESSMENT AND PLAN SYNOPSIS 59 year old with ARDS, COVID-19 pneumonia, bilateral DVT and small right upper lobe PE   Acute respiratory failure with hypoxemia status post remdesivir  And barcinitib Acute COVID19 pneumonia -supportive care with ICU telemetry monitoring -PT/OT when possible -Use intermittent paralytics for vent dyssynchrony. -Goal plateau pressure less than 30, goal driving pressure less than 15 -On 40 of prednisone have put in taper to wean -We will continue p.o. Roxicodone and try to increase  as patient does not tolerate weaning sedation for SBT -Additionally will attempt to speak with family as patient will likely need trach if she continues to have issues with weaning sedation for SBT.   NEUROLOGY Acute toxic metabolic encephalopathy, need for sedation Continue fentanyl, propofol. Increase RASS goal to -2-3 as she is dyssynchronous with the vent Intermittent paralytics  SHOCK-SEPSIS Off pressors   CARDIAC ICU monitoring  ID Received ceftriaxone, azithromycin from 12/16- 12/17 1/2 blood cultures are growing staph hominis.  This is likely a contaminant DC vancomycin as kidney function is getting  worse  GI GI PROPHYLAXIS as indicated   DIET-->TF's as tolerated Constipation protocol as indicated   ENDO - will use ICU hypoglycemic\Hyperglycemia protocol if indicated Continue Levemir, SSI   DVT/PE Continue heparin drip  RENAL AKI Creatinine continues to increase. Nephrology consult.  Likely headed towards dialysis but no acute need to start today  ELECTROLYTES -follow labs as needed -replace as needed -pharmacy consultation and following  GOALS OF CARE Updated friend Kennon Rounds and sister Alvira Philips on 12/19 over telephone.  No answer when I called brother Duane Discussed worsening clinical status, possible need for dialysis  Decision made to change status to limited code with no CPR or shocks in case of cardiac arrest. We will try dialysis on a time-limited basis if needed but no prolonged mechanical or dialysis support.   DVT/GI PRX ordered and assessed TRANSFUSIONS AS NEEDED MONITOR FSBS I Assessed the need for Labs I Assessed the need  for Foley I Assessed the need for Central Venous Line Family Discussion when available I Assessed the need for Mobilization I made an Assessment of medications to be adjusted accordingly Safety Risk assessment completed   CASE DISCUSSED IN MULTIDISCIPLINARY ROUNDS WITH ICU TEAM  The patient is critically ill with multiple organ system failure and requires high complexity decision making for assessment and support, frequent evaluation and titration of therapies, advanced monitoring, review of radiographic studies and interpretation of complex data.   Critical Care Time devoted to patient care services, exclusive of separately billable procedures, described in this note is 33 minutes.    Vertis Kelch DO Internal Medicine/Pediatrics Pulmonary and Critical Care Fellow PGY-7

## 2020-10-06 NOTE — Plan of Care (Signed)
Patient remains intubated and sedated. FiO2 requirements increased this AM.   Problem: Education: Goal: Knowledge of General Education information will improve Description: Including pain rating scale, medication(s)/side effects and non-pharmacologic comfort measures Outcome: Not Progressing   Problem: Health Behavior/Discharge Planning: Goal: Ability to manage health-related needs will improve Outcome: Not Progressing   Problem: Clinical Measurements: Goal: Ability to maintain clinical measurements within normal limits will improve Outcome: Not Progressing Goal: Will remain free from infection Outcome: Not Progressing Goal: Diagnostic test results will improve Outcome: Not Progressing Goal: Respiratory complications will improve Outcome: Not Progressing Goal: Cardiovascular complication will be avoided Outcome: Not Progressing   Problem: Activity: Goal: Risk for activity intolerance will decrease Outcome: Not Progressing   Problem: Nutrition: Goal: Adequate nutrition will be maintained Outcome: Not Progressing   Problem: Coping: Goal: Level of anxiety will decrease Outcome: Not Progressing   Problem: Elimination: Goal: Will not experience complications related to bowel motility Outcome: Not Progressing Goal: Will not experience complications related to urinary retention Outcome: Not Progressing   Problem: Pain Managment: Goal: General experience of comfort will improve Outcome: Not Progressing   Problem: Safety: Goal: Ability to remain free from injury will improve Outcome: Not Progressing   Problem: Skin Integrity: Goal: Risk for impaired skin integrity will decrease Outcome: Not Progressing

## 2020-10-07 DIAGNOSIS — J9601 Acute respiratory failure with hypoxia: Secondary | ICD-10-CM | POA: Diagnosis not present

## 2020-10-07 LAB — RENAL FUNCTION PANEL
Albumin: 2 g/dL — ABNORMAL LOW (ref 3.5–5.0)
Anion gap: 7 (ref 5–15)
BUN: 77 mg/dL — ABNORMAL HIGH (ref 6–20)
CO2: 30 mmol/L (ref 22–32)
Calcium: 8.3 mg/dL — ABNORMAL LOW (ref 8.9–10.3)
Chloride: 105 mmol/L (ref 98–111)
Creatinine, Ser: 2.16 mg/dL — ABNORMAL HIGH (ref 0.44–1.00)
GFR, Estimated: 26 mL/min — ABNORMAL LOW (ref >60)
Glucose, Bld: 121 mg/dL — ABNORMAL HIGH (ref 70–99)
Phosphorus: 4 mg/dL (ref 2.5–4.6)
Potassium: 4.2 mmol/L (ref 3.5–5.1)
Sodium: 142 mmol/L (ref 135–145)

## 2020-10-07 LAB — HEPARIN LEVEL (UNFRACTIONATED): Heparin Unfractionated: 0.38 IU/mL (ref 0.30–0.70)

## 2020-10-07 LAB — GLUCOSE, CAPILLARY
Glucose-Capillary: 103 mg/dL — ABNORMAL HIGH (ref 70–99)
Glucose-Capillary: 107 mg/dL — ABNORMAL HIGH (ref 70–99)
Glucose-Capillary: 118 mg/dL — ABNORMAL HIGH (ref 70–99)
Glucose-Capillary: 135 mg/dL — ABNORMAL HIGH (ref 70–99)
Glucose-Capillary: 161 mg/dL — ABNORMAL HIGH (ref 70–99)
Glucose-Capillary: 163 mg/dL — ABNORMAL HIGH (ref 70–99)
Glucose-Capillary: 188 mg/dL — ABNORMAL HIGH (ref 70–99)

## 2020-10-07 LAB — CBC
HCT: 26.8 % — ABNORMAL LOW (ref 36.0–46.0)
Hemoglobin: 8.5 g/dL — ABNORMAL LOW (ref 12.0–15.0)
MCH: 28.5 pg (ref 26.0–34.0)
MCHC: 31.7 g/dL (ref 30.0–36.0)
MCV: 89.9 fL (ref 80.0–100.0)
Platelets: 200 10*3/uL (ref 150–400)
RBC: 2.98 MIL/uL — ABNORMAL LOW (ref 3.87–5.11)
RDW: 14.9 % (ref 11.5–15.5)
WBC: 11 10*3/uL — ABNORMAL HIGH (ref 4.0–10.5)
nRBC: 0 % (ref 0.0–0.2)

## 2020-10-07 LAB — C-REACTIVE PROTEIN

## 2020-10-07 LAB — HIGH SENSITIVITY CRP: CRP, High Sensitivity: 70.75 mg/L — ABNORMAL HIGH (ref 0.00–3.00)

## 2020-10-07 MED ORDER — FREE WATER
200.0000 mL | Freq: Four times a day (QID) | Status: DC
  Administered 2020-10-07 – 2020-10-16 (×33): 200 mL

## 2020-10-07 MED ORDER — FUROSEMIDE 10 MG/ML IJ SOLN
40.0000 mg | Freq: Once | INTRAMUSCULAR | Status: AC
  Administered 2020-10-07: 12:00:00 40 mg via INTRAVENOUS
  Filled 2020-10-07: qty 4

## 2020-10-07 NOTE — Progress Notes (Signed)
CRITICAL CARE NOTE  59 yo female admitted with acute hypoxic respiratory failure secondary to pneumonia due to COVID-19 requiring HHFNC and NRB now intubated and severe resp failure complicated by DVT and PE  12/16 intubated 12/17 high risk for cardiac arrest, on pressors 12/18 Off pressors 12/19 worsening renal function.  Nephrology consult 09/28/20- patient was noted to be severely acidemic with HAGMA in am s/p 2 amp bicarb with repeat ABG significantly improved 12/21- patient will require HD, will place vascath today. Meds reviewed and changed, TG elevated on propofol. DCd IVF. 12/22-  Patient remains critically ill. Reviewed plan with nephrologist, patient will not have HD today. 10/01/20- patient on 35%FIo2 8peep, she diruesed >3L we will hold HD, continue diuresis. SBT.  10/02/20- patient with tachyarrythmia overnight.  S/p cardiology evaluation. For SBT today.  10/03/20- patient with no overnight events, remains critically ill on MV.  Diuresed well overnight.  Discussed care plan with nephrologist today.  10/04/20-  Patient remains critically ill.  She had SBT daily and has not been able to pass yet.  10/05/20-with no acute events overnight remains on PEEP of 8 and FiO2 of 55%  10/06/20-Patient on peep of 8 and FiO2 60%. Trying to weansedation but very difficult in spite of using oral medications  10/07/20-patient tolerated 5/5 trial for over 30 minutes.  She was able to follow commands today.  She was able to wiggle her right toes.  She was able to stick out her tongue.   CC  follow up respiratory failure  SUBJECTIVE FiO2 down to 35% Dyssynchronous with the ventilator Renal function is slightly improved  Vent Mode: PRVC FiO2 (%):  [50 %] 50 % Set Rate:  [32 bmp] 32 bmp Vt Set:  [500 mL] 500 mL PEEP:  [5 cmH20-8 cmH20] 5 cmH20 Pressure Support:  [5 cmH20] 5 cmH20 Plateau Pressure:  [22 cmH20-31 cmH20] 22 cmH20  CBC    Component Value Date/Time   WBC 11.0 (H) 10/07/2020  0332   RBC 2.98 (L) 10/07/2020 0332   HGB 8.5 (L) 10/07/2020 0332   HGB 13.6 07/08/2012 2019   HCT 26.8 (L) 10/07/2020 0332   HCT 39.4 07/08/2012 2019   PLT 200 10/07/2020 0332   PLT 202 07/08/2012 2019   MCV 89.9 10/07/2020 0332   MCV 91 07/08/2012 2019   MCH 28.5 10/07/2020 0332   MCHC 31.7 10/07/2020 0332   RDW 14.9 10/07/2020 0332   RDW 12.6 07/08/2012 2019   LYMPHSABS 1.3 09/26/2020 0426   MONOABS 0.7 09/26/2020 0426   EOSABS 0.0 09/26/2020 0426   BASOSABS 0.0 09/26/2020 0426   BMP Latest Ref Rng & Units 10/07/2020 10/06/2020 10/05/2020  Glucose 70 - 99 mg/dL 798(X) 211(H) 417(E)  BUN 6 - 20 mg/dL 08(X) 44(Y) 18(H)  Creatinine 0.44 - 1.00 mg/dL 6.31(S) 9.70(Y) 6.37(C)  Sodium 135 - 145 mmol/L 142 146(H) 149(H)  Potassium 3.5 - 5.1 mmol/L 4.2 4.2 4.6  Chloride 98 - 111 mmol/L 105 108 112(H)  CO2 22 - 32 mmol/L 30 29 27   Calcium 8.9 - 10.3 mg/dL 8.3(L) 8.6(L) 8.9     BP (!) 155/82 (BP Location: Right Arm)   Pulse (!) 102   Temp 98.6 F (37 C) (Esophageal)   Resp (!) 27   Ht 5\' 4"  (1.626 m)   Wt 75 kg   SpO2 95%   BMI 28.38 kg/m    I/O last 3 completed shifts: In: 15342.1 [I.V.:2634.7; Other:140; ; IV Piggyback:200.1] Out: 6060 [Urine:6030; Stool:30] Total I/O In: 213.5 [  I.V.:103.5; NG/GT:110] Out: 1850 [Urine:1850]  SpO2: 95 % O2 Flow Rate (L/min): 15 L/min FiO2 (%): 50 %  Estimated body mass index is 28.38 kg/m as calculated from the following:   Height as of this encounter: 5\' 4"  (1.626 m).   Weight as of this encounter: 75 kg.  SIGNIFICANT EVENTS   REVIEW OF SYSTEMS  PATIENT IS UNABLE TO PROVIDE COMPLETE REVIEW OF SYSTEMS DUE TO SEVERE CRITICAL ILLNESS    PHYSICAL EXAMINATION: Gen:      No acute distress HEENT:  EOMI, sclera anicteric Neck:     No masses; no thyromegaly, ET tube Lungs:    Rhonchi b/l CV:         Regular rate and rhythm; no murmurs Abd:      + bowel sounds; soft, non-tender; no palpable masses, no  distension Ext:    No edema; adequate peripheral perfusion Skin:      Warm and dry; no rash Neuro: Sedated, unresponsive GCS4T    MEDICATIONS: I have reviewed all medications and confirmed regimen as documented   CULTURE RESULTS   No results found for this or any previous visit (from the past 240 hour(s)).        IMAGING    No results found.   Nutrition Status: Nutrition Problem: Inadequate oral intake Etiology: inability to eat Signs/Symptoms: NPO status Interventions: Tube feeding,Prostat,MVI     Indwelling Urinary Catheter continued, requirement due to   Reason to continue Indwelling Urinary Catheter strict Intake/Output monitoring for hemodynamic instability   Central Line/ continued, requirement due to  Reason to continue Monitoring of central venous pressure or other hemodynamic parameters and poor IV access   Ventilator continued, requirement due to severe respiratory failure   Ventilator Sedation RASS 0 to -2      ASSESSMENT AND PLAN SYNOPSIS 59 year old with ARDS, COVID-19 pneumonia, bilateral DVT and small right upper lobe PE   Acute respiratory failure with hypoxemia status post remdesivir  And barcinitib Acute COVID19 pneumonia -supportive care with ICU telemetry monitoring -PT/OT when possible -Use intermittent paralytics for vent dyssynchrony. -Goal plateau pressure less than 30, goal driving pressure less than 15 -On 40 of prednisone have put in taper to wean -We will continue p.o. Roxicodone and try to increase  as patient does not tolerate weaning sedation for SBT -Have spoken with the patient's family concerning possible tracheostomy in the future they are discussing amongst themselves whether they would do this or not -Spoke with 66 who had spoken to the rest  Of the family. They would be okay with patient trach if needed but given that patient followed commands and did successfully do SBT for 30 minutes today we will  hold off on consult in hopes of extubation in the future.   NEUROLOGY Acute toxic metabolic encephalopathy, need for sedation Continue fentanyl, propofol. Increase RASS goal to -2-3 as she is dyssynchronous with the vent Intermittent paralytics  SHOCK-SEPSIS Off pressors   CARDIAC ICU monitoring  ID Received ceftriaxone, azithromycin from 12/16- 12/17 1/2 blood cultures are growing staph hominis.  This is likely a contaminant DC vancomycin as kidney function is getting worse  GI GI PROPHYLAXIS as indicated   DIET-->TF's as tolerated Constipation protocol as indicated   ENDO - will use ICU hypoglycemic\Hyperglycemia protocol if indicated Continue Levemir, SSI   DVT/PE Continue heparin drip  RENAL AKI Creatinine continues to increase. Nephrology consult.  Likely headed towards dialysis but no acute need to start today  ELECTROLYTES -follow labs as needed -  replace as needed -pharmacy consultation and following  GOALS OF CARE Updated friend Kennon Rounds and sister Alvira Philips on 12/19 over telephone.  No answer when I called brother Duane Discussed worsening clinical status, possible need for dialysis  Decision made to change status to limited code with no CPR or shocks in case of cardiac arrest. We will try dialysis on a time-limited basis if needed but no prolonged mechanical or dialysis support.   DVT/GI PRX ordered and assessed TRANSFUSIONS AS NEEDED MONITOR FSBS I Assessed the need for Labs I Assessed the need for Foley I Assessed the need for Central Venous Line Family Discussion when available I Assessed the need for Mobilization I made an Assessment of medications to be adjusted accordingly Safety Risk assessment completed   CASE DISCUSSED IN MULTIDISCIPLINARY ROUNDS WITH ICU TEAM  The patient is critically ill with multiple organ system failure and requires high complexity decision making for assessment and support, frequent evaluation and titration of  therapies, advanced monitoring, review of radiographic studies and interpretation of complex data.   Critical Care Time devoted to patient care services, exclusive of separately billable procedures, described in this note is 33 minutes.    Vertis Kelch DO Internal Medicine/Pediatrics Pulmonary and Critical Care Fellow PGY-7

## 2020-10-07 NOTE — Consult Note (Signed)
ANTICOAGULATION CONSULT NOTE   Pharmacy Consult for Heparin Indication: DVT/PE  Patient Measurements: Heparin Dosing Weight: 63.5 kg  Labs: Recent Labs    10/05/20 0451 10/06/20 0457 10/06/20 0500 10/07/20 0332  HGB 9.4*  --  8.7* 8.5*  HCT 30.2*  --  27.8* 26.8*  PLT 288  --  226 200  HEPARINUNFRC 0.49  --  0.38 0.38  CREATININE 2.42* 2.50*  --   --    Estimated Creatinine Clearance: 23.7 mL/min (A) (by C-G formula based on SCr of 2.5 mg/dL (H)).  Medical History: Past Medical History:  Diagnosis Date  . Anemia    noted during hip surgery   . Anxiety    states no longer issue  . Frequent headaches   . Hypertension   . Lumbago   . 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 Patient was admitted for acute hypoxic respiratory failure 2/2 PNA due to COVID-19. Pharmacy consulted for heparin dosing and monitoring for ACS and pulmonary embolism. CT chest with small pulmonary embolism right upper lobe. Trop elevated 260. CBC stable. No DOAC PTA.   Goal of Therapy:  Heparin level 0.3-0.7 units/ml Monitor platelets by anticoagulation protocol: Yes   12/17 0621 HL = 0.2, bolus 1000 units, increased rate to 900 units/hr  12/17 1802 HL = 0.26, bolus 900 units, increased rate to 1000 units/hr 12/18 0057 HL = 0.29, bolus 900 units, increased rate to 1100 units/hr 12/18 0814 HL = 0.42 x1, rate continued at 1100 units/hr 12/18 1500 HL = 0.40 x2 12/19 0511 HL = 0.58, therapeutic X 3  12/20 0322 HL = 1.84 , supratherapeutic -1100 12/20 0610 HL = 2.0     supratherapeutic-850 12/21 0621 HL = 0.36 therapeutic x 1 12/21 2336 HL = 0.23, subtherapeutic 12/22 0801 HL = 0.25, subtherapeutic 12/22 1643 HL = 0.37, therapeutic x 1 12/22 2339 HL = 0.31, therapeutic x 2 12/23 0626 HL = 0.10, subtherapeutic 12/23 1706 HL = 0.59, therapeutic x1 12/24 0049  HL = 0.42, therapeutic x 2 12/25 0502 HL = 0.66, therapeutic x 3, H/H slightly worse, PLTs ok 12/26 0315 HL = 0.59, therapeutic x 4.  CBC stable. 12/27 9451 HL = 0.49, therapeutic x 5.  CBC stable.  12/28 0500 HL = 0.38, therapeutic x 6.  H/H slightly worse, PLT dropped by ~22% 12/29 0332 HL = 0.38, therapeutic x 7   Plan:  HL therapeutic, continue current heparin drip rate at 1050 units/hr Re-check HL daily w/ CBC Daily CBC per protocol while on heparin infusion  Otelia Sergeant, PharmD, [facility] 10/07/2020 7:29 AM

## 2020-10-07 NOTE — Progress Notes (Signed)
[place], [place] 10/07/20  Subjective:   Hospital day # 13  Patient still on the ventilator. Good urine output at 2.8 L over the preceding 24 hours noted. No new creatinine this AM.  Renal: 12/28 0701 - 12/29 0700 In: 14188 [I.V.:1580.6; XB/LT:90300.9; IV Piggyback:100.1] Out: 2890 [Urine:2880; Stool:10] Lab Results  Component Value Date   CREATININE 2.50 (H) 10/06/2020   CREATININE 2.42 (H) 10/05/2020   CREATININE 2.82 (H) 10/04/2020     Objective:  Vital signs in last 24 hours:  Temp:  [96.62 F (35.9 C)-99.32 F (37.4 C)] 98.96 F (37.2 C) (12/29 0800) Pulse Rate:  [58-90] 73 (12/29 0800) Resp:  [16-29] 25 (12/29 0800) BP: (111-162)/(64-82) 139/78 (12/29 0800) SpO2:  [91 %-98 %] 91 % (12/29 0800) FiO2 (%):  [50 %-60 %] 50 % (12/29 0254) Weight:  [75 kg] 75 kg (12/29 0500)  Weight change: 2.9 kg Filed Weights   10/05/20 0500 10/06/20 0500 10/07/20 0500  Weight: 71.6 kg 72.1 kg 75 kg    Intake/Output:    Intake/Output Summary (Last 24 hours) at 10/07/2020 0857 Last data filed at 10/07/2020 0600 Gross per 24 hour  Intake 5357.98 ml  Output 2570 ml  Net 2787.98 ml    Physical Exam: General:  Critically ill-appearing  HEENT  ETT in place, OGT  Pulm/lungs  vent assisted FiO2 50%, respiratory rate 22  CVS/Heart  regular rhythm  Abdomen:   Nondistended  Extremities:  + peripheral edema  Neurologic:  sedated  Skin:  No acute rashes  Foley in place Rectal tube   Basic Metabolic Panel:  Recent Labs  Lab 10/01/20 0626 10/01/20 1723 10/02/20 0418 10/02/20 2330 10/03/20 0502 10/04/20 0315 10/05/20 0451 10/06/20 0457 10/06/20 0500  NA 149* 145 149*  --  150* 151* 149* 146*  --   K 4.4 4.7 4.0  --  4.2 4.2 4.6 4.2  --   CL 112* 109 111  --  112* 112* 112* 108  --   CO2 '22 22 24  ' --  '25 29 27 29  ' --   GLUCOSE 126* 257* 119*  --  192* 135* 118* 130*  --   BUN 116* 113* 120*  --  113* 104* 97* 88*  --   CREATININE  3.90* 3.89* 3.58*  --  3.19* 2.82* 2.42* 2.50*  --   CALCIUM 8.5* 8.7* 8.7*  --  8.5* 8.7* 8.9 8.6*  --   MG 2.7* 2.6*  --  2.4  --  2.2 2.0  --  1.9  PHOS 6.7*  --  5.7*  --  5.4* 5.1*  --  4.1  --      CBC: Recent Labs  Lab 10/03/20 0502 10/04/20 0315 10/05/20 0451 10/06/20 0500 10/07/20 0332  WBC 9.4 23.0* 13.0* 11.8* 11.0*  HGB 9.3* 10.3* 9.4* 8.7* 8.5*  HCT 28.6* 32.5* 30.2* 27.8* 26.8*  MCV 86.9 89.5 91.5 91.4 89.9  PLT 285 370 288 226 200      Lab Results  Component Value Date   HEPBSAG NON REACTIVE 09/27/2020      Microbiology:  No results found for this or any previous visit (from the past 240 hour(s)).  Coagulation Studies: No results for input(s): LABPROT, INR in the last 72 hours.  Urinalysis: No results for input(s): COLORURINE, LABSPEC, PHURINE, GLUCOSEU, HGBUR, BILIRUBINUR, KETONESUR, PROTEINUR, UROBILINOGEN, NITRITE, LEUKOCYTESUR in the last 72 hours.  Invalid input(s): APPERANCEUR    Imaging: No results found.   Medications:   .  sodium chloride 10 mL/hr at 10/06/20 1800  . ceFEPime (MAXIPIME) IV 2 g (10/06/20 2128)  . dexmedetomidine (PRECEDEX) IV infusion 1.2 mcg/kg/hr (10/07/20 0546)  . feeding supplement (VITAL 1.5 CAL) 1,000 mL (10/06/20 1528)  . heparin 1,050 Units/hr (10/06/20 1800)  . HYDROmorphone 2 mg/hr (10/07/20 0838)  . midazolam 10 mg/hr (10/07/20 0546)   . vitamin C  500 mg Per Tube Daily  . chlorhexidine gluconate (MEDLINE KIT)  15 mL Mouth Rinse BID  . Chlorhexidine Gluconate Cloth  6 each Topical Daily  . feeding supplement (PROSource TF)  45 mL Per Tube Daily  . folic acid  1 mg Per Tube Daily  . free water  200 mL Per Tube Q2H  . insulin aspart  0-15 Units Subcutaneous Q4H  . insulin detemir  5 Units Subcutaneous BID  . mouth rinse  15 mL Mouth Rinse 10 times per day  . metoprolol tartrate  25 mg Oral BID  . multivitamin with minerals  1 tablet Per Tube Daily  . oxyCODONE  15 mg Oral Q6H  . pantoprazole  (PROTONIX) IV  40 mg Intravenous Q24H  . predniSONE  40 mg Oral Q breakfast   Followed by  . [START ON 10/11/2020] predniSONE  30 mg Oral Q breakfast   Followed by  . [START ON 10/16/2020] predniSONE  20 mg Oral Q breakfast   Followed by  . [START ON 11/06/20] predniSONE  10 mg Oral Q breakfast   Followed by  . [START ON 10/26/2020] predniSONE  5 mg Oral Q breakfast  . QUEtiapine  50 mg Oral BID  . thiamine  100 mg Per Tube Daily  . zinc sulfate  220 mg Per Tube Daily   acetaminophen, heparin, labetalol, midazolam, morphine injection, ondansetron **OR** ondansetron (ZOFRAN) IV  Assessment/ Plan:  59 y.o. female with  hypertension, anxiety, anemia who was brought to the ER on December 16 with a chief complaint of cough shortness of breath   admitted on 09/26/2020 for Edema [R60.9] Lactic acid acidosis [E87.2] Acute respiratory failure with hypoxia ([place]) [J96.01] Pneumonia due to COVID-19 virus [U07.1, J12.82]   # Acute Kidney injury with proteinuria AKI likely secondary to ATN and iv contrast exposure (12/172021) 12/28 0701 - 12/29 0700 In: 62694 [I.V.:1580.6; WN/IO:27035.0; IV Piggyback:100.1] Out: 2890 [Urine:2880; Stool:10]   Lab Results  Component Value Date   CREATININE 2.50 (H) 10/06/2020   CREATININE 2.42 (H) 10/05/2020   CREATININE 2.82 (H) 10/04/2020    No new creatinine this AM.  We will order 1.  Good urine output of 2.8 L.  No immediate need for dialysis.  #Hypernatremia Due to brisk diuresis -Repeat serum sodium this a.m.  # Covid 19 pneumonia/acute respiratory failure Complicated by Acute resp failure with hypoxia Small pulmonary embolism right upper lobe pulmonary arteries in a distal pulmonary artery- requiring iv anticoagulation Remains on the ventilator.  Requiring 50% FiO2.  # hyperphosphatemia Lab Results  Component Value Date   PTH 109 (H) 09/29/2020   CALCIUM 8.6 (L) 10/06/2020   PHOS 4.1 10/06/2020    Repeat serum phosphorus today.   LOS:  13 Margaret Shepard 12/29/20218:57 [place], [place]  Note: This note was prepared with Dragon dictation. Any transcription errors are unintentional

## 2020-10-08 DIAGNOSIS — U071 COVID-19: Secondary | ICD-10-CM | POA: Diagnosis not present

## 2020-10-08 DIAGNOSIS — J1282 Pneumonia due to coronavirus disease 2019: Secondary | ICD-10-CM | POA: Diagnosis not present

## 2020-10-08 LAB — RENAL FUNCTION PANEL
Albumin: 2.2 g/dL — ABNORMAL LOW (ref 3.5–5.0)
Anion gap: 9 (ref 5–15)
BUN: 78 mg/dL — ABNORMAL HIGH (ref 6–20)
CO2: 29 mmol/L (ref 22–32)
Calcium: 8.6 mg/dL — ABNORMAL LOW (ref 8.9–10.3)
Chloride: 104 mmol/L (ref 98–111)
Creatinine, Ser: 2.24 mg/dL — ABNORMAL HIGH (ref 0.44–1.00)
GFR, Estimated: 25 mL/min — ABNORMAL LOW (ref >60)
Glucose, Bld: 130 mg/dL — ABNORMAL HIGH (ref 70–99)
Phosphorus: 4.3 mg/dL (ref 2.5–4.6)
Potassium: 4.3 mmol/L (ref 3.5–5.1)
Sodium: 142 mmol/L (ref 135–145)

## 2020-10-08 LAB — CBC
HCT: 26.7 % — ABNORMAL LOW (ref 36.0–46.0)
Hemoglobin: 8.8 g/dL — ABNORMAL LOW (ref 12.0–15.0)
MCH: 29.3 pg (ref 26.0–34.0)
MCHC: 33 g/dL (ref 30.0–36.0)
MCV: 89 fL (ref 80.0–100.0)
Platelets: 211 10*3/uL (ref 150–400)
RBC: 3 MIL/uL — ABNORMAL LOW (ref 3.87–5.11)
RDW: 14.6 % (ref 11.5–15.5)
WBC: 11.1 10*3/uL — ABNORMAL HIGH (ref 4.0–10.5)
nRBC: 0 % (ref 0.0–0.2)

## 2020-10-08 LAB — GLUCOSE, CAPILLARY
Glucose-Capillary: 107 mg/dL — ABNORMAL HIGH (ref 70–99)
Glucose-Capillary: 119 mg/dL — ABNORMAL HIGH (ref 70–99)
Glucose-Capillary: 153 mg/dL — ABNORMAL HIGH (ref 70–99)
Glucose-Capillary: 250 mg/dL — ABNORMAL HIGH (ref 70–99)
Glucose-Capillary: 148 mg/dL — ABNORMAL HIGH (ref 70–99)
Glucose-Capillary: 175 mg/dL — ABNORMAL HIGH (ref 70–99)

## 2020-10-08 LAB — HEPARIN LEVEL (UNFRACTIONATED)
Heparin Unfractionated: 0.3 IU/mL (ref 0.30–0.70)
Heparin Unfractionated: 0.53 IU/mL (ref 0.30–0.70)
Heparin Unfractionated: 0.56 IU/mL (ref 0.30–0.70)

## 2020-10-08 LAB — MAGNESIUM: Magnesium: 1.8 mg/dL (ref 1.7–2.4)

## 2020-10-08 MED ORDER — CLONAZEPAM 1 MG PO TABS
1.0000 mg | ORAL_TABLET | Freq: Two times a day (BID) | ORAL | Status: DC
  Administered 2020-10-08 – 2020-10-09 (×2): 1 mg via ORAL
  Filled 2020-10-08 (×2): qty 1

## 2020-10-08 MED ORDER — METOLAZONE 5 MG PO TABS
5.0000 mg | ORAL_TABLET | Freq: Once | ORAL | Status: AC
  Administered 2020-10-08: 12:00:00 5 mg via ORAL
  Filled 2020-10-08: qty 1

## 2020-10-08 MED ORDER — HEPARIN (PORCINE) 25000 UT/250ML-% IV SOLN
1150.0000 [IU]/h | INTRAVENOUS | Status: DC
  Administered 2020-10-08: 06:00:00 1150 [IU]/h via INTRAVENOUS
  Filled 2020-10-08: qty 250

## 2020-10-08 MED ORDER — FUROSEMIDE 10 MG/ML IJ SOLN
40.0000 mg | Freq: Once | INTRAMUSCULAR | Status: AC
  Administered 2020-10-08: 11:00:00 40 mg via INTRAVENOUS
  Filled 2020-10-08: qty 4

## 2020-10-08 MED ORDER — MAGNESIUM SULFATE 2 GM/50ML IV SOLN
2.0000 g | Freq: Once | INTRAVENOUS | Status: AC
  Administered 2020-10-08: 08:00:00 2 g via INTRAVENOUS
  Filled 2020-10-08: qty 50

## 2020-10-08 NOTE — Plan of Care (Signed)
Discussed with the patient plan of care for the evening, pain management, mouth care and bath with no evidence of learning at this time.  Problem: Education: Goal: Knowledge of General Education information will improve Description: Including pain rating scale, medication(s)/side effects and non-pharmacologic comfort measures Outcome: Progressing   Problem: Health Behavior/Discharge Planning: Goal: Ability to manage health-related needs will improve Outcome: Progressing

## 2020-10-08 NOTE — Consult Note (Signed)
ANTICOAGULATION CONSULT NOTE   Pharmacy Consult for Heparin Indication: DVT/PE  Patient Measurements: Heparin Dosing Weight: 63.5 kg  Labs: Recent Labs    10/06/20 0457 10/06/20 0500 10/06/20 0500 10/07/20 0332 10/07/20 1000 10/08/20 0422 10/08/20 1254 10/08/20 2231  HGB  --  8.7*   < > 8.5*  --  8.8*  --   --   HCT  --  27.8*  --  26.8*  --  26.7*  --   --   PLT  --  226  --  200  --  211  --   --   HEPARINUNFRC  --  0.38   < > 0.38  --  0.30 0.53 0.56  CREATININE 2.50*  --   --   --  2.16* 2.24*  --   --    < > = values in this interval not displayed.   Estimated Creatinine Clearance: 26 mL/min (A) (by C-G formula based on SCr of 2.24 mg/dL (H)).  Medical History: Past Medical History:  Diagnosis Date  . Anemia    noted during hip surgery   . Anxiety    states no longer issue  . Frequent headaches   . Hypertension   . Lumbago   . 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 Patient was admitted for acute hypoxic respiratory failure 2/2 PNA due to COVID-19. Pharmacy consulted for heparin dosing and monitoring for ACS and pulmonary embolism. CT chest with small pulmonary embolism right upper lobe. Trop elevated 260. CBC stable. No DOAC PTA.   Goal of Therapy:  Heparin level 0.3-0.7 units/ml Monitor platelets by anticoagulation protocol: Yes    Plan:  12/30: HL @ 2231 = 0.56  Will continue pt on current rate and recheck HL on 12/31 with AM labs.   -Margaret Shepard D 10/08/2020 11:26 PM

## 2020-10-08 NOTE — Progress Notes (Signed)
CRITICAL CARE NOTE  59 yo female admitted with acute hypoxic respiratory failure secondary to pneumonia due to COVID-19 requiring HHFNC and NRB now intubated and severe resp failure complicated by DVT and PE  12/16 intubated 12/17 high risk for cardiac arrest, on pressors 12/18 Off pressors 12/19 worsening renal function.  Nephrology consult 09/28/20- patient was noted to be severely acidemic with HAGMA in am s/p 2 amp bicarb with repeat ABG significantly improved 12/21- patient will require HD, will place vascath today. Meds reviewed and changed, TG elevated on propofol. DCd IVF. 12/22-  Patient remains critically ill. Reviewed plan with nephrologist, patient will not have HD today. 10/01/20- patient on 35%FIo2 8peep, she diruesed >3L we will hold HD, continue diuresis. SBT.  10/02/20- patient with tachyarrythmia overnight.  S/p cardiology evaluation. For SBT today.  10/03/20- patient with no overnight events, remains critically ill on MV.  Diuresed well overnight.  Discussed care plan with nephrologist today.  10/04/20-  Patient remains critically ill.  She had SBT daily and has not been able to pass yet.  10/05/20-with no acute events overnight remains on PEEP of 8 and FiO2 of 55%  10/06/20-Patient on peep of 8 and FiO2 60%. Trying to weansedation but very difficult in spite of using oral medications  10/07/20-patient tolerated 5/5 trial for over 30 minutes.  She was able to follow commands today.  She was able to wiggle her right toes.  She was able to stick out her tongue.  10/08/2020- Lightly sedated, able to follow some simple commands.  Currently on 50% FiO2 & 8 PEEP, increased work of breathing on vent.  Consult ENT for Trach.   CC  follow up respiratory failure  INTERVAL UPDATES   Vent Mode: PRVC FiO2 (%):  [50 %] 50 % Set Rate:  [32 bmp] 32 bmp Vt Set:  [500 mL] 500 mL PEEP:  [5 cmH20-8 cmH20] 8 cmH20 Pressure Support:  [5 cmH20] 5 cmH20 Plateau Pressure:  [22 cmH20-31  cmH20] 31 cmH20  CBC    Component Value Date/Time   WBC 11.1 (H) 10/08/2020 0422   RBC 3.00 (L) 10/08/2020 0422   HGB 8.8 (L) 10/08/2020 0422   HGB 13.6 07/08/2012 2019   HCT 26.7 (L) 10/08/2020 0422   HCT 39.4 07/08/2012 2019   PLT 211 10/08/2020 0422   PLT 202 07/08/2012 2019   MCV 89.0 10/08/2020 0422   MCV 91 07/08/2012 2019   MCH 29.3 10/08/2020 0422   MCHC 33.0 10/08/2020 0422   RDW 14.6 10/08/2020 0422   RDW 12.6 07/08/2012 2019   LYMPHSABS 1.3 09/26/2020 0426   MONOABS 0.7 09/26/2020 0426   EOSABS 0.0 09/26/2020 0426   BASOSABS 0.0 09/26/2020 0426   BMP Latest Ref Rng & Units 10/08/2020 10/07/2020 10/06/2020  Glucose 70 - 99 mg/dL 517(G) 017(C) 944(H)  BUN 6 - 20 mg/dL 67(R) 91(M) 38(G)  Creatinine 0.44 - 1.00 mg/dL 6.65(L) 9.35(T) 0.17(B)  Sodium 135 - 145 mmol/L 142 142 146(H)  Potassium 3.5 - 5.1 mmol/L 4.3 4.2 4.2  Chloride 98 - 111 mmol/L 104 105 108  CO2 22 - 32 mmol/L 29 30 29   Calcium 8.9 - 10.3 mg/dL ) 8.3(L) 8.6(L)     BP 123/81 (BP Location: Right Arm)   Pulse 86   Temp 97.88 F (36.6 C) (Esophageal)   Resp 19   Ht 5\' 4"  (1.626 m)   Wt 72.1 kg   SpO2 93%   BMI 27.28 kg/m    I/O last 3 completed shifts:  In: 4227.9 [I.V.:1607.9; NG/GT:2520; IV Piggyback:100.1] Out: 5935 [Urine:5925; Stool:10] Total I/O In: 227 [I.V.:127; IV Piggyback:100] Out: 250 [Urine:250]  SpO2: 93 % O2 Flow Rate (L/min): 15 L/min FiO2 (%): 50 %  Estimated body mass index is 27.28 kg/m as calculated from the following:   Height as of this encounter: 5\' 4"  (1.626 m).   Weight as of this encounter: 72.1 kg.  SIGNIFICANT EVENTS   REVIEW OF SYSTEMS  PATIENT IS UNABLE TO PROVIDE COMPLETE REVIEW OF SYSTEMS DUE TO SEVERE CRITICAL ILLNESS    PHYSICAL EXAMINATION: Gen:      Critically ill appearing female, laying in bed, intubated and sedated, with increased work of breathing HEENT:  Atraumatic, normocephalic, Pupils PERRL Neck:     Supple, no JVD, No  thyromegaly Lungs:   Coarse rhonchi bilaterally, vent assisted  CV:         Tachycardia, regular rhythm, s1s2, no M/R/G, 2+ distal pulses Abd:      Soft, nontender, nondistended, no guarding or rebound tenderness, BS+ x4 Ext:    No deformities, no edema, no clubbing Skin:      Diaphoretic, warm.  No obvious rashes, lesions, or ulcerations Neuro:  Sedated, with opens eyes to voice and follow some simple commands    MEDICATIONS: I have reviewed all medications and confirmed regimen as documented   CULTURE RESULTS   No results found for this or any previous visit (from the past 240 hour(s)).        IMAGING    No results found.   Nutrition Status: Nutrition Problem: Inadequate oral intake Etiology: inability to eat Signs/Symptoms: NPO status Interventions: Tube feeding,Prostat,MVI     Indwelling Urinary Catheter continued, requirement due to   Reason to continue Indwelling Urinary Catheter strict Intake/Output monitoring for hemodynamic instability   Central Line/ continued, requirement due to  Reason to continue Monitoring of central venous pressure or other hemodynamic parameters and poor IV access   Ventilator continued, requirement due to severe respiratory failure   Ventilator Sedation RASS 0 to -2      ASSESSMENT AND PLAN SYNOPSIS 59 year old with ARDS, COVID-19 pneumonia, bilateral DVT and small right upper lobe PE   Acute respiratory failure with hypoxemia in the setting of COVID-19 Pneumonia with ARDS, and small right upper lobe PE -Mechanical ventilation via ARDS protocol, target PRVC 6 cc/kg -Wean PEEP and FiO2 as able to maintain O2 sats >88% -Goal plateau pressure less than 30, driving pressure less than 15 -Paralytics if necessary for vent synchrony, gas exchange -Cycle prone positioning if necessary for oxygenation -Deep sedation per PAD protocol, goal RASS -4, currently fentanyl, midazolam -Diuresis as blood pressure and renal  function can tolerate, goal CVP 5-8.   -VAP prevention order set -Completed course of Remdesivir and Barcinitib -Steroids>>On 40 of prednisone have put in taper to wean -Follow inflammatory markers: Ferritin, D-dimer, CRP, IL-6, LDH -Vitamin C, zinc -We will continue p.o. Roxicodone and try to increase  as patient does not tolerate weaning sedation for SBT -Discussed with pt's family, they are in agreement with proceeding with Trach.    Acute toxic metabolic encephalopathy, need for sedation -Increase RASS goal to -2-3 as she is dyssynchronous with the vent -Intermittent paralytics -Continue Dilaudid, Versed, Precedex -Daily Wake up assessment -Provide supportive care   SHOCK-SEPSIS Off pressors   CARDIAC ICU monitoring   GI GI PROPHYLAXIS as indicated   DIET- TF's as tolerated, Dietician following Constipation protocol as indicated    ENDO - will use ICU  hypoglycemic\Hyperglycemia protocol if indicated Continue Levemir, SSI    DVT/PE Continue heparin drip   AKI Monitor I&O's / urinary output Follow BMP Ensure adequate renal perfusion Avoid nephrotoxic agents as able Replace electrolytes as indicated Nephrology following, appreciate input   GOALS OF CARE -Limited code (NO CPR, DEFIB, ACLS MEDS) -Updated pt's aunt Tomasa Blase on 10/08/20.  Discussed inability to wean to this point, and need to pursue Janina Mayo if they desire aggressive measures.  They wish to proceed with Trach.     CASE DISCUSSED IN MULTIDISCIPLINARY ROUNDS WITH ICU TEAM  The patient is critically ill with multiple organ system failure and requires high complexity decision making for assessment and support, frequent evaluation and titration of therapies, advanced monitoring, review of radiographic studies and interpretation of complex data.   Critical Care Time devoted to patient care services, exclusive of separately billable procedures, described in this note is 35 minutes.     Harlon Ditty, AGACNP-BC [facility] Pager: [phone]

## 2020-10-08 NOTE — Progress Notes (Signed)
[place], [place] 10/08/20  Subjective:   Hospital day # 14  Patient continues to have excellent urine output at 4.5 L over the preceding 24 hours. Creatinine currently 2.2.   Renal: 12/29 0701 - 12/30 0700 In: 1646.1 [I.V.:986.1; NG/GT:660] Out: 4500 [Urine:4500] Lab Results  Component Value Date   CREATININE 2.24 (H) 10/08/2020   CREATININE 2.16 (H) 10/07/2020   CREATININE 2.50 (H) 10/06/2020     Objective:  Vital signs in last 24 hours:  Temp:  [95.9 F (35.5 C)-99.32 F (37.4 C)] 97.88 F (36.6 C) (12/30 1400) Pulse Rate:  [71-97] 78 (12/30 1400) Resp:  [14-28] 17 (12/30 1400) BP: (117-156)/(71-84) 117/72 (12/30 1400) SpO2:  [87 %-95 %] 95 % (12/30 1400) FiO2 (%):  [50 %] 50 % (12/30 0804) Weight:  [72.1 kg] 72.1 kg (12/30 0418)  Weight change: -2.9 kg Filed Weights   10/06/20 0500 10/07/20 0500 10/08/20 0418  Weight: 72.1 kg 75 kg 72.1 kg    Intake/Output:    Intake/Output Summary (Last 24 hours) at 10/08/2020 1520 Last data filed at 10/08/2020 0800 Gross per 24 hour  Intake 1659.53 ml  Output 2900 ml  Net -1240.47 ml    Physical Exam: General:  Critically ill-appearing  HEENT  ETT in place, OGT  Pulm/lungs  vent assisted FiO2 50%, respiratory rate 20  CVS/Heart  regular rhythm  Abdomen:   Nondistended  Extremities:  + peripheral edema  Neurologic:  sedated  Skin:  No acute rashes  Foley in place    Basic Metabolic Panel:  Recent Labs  Lab 10/02/20 0812 10/03/20 0502 10/04/20 0315 10/05/20 0451 10/06/20 0457 10/06/20 0500 10/07/20 1000 10/08/20 0422  NA  --  150* 151* 149* 146*  --  142 142  K  --  4.2 4.2 4.6 4.2  --  4.2 4.3  CL  --  112* 112* 112* 108  --  105 104  CO2  --  '25 29 27 29  ' --  30 29  GLUCOSE  --  192* 135* 118* 130*  --  121* 130*  BUN  --  113* 104* 97* 88*  --  77* 78*  CREATININE  --  3.19* 2.82* 2.42* 2.50*  --  2.16* 2.24*  CALCIUM  --  8.5* 8.7* 8.9 8.6*  --  8.3* 8.6*  MG  2.4  --  2.2 2.0  --  1.9  --  1.8  PHOS  --  5.4* 5.1*  --  4.1  --  4.0 4.3     CBC: Recent Labs  Lab 10/04/20 0315 10/05/20 0451 10/06/20 0500 10/07/20 0332 10/08/20 0422  WBC 23.0* 13.0* 11.8* 11.0* 11.1*  HGB 10.3* 9.4* 8.7* 8.5* 8.8*  HCT 32.5* 30.2* 27.8* 26.8* 26.7*  MCV 89.5 91.5 91.4 89.9 89.0  PLT 370 288 226 200 211      Lab Results  Component Value Date   HEPBSAG NON REACTIVE 09/27/2020      Microbiology:  No results found for this or any previous visit (from the past 240 hour(s)).  Coagulation Studies: No results for input(s): LABPROT, INR in the last 72 hours.  Urinalysis: No results for input(s): COLORURINE, LABSPEC, PHURINE, GLUCOSEU, HGBUR, BILIRUBINUR, KETONESUR, PROTEINUR, UROBILINOGEN, NITRITE, LEUKOCYTESUR in the last 72 hours.  Invalid input(s): APPERANCEUR    Imaging: No results found.   Medications:   . sodium chloride 10 mL/hr at 10/06/20 1800  . ceFEPime (MAXIPIME) IV 2 g (10/07/20 2216)  . dexmedetomidine (PRECEDEX) IV infusion  1 mcg/kg/hr (10/08/20 0535)  . feeding supplement (VITAL 1.5 CAL) 1,000 mL (10/06/20 1528)  . heparin 1,150 Units/hr (10/08/20 0625)  . HYDROmorphone 3 mg/hr (10/08/20 0929)  . midazolam 2 mg/hr (10/08/20 0418)   . vitamin C  500 mg Per Tube Daily  . chlorhexidine gluconate (MEDLINE KIT)  15 mL Mouth Rinse BID  . Chlorhexidine Gluconate Cloth  6 each Topical Daily  . feeding supplement (PROSource TF)  45 mL Per Tube Daily  . folic acid  1 mg Per Tube Daily  . free water  200 mL Per Tube Q6H  . insulin aspart  0-15 Units Subcutaneous Q4H  . insulin detemir  5 Units Subcutaneous BID  . mouth rinse  15 mL Mouth Rinse 10 times per day  . metoprolol tartrate  25 mg Oral BID  . multivitamin with minerals  1 tablet Per Tube Daily  . oxyCODONE  15 mg Oral Q6H  . pantoprazole (PROTONIX) IV  40 mg Intravenous Q24H  . predniSONE  40 mg Oral Q breakfast   Followed by  . [START ON 10/11/2020] predniSONE  30 mg  Oral Q breakfast   Followed by  . [START ON 10/16/2020] predniSONE  20 mg Oral Q breakfast   Followed by  . [START ON November 11, 2020] predniSONE  10 mg Oral Q breakfast   Followed by  . [START ON 10/26/2020] predniSONE  5 mg Oral Q breakfast  . QUEtiapine  50 mg Oral BID  . thiamine  100 mg Per Tube Daily  . zinc sulfate  220 mg Per Tube Daily   acetaminophen, labetalol, midazolam, morphine injection, ondansetron **OR** ondansetron (ZOFRAN) IV  Assessment/ Plan:  59 y.o. female with  hypertension, anxiety, anemia who was brought to the ER on December 16 with a chief complaint of cough shortness of breath   admitted on 09/12/2020 for Edema [R60.9] Lactic acid acidosis [E87.2] Acute respiratory failure with hypoxia ([place]) [J96.01] Pneumonia due to COVID-19 virus [U07.1, J12.82]   # Acute Kidney injury with proteinuria AKI likely secondary to ATN and iv contrast exposure (12/172021) 12/29 0701 - 12/30 0700 In: 1646.1 [I.V.:986.1; NG/GT:660] Out: 4500 [Urine:4500]   Lab Results  Component Value Date   CREATININE 2.24 (H) 10/08/2020   CREATININE 2.16 (H) 10/07/2020   CREATININE 2.50 (H) 10/06/2020    Patient continues to have excellent urine output at 4.5 L over the preceding 24 hours.  Received furosemide 40 mg IV x1 today.  Continue to monitor BUN closely as this has been trending up.  No indication for dialysis.  #Hypernatremia Due to brisk diuresis -Now resolved.  Serum sodium 142..  # Covid 19 pneumonia/acute respiratory failure Complicated by Acute resp failure with hypoxia Small pulmonary embolism right upper lobe pulmonary arteries in a distal pulmonary artery- requiring iv anticoagulation -Patient continues to require ventilatory support.  Weaning as per pulmonary/critical care.  # hyperphosphatemia Lab Results  Component Value Date   PTH 109 (H) 09/29/2020   CALCIUM 8.6 (L) 10/08/2020   PHOS 4.3 10/08/2020    Phosphorus acceptable at 4.3.  Continue to monitor bone  mineral metabolism parameters periodically.   LOS: 14 Rosell Khouri 12/30/20213:20 PM  [place], [place]  Note: This note was prepared with Dragon dictation. Any transcription errors are unintentional

## 2020-10-08 NOTE — Progress Notes (Signed)
Nutrition Follow Up Note   DOCUMENTATION CODES:   Not applicable  INTERVENTION:   Continue Vital 1.5 @ 13m/hr + Pro-Source 439mdaily via tube  Free water flushes 305m4 hours to maintain tube patency   Regimen provides 2060kcal/day, 100g/day protein and 1188m85my free water   Provide MVI tablet once daily per tube   NUTRITION DIAGNOSIS:   Inadequate oral intake related to inability to eat as evidenced by NPO status.  GOAL:   Patient will meet greater than or equal to 90% of their needs  -met with tube feeds   MONITOR:   Vent status,Labs,Weight trends,TF tolerance,I & O's  ASSESSMENT:   59 y24r old female with PMHx of HTN, seizures, anemia, anxiety admitted with [id number] complicated by DVT and probable acute PE.   Pt remains sedated and ventilated. OGT in place. Pt tolerating tube feeds at goal rate. Pt undergoing SBTs. Family deciding about tracheostomy.   Per chart, pt up ~ 19lbs since admit; pt is currently diuresing. Nephrology following; no plans for HD at this time.   Medications reviewed and include: vitamin C, folic acid, lasix, insulin, MVI, oxycodone, protonix, prednisone, thiamine, zinc, cefepime, heparin, precedex, hydromorphone  Labs reviewed: K 4.3 wnl, BUN 78(H), creat 2.24(H), P 4.3 wnl, Mg 2.2(L) Wbc- 11.1(H), Hgb 8.8(L), Hct 26.7(L) cbgs- 107, 119, 153 x 24 hrs   Patient is currently intubated on ventilator support MV: 12.8 L/min Temp (24hrs), Avg:98.5 F (36.9 C), Min:95.9 F (35.5 C), Max:99.86 F (37.7 C)  Propofol: none   MAP- >65mm30mUOP- 4500ml 18mt Order:   Diet Order            Diet NPO time specified  Diet effective now                EDUCATION NEEDS:   No education needs have been identified at this time  Skin:  Skin Assessment: Reviewed RN Assessment  Last BM:  12/28- TYPE 7  Height:   Ht Readings from Last 1 Encounters:  10/09/2020 '5\' 4"'  (1.626 m)   Weight:   Wt Readings from Last 1 Encounters:   10/08/20 72.1 kg   Ideal Body Weight:  54.5 kg  BMI:  Body mass index is 27.28 kg/m.  Estimated Nutritional Needs:   Kcal:  2037kcal  Protein:  97-110 grams  Fluid:  1.9-2.2 L/day  Michella Detjen Koleen DistanceD, LDN Please refer to [facility] and/or RD on-call/weekend/after hours pager

## 2020-10-08 NOTE — Consult Note (Signed)
ANTICOAGULATION CONSULT NOTE   Pharmacy Consult for Heparin Indication: DVT/PE  Patient Measurements: Heparin Dosing Weight: 63.5 kg  Labs: Recent Labs    10/06/20 0457 10/06/20 0500 10/06/20 0500 10/07/20 0332 10/07/20 1000 10/08/20 0422 10/08/20 1254  HGB  --  8.7*   < > 8.5*  --  8.8*  --   HCT  --  27.8*  --  26.8*  --  26.7*  --   PLT  --  226  --  200  --  211  --   HEPARINUNFRC  --  0.38   < > 0.38  --  0.30 0.53  CREATININE 2.50*  --   --   --  2.16* 2.24*  --    < > = values in this interval not displayed.   Estimated Creatinine Clearance: 26 mL/min (A) (by C-G formula based on SCr of 2.24 mg/dL (H)).  Medical History: Past Medical History:  Diagnosis Date  . Anemia    noted during hip surgery   . Anxiety    states no longer issue  . Frequent headaches   . Hypertension   . Lumbago   . 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 Patient was admitted for acute hypoxic respiratory failure 2/2 PNA due to COVID-19. Pharmacy consulted for heparin dosing and monitoring for ACS and pulmonary embolism. CT chest with small pulmonary embolism right upper lobe. Trop elevated 260. CBC stable. No DOAC PTA.   Goal of Therapy:  Heparin level 0.3-0.7 units/ml Monitor platelets by anticoagulation protocol: Yes    Plan:  --12/30 at 1254 HL = 0.53, therapeutic x 1 on new rate of 1150 units/hr. Continue drip at current rate. --Re-check confirmatory HL at 2100 --Daily CBC per protocol while on heparin infusion  Tressie Ellis 10/08/2020 1:39 PM

## 2020-10-08 NOTE — Consult Note (Addendum)
ANTICOAGULATION CONSULT NOTE   Pharmacy Consult for Heparin Indication: DVT/PE  Patient Measurements: Heparin Dosing Weight: 63.5 kg  Labs: Recent Labs    10/06/20 0457 10/06/20 0500 10/06/20 0500 10/07/20 0332 10/07/20 1000 10/08/20 0422  HGB  --  8.7*   < > 8.5*  --  8.8*  HCT  --  27.8*  --  26.8*  --  26.7*  PLT  --  226  --  200  --  211  HEPARINUNFRC  --  0.38  --  0.38  --  0.30  CREATININE 2.50*  --   --   --  2.16* 2.24*   < > = values in this interval not displayed.   Estimated Creatinine Clearance: 26 mL/min (A) (by C-G formula based on SCr of 2.24 mg/dL (H)).  Medical History: Past Medical History:  Diagnosis Date  . Anemia    noted during hip surgery   . Anxiety    states no longer issue  . Frequent headaches   . Hypertension   . Lumbago   . 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 Patient was admitted for acute hypoxic respiratory failure 2/2 PNA due to COVID-19. Pharmacy consulted for heparin dosing and monitoring for ACS and pulmonary embolism. CT chest with small pulmonary embolism right upper lobe. Trop elevated 260. CBC stable. No DOAC PTA.   Goal of Therapy:  Heparin level 0.3-0.7 units/ml Monitor platelets by anticoagulation protocol: Yes   12/17 0621 HL = 0.2, bolus 1000 units, increased rate to 900 units/hr  12/17 1802 HL = 0.26, bolus 900 units, increased rate to 1000 units/hr 12/18 0057 HL = 0.29, bolus 900 units, increased rate to 1100 units/hr 12/18 0814 HL = 0.42 x1, rate continued at 1100 units/hr 12/18 1500 HL = 0.40 x2 12/19 0511 HL = 0.58, therapeutic X 3  12/20 0322 HL = 1.84 , supratherapeutic -1100 12/20 0610 HL = 2.0     supratherapeutic-850 12/21 0621 HL = 0.36 therapeutic x 1 12/21 2336 HL = 0.23, subtherapeutic 12/22 0801 HL = 0.25, subtherapeutic 12/22 1643 HL = 0.37, therapeutic x  1 12/22 2339 HL = 0.31, therapeutic x 2 12/23 0626 HL = 0.10, subtherapeutic 12/23 1706 HL = 0.59, therapeutic x1 12/24 0049 HL = 0.42, therapeutic x 2 12/25 0502 HL = 0.66, therapeutic x 3, H/H slightly worse, PLTs ok 12/26 0315 HL = 0.59, therapeutic x 4.  CBC stable. 12/27 9451 HL = 0.49, therapeutic x 5.  CBC stable.  12/28 0500 HL = 0.38, therapeutic x 6.  H/H slightly worse, PLT dropped by ~22% 12/29 0332 HL = 0.38, therapeutic x 7 12/30 0422 HL = 0.30, therapeutic x 8   Plan:  HL therapeutic, but given current HL at bottom end of goal range and trending downward for past 5 days, will increase heparin drip rate to 1150 units/hr Re-check HL in 6 hours, then daily w/ CBC Daily CBC per protocol while on heparin infusion,  Otelia Sergeant, PharmD, [facility] 10/08/2020 5:48 AM

## 2020-10-08 NOTE — Progress Notes (Deleted)
10/08/2020 Seen in f/u for COVID ARDS on vent.  S:  In more distress today than yesterday, able to nod head "no" to pain.  O: Blood pressure 117/72, pulse 78, temperature 97.88 F (36.6 C), resp. rate 17, height 5\' 4"  (1.626 m), weight 72.1 kg, SpO2 95 %.  Ill appearing woman intubated, sedated Heart sounds regular, ext warm Scattered rhonci, + accessory muscle use triggering vent Trace anasarca Moves all 4 ext to command but profoundly weak  Afebrile Remains on cefepime 4500 cc uop yesterday with diuresis K okay Mag slightly low  A:  - Hypoxemic respiratory failure secondary to COVID ARDS, day 13 on vent, unable to wean due to agitation and WOB - Question of HCAP - Small PE on AC - Metabolic encephalopathy from sedation and prolonged critical illness - Hospital acquired deconditioning - Acute kidney injury and volume overloaded state of heart improved  P:  - Lasix, metolazone x 1, goal neg 1L - Continue heparin gtt - Start klonipin, wean versed gtt - Continue oxycodone, wean dilaudid drip as able - I think the only way to wean from vent here is going to be tracheostomy, we will reach out to family for consent - Cefepime to complete 7 days therapy  The patient is critically ill with multiple organ systems failure and requires high complexity decision making for assessment and support, frequent evaluation and titration of therapies, application of advanced monitoring technologies and extensive interpretation of multiple databases. Critical Care Time devoted to patient care services described in this note independent of APP/resident  time is 39 minutes.   10/08/2020 10/10/2020 MD

## 2020-10-09 DIAGNOSIS — J1282 Pneumonia due to coronavirus disease 2019: Secondary | ICD-10-CM | POA: Diagnosis not present

## 2020-10-09 DIAGNOSIS — J9601 Acute respiratory failure with hypoxia: Secondary | ICD-10-CM | POA: Diagnosis not present

## 2020-10-09 DIAGNOSIS — U071 COVID-19: Secondary | ICD-10-CM | POA: Diagnosis not present

## 2020-10-09 LAB — RENAL FUNCTION PANEL
Albumin: 2.3 g/dL — ABNORMAL LOW (ref 3.5–5.0)
Anion gap: 10 (ref 5–15)
BUN: 75 mg/dL — ABNORMAL HIGH (ref 6–20)
CO2: 32 mmol/L (ref 22–32)
Calcium: 8.8 mg/dL — ABNORMAL LOW (ref 8.9–10.3)
Chloride: 104 mmol/L (ref 98–111)
Creatinine, Ser: 2.13 mg/dL — ABNORMAL HIGH (ref 0.44–1.00)
GFR, Estimated: 26 mL/min — ABNORMAL LOW (ref >60)
Glucose, Bld: 142 mg/dL — ABNORMAL HIGH (ref 70–99)
Phosphorus: 4.4 mg/dL (ref 2.5–4.6)
Potassium: 4.5 mmol/L (ref 3.5–5.1)
Sodium: 146 mmol/L — ABNORMAL HIGH (ref 135–145)

## 2020-10-09 LAB — GLUCOSE, CAPILLARY
Glucose-Capillary: 100 mg/dL — ABNORMAL HIGH (ref 70–99)
Glucose-Capillary: 108 mg/dL — ABNORMAL HIGH (ref 70–99)
Glucose-Capillary: 125 mg/dL — ABNORMAL HIGH (ref 70–99)
Glucose-Capillary: 211 mg/dL — ABNORMAL HIGH (ref 70–99)
Glucose-Capillary: 227 mg/dL — ABNORMAL HIGH (ref 70–99)
Glucose-Capillary: 234 mg/dL — ABNORMAL HIGH (ref 70–99)

## 2020-10-09 LAB — BLOOD GAS, ARTERIAL
Acid-Base Excess: 9.3 mmol/L — ABNORMAL HIGH (ref 0.0–2.0)
Bicarbonate: 35.5 mmol/L — ABNORMAL HIGH (ref 20.0–28.0)
FIO2: 0.5
MECHVT: 400 mL
O2 Saturation: 91.9 %
PEEP: 8 cmH2O
Patient temperature: 37
RATE: 32 resp/min
pCO2 arterial: 56 mmHg — ABNORMAL HIGH (ref 32.0–48.0)
pH, Arterial: 7.41 (ref 7.350–7.450)
pO2, Arterial: 63 mmHg — ABNORMAL LOW (ref 83.0–108.0)

## 2020-10-09 LAB — CBC
HCT: 26.5 % — ABNORMAL LOW (ref 36.0–46.0)
Hemoglobin: 8.9 g/dL — ABNORMAL LOW (ref 12.0–15.0)
MCH: 29.8 pg (ref 26.0–34.0)
MCHC: 33.6 g/dL (ref 30.0–36.0)
MCV: 88.6 fL (ref 80.0–100.0)
Platelets: 181 10*3/uL (ref 150–400)
RBC: 2.99 MIL/uL — ABNORMAL LOW (ref 3.87–5.11)
RDW: 14.6 % (ref 11.5–15.5)
WBC: 11.9 10*3/uL — ABNORMAL HIGH (ref 4.0–10.5)
nRBC: 0 % (ref 0.0–0.2)

## 2020-10-09 LAB — MAGNESIUM: Magnesium: 2.2 mg/dL (ref 1.7–2.4)

## 2020-10-09 LAB — HEPARIN LEVEL (UNFRACTIONATED): Heparin Unfractionated: 0.57 IU/mL (ref 0.30–0.70)

## 2020-10-09 MED ORDER — MIDAZOLAM HCL 2 MG/2ML IJ SOLN: 2.0000 mg | Freq: Once | INTRAMUSCULAR | Status: AC

## 2020-10-09 MED ORDER — FUROSEMIDE 10 MG/ML IJ SOLN
40.0000 mg | Freq: Once | INTRAMUSCULAR | Status: AC
  Administered 2020-10-09: 40 mg via INTRAVENOUS
  Filled 2020-10-09: qty 4

## 2020-10-09 MED ORDER — VECURONIUM BROMIDE 10 MG IV SOLR: 10.0000 mg | INTRAVENOUS | Status: AC

## 2020-10-09 MED ORDER — ENOXAPARIN SODIUM 80 MG/0.8ML ~~LOC~~ SOLN
1.0000 mg/kg | SUBCUTANEOUS | Status: DC
  Administered 2020-10-09: 67.5 mg via SUBCUTANEOUS
  Filled 2020-10-09: qty 0.8

## 2020-10-09 MED ORDER — METOLAZONE 5 MG PO TABS
5.0000 mg | ORAL_TABLET | Freq: Once | ORAL | Status: AC
  Administered 2020-10-09: 5 mg via ORAL
  Filled 2020-10-09: qty 1

## 2020-10-09 MED ORDER — CLONAZEPAM 1 MG PO TABS
2.0000 mg | ORAL_TABLET | Freq: Two times a day (BID) | ORAL | Status: DC
  Administered 2020-10-09 – 2020-10-16 (×14): 2 mg via ORAL
  Filled 2020-10-09 (×15): qty 2

## 2020-10-09 MED ORDER — VECURONIUM BROMIDE 10 MG IV SOLR
10.0000 mg | INTRAVENOUS | Status: DC | PRN
  Administered 2020-10-09 – 2020-10-21 (×20): 10 mg via INTRAVENOUS
  Filled 2020-10-09 (×22): qty 10

## 2020-10-09 MED ORDER — VECURONIUM BROMIDE 10 MG IV SOLR
INTRAVENOUS | Status: AC
  Administered 2020-10-09: 10 mg via INTRAVENOUS
  Filled 2020-10-09: qty 10

## 2020-10-09 MED ORDER — CHLORHEXIDINE GLUCONATE CLOTH 2 % EX PADS
6.0000 | MEDICATED_PAD | Freq: Every day | CUTANEOUS | Status: DC
  Administered 2020-10-10 – 2020-10-21 (×11): 6 via TOPICAL

## 2020-10-09 NOTE — Consult Note (Signed)
ANTICOAGULATION CONSULT NOTE   Pharmacy Consult for Heparin Indication: DVT/PE  Patient Measurements: Heparin Dosing Weight: 63.5 kg  Labs: Recent Labs    10/07/20 0332 10/07/20 1000 10/08/20 0422 10/08/20 1254 10/08/20 2231 10/09/20 0430  HGB 8.5*  --  8.8*  --   --  8.9*  HCT 26.8*  --  26.7*  --   --  26.5*  PLT 200  --  211  --   --  181  HEPARINUNFRC 0.38  --  0.30 0.53 0.56 0.57  CREATININE  --  2.16* 2.24*  --   --  2.13*   Estimated Creatinine Clearance: 26.4 mL/min (A) (by C-G formula based on SCr of 2.13 mg/dL (H)).  Medical History: Past Medical History:  Diagnosis Date  . Anemia    noted during hip surgery   . Anxiety    states no longer issue  . Frequent headaches   . Hypertension   . Lumbago   . 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 Patient was admitted for acute hypoxic respiratory failure 2/2 PNA due to COVID-19. Pharmacy consulted for heparin dosing and monitoring for ACS and pulmonary embolism. CT chest with small pulmonary embolism right upper lobe. Trop elevated 260. CBC stable. No DOAC PTA.   Goal of Therapy:  Heparin level 0.3-0.7 units/ml Monitor platelets by anticoagulation protocol: Yes    Plan:  12/30: HL @ 2231 = 0.56  12./31: HL @ 0430 = 0.57, therapeutic Will continue pt on current rate and recheck HL on 01/01 with AM labs.   Otelia Sergeant, PharmD, [facility] 10/09/2020 6:46 AM

## 2020-10-09 NOTE — Progress Notes (Signed)
NAME:  Margaret Shepard, MRN:  [id number], DOB:  August 14, 1961, LOS: 15 ADMISSION DATE:  09/25/2020, CONSULTATION DATE: 09/18/2020 REFERRING MD: Dr. Katrinka Blazing, CHIEF COMPLAINT: Shortness of Breath   Brief History:  59 yo female admitted with acute hypoxic respiratory failure secondary to pneumonia due to COVID-19 requiring HHFNC and NRB now intubated and severe resp failure complicated by DVT and PE  History of Present Illness:  This is a 59 yo unvaccinated female who presented to [facility] ER on 12/16 via EMS with c/o worsening shortness of breath, cough, nausea, diarrhea, and poor po intake.  Per ER notes pt tested positive for COVID-19 8 days prior to ER presentation.  EMS reported pt severely hypoxic with O2 sats less than 50% on RA.  Therefore she was placed on 15L NRB with O2 sats increasing to 81%.  Upon arrival to the ER pt transitioned to [facility] and NRB due to continued hypoxia and tachypnea.  Lab results revealed CO2 19, glucose 174, anon gap 18, LDH 646, troponin 260, ferritin 478, pct <0.10, lactic acid 4.9, BNP 294, and vbg pH 7.41/pCO2 37/bicarb 23.5.  COVID-19 positive and CXR concerning for multifocal pneumonia.  There was also concern of possible pulmonary embolism, however due to severe hypoxia pt unable to tolerate transfer for CTA Chest.  Therefore, heparin gtt initiated.  Pt also received aspirin, decadron, ibuprofen, zofran, and remdesivir.  She was subsequently admitted to ICU per PCCM team for additional workup and treatment, but remained in the ER pending ICU bed availability.   Past Medical History:  Seizures Lumbago HTN Headaches Anxiety Anemia   Significant Hospital Events:  12/16: Pt admitted to ICU with COVID-19 pneumonia requiring HHFNC and NRB remained in the ER pending ICU bed availability  12/17 high risk for cardiac arrest, on pressors 12/18 Off pressors 12/19 worsening renal function.  Nephrology consult 09/28/20- patient was noted to be severely acidemic with HAGMA  in am s/p 2 amp bicarb with repeat ABG significantly improved 12/21- patient will require HD, will place vascath today. Meds reviewed and changed, TG elevated on propofol. DCd IVF. 12/22-  Patient remains critically ill. Reviewed plan with nephrologist, patient will not have HD today. 10/01/20- patient on 35%FIo2 8peep, she diruesed >3L we will hold HD, continue diuresis. SBT.  10/02/20- patient with tachyarrythmia overnight.  S/p cardiology evaluation. For SBT today.  10/03/20- patient with no overnight events, remains critically ill on MV.  Diuresed well overnight.  Discussed care plan with nephrologist today.  10/04/20-  Patient remains critically ill.  She had SBT daily and has not been able to pass yet.  10/05/20-with no acute events overnight remains on PEEP of 8 and FiO2 of 55% 10/06/20-Patient on peep of 8 and FiO2 60%. Trying to weansedation but very difficult in spite of using oral medications 10/07/20-patient tolerated 5/5 trial for over 30 minutes.  She was able to follow commands today.  She was able to wiggle her right toes.  She was able to stick out her tongue. 10/08/2020- Lightly sedated, able to follow some simple commands.  Currently on 50% FiO2 & 8 PEEP, increased work of breathing on vent.  Consult ENT for Trach. 10/09/2020- Pt very asynchronous with vent, increasing sedation with prn Paralytics.  Begin Recruitment maneuvers and Diurese today;  ENT evaluated pt, tentative plan for trach some time next week  Consults:  Intensivist  Nephrology ENT  Procedures:  12/17: Endotracheal intubation 12/21: Left IJ Trialysis catheter inserted  Significant Diagnostic Tests:  12/16: CXR revealed  multifocal pneumonia  12/17: Venous US Bilateral LE>>Positive for BILATERAL lower extremity DVT, more extensive on the Left. 12/17: CTA Chest>>Small pulmonary embolism right upper lobe.Diffuse bilateral airspace disease compatible with COVID pneumonia. Bibasilar atelectasis. 12/19: Renal  US>>1. Increased echogenicity of bilateral kidneys as can be seen in medical renal disease. No hydronephrosis.  Micro Data:  COVID-19 12/16>>positive  Influenza PCR 12/16>>negative  Urine 12/16>> no growth Blood x2 12/16>>STAPHYLOCOCCUS HOMINIS  Blood 12/15>> no growth MRSA PCR 12/17>>negative  Antimicrobials:  Remdesivir 12/16>>12/20 Azithromycin 12/16>>12/17 Rocephin 12/16>>12/17 Vancomycin 12/18>>12/19 Cefepime 12/26 (plan for 5 days)>>  Interim History / Subjective:  No events reported overnight Afebrile Very asynchronous with vent Sedated with Dilaudid, versed, precedex No vasopressors, actually hypertensive with agitation  Objective   Blood pressure 124/72, pulse 87, temperature 98.6 F (37 C), resp. rate 16, height 5\' 4"  (1.626 m), weight 66.9 kg, SpO2 92 %.    Vent Mode: PRVC FiO2 (%):  [45 %-50 %] 50 % Set Rate:  [32 bmp] 32 bmp Vt Set:  [400 mL-500 mL] 400 mL PEEP:  [5 cmH20-8 cmH20] 8 cmH20 Plateau Pressure:  [29 cmH20] 29 cmH20   Intake/Output Summary (Last 24 hours) at 10/09/2020 1209 Last data filed at 10/09/2020 1131 Gross per 24 hour  Intake 4978.79 ml  Output 3325 ml  Net 1653.79 ml   Filed Weights   10/07/20 0500 10/08/20 0418 10/09/20 0419  Weight: 75 kg 72.1 kg 66.9 kg    Examination: General: Critically ill-appearing female, laying in bed, intubated and sedated, agitated and asynchronous with t HENT: Atraumatic, normocephalic, neck supple, no JVD Lungs: Coarse rhonchi throughout, no wheezing, asynchronous with ventilator accessory muscle use Cardiovascular: Tachycardia, regular rhythm, S1-S2, no murmurs, rubs, gallops, 2+ distal pulses Abdomen: Soft, nontender, nondistended, no guarding or rebound tenderness, bowel sounds positive x4 Extremities: No deformities, no edema, normal bulk and tone Neuro: Sedated, withdraws from pain, pupils PERRLA Skin: Warm and dry.  No obvious rashes, lesions, ulceration  Assessment & Plan:   Acute hypoxic  respiratory failure secondary to COVID-19 pneumonia with ARDS, and small Right Upper Lobe PE -Mechanical ventilation via ARDS protocol, target PRVC 6 cc/kg -Wean PEEP and FiO2 as able to maintain O2 sats >88% -Goal plateau pressure less than 30, driving pressure less than 15 -Paralytics if necessary for vent synchrony, gas exchange -Cycle prone positioning if necessary for oxygenation -Deep sedation per PAD protocol, goal RASS -4, currently Dilaudid, versed, precedex -Diuresis as blood pressure and renal function can tolerate, goal CVP 5-8>>will diurese today 12/31  -VAP prevention order set -Completed course of Remdesivir and Barcinitib -Steroids>> On 40 mg of Prednisone will plan to taper -Follow inflammatory markers: Ferritin, D-dimer, CRP, IL-6, LDH -Assess for possible tocilizumab  -Vitamin C, zinc -Perform Recruitment Maneuvers -Pt unable to tolerate weaning trials, ENT consulted for Trach   Small Right Upper Lobe PE Bilateral LE DVT's -Currently on Heparin gtt, will transition to Lovenox therapeutic dose to assist with decreasing volume intake   Acute toxic metabolic encephalopathy, need for sedation -Increase RASS goal to -3 to -4 as she is dyssynchronous with the vent -Intermittent paralytics -Continue Dilaudid, Versed, Precedex -Increasing Klonopin to assist with weaning sedation                                                               -  Daily Wake up assessment -Provide supportive care   AKI -I&O's / urinary output -Follow BMP -Ensure adequate renal perfusion -Avoid nephrotoxic agents as able -Replace electrolytes as indicated -Nephrology following, appreciate input   Anemia without s/sx of Bleeding -Monitor for S/Sx of bleeding -Trend CBC -Heparin gtt for Anticoagulation/VTE Prophylaxis (will transition to treatment dose Lovenox)  -Transfuse for Hgb <7   Hyperglycemia -CBG's -SSI -Follow ICU Hypo/Hyperglycemia protocol      Best practice  (evaluated daily)  Diet: Tube Feeds, Dietician following Pain/Anxiety/Delirium protocol (if indicated): Dilaudid, Versed, Precedex VAP protocol (if indicated): yes DVT prophylaxis: Heparin gtt (plan to transition to Lovenox) GI prophylaxis: Protonix Glucose control: SSI  Mobility: Bedrest  Disposition: ICU   Goals of Care:  Last date of multidisciplinary goals of care discussion: 10/09/2020 Family and staff present: Bedside RN.  Updated pt's aunt Margaret Shepard via telephone Summary of discussion: Plan for trach.  Recruitment maneuvers, diuresis Follow up goals of care discussion due: 10/10/2020 Code Status: Limited Code (NO CPR, Defib, ACLS meds)  Labs   CBC: Recent Labs  Lab 10/05/20 0451 10/06/20 0500 10/07/20 0332 10/08/20 0422 10/09/20 0430  WBC 13.0* 11.8* 11.0* 11.1* 11.9*  HGB 9.4* 8.7* 8.5* 8.8* 8.9*  HCT 30.2* 27.8* 26.8* 26.7* 26.5*  MCV 91.5 91.4 89.9 89.0 88.6  PLT 288 226 200 211 181    Basic Metabolic Panel: Recent Labs  Lab 10/04/20 0315 10/05/20 0451 10/06/20 0457 10/06/20 0500 10/07/20 1000 10/08/20 0422 10/09/20 0430  NA 151* 149* 146*  --  142 142 146*  K 4.2 4.6 4.2  --  4.2 4.3 4.5  CL 112* 112* 108  --  105 104 104  CO2 29 27 29   --  30 29 32  GLUCOSE 135* 118* 130*  --  121* 130* 142*  BUN 104* 97* 88*  --  77* 78* 75*  CREATININE 2.82* 2.42* 2.50*  --  2.16* 2.24* 2.13*  CALCIUM 8.7* 8.9 8.6*  --  8.3* 8.6* 8.8*  MG 2.2 2.0  --  1.9  --  1.8 2.2  PHOS 5.1*  --  4.1  --  4.0 4.3 4.4   GFR: Estimated Creatinine Clearance: 26.4 mL/min (A) (by C-G formula based on SCr of 2.13 mg/dL (H)). Recent Labs  Lab 10/04/20 0315 10/05/20 0451 10/06/20 0500 10/07/20 0332 10/08/20 0422 10/09/20 0430  PROCALCITON 0.12 0.13 0.13  --   --   --   WBC 23.0* 13.0* 11.8* 11.0* 11.1* 11.9*    Liver Function Tests: Recent Labs  Lab 10/04/20 0315 10/06/20 0457 10/07/20 1000 10/08/20 0422 10/09/20 0430  ALBUMIN 2.9* 2.4* 2.0* 2.2* 2.3*   No  results for input(s): LIPASE, AMYLASE in the last 168 hours. No results for input(s): AMMONIA in the last 168 hours.  ABG    Component Value Date/Time   PHART 7.41 10/09/2020 0505   PCO2ART 56 (H) 10/09/2020 0505   PO2ART 63 (L) 10/09/2020 0505   HCO3 35.5 (H) 10/09/2020 0505   ACIDBASEDEF 1.8 10/02/2020 0442   O2SAT 91.9 10/09/2020 0505     Coagulation Profile: No results for input(s): INR, PROTIME in the last 168 hours.  Cardiac Enzymes: No results for input(s): CKTOTAL, CKMB, CKMBINDEX, TROPONINI in the last 168 hours.  HbA1C: Hgb A1c MFr Bld  Date/Time Value Ref Range Status  09/25/2020 06:21 AM 6.6 (H) 4.8 - 5.6 % Final    Comment:    (NOTE) Pre diabetes:          5.7%-6.4%  Diabetes:              >6.4%  Glycemic control for   <7.0% adults with diabetes     CBG: Recent Labs  Lab 10/08/20 1700 10/08/20 1944 10/08/20 2300 10/09/20 0316 10/09/20 0728  GLUCAP 250* 148* 175* 100* 108*    Review of Systems:   Unable to assess due to Critical illness, intubation and sedation   Past Medical History:  She,  has a past medical history of Anemia, Anxiety, Frequent headaches, Hypertension, Lumbago, and Seizures (HCC).   Surgical History:   Past Surgical History:  Procedure Laterality Date  . BRAIN SURGERY    . JOINT REPLACEMENT Bilateral   . JOINT REPLACEMENT Left    revision  . MANDIBLE SURGERY       Social History:   reports that she has quit smoking. She has never used smokeless tobacco. She reports current alcohol use. She reports that she does not use drugs.   Family History:  Her family history includes Alcohol abuse in her father; Arthritis in her mother; COPD in her mother and sister; Cancer in her maternal grandmother; Hyperlipidemia in her mother; Hypertension in her sister; Kidney disease in her mother; Stroke in her sister.   Allergies Allergies  Allergen Reactions  . Hydrocodone-Acetaminophen Nausea And Vomiting and Itching     Home  Medications  Prior to Admission medications   Medication Sig Start Date End Date Taking? Authorizing Provider  albuterol (VENTOLIN HFA) 108 (90 Base) MCG/ACT inhaler Inhale 2 puffs into the lungs every 4 (four) hours as needed for wheezing or shortness of breath. 09/22/20  Yes Cannady, Jolene T, NP  cyclobenzaprine (FLEXERIL) 5 MG tablet Take 5 mg by mouth 3 (three) times daily as needed for muscle spasms.   Yes [provider]  Dextromethorphan-guaiFENesin (CORICIDIN HBP CONGESTION/COUGH) 10-200 MG CAPS Take 1 capsule by mouth every 4 (four) hours as needed. 09/22/20  Yes Cannady, Jolene T, NP  gabapentin (NEURONTIN) 300 MG capsule Take 300 mg by mouth 4 (four) times daily.   Yes [provider]  hydrochlorothiazide (HYDRODIURIL) 12.5 MG tablet TAKE 1 TABLET BY MOUTH ONCE DAILY. Patient taking differently: Take 12.5 mg by mouth daily. 09/08/20  Yes Cannady, Jolene T, NP  oxyCODONE (OXYCONTIN) 30 MG 12 hr tablet Take 30 mg by mouth 3 (three) times daily.   Yes [provider]  oxyCODONE (ROXICODONE) 15 MG immediate release tablet Take 15 mg by mouth every 4 (four) hours as needed (breakthrough pain).   Yes [provider]     Critical care time: 35 minutes    Harlon Ditty, [facility] Pager: [phone]

## 2020-10-09 NOTE — Consult Note (Signed)
ANTICOAGULATION CONSULT NOTE   Pharmacy Consult for Lovenox Indication: DVT/PE  Patient Measurements: Weight: 66.9 kg  Labs: Recent Labs    10/07/20 0332 10/07/20 1000 10/08/20 0422 10/08/20 1254 10/08/20 2231 10/09/20 0430  HGB 8.5*  --  8.8*  --   --  8.9*  HCT 26.8*  --  26.7*  --   --  26.5*  PLT 200  --  211  --   --  181  HEPARINUNFRC 0.38  --  0.30 0.53 0.56 0.57  CREATININE  --  2.16* 2.24*  --   --  2.13*   Estimated Creatinine Clearance: 26.4 mL/min (A) (by C-G formula based on SCr of 2.13 mg/dL (H)).  Medical History: Past Medical History:  Diagnosis Date  . Anemia    noted during hip surgery   . Anxiety    states no longer issue  . Frequent headaches   . Hypertension   . Lumbago   . 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 Patient was admitted for acute hypoxic respiratory failure 2/2 PNA due to COVID-19. Pharmacy consulted for heparin dosing and monitoring for ACS and pulmonary embolism. CT chest with small pulmonary embolism right upper lobe. Trop elevated 260. CBC stable. No DOAC PTA.   Pharmacy now consulted to transition heparin infusion to Lovenox for treatment of VTE.    Plan:  --Stop IV heparin infusion --Start Lovenox 67.5 mg (1 mg/kg) q24h based on CrCl < 30 mL/min --Continue to monitor renal function and adjust dosing as indicated --ENT has been consulted and plan is for tracheostomy. Follow-up scheduled date of procedure and anticoagulation plan peri-operatively  Margaret Shepard 10/09/2020 11:48 AM

## 2020-10-09 NOTE — Progress Notes (Signed)
Recruitment maneuver done. Pt. Tolerated well. 

## 2020-10-09 NOTE — Consult Note (Signed)
Margaret Shepard, Margaret Shepard [id number] 1961/09/28 Salena Saner, MD  Reason for Consult: Evaluate for possible tracheostomy  HPI: The patient is a 59 year old white female with ARDS, was COVID-19 pneumonia, bilateral DVT with a small right upper lobe PE.  She presented with acute respiratory failure on 12/16 and required high oxygen support and eventually intubation.  She continues to require 50% O2 with high oxygen flow ventilatory support.  She is down a little bit better with decrease in sedation however is not advancing very rapidly.  Consultation has been placed for possible tracheostomy to assist with weaning.  Allergies:  Allergies  Allergen Reactions  . Hydrocodone-Acetaminophen Nausea And Vomiting and Itching    ROS: Review of systems normal other than 12 systems except per HPI.  PMH:  Past Medical History:  Diagnosis Date  . Anemia    noted during hip surgery   . Anxiety    states no longer issue  . Frequent headaches   . Hypertension   . Lumbago   . Seizures (HCC)    after brain surgery, no longer has these    FH:  Family History  Problem Relation Age of Onset  . Arthritis Mother   . COPD Mother   . Hyperlipidemia Mother   . Kidney disease Mother   . Alcohol abuse Father   . COPD Sister   . Stroke Sister   . Hypertension Sister   . Cancer Maternal Grandmother     SH:  Social History   Socioeconomic History  . Marital status: Divorced    Spouse name: Not on file  . Number of children: 0  . Years of education: Not on file  . Highest education level: Not on file  Occupational History  . Not on file  Tobacco Use  . Smoking status: Former Games developer  . Smokeless tobacco: Never Used  . Tobacco comment: 8-9 years ago  Vaping Use  . Vaping Use: Never used  Substance and Sexual Activity  . Alcohol use: Yes    Alcohol/week: 0.0 standard drinks    Comment: socially  . Drug use: No  . Sexual activity: Not Currently  Other Topics Concern  . Not on file  Social  History Narrative  . Not on file   Social Determinants of Health   Financial Resource Strain: Not on file  Food Insecurity: Not on file  Transportation Needs: Not on file  Physical Activity: Not on file  Stress: Not on file  Social Connections: Not on file  Intimate Partner Violence: Not on file    PSH:  Past Surgical History:  Procedure Laterality Date  . BRAIN SURGERY    . JOINT REPLACEMENT Bilateral   . JOINT REPLACEMENT Left    revision  . MANDIBLE SURGERY      Physical  Exam: The patient is intubated orally with tube secured around the head.  She does not have any obvious swelling inside her mouth.  Her neck shows no swelling and the laryngeal landmarks are easily palpable.  She is not obese.   A/P: Patient has prolonged intubation with failure to wean.  She is a candidate for tracheostomy.  This potentially can be done next week.  She currently is on anticoagulation because of DVT and a small pulmonary embolus.  This would need to be stopped temporarily to help prevent further bleeding at the tracheostomy site.  It can be restarted the day after surgery.  Will talk with surgery scheduling to see when we can potentially schedule this for  next week.  If she should improve and get to where she may not need further vent support then we can hold off on the tracheostomy and work towards decannulation and getting her on BiPAP instead.  If she does improve please contact me so that we can cancel the surgery.   Beverly Sessions Rhaya Coale 10/09/2020 11:07 AM

## 2020-10-09 NOTE — Progress Notes (Signed)
[place], [place] 10/09/20  Subjective:   Hospital day # 15  Urine output yesterday was 3.3 L. Creatinine stable at 2.1.   Renal: 12/30 0701 - 12/31 0700 In: 5027.8 [I.V.:1157.8; NG/GT:3670; IV Piggyback:200] Out: 5697 [Urine:3325; Stool:100] Lab Results  Component Value Date   CREATININE 2.13 (H) 10/09/2020   CREATININE 2.24 (H) 10/08/2020   CREATININE 2.16 (H) 10/07/2020     Objective:  Vital signs in last 24 hours:  Temp:  [95.18 F (35.1 C)-99.32 F (37.4 C)] 95.18 F (35.1 C) (12/31 1300) Pulse Rate:  [69-122] 111 (12/31 1300) Resp:  [13-32] 16 (12/31 1300) BP: (107-197)/(67-87) 124/67 (12/31 1300) SpO2:  [86 %-96 %] 94 % (12/31 1412) FiO2 (%):  [45 %-50 %] 50 % (12/31 1412) Weight:  [66.9 kg] 66.9 kg (12/31 0419)  Weight change: -5.2 kg Filed Weights   10/07/20 0500 10/08/20 0418 10/09/20 0419  Weight: 75 kg 72.1 kg 66.9 kg    Intake/Output:    Intake/Output Summary (Last 24 hours) at 10/09/2020 1443 Last data filed at 10/09/2020 1300 Gross per 24 hour  Intake 4978.79 ml  Output 4175 ml  Net 803.79 ml    Physical Exam: General:  Critically ill-appearing  HEENT  ETT in place, OGT  Pulm/lungs  vent assisted FiO2 50%, respiratory rate 20  CVS/Heart  regular rhythm  Abdomen:   Nondistended  Extremities:  + peripheral edema  Neurologic:  sedated  Skin:  No acute rashes  Foley in place    Basic Metabolic Panel:  Recent Labs  Lab 10/04/20 0315 10/05/20 0451 10/06/20 0457 10/06/20 0500 10/07/20 1000 10/08/20 0422 10/09/20 0430  NA 151* 149* 146*  --  142 142 146*  K 4.2 4.6 4.2  --  4.2 4.3 4.5  CL 112* 112* 108  --  105 104 104  CO2 _0 --  30 29 32  GLUCOSE 135* 118* 130*  --  121* 130* 142*  BUN 104* 97* 88*  --  77* 78* 75*  CREATININE 2.82* 2.42* 2.50*  --  2.16* 2.24* 2.13*  CALCIUM 8.7* 8.9 8.6*  --  8.3* 8.6* 8.8*  MG 2.2 2.0  --  1.9  --  1.8 2.2  PHOS 5.1*  --  4.1  --  4.0 4.3 4.4      CBC: Recent Labs  Lab 10/05/20 0451 10/06/20 0500 10/07/20 0332 10/08/20 0422 10/09/20 0430  WBC 13.0* 11.8* 11.0* 11.1* 11.9*  HGB 9.4* 8.7* 8.5* 8.8* 8.9*  HCT 30.2* 27.8* 26.8* 26.7* 26.5*  MCV 91.5 91.4 89.9 89.0 88.6  PLT 288 226 200 211 181      Lab Results  Component Value Date   HEPBSAG NON REACTIVE 09/27/2020      Microbiology:  No results found for this or any previous visit (from the past 240 hour(s)).  Coagulation Studies: No results for input(s): LABPROT, INR in the last 72 hours.  Urinalysis: No results for input(s): COLORURINE, LABSPEC, PHURINE, GLUCOSEU, HGBUR, BILIRUBINUR, KETONESUR, PROTEINUR, UROBILINOGEN, NITRITE, LEUKOCYTESUR in the last 72 hours.  Invalid input(s): APPERANCEUR    Imaging: No results found.   Medications:   . sodium chloride 5 mL/hr at 10/09/20 0900  . ceFEPime (MAXIPIME) IV Stopped (10/08/20 2342)  . dexmedetomidine (PRECEDEX) IV infusion 1.2 mcg/kg/hr (10/09/20 1418)  . feeding supplement (VITAL 1.5 CAL) 55 mL/hr at 10/09/20 0500  . HYDROmorphone 4 mg/hr (10/09/20 1420)  . midazolam 5.5 mg/hr (10/09/20 1131)   . vitamin C  500  mg Per Tube Daily  . chlorhexidine gluconate (MEDLINE KIT)  15 mL Mouth Rinse BID  . [START ON 10/10/2020] Chlorhexidine Gluconate Cloth  6 each Topical Q0600  . clonazePAM  2 mg Oral BID  . enoxaparin (LOVENOX) injection  1 mg/kg Subcutaneous Q24H  . feeding supplement (PROSource TF)  45 mL Per Tube Daily  . folic acid  1 mg Per Tube Daily  . free water  200 mL Per Tube Q6H  . insulin aspart  0-15 Units Subcutaneous Q4H  . insulin detemir  5 Units Subcutaneous BID  . mouth rinse  15 mL Mouth Rinse 10 times per day  . metoprolol tartrate  25 mg Oral BID  . multivitamin with minerals  1 tablet Per Tube Daily  . oxyCODONE  15 mg Oral Q6H  . pantoprazole (PROTONIX) IV  40 mg Intravenous Q24H  . predniSONE  40 mg Oral Q breakfast   Followed by  . [START ON 10/11/2020] predniSONE  30 mg  Oral Q breakfast   Followed by  . [START ON 10/16/2020] predniSONE  20 mg Oral Q breakfast   Followed by  . [START ON 2020-11-20] predniSONE  10 mg Oral Q breakfast   Followed by  . [START ON 10/26/2020] predniSONE  5 mg Oral Q breakfast  . QUEtiapine  50 mg Oral BID  . thiamine  100 mg Per Tube Daily  . zinc sulfate  220 mg Per Tube Daily   acetaminophen, labetalol, midazolam, morphine injection, ondansetron **OR** ondansetron (ZOFRAN) IV, vecuronium  Assessment/ Plan:  59 y.o. female with  hypertension, anxiety, anemia who was brought to the ER on December 16 with a chief complaint of cough shortness of breath   admitted on 09/23/2020 for Edema [R60.9] Lactic acid acidosis [E87.2] Acute respiratory failure with hypoxia ([place]) [J96.01] Pneumonia due to COVID-19 virus [U07.1, J12.82]   # Acute Kidney injury with proteinuria AKI likely secondary to ATN and iv contrast exposure (12/172021) 12/30 0701 - 12/31 0700 In: 5027.8 [I.V.:1157.8; YQ/MG:5003; IV Piggyback:200] Out: 7048 [Urine:3325; Stool:100]   Lab Results  Component Value Date   CREATININE 2.13 (H) 10/09/2020   CREATININE 2.24 (H) 10/08/2020   CREATININE 2.16 (H) 10/07/2020    Renal function stable with a creatinine of 2.1.  Good urine output at 3.3 L.  No indication for dialysis.  #Hypernatremia Due to brisk diuresis -Sodium slightly high at 146.  Continue to monitor.  # Covid 19 pneumonia/acute respiratory failure Complicated by Acute resp failure with hypoxia Small pulmonary embolism right upper lobe pulmonary arteries in a distal pulmonary artery- requiring iv anticoagulation -Weaning from the ventilator as per pulmonary/critical care.  # hyperphosphatemia Lab Results  Component Value Date   PTH 109 (H) 09/29/2020   CALCIUM 8.8 (L) 10/09/2020   PHOS 4.4 10/09/2020    Phosphorus remains at target at 4.4.   LOS: 15 Ozie Dimaria 12/31/20212:43 PM  [place],  [place]  Note: This note was prepared with Dragon dictation. Any transcription errors are unintentional

## 2020-10-10 ENCOUNTER — Inpatient Hospital Stay: Payer: Medicaid Other

## 2020-10-10 DIAGNOSIS — J9600 Acute respiratory failure, unspecified whether with hypoxia or hypercapnia: Secondary | ICD-10-CM | POA: Diagnosis not present

## 2020-10-10 DIAGNOSIS — L8990 Pressure ulcer of unspecified site, unspecified stage: Secondary | ICD-10-CM | POA: Insufficient documentation

## 2020-10-10 DIAGNOSIS — U071 COVID-19: Secondary | ICD-10-CM | POA: Diagnosis not present

## 2020-10-10 DIAGNOSIS — J1282 Pneumonia due to coronavirus disease 2019: Secondary | ICD-10-CM | POA: Diagnosis not present

## 2020-10-10 LAB — CBC
HCT: 28.6 % — ABNORMAL LOW (ref 36.0–46.0)
Hemoglobin: 8.7 g/dL — ABNORMAL LOW (ref 12.0–15.0)
MCH: 28.8 pg (ref 26.0–34.0)
MCHC: 30.4 g/dL (ref 30.0–36.0)
MCV: 94.7 fL (ref 80.0–100.0)
Platelets: 176 10*3/uL (ref 150–400)
RBC: 3.02 MIL/uL — ABNORMAL LOW (ref 3.87–5.11)
RDW: 14.8 % (ref 11.5–15.5)
WBC: 12.2 10*3/uL — ABNORMAL HIGH (ref 4.0–10.5)
nRBC: 0 % (ref 0.0–0.2)

## 2020-10-10 LAB — BLOOD GAS, ARTERIAL
Acid-Base Excess: 6.8 mmol/L — ABNORMAL HIGH (ref 0.0–2.0)
Bicarbonate: 34.5 mmol/L — ABNORMAL HIGH (ref 20.0–28.0)
FIO2: 0.5
MECHVT: 450 mL
O2 Saturation: 96.6 %
PEEP: 8 cmH2O
Patient temperature: 37
RATE: 26 resp/min
pCO2 arterial: 67 mmHg (ref 32.0–48.0)
pH, Arterial: 7.32 — ABNORMAL LOW (ref 7.350–7.450)
pO2, Arterial: 93 mmHg (ref 83.0–108.0)

## 2020-10-10 LAB — RENAL FUNCTION PANEL
Albumin: 2.3 g/dL — ABNORMAL LOW (ref 3.5–5.0)
Anion gap: 11 (ref 5–15)
BUN: 79 mg/dL — ABNORMAL HIGH (ref 6–20)
CO2: 32 mmol/L (ref 22–32)
Calcium: 8.6 mg/dL — ABNORMAL LOW (ref 8.9–10.3)
Chloride: 99 mmol/L (ref 98–111)
Creatinine, Ser: 1.85 mg/dL — ABNORMAL HIGH (ref 0.44–1.00)
GFR, Estimated: 31 mL/min — ABNORMAL LOW (ref >60)
Glucose, Bld: 157 mg/dL — ABNORMAL HIGH (ref 70–99)
Phosphorus: 5.5 mg/dL — ABNORMAL HIGH (ref 2.5–4.6)
Potassium: 5.5 mmol/L — ABNORMAL HIGH (ref 3.5–5.1)
Sodium: 142 mmol/L (ref 135–145)

## 2020-10-10 LAB — GLUCOSE, CAPILLARY
Glucose-Capillary: 161 mg/dL — ABNORMAL HIGH (ref 70–99)
Glucose-Capillary: 130 mg/dL — ABNORMAL HIGH (ref 70–99)
Glucose-Capillary: 155 mg/dL — ABNORMAL HIGH (ref 70–99)
Glucose-Capillary: 220 mg/dL — ABNORMAL HIGH (ref 70–99)
Glucose-Capillary: 212 mg/dL — ABNORMAL HIGH (ref 70–99)

## 2020-10-10 LAB — MAGNESIUM: Magnesium: 2.3 mg/dL (ref 1.7–2.4)

## 2020-10-10 MED ORDER — MIDAZOLAM BOLUS VIA INFUSION
2.0000 mg | INTRAVENOUS | Status: DC | PRN
  Administered 2020-10-10 – 2020-10-13 (×2): 4 mg via INTRAVENOUS
  Administered 2020-10-16 (×2): 2 mg via INTRAVENOUS
  Filled 2020-10-10: qty 4

## 2020-10-10 MED ORDER — ENOXAPARIN SODIUM 80 MG/0.8ML ~~LOC~~ SOLN
1.0000 mg/kg | Freq: Two times a day (BID) | SUBCUTANEOUS | Status: DC
  Administered 2020-10-10 – 2020-10-13 (×6): 67.5 mg via SUBCUTANEOUS
  Filled 2020-10-10 (×6): qty 0.8

## 2020-10-10 MED ORDER — PATIROMER SORBITEX CALCIUM 8.4 G PO PACK
8.4000 g | PACK | Freq: Every day | ORAL | Status: DC
  Administered 2020-10-10 – 2020-10-15 (×6): 8.4 g via ORAL
  Filled 2020-10-10 (×9): qty 1

## 2020-10-10 MED ORDER — FAMOTIDINE 20 MG PO TABS
20.0000 mg | ORAL_TABLET | Freq: Every day | ORAL | Status: DC
  Administered 2020-10-10 – 2020-10-16 (×7): 20 mg via ORAL
  Filled 2020-10-10 (×7): qty 1

## 2020-10-10 NOTE — Consult Note (Addendum)
ANTICOAGULATION CONSULT NOTE   Pharmacy Consult for Lovenox Indication: DVT/PE  Patient Measurements: Weight: 66.9 kg  Labs: Recent Labs    10/08/20 0422 10/08/20 1254 10/08/20 2231 10/09/20 0430 10/10/20 0457  HGB 8.8*  --   --  8.9* 8.7*  HCT 26.7*  --   --  26.5* 28.6*  PLT 211  --   --  181 176  HEPARINUNFRC 0.30 0.53 0.56 0.57  --   CREATININE 2.24*  --   --  2.13* 1.85*   Estimated Creatinine Clearance: 30.4 mL/min (A) (by C-G formula based on SCr of 1.85 mg/dL (H)).  Medical History: Past Medical History:  Diagnosis Date  . Anemia    noted during hip surgery   . Anxiety    states no longer issue  . Frequent headaches   . Hypertension   . Lumbago   . Seizures (HCC)    after brain surgery, no longer has these     Assessment: 60yo female who presented to ED on 12/16 with worsening SOB, cough, nausea, diarrhea, and poor PO intake. Patient was admitted for acute hypoxic respiratory failure 2/2 PNA due to COVID-19. Pharmacy consulted for heparin dosing and monitoring for ACS and pulmonary embolism. CT chest with small pulmonary embolism right upper lobe. Trop elevated 260. CBC stable. No DOAC PTA.   Pharmacy now consulted to transition heparin infusion to Lovenox for treatment of VTE.    Plan:  --Increase Lovenox 67.5 mg (1 mg/kg) to q12h based on CrCl > 30 mL/min --Continue to monitor renal function and adjust dosing as indicated --ENT has been consulted and plan is for tracheostomy. Follow-up scheduled date of procedure and anticoagulation plan peri-operatively  Albina Billet, PharmD, BCPS Clinical Pharmacist 10/10/2020 11:41 AM

## 2020-10-10 NOTE — Progress Notes (Signed)
NAME:  Margaret Shepard, MRN:  [id number], DOB:  28-Jun-1961, LOS: 47 ADMISSION DATE:  10/03/2020, CONSULTATION DATE: 09/11/2020 REFERRING MD: Dr. Tamala Julian, CHIEF COMPLAINT: Shortness of Breath   Brief History:  60 yo female admitted with acute hypoxic respiratory failure secondary to pneumonia due to COVID-19 requiring HHFNC and NRB now intubated and severe resp failure complicated by DVT and PE  History of Present Illness:  This is a 60 yo unvaccinated female who presented to [facility] ER on 12/16 via EMS with c/o worsening shortness of breath, cough, nausea, diarrhea, and poor po intake.  Per ER notes pt tested positive for COVID-19 8 days prior to ER presentation.  EMS reported pt severely hypoxic with O2 sats less than 50% on RA.  Therefore she was placed on 15L NRB with O2 sats increasing to 81%.  Upon arrival to the ER pt transitioned to [facility] and NRB due to continued hypoxia and tachypnea.  Lab results revealed CO2 19, glucose 174, anon gap 18, LDH 646, troponin 260, ferritin 478, pct <0.10, lactic acid 4.9, BNP 294, and vbg pH 7.41/pCO2 37/bicarb 23.5.  COVID-19 positive and CXR concerning for multifocal pneumonia.  There was also concern of possible pulmonary embolism, however due to severe hypoxia pt unable to tolerate transfer for CTA Chest.  Therefore, heparin gtt initiated.  Pt also received aspirin, decadron, ibuprofen, zofran, and remdesivir.  She was subsequently admitted to ICU per PCCM team for additional workup and treatment, but remained in the ER pending ICU bed availability.   Significant Hospital Events:  12/16: Pt admitted to ICU with COVID-19 pneumonia requiring HHFNC and NRB remained in the ER pending ICU bed availability  12/17 high risk for cardiac arrest, on pressors 12/18 Off pressors 12/19 worsening renal function.  Nephrology consult 09/28/20- patient was noted to be severely acidemic with HAGMA in am s/p 2 amp bicarb with repeat ABG significantly improved 12/21- patient  will require HD, will place vascath today. Meds reviewed and changed, TG elevated on propofol. DCd IVF. 12/22-  Patient remains critically ill. Reviewed plan with nephrologist, patient will not have HD today. 10/01/20- patient on 35%FIo2 8peep, she diruesed >3L we will hold HD, continue diuresis. SBT.  10/02/20- patient with tachyarrythmia overnight.  S/p cardiology evaluation. For SBT today.  10/03/20- patient with no overnight events, remains critically ill on MV.  Diuresed well overnight.  Discussed care plan with nephrologist today.  10/04/20-  Patient remains critically ill.  She had SBT daily and has not been able to pass yet.  10/05/20-with no acute events overnight remains on PEEP of 8 and FiO2 of 55% 10/06/20-Patient on peep of 8 and FiO2 60%. Trying to weansedation but very difficult in spite of using oral medications 10/07/20-patient tolerated 5/5 trial for over 30 minutes.  She was able to follow commands today.  She was able to wiggle her right toes.  She was able to stick out her tongue. 10/08/2020- Lightly sedated, able to follow some simple commands.  Currently on 50% FiO2 & 8 PEEP, increased work of breathing on vent.  Consult ENT for Trach. 10/09/2020- Pt very asynchronous with vent, increasing sedation with prn Paralytics.  Begin Recruitment maneuvers and Diurese today;  ENT evaluated pt, tentative plan for trach some time next week 10/10/2020-switch to [facility], adjusted ventilator settings  Consults:  [place]  Nephrology ENT  Procedures:  12/17: Endotracheal intubation 12/21: Left IJ Trialysis catheter inserted  Significant Diagnostic Tests:  12/16: CXR revealed multifocal pneumonia  12/17: Venous US  Bilateral LE>>Positive for BILATERAL lower extremity DVT, more extensive on the Left. 12/17: CTA Chest>>Small pulmonary embolism right upper lobe.Diffuse bilateral airspace disease compatible with COVID pneumonia. Bibasilar atelectasis. 12/19: Renal US>>1. Increased  echogenicity of bilateral kidneys as can be seen in medical renal disease. No hydronephrosis.  Micro Data:  COVID-19 12/16>>positive  Influenza PCR 12/16>>negative  Urine 12/16>> no growth Blood x2 12/16>>STAPHYLOCOCCUS HOMINIS  Blood 12/15>> no growth MRSA PCR 12/17>>negative  Antimicrobials:  Remdesivir 12/16>>12/20 Azithromycin 12/16>>12/17 Rocephin 12/16>>12/17 Vancomycin 12/18>>12/19 Cefepime 12/26 (plan for 5 days)>>  Interim History / Subjective:  No events reported overnight Afebrile Very asynchronous with vent Sedated with Dilaudid, versed, precedex No vasopressors, actually hypertensive with agitation  Objective   Blood pressure 117/68, pulse 86, temperature 97.7 F (36.5 C), resp. rate 20, height '5\' 4"'  (1.626 m), weight 66.9 kg, SpO2 94 %.    Vent Mode: PRVC FiO2 (%):  [50 %] 50 % Set Rate:  [26 bmp-32 bmp] 26 bmp Vt Set:  [400 mL-450 mL] 450 mL PEEP:  [8 cmH20] 8 cmH20 Plateau Pressure:  [10 QXI50-38 cmH20] 10 cmH20   Intake/Output Summary (Last 24 hours) at 10/10/2020 1816 Last data filed at 10/10/2020 1700 Gross per 24 hour  Intake 3018.78 ml  Output 2150 ml  Net 868.78 ml   Filed Weights   10/08/20 0418 10/09/20 0419 10/10/20 0500  Weight: 72.1 kg 66.9 kg 66.9 kg    Examination: General: Critically ill-appearing female,intubated and sedated, synchronous with the ventilator  HEENT: Atraumatic, normocephalic, neck supple, no JVD Lungs: Coarse rhonchi throughout, no wheezing, asynchronous with ventilator accessory muscle use Cardiovascular: Tachycardia, regular rhythm, S1-S2, no murmurs, rubs, gallops, 2+ distal pulses Abdomen: Soft, nontender, nondistended, no guarding or rebound tenderness, bowel sounds positive x4 Extremities: No deformities, no edema, normal bulk and tone Neuro: Sedated, withdraws from pain, pupils PERRLA Skin: Warm and dry.  No obvious rashes, lesions, ulceration  Assessment & Plan:   Acute hypoxic respiratory failure secondary  to COVID-19 pneumonia with ARDS, and small Right Upper Lobe PE -Continue mechanical ventilation ventilator adjustments made -Discussed with RT during rounds -Wean PEEP and FiO2 as able to maintain O2 sats >88% -Goal plateau pressure less than 30, driving pressure less than 15 -Paralytics if necessary for vent synchrony, gas exchange -Cycle prone positioning if necessary for oxygenation -Deep sedation per PAD protocol, goal RASS -4, currently Dilaudid, versed, precedex -Diuresis as blood pressure and renal function can tolerate -VAP prevention order set -Completed course of Remdesivir and Barcinitib -Steroids>> On 40 mg of Prednisone will plan to taper -Follow inflammatory markers: Ferritin, D-dimer, CRP, IL-6, LDH -Vitamin C, zinc -Continue recruitment maneuvers -Pt unable to tolerate weaning trials, ENT consulted for Trach, tentatively planned next week   Small Right Upper Lobe PE Bilateral LE DVT's -Currently on Heparin gtt, will transition to Lovenox therapeutic dose to assist with decreasing volume intake   Acute toxic metabolic encephalopathy, need for sedation -Increase RASS goal to -3 to -4 as she is dyssynchronous with the vent -Intermittent paralytics -Continue Dilaudid, Versed, Precedex -Increasing Klonopin to assist with weaning sedation                                                               -Daily Wake up assessment -Provide supportive care   AKI -  I&O's / urinary output -Follow BMP -Ensure adequate renal perfusion -Avoid nephrotoxic agents as able -Replace electrolytes as indicated -Nephrology following, appreciate input   Anemia without s/sx of Bleeding -Monitor for S/Sx of bleeding -Trend CBC -Heparin gtt for Anticoagulation/VTE Prophylaxis (will transition to treatment dose Lovenox)  -Transfuse for Hgb <7   Hyperglycemia -CBG's -SSI -Follow ICU Hypo/Hyperglycemia protocol      Best practice (evaluated daily)  Diet: Tube Feeds, Dietician  following Pain/Anxiety/Delirium protocol (if indicated): Dilaudid, Versed, Precedex VAP protocol (if indicated): yes DVT prophylaxis: Heparin gtt (plan to transition to Lovenox) GI prophylaxis: Protonix Glucose control: SSI  Mobility: Bedrest  Disposition: ICU   Goals of Care:  Last date of multidisciplinary goals of care discussion: 10/09/2020 Family and staff present: Bedside RN.  Updated pt's aunt Alver Sorrow via telephone Summary of discussion: Plan for trach.  Recruitment maneuvers, diuresis Follow up goals of care discussion due: 10/10/2020 Code Status: Limited Code (NO CPR, Defib, ACLS meds)  Labs   CBC: Recent Labs  Lab 10/06/20 0500 10/07/20 0332 10/08/20 0422 10/09/20 0430 10/10/20 0457  WBC 11.8* 11.0* 11.1* 11.9* 12.2*  HGB 8.7* 8.5* 8.8* 8.9* 8.7*  HCT 27.8* 26.8* 26.7* 26.5* 28.6*  MCV 91.4 89.9 89.0 88.6 94.7  PLT 226 200 211 181 361    Basic Metabolic Panel: Recent Labs  Lab 10/05/20 0451 10/06/20 0457 10/06/20 0500 10/07/20 1000 10/08/20 0422 10/09/20 0430 10/10/20 0457  NA 149* 146*  --  142 142 146* 142  K 4.6 4.2  --  4.2 4.3 4.5 5.5*  CL 112* 108  --  105 104 104 99  CO2 27 29  --  30 29 32 32  GLUCOSE 118* 130*  --  121* 130* 142* 157*  BUN 97* 88*  --  77* 78* 75* 79*  CREATININE 2.42* 2.50*  --  2.16* 2.24* 2.13* 1.85*  CALCIUM 8.9 8.6*  --  8.3* 8.6* 8.8* 8.6*  MG 2.0  --  1.9  --  1.8 2.2 2.3  PHOS  --  4.1  --  4.0 4.3 4.4 5.5*   GFR: Estimated Creatinine Clearance: 30.4 mL/min (A) (by C-G formula based on SCr of 1.85 mg/dL (H)). Recent Labs  Lab 10/04/20 0315 10/05/20 0451 10/06/20 0500 10/07/20 0332 10/08/20 0422 10/09/20 0430 10/10/20 0457  PROCALCITON 0.12 0.13 0.13  --   --   --   --   WBC 23.0* 13.0* 11.8* 11.0* 11.1* 11.9* 12.2*    Liver Function Tests: Recent Labs  Lab 10/06/20 0457 10/07/20 1000 10/08/20 0422 10/09/20 0430 10/10/20 0457  ALBUMIN 2.4* 2.0* 2.2* 2.3* 2.3*   No results for input(s): LIPASE,  AMYLASE in the last 168 hours. No results for input(s): AMMONIA in the last 168 hours.  ABG    Component Value Date/Time   PHART 7.32 (L) 10/10/2020 1405   PCO2ART 67 (HH) 10/10/2020 1405   PO2ART 93 10/10/2020 1405   HCO3 34.5 (H) 10/10/2020 1405   ACIDBASEDEF 1.8 10/02/2020 0442   O2SAT 96.6 10/10/2020 1405     Coagulation Profile: No results for input(s): INR, PROTIME in the last 168 hours.  Cardiac Enzymes: No results for input(s): CKTOTAL, CKMB, CKMBINDEX, TROPONINI in the last 168 hours.  HbA1C: Hgb A1c MFr Bld  Date/Time Value Ref Range Status  09/25/2020 06:21 AM 6.6 (H) 4.8 - 5.6 % Final    Comment:    (NOTE) Pre diabetes:          5.7%-6.4%  Diabetes:              >  6.4%  Glycemic control for   <7.0% adults with diabetes     CBG: Recent Labs  Lab 10/09/20 2339 10/10/20 0332 10/10/20 0743 10/10/20 1113 10/10/20 1650  GLUCAP 125* 161* 130* 155* 220*    Review of Systems:   Unable to assess due to Critical illness, intubation and sedation  Allergies Allergies  Allergen Reactions  . Hydrocodone-Acetaminophen Nausea And Vomiting and Itching    Scheduled Meds: . vitamin C  500 mg Per Tube Daily  . chlorhexidine gluconate (MEDLINE KIT)  15 mL Mouth Rinse BID  . Chlorhexidine Gluconate Cloth  6 each Topical Q0600  . clonazePAM  2 mg Oral BID  . enoxaparin (LOVENOX) injection  1 mg/kg Subcutaneous Q12H  . famotidine  20 mg Oral Daily  . feeding supplement (PROSource TF)  45 mL Per Tube Daily  . folic acid  1 mg Per Tube Daily  . free water  200 mL Per Tube Q6H  . insulin aspart  0-15 Units Subcutaneous Q4H  . insulin detemir  5 Units Subcutaneous BID  . mouth rinse  15 mL Mouth Rinse 10 times per day  . metoprolol tartrate  25 mg Oral BID  . multivitamin with minerals  1 tablet Per Tube Daily  . oxyCODONE  15 mg Oral Q6H  . patiromer  8.4 g Oral Daily  . [START ON 10/11/2020] predniSONE  30 mg Oral Q breakfast   Followed by  . [START ON 10/16/2020]  predniSONE  20 mg Oral Q breakfast   Followed by  . [START ON 10/27/2020] predniSONE  10 mg Oral Q breakfast   Followed by  . [START ON 10/26/2020] predniSONE  5 mg Oral Q breakfast  . QUEtiapine  50 mg Oral BID  . thiamine  100 mg Per Tube Daily  . zinc sulfate  220 mg Per Tube Daily   Continuous Infusions: . sodium chloride 5 mL/hr at 10/10/20 1121  . ceFEPime (MAXIPIME) IV Stopped (10/09/20 2242)  . dexmedetomidine (PRECEDEX) IV infusion 1.2 mcg/kg/hr (10/10/20 1759)  . feeding supplement (VITAL 1.5 CAL) 55 mL/hr at 10/09/20 0500  . HYDROmorphone 4 mg/hr (10/10/20 1652)  . midazolam 8 mg/hr (10/10/20 1116)   PRN Meds:.acetaminophen, labetalol, midazolam, morphine injection, ondansetron **OR** ondansetron (ZOFRAN) IV, vecuronium     Critical care time: 45 minutes    C. Derrill Kay, MD [place] [facility]   *This note was dictated using voice recognition software/Dragon.  Despite best efforts to proofread, errors can occur which can change the meaning.  Any change was purely unintentional.

## 2020-10-10 NOTE — Progress Notes (Signed)
[place], [place] 10/10/20  Subjective:   Hospital day # 16  Patient remains critically ill. Still on the ventilator. Requiring 50% FiO2. Good urine output of 2.3 L. Creatinine currently 1.85. Potassium slightly high at 5.5.  Renal: 12/31 0701 - 01/01 0700 In: 2002.4 [I.V.:1085.1; NG/GT:817.4; IV Piggyback:100] Out: 2325 [Urine:2325] Lab Results  Component Value Date   CREATININE 1.85 (H) 10/10/2020   CREATININE 2.13 (H) 10/09/2020   CREATININE 2.24 (H) 10/08/2020     Objective:  Vital signs in last 24 hours:  Temp:  [93.92 F (34.4 C)-98.78 F (37.1 C)] 98.6 F (37 C) (01/01 0800) Pulse Rate:  [88-111] 94 (01/01 0800) Resp:  [16-32] 32 (01/01 0800) BP: (104-204)/(67-98) 204/94 (01/01 0800) SpO2:  [88 %-100 %] 100 % (01/01 0829) FiO2 (%):  [50 %] 50 % (01/01 0829) Weight:  [66.9 kg] 66.9 kg (01/01 0500)  Weight change: 0 kg Filed Weights   10/08/20 0418 10/09/20 0419 10/10/20 0500  Weight: 72.1 kg 66.9 kg 66.9 kg    Intake/Output:    Intake/Output Summary (Last 24 hours) at 10/10/2020 1210 Last data filed at 10/10/2020 1121 Gross per 24 hour  Intake 2911.42 ml  Output 2800 ml  Net 111.42 ml    Physical Exam: General:  Critically ill-appearing  HEENT  ETT in place, OGT  Pulm/lungs  vent assisted FiO2 50%, respiratory rate 17  CVS/Heart  regular rhythm  Abdomen:   Nondistended  Extremities:  + peripheral edema  Neurologic:  sedated  Skin:  No acute rashes  Foley in place    Basic Metabolic Panel:  Recent Labs  Lab 10/05/20 0451 10/06/20 0457 10/06/20 0500 10/07/20 1000 10/08/20 0422 10/09/20 0430 10/10/20 0457  NA 149* 146*  --  142 142 146* 142  K 4.6 4.2  --  4.2 4.3 4.5 5.5*  CL 112* 108  --  105 104 104 99  CO2 27 29  --  30 29 32 32  GLUCOSE 118* 130*  --  121* 130* 142* 157*  BUN 97* 88*  --  77* 78* 75* 79*  CREATININE 2.42* 2.50*  --  2.16* 2.24* 2.13* 1.85*  CALCIUM 8.9 8.6*  --  8.3* 8.6* 8.8*  8.6*  MG 2.0  --  1.9  --  1.8 2.2 2.3  PHOS  --  4.1  --  4.0 4.3 4.4 5.5*     CBC: Recent Labs  Lab 10/06/20 0500 10/07/20 0332 10/08/20 0422 10/09/20 0430 10/10/20 0457  WBC 11.8* 11.0* 11.1* 11.9* 12.2*  HGB 8.7* 8.5* 8.8* 8.9* 8.7*  HCT 27.8* 26.8* 26.7* 26.5* 28.6*  MCV 91.4 89.9 89.0 88.6 94.7  PLT 226 200 211 181 176      Lab Results  Component Value Date   HEPBSAG NON REACTIVE 09/27/2020      Microbiology:  No results found for this or any previous visit (from the past 240 hour(s)).  Coagulation Studies: No results for input(s): LABPROT, INR in the last 72 hours.  Urinalysis: No results for input(s): COLORURINE, LABSPEC, PHURINE, GLUCOSEU, HGBUR, BILIRUBINUR, KETONESUR, PROTEINUR, UROBILINOGEN, NITRITE, LEUKOCYTESUR in the last 72 hours.  Invalid input(s): APPERANCEUR    Imaging: DG Chest Port 1 View  Result Date: 10/10/2020 CLINICAL DATA:  Acute respiratory failure with hypoxia EXAM: PORTABLE CHEST 1 VIEW COMPARISON:  10/04/2020 chest radiograph. FINDINGS: Endotracheal tube tip is 5.2 cm above the carina. Enteric tube enters stomach with the tip not seen on this image. Left internal jugular central venous catheter  terminates in the upper third of the SVC. Stable cardiomediastinal silhouette with normal heart size. No pneumothorax. No pleural effusion. Patchy opacities throughout the left greater than right lungs, similar to minimally improved. IMPRESSION: 1. Well-positioned support structures. 2. Patchy opacities throughout the left greater than right lungs, similar to minimally improved, compatible with multilobar pneumonia. Electronically Signed   By: Ilona Sorrel M.D.   On: 10/10/2020 07:36     Medications:   . sodium chloride 5 mL/hr at 10/10/20 1121  . ceFEPime (MAXIPIME) IV Stopped (10/09/20 2242)  . dexmedetomidine (PRECEDEX) IV infusion 1.2 mcg/kg/hr (10/10/20 1121)  . feeding supplement (VITAL 1.5 CAL) 55 mL/hr at 10/09/20 0500  . HYDROmorphone  4 mg/hr (10/10/20 1121)  . midazolam 8 mg/hr (10/10/20 1116)   . vitamin C  500 mg Per Tube Daily  . chlorhexidine gluconate (MEDLINE KIT)  15 mL Mouth Rinse BID  . Chlorhexidine Gluconate Cloth  6 each Topical Q0600  . clonazePAM  2 mg Oral BID  . enoxaparin (LOVENOX) injection  1 mg/kg Subcutaneous Q12H  . famotidine  20 mg Oral Daily  . feeding supplement (PROSource TF)  45 mL Per Tube Daily  . folic acid  1 mg Per Tube Daily  . free water  200 mL Per Tube Q6H  . insulin aspart  0-15 Units Subcutaneous Q4H  . insulin detemir  5 Units Subcutaneous BID  . mouth rinse  15 mL Mouth Rinse 10 times per day  . metoprolol tartrate  25 mg Oral BID  . multivitamin with minerals  1 tablet Per Tube Daily  . oxyCODONE  15 mg Oral Q6H  . [START ON 10/11/2020] predniSONE  30 mg Oral Q breakfast   Followed by  . [START ON 10/16/2020] predniSONE  20 mg Oral Q breakfast   Followed by  . [START ON 10-22-2020] predniSONE  10 mg Oral Q breakfast   Followed by  . [START ON 10/26/2020] predniSONE  5 mg Oral Q breakfast  . QUEtiapine  50 mg Oral BID  . thiamine  100 mg Per Tube Daily  . zinc sulfate  220 mg Per Tube Daily   acetaminophen, labetalol, midazolam, morphine injection, ondansetron **OR** ondansetron (ZOFRAN) IV, vecuronium  Assessment/ Plan:  60 y.o. female with  hypertension, anxiety, anemia who was brought to the ER on December 16 with a chief complaint of cough shortness of breath   admitted on 09/22/2020 for Edema [R60.9] Lactic acid acidosis [E87.2] Acute respiratory failure with hypoxia ([place]) [J96.01] Pneumonia due to COVID-19 virus [U07.1, J12.82]   # Acute Kidney injury with proteinuria AKI likely secondary to ATN and iv contrast exposure (12/172021) 12/31 0701 - 01/01 0700 In: 2002.4 [I.V.:1085.1; NG/GT:817.4; IV Piggyback:100] Out: 2325 [Urine:2325]   Lab Results  Component Value Date   CREATININE 1.85 (H) 10/10/2020   CREATININE 2.13 (H) 10/09/2020   CREATININE 2.24 (H)  10/08/2020    Renal function improved.  Creatinine down to 1.8.  Good urine output at 2.3 L.  Continue to monitor renal parameters.  No indication for dialysis.  #Hypernatremia Due to brisk diuresis -Serum sodium now normalized at 142.  Continue periodically monitor.  # Covid 19 pneumonia/acute respiratory failure Complicated by Acute resp failure with hypoxia Small pulmonary embolism right upper lobe pulmonary arteries in a distal pulmonary artery- requiring iv anticoagulation -Still requiring significant oxygen on the ventilator at 50% FiO2.  Continue weaning efforts.  # hyperphosphatemia Lab Results  Component Value Date   PTH 109 (H) 09/29/2020  CALCIUM 8.6 (L) 10/10/2020   PHOS 5.5 (H) 10/10/2020    Phosphorus a bit higher at 5.5.  Hold off on binders.  #Hyperkalemia. Potassium slightly high at 5.5.  May need to consider adding Veltassa or Lokelma if potassium remains high.   LOS: 16 Margaret Shepard [place] 1/1/202212:10 PM  [place], [place]  Note: This note was prepared with Dragon dictation. Any transcription errors are unintentional

## 2020-10-10 NOTE — TOC Progression Note (Signed)
Transition of Care [facility]) - Progression Note    Patient Details  Name: Margaret Shepard MRN: [id number] Date of Birth: 1961-04-29  Transition of Care [facility]) CM/SW Contact  Bing Quarry, RN Phone Number: 10/10/2020, 12:39 PM  Clinical Narrative:    10/10/20 1239 pm. Provider notes indicate patient condition still requiring ventilator at 50% FiO2. Condition critical. Will continue to monitor. Gabriel Cirri RN CM>         Expected Discharge Plan and Services                                                 Social Determinants of Health (SDOH) Interventions    Readmission Risk Interventions No flowsheet data found.

## 2020-10-10 DEATH — deceased

## 2020-10-11 DIAGNOSIS — U071 COVID-19: Secondary | ICD-10-CM | POA: Diagnosis not present

## 2020-10-11 DIAGNOSIS — J1282 Pneumonia due to coronavirus disease 2019: Secondary | ICD-10-CM | POA: Diagnosis not present

## 2020-10-11 LAB — BASIC METABOLIC PANEL
Anion gap: 10 (ref 5–15)
BUN: 81 mg/dL — ABNORMAL HIGH (ref 6–20)
CO2: 33 mmol/L — ABNORMAL HIGH (ref 22–32)
Calcium: 8.6 mg/dL — ABNORMAL LOW (ref 8.9–10.3)
Chloride: 102 mmol/L (ref 98–111)
Creatinine, Ser: 1.77 mg/dL — ABNORMAL HIGH (ref 0.44–1.00)
GFR, Estimated: 33 mL/min — ABNORMAL LOW (ref >60)
Glucose, Bld: 120 mg/dL — ABNORMAL HIGH (ref 70–99)
Potassium: 5.3 mmol/L — ABNORMAL HIGH (ref 3.5–5.1)
Sodium: 145 mmol/L (ref 135–145)

## 2020-10-11 LAB — MAGNESIUM: Magnesium: 2.1 mg/dL (ref 1.7–2.4)

## 2020-10-11 LAB — RENAL FUNCTION PANEL
Albumin: 2.4 g/dL — ABNORMAL LOW (ref 3.5–5.0)
Anion gap: 11 (ref 5–15)
BUN: 77 mg/dL — ABNORMAL HIGH (ref 6–20)
CO2: 32 mmol/L (ref 22–32)
Calcium: 8.5 mg/dL — ABNORMAL LOW (ref 8.9–10.3)
Chloride: 101 mmol/L (ref 98–111)
Creatinine, Ser: 1.77 mg/dL — ABNORMAL HIGH (ref 0.44–1.00)
GFR, Estimated: 33 mL/min — ABNORMAL LOW (ref >60)
Glucose, Bld: 116 mg/dL — ABNORMAL HIGH (ref 70–99)
Phosphorus: 3.4 mg/dL (ref 2.5–4.6)
Potassium: 5.3 mmol/L — ABNORMAL HIGH (ref 3.5–5.1)
Sodium: 144 mmol/L (ref 135–145)

## 2020-10-11 LAB — BLOOD GAS, ARTERIAL
Acid-Base Excess: 7.7 mmol/L — ABNORMAL HIGH (ref 0.0–2.0)
Bicarbonate: 34.5 mmol/L — ABNORMAL HIGH (ref 20.0–28.0)
FIO2: 0.5
MECHVT: 450 mL
O2 Saturation: 90 %
PIP: 5 cmH2O
Patient temperature: 37
RATE: 30 resp/min
pCO2 arterial: 61 mmHg — ABNORMAL HIGH (ref 32.0–48.0)
pH, Arterial: 7.36 (ref 7.350–7.450)
pO2, Arterial: 61 mmHg — ABNORMAL LOW (ref 83.0–108.0)

## 2020-10-11 LAB — GLUCOSE, CAPILLARY
Glucose-Capillary: 126 mg/dL — ABNORMAL HIGH (ref 70–99)
Glucose-Capillary: 128 mg/dL — ABNORMAL HIGH (ref 70–99)
Glucose-Capillary: 152 mg/dL — ABNORMAL HIGH (ref 70–99)
Glucose-Capillary: 211 mg/dL — ABNORMAL HIGH (ref 70–99)
Glucose-Capillary: 233 mg/dL — ABNORMAL HIGH (ref 70–99)
Glucose-Capillary: 251 mg/dL — ABNORMAL HIGH (ref 70–99)
Glucose-Capillary: 96 mg/dL (ref 70–99)

## 2020-10-11 LAB — CBC
HCT: 26.5 % — ABNORMAL LOW (ref 36.0–46.0)
Hemoglobin: 8.4 g/dL — ABNORMAL LOW (ref 12.0–15.0)
MCH: 29.4 pg (ref 26.0–34.0)
MCHC: 31.7 g/dL (ref 30.0–36.0)
MCV: 92.7 fL (ref 80.0–100.0)
Platelets: 172 10*3/uL (ref 150–400)
RBC: 2.86 MIL/uL — ABNORMAL LOW (ref 3.87–5.11)
RDW: 14.9 % (ref 11.5–15.5)
WBC: 14.5 10*3/uL — ABNORMAL HIGH (ref 4.0–10.5)
nRBC: 0 % (ref 0.0–0.2)

## 2020-10-11 MED ORDER — INSULIN ASPART 100 UNIT/ML ~~LOC~~ SOLN
0.0000 [IU] | SUBCUTANEOUS | Status: DC
  Administered 2020-10-11: 21:00:00 7 [IU] via SUBCUTANEOUS
  Administered 2020-10-11: 3 [IU] via SUBCUTANEOUS
  Administered 2020-10-11: 11 [IU] via SUBCUTANEOUS
  Administered 2020-10-12: 12:00:00 3 [IU] via SUBCUTANEOUS
  Administered 2020-10-12 (×2): 4 [IU] via SUBCUTANEOUS
  Administered 2020-10-12 – 2020-10-13 (×5): 3 [IU] via SUBCUTANEOUS
  Administered 2020-10-13 (×2): 4 [IU] via SUBCUTANEOUS
  Administered 2020-10-14 (×5): 3 [IU] via SUBCUTANEOUS
  Administered 2020-10-14: 17:00:00 4 [IU] via SUBCUTANEOUS
  Administered 2020-10-15 – 2020-10-16 (×4): 3 [IU] via SUBCUTANEOUS
  Administered 2020-10-16: 4 [IU] via SUBCUTANEOUS
  Administered 2020-10-16 (×2): 3 [IU] via SUBCUTANEOUS
  Administered 2020-10-17: 4 [IU] via SUBCUTANEOUS
  Administered 2020-10-17 – 2020-10-18 (×3): 3 [IU] via SUBCUTANEOUS
  Administered 2020-10-18 (×2): 4 [IU] via SUBCUTANEOUS
  Filled 2020-10-11 (×32): qty 1

## 2020-10-11 MED ORDER — INSULIN DETEMIR 100 UNIT/ML ~~LOC~~ SOLN
10.0000 [IU] | Freq: Two times a day (BID) | SUBCUTANEOUS | Status: DC
  Administered 2020-10-11 – 2020-10-18 (×14): 10 [IU] via SUBCUTANEOUS
  Filled 2020-10-11 (×16): qty 0.1

## 2020-10-11 NOTE — Progress Notes (Signed)
Recruitment maneuver completed, pt tolerated well

## 2020-10-11 NOTE — Progress Notes (Signed)
[place], [place] 10/11/20  Subjective:   Hospital day # 17  Patient remains critically ill. Has been difficult to wean from the ventilator. Tracheostomy being considered. Good urine output noted. Mild hyperkalemia noted.  Renal: 01/01 0701 - 01/02 0700 In: 2290 [I.V.:972.4; NG/GT:1217.7; IV Piggyback:100] Out: 2775 [Urine:2775] Lab Results  Component Value Date   CREATININE 1.77 (H) 10/11/2020   CREATININE 1.77 (H) 10/11/2020   CREATININE 1.85 (H) 10/10/2020     Objective:  Vital signs in last 24 hours:  Temp:  [96.98 F (36.1 C)-98.42 F (36.9 C)] 97.34 F (36.3 C) (01/02 1200) Pulse Rate:  [81-105] 88 (01/02 1200) Resp:  [12-34] 18 (01/02 1200) BP: (102-154)/(57-88) 102/61 (01/02 1200) SpO2:  [89 %-99 %] 91 % (01/02 1200) FiO2 (%):  [40 %-50 %] 50 % (01/02 0955) Weight:  [66.9 kg] 66.9 kg (01/02 0500)  Weight change: 0 kg Filed Weights   10/09/20 0419 10/10/20 0500 10/11/20 0500  Weight: 66.9 kg 66.9 kg 66.9 kg    Intake/Output:    Intake/Output Summary (Last 24 hours) at 10/11/2020 1404 Last data filed at 10/11/2020 0800 Gross per 24 hour  Intake 1202.99 ml  Output 1950 ml  Net -747.01 ml    Physical Exam: General:  Critically ill-appearing  HEENT  ETT in place, OGT  Pulm/lungs  vent assisted FiO2 50%, respiratory rate 22  CVS/Heart  slightly tachycardic  Abdomen:   Nondistended  Extremities:  + peripheral edema  Neurologic:  sedated  Skin:  No acute rashes  Foley in place    Basic Metabolic Panel:  Recent Labs  Lab 10/06/20 0500 10/07/20 1000 10/08/20 0422 10/09/20 0430 10/10/20 0457 10/11/20 0143  NA  --  142 142 146* 142 144  145  K  --  4.2 4.3 4.5 5.5* 5.3*  5.3*  CL  --  105 104 104 99 101  102  CO2  --  30 29 32 32 32  33*  GLUCOSE  --  121* 130* 142* 157* 116*  120*  BUN  --  77* 78* 75* 79* 77*  81*  CREATININE  --  2.16* 2.24* 2.13* 1.85* 1.77*  1.77*  CALCIUM  --  8.3* 8.6* 8.8* 8.6*  8.5*  8.6*  MG 1.9  --  1.8 2.2 2.3 2.1  PHOS  --  4.0 4.3 4.4 5.5* 3.4     CBC: Recent Labs  Lab 10/07/20 0332 10/08/20 0422 10/09/20 0430 10/10/20 0457 10/11/20 0143  WBC 11.0* 11.1* 11.9* 12.2* 14.5*  HGB 8.5* 8.8* 8.9* 8.7* 8.4*  HCT 26.8* 26.7* 26.5* 28.6* 26.5*  MCV 89.9 89.0 88.6 94.7 92.7  PLT 200 211 181 176 172      Lab Results  Component Value Date   HEPBSAG NON REACTIVE 09/27/2020      Microbiology:  No results found for this or any previous visit (from the past 240 hour(s)).  Coagulation Studies: No results for input(s): LABPROT, INR in the last 72 hours.  Urinalysis: No results for input(s): COLORURINE, LABSPEC, PHURINE, GLUCOSEU, HGBUR, BILIRUBINUR, KETONESUR, PROTEINUR, UROBILINOGEN, NITRITE, LEUKOCYTESUR in the last 72 hours.  Invalid input(s): APPERANCEUR    Imaging: DG Chest Port 1 View  Result Date: 10/10/2020 CLINICAL DATA:  Acute respiratory failure with hypoxia EXAM: PORTABLE CHEST 1 VIEW COMPARISON:  10/04/2020 chest radiograph. FINDINGS: Endotracheal tube tip is 5.2 cm above the carina. Enteric tube enters stomach with the tip not seen on this image. Left internal jugular central venous catheter terminates in  the upper third of the SVC. Stable cardiomediastinal silhouette with normal heart size. No pneumothorax. No pleural effusion. Patchy opacities throughout the left greater than right lungs, similar to minimally improved. IMPRESSION: 1. Well-positioned support structures. 2. Patchy opacities throughout the left greater than right lungs, similar to minimally improved, compatible with multilobar pneumonia. Electronically Signed   By: Ilona Sorrel M.D.   On: 10/10/2020 07:36     Medications:   . sodium chloride 5 mL/hr at 10/11/20 0642  . dexmedetomidine (PRECEDEX) IV infusion 1.2 mcg/kg/hr (10/11/20 6759)  . feeding supplement (VITAL 1.5 CAL) 1,000 mL (10/10/20 2311)  . HYDROmorphone 4 mg/hr (10/11/20 0616)  . midazolam 6 mg/hr  (10/11/20 1638)   . vitamin C  500 mg Per Tube Daily  . chlorhexidine gluconate (MEDLINE KIT)  15 mL Mouth Rinse BID  . Chlorhexidine Gluconate Cloth  6 each Topical Q0600  . clonazePAM  2 mg Oral BID  . enoxaparin (LOVENOX) injection  1 mg/kg Subcutaneous Q12H  . famotidine  20 mg Oral Daily  . feeding supplement (PROSource TF)  45 mL Per Tube Daily  . folic acid  1 mg Per Tube Daily  . free water  200 mL Per Tube Q6H  . insulin aspart  0-15 Units Subcutaneous Q4H  . insulin detemir  5 Units Subcutaneous BID  . mouth rinse  15 mL Mouth Rinse 10 times per day  . metoprolol tartrate  25 mg Oral BID  . multivitamin with minerals  1 tablet Per Tube Daily  . oxyCODONE  15 mg Oral Q6H  . patiromer  8.4 g Oral Daily  . predniSONE  30 mg Oral Q breakfast   Followed by  . [START ON 10/16/2020] predniSONE  20 mg Oral Q breakfast   Followed by  . [START ON 10-23-2020] predniSONE  10 mg Oral Q breakfast   Followed by  . [START ON 10/26/2020] predniSONE  5 mg Oral Q breakfast  . QUEtiapine  50 mg Oral BID  . thiamine  100 mg Per Tube Daily  . zinc sulfate  220 mg Per Tube Daily   acetaminophen, labetalol, midazolam, morphine injection, ondansetron **OR** ondansetron (ZOFRAN) IV, vecuronium  Assessment/ Plan:  60 y.o. female with  hypertension, anxiety, anemia who was brought to the ER on December 16 with a chief complaint of cough shortness of breath   admitted on 09/19/2020 for Edema [R60.9] Lactic acid acidosis [E87.2] Acute respiratory failure with hypoxia (HCC) [J96.01] Pneumonia due to COVID-19 virus [U07.1, J12.82]   # Acute Kidney injury with proteinuria AKI likely secondary to ATN and iv contrast exposure (12/172021) 01/01 0701 - 01/02 0700 In: 2290 [I.V.:972.4; NG/GT:1217.7; IV Piggyback:100] Out: 2775 [Urine:2775]   Lab Results  Component Value Date   CREATININE 1.77 (H) 10/11/2020   CREATININE 1.77 (H) 10/11/2020   CREATININE 1.85 (H) 10/10/2020    Renal function stable  with a creatinine of 1.7 with acceptable urine output.  No indication for dialysis.  Discussed with Dr. Patsey Berthold.  Okay to remove temporary dialysis catheter but leave to their discretion.  #Hypernatremia Due to brisk diuresis -Sodium normal at 144.  Continue to monitor.  # Covid 19 pneumonia/acute respiratory failure Complicated by Acute resp failure with hypoxia Small pulmonary embolism right upper lobe pulmonary arteries in a distal pulmonary artery- requiring iv anticoagulation -Case discussed with Dr. Patsey Berthold.  Difficult to wean from the ventilator.  She will likely end up requiring tracheostomy soon.  # hyperphosphatemia Lab Results  Component Value Date  PTH 109 (H) 09/29/2020   CALCIUM 8.5 (L) 10/11/2020   CALCIUM 8.6 (L) 10/11/2020   PHOS 3.4 10/11/2020    Phosphorus at target at 3.4.  Continue to monitor.  #Hyperkalemia. Potassium down to 5.3.  Continue [place] 8.4 g daily.   LOS: 17 Aishia Barkey 1/2/20222:04 PM  [place], [place]  Note: This note was prepared with Dragon dictation. Any transcription errors are unintentional

## 2020-10-11 NOTE — Progress Notes (Signed)
NAME:  Margaret Shepard, MRN:  [id number], DOB:  12/02/1960, LOS: 67 ADMISSION DATE:  09/20/2020, CONSULTATION DATE: 10/04/2020 REFERRING MD: Dr. Tamala Julian, CHIEF COMPLAINT: Shortness of Breath   Brief History:  60 yo female admitted with acute hypoxic respiratory failure secondary to pneumonia due to COVID-19 requiring HHFNC and NRB now intubated and severe resp failure complicated by DVT and PE  History of Present Illness:  This is a 60 yo unvaccinated female who presented to [facility] ER on 12/16 via EMS with c/o worsening shortness of breath, cough, nausea, diarrhea, and poor po intake.  Per ER notes pt tested positive for COVID-19 8 days prior to ER presentation.  EMS reported pt severely hypoxic with O2 sats less than 50% on RA.  Therefore she was placed on 15L NRB with O2 sats increasing to 81%.  Upon arrival to the ER pt transitioned to [facility] and NRB due to continued hypoxia and tachypnea.  Lab results revealed CO2 19, glucose 174, anon gap 18, LDH 646, troponin 260, ferritin 478, pct <0.10, lactic acid 4.9, BNP 294, and vbg pH 7.41/pCO2 37/bicarb 23.5.  COVID-19 positive and CXR concerning for multifocal pneumonia.  There was also concern of possible pulmonary embolism, however due to severe hypoxia pt unable to tolerate transfer for CTA Chest.  Therefore, heparin gtt initiated.  Pt also received aspirin, decadron, ibuprofen, zofran, and remdesivir.  She was subsequently admitted to ICU per PCCM team for additional workup and treatment, but remained in the ER pending ICU bed availability.   Significant Hospital Events:  12/16: Pt admitted to ICU with COVID-19 pneumonia requiring HHFNC and NRB remained in the ER pending ICU bed availability  12/17 high risk for cardiac arrest, on pressors 12/18 Off pressors 12/19 worsening renal function.  Nephrology consult 09/28/20- patient was noted to be severely acidemic with HAGMA in am s/p 2 amp bicarb with repeat ABG significantly improved 12/21- patient  will require HD, will place vascath today. Meds reviewed and changed, TG elevated on propofol. DCd IVF. 12/22-  Patient remains critically ill. Reviewed plan with nephrologist, patient will not have HD today. 10/01/20- patient on 35%FIo2 8peep, she diruesed >3L we will hold HD, continue diuresis. SBT.  10/02/20- patient with tachyarrythmia overnight.  S/p cardiology evaluation. For SBT today.  10/03/20- patient with no overnight events, remains critically ill on MV.  Diuresed well overnight.  Discussed care plan with nephrologist today.  10/04/20-  Patient remains critically ill.  She had SBT daily and has not been able to pass yet.  10/05/20-with no acute events overnight remains on PEEP of 8 and FiO2 of 55% 10/06/20-Patient on peep of 8 and FiO2 60%. Trying to weansedation but very difficult in spite of using oral medications 10/07/20-patient tolerated 5/5 trial for over 30 minutes.  She was able to follow commands today.  She was able to wiggle her right toes.  She was able to stick out her tongue. 10/08/2020- Lightly sedated, able to follow some simple commands.  Currently on 50% FiO2 & 8 PEEP, increased work of breathing on vent.  Consult ENT for Trach. 10/09/2020- Pt very asynchronous with vent, increasing sedation with prn Paralytics.  Begin Recruitment maneuvers and Diurese today;  ENT evaluated pt, tentative plan for trach some time next week 10/10/2020-switch to [facility], adjusted ventilator settings 10/11/2020-ongoing issues with ventilator asynchrony no matter what mode utilized.  Requiring intermittent paralytics.  Consults:  [place]  [facility]  Procedures:  12/17: Endotracheal intubation 12/21: Left IJ Trialysis catheter inserted  Significant Diagnostic Tests:  12/16: CXR revealed multifocal pneumonia  12/17: Venous US Bilateral LE>>Positive for BILATERAL lower extremity DVT, more extensive on the Left. 12/17: CTA Chest>>Small pulmonary embolism right upper lobe.Diffuse  bilateral airspace disease compatible with COVID pneumonia. Bibasilar atelectasis. 12/19: Renal US>>1. Increased echogenicity of bilateral kidneys as can be seen in medical renal disease. No hydronephrosis.  Micro Data:  COVID-19 12/16>>positive  Influenza PCR 12/16>>negative  Urine 12/16>> no growth Blood x2 12/16>>STAPHYLOCOCCUS HOMINIS  Blood 12/15>> no growth MRSA PCR 12/17>>negative  Antimicrobials:  Remdesivir 12/16>>12/20 Azithromycin 12/16>>12/17 Rocephin 12/16>>12/17 Vancomycin 12/18>>12/19 Cefepime 12/26 (plan for 5 days)>>  Interim History / Subjective:  No events reported overnight Afebrile Very asynchronous with vent Sedated with Dilaudid, versed, precedex No vasopressors, actually hypertensive with agitation  Objective   Blood pressure 136/88, pulse 92, temperature 98.42 F (36.9 C), resp. rate (!) 34, height '5\' 4"'  (1.626 m), weight 66.9 kg, SpO2 92 %.    Vent Mode: PRVC FiO2 (%):  [40 %-50 %] 50 % Set Rate:  [26 bmp-30 bmp] 30 bmp Vt Set:  [450 mL] 450 mL PEEP:  [5 cmH20-8 cmH20] 8 cmH20 Plateau Pressure:  [10 cmH20-31 cmH20] 31 cmH20   Intake/Output Summary (Last 24 hours) at 10/11/2020 1042 Last data filed at 10/11/2020 0800 Gross per 24 hour  Intake 2290.01 ml  Output 2600 ml  Net -309.99 ml   Filed Weights   10/09/20 0419 10/10/20 0500 10/11/20 0500  Weight: 66.9 kg 66.9 kg 66.9 kg    Examination: General: Critically ill-appearing female,intubated and sedated, synchronous with the ventilator  HEENT: Atraumatic, normocephalic, neck supple, no JVD Lungs: Coarse rhonchi throughout, no wheezing, asynchronous with ventilator accessory muscle use Cardiovascular: Tachycardia, regular rhythm, S1-S2, no murmurs, rubs, gallops, 2+ distal pulses Abdomen: Soft, nontender, nondistended, no guarding or rebound tenderness, bowel sounds positive x4 Extremities: No deformities, no edema, normal bulk and tone Neuro: Sedated, withdraws from pain, pupils  PERRLA Skin: Warm and dry.  No obvious rashes, lesions, ulceration  Assessment & Plan:   Acute hypoxic respiratory failure secondary to COVID-19 pneumonia with ARDS, and small Right Upper Lobe PE -Continue mechanical ventilation ventilator adjustments made -Discussed with RT during rounds -Wean PEEP and FiO2 as able to maintain O2 sats >88% -Goal plateau pressure less than 30, driving pressure less than 15 -Paralytics if necessary for vent synchrony, gas exchange -Deep sedation per PAD protocol, goal RASS -4, currently Dilaudid, versed, precedex -Diuresis as blood pressure and renal function can tolerate -VAP prevention order set -Completed course of Remdesivir and Barcinitib -Steroids>> On 30 mg of Prednisone undergoing taper -Vitamin C, zinc -Continue recruitment maneuvers -Pt unable to tolerate weaning trials, ENT consulted for Trach -Patient's prognosis overall is poor obtain palliative care consultation   Small Right Upper Lobe PE Bilateral LE DVT's -Transitioned to Lovenox therapeutic dose to assist with decreasing volume intake   Acute toxic metabolic encephalopathy, need for sedation -Increase RASS goal to -3 to -4 as she is dyssynchronous with the vent -Intermittent paralytics -Continue Dilaudid, Versed, Precedex -Increasing Klonopin to assist with weaning sedation                                                               -Daily Wake up assessment -Provide supportive care   AKI -I&O's /  urinary output -Follow BMP -Ensure adequate renal perfusion -Avoid nephrotoxic agents as able -Replace electrolytes as indicated -Nephrology following, appreciate input -PICC line in the a.m., discontinue dialysis catheter   Anemia without s/sx of Bleeding -Monitor for S/Sx of bleeding -Trend CBC -Lovenox treatment dose anticoagulation/VTE Prophylaxis -Transfuse for Hgb <7   Hyperglycemia -CBG's -SSI -Follow ICU Hypo/Hyperglycemia protocol      Best practice  (evaluated daily)  Diet: Tube Feeds, Dietician following Pain/Anxiety/Delirium protocol (if indicated): Dilaudid, Versed, Precedex VAP protocol (if indicated): yes DVT prophylaxis: Lovenox, therapeutic GI prophylaxis: Protonix Glucose control: SSI  Mobility: Bedrest  Disposition: ICU   Goals of Care:  Last date of multidisciplinary goals of care discussion: 10/09/2020 Family and staff present: Updated pt's aunt Alver Sorrow via telephone 10/11/2020 Summary of discussion: Plan for trach.  However may need to reevaluate goals of care Follow up goals of care discussion due: 10/11/2020, palliative care consultation obtained Code Status: Limited Code (NO CPR, Defib, ACLS meds)  Labs   CBC: Recent Labs  Lab 10/07/20 0332 10/08/20 0422 10/09/20 0430 10/10/20 0457 10/11/20 0143  WBC 11.0* 11.1* 11.9* 12.2* 14.5*  HGB 8.5* 8.8* 8.9* 8.7* 8.4*  HCT 26.8* 26.7* 26.5* 28.6* 26.5*  MCV 89.9 89.0 88.6 94.7 92.7  PLT 200 211 181 176 465    Basic Metabolic Panel: Recent Labs  Lab 10/06/20 0500 10/07/20 1000 10/08/20 0422 10/09/20 0430 10/10/20 0457 10/11/20 0143  NA  --  142 142 146* 142 144  145  K  --  4.2 4.3 4.5 5.5* 5.3*  5.3*  CL  --  105 104 104 99 101  102  CO2  --  30 29 32 32 32  33*  GLUCOSE  --  121* 130* 142* 157* 116*  120*  BUN  --  77* 78* 75* 79* 77*  81*  CREATININE  --  2.16* 2.24* 2.13* 1.85* 1.77*  1.77*  CALCIUM  --  8.3* 8.6* 8.8* 8.6* 8.5*  8.6*  MG 1.9  --  1.8 2.2 2.3 2.1  PHOS  --  4.0 4.3 4.4 5.5* 3.4   GFR: Estimated Creatinine Clearance: 31.8 mL/min (A) (by C-G formula based on SCr of 1.77 mg/dL (H)). Recent Labs  Lab 10/05/20 0451 10/06/20 0500 10/07/20 0332 10/08/20 0422 10/09/20 0430 10/10/20 0457 10/11/20 0143  PROCALCITON 0.13 0.13  --   --   --   --   --   WBC 13.0* 11.8*   < > 11.1* 11.9* 12.2* 14.5*   < > = values in this interval not displayed.    Liver Function Tests: Recent Labs  Lab 10/07/20 1000 10/08/20 0422  10/09/20 0430 10/10/20 0457 10/11/20 0143  ALBUMIN 2.0* 2.2* 2.3* 2.3* 2.4*   No results for input(s): LIPASE, AMYLASE in the last 168 hours. No results for input(s): AMMONIA in the last 168 hours.  ABG    Component Value Date/Time   PHART 7.36 10/11/2020 0500   PCO2ART 61 (H) 10/11/2020 0500   PO2ART 61 (L) 10/11/2020 0500   HCO3 34.5 (H) 10/11/2020 0500   ACIDBASEDEF 1.8 10/02/2020 0442   O2SAT 90.0 10/11/2020 0500     Coagulation Profile: No results for input(s): INR, PROTIME in the last 168 hours.  Cardiac Enzymes: No results for input(s): CKTOTAL, CKMB, CKMBINDEX, TROPONINI in the last 168 hours.  HbA1C: Hgb A1c MFr Bld  Date/Time Value Ref Range Status  09/25/2020 06:21 AM 6.6 (H) 4.8 - 5.6 % Final    Comment:    (  NOTE) Pre diabetes:          5.7%-6.4%  Diabetes:              >6.4%  Glycemic control for   <7.0% adults with diabetes     CBG: Recent Labs  Lab 10/10/20 1650 10/10/20 2036 10/11/20 0102 10/11/20 0353 10/11/20 0728  GLUCAP 220* 212* 96 128* 152*    Review of Systems:   Unable to assess due to Critical illness, intubation and sedation  Allergies Allergies  Allergen Reactions  . Hydrocodone-Acetaminophen Nausea And Vomiting and Itching    Scheduled Meds: . vitamin C  500 mg Per Tube Daily  . chlorhexidine gluconate (MEDLINE KIT)  15 mL Mouth Rinse BID  . Chlorhexidine Gluconate Cloth  6 each Topical Q0600  . clonazePAM  2 mg Oral BID  . enoxaparin (LOVENOX) injection  1 mg/kg Subcutaneous Q12H  . famotidine  20 mg Oral Daily  . feeding supplement (PROSource TF)  45 mL Per Tube Daily  . folic acid  1 mg Per Tube Daily  . free water  200 mL Per Tube Q6H  . insulin aspart  0-15 Units Subcutaneous Q4H  . insulin detemir  5 Units Subcutaneous BID  . mouth rinse  15 mL Mouth Rinse 10 times per day  . metoprolol tartrate  25 mg Oral BID  . multivitamin with minerals  1 tablet Per Tube Daily  . oxyCODONE  15 mg Oral Q6H  . patiromer   8.4 g Oral Daily  . predniSONE  30 mg Oral Q breakfast   Followed by  . [START ON 10/16/2020] predniSONE  20 mg Oral Q breakfast   Followed by  . [START ON 10/31/20] predniSONE  10 mg Oral Q breakfast   Followed by  . [START ON 10/26/2020] predniSONE  5 mg Oral Q breakfast  . QUEtiapine  50 mg Oral BID  . thiamine  100 mg Per Tube Daily  . zinc sulfate  220 mg Per Tube Daily   Continuous Infusions: . sodium chloride 5 mL/hr at 10/11/20 0642  . dexmedetomidine (PRECEDEX) IV infusion 1.2 mcg/kg/hr (10/11/20 3212)  . feeding supplement (VITAL 1.5 CAL) 1,000 mL (10/10/20 2311)  . HYDROmorphone 4 mg/hr (10/11/20 0616)  . midazolam 6 mg/hr (10/11/20 0642)   PRN Meds:.acetaminophen, labetalol, midazolam, morphine injection, ondansetron **OR** ondansetron (ZOFRAN) IV, vecuronium     Critical care time: 40 minutes    C. Derrill Kay, MD [facility]   *This note was dictated using voice recognition software/Dragon.  Despite best efforts to proofread, errors can occur which can change the meaning.  Any change was purely unintentional.

## 2020-10-12 ENCOUNTER — Inpatient Hospital Stay: Payer: Medicaid Other

## 2020-10-12 LAB — CBC
HCT: 23.9 % — ABNORMAL LOW (ref 36.0–46.0)
Hemoglobin: 7.4 g/dL — ABNORMAL LOW (ref 12.0–15.0)
MCH: 29 pg (ref 26.0–34.0)
MCHC: 31 g/dL (ref 30.0–36.0)
MCV: 93.7 fL (ref 80.0–100.0)
Platelets: 142 10*3/uL — ABNORMAL LOW (ref 150–400)
RBC: 2.55 MIL/uL — ABNORMAL LOW (ref 3.87–5.11)
RDW: 15 % (ref 11.5–15.5)
WBC: 12.6 10*3/uL — ABNORMAL HIGH (ref 4.0–10.5)
nRBC: 0 % (ref 0.0–0.2)

## 2020-10-12 LAB — RENAL FUNCTION PANEL
Albumin: 2 g/dL — ABNORMAL LOW (ref 3.5–5.0)
Anion gap: 7 (ref 5–15)
BUN: 79 mg/dL — ABNORMAL HIGH (ref 6–20)
CO2: 32 mmol/L (ref 22–32)
Calcium: 8.3 mg/dL — ABNORMAL LOW (ref 8.9–10.3)
Chloride: 103 mmol/L (ref 98–111)
Creatinine, Ser: 1.75 mg/dL — ABNORMAL HIGH (ref 0.44–1.00)
GFR, Estimated: 33 mL/min — ABNORMAL LOW (ref >60)
Glucose, Bld: 148 mg/dL — ABNORMAL HIGH (ref 70–99)
Phosphorus: 3.7 mg/dL (ref 2.5–4.6)
Potassium: 5 mmol/L (ref 3.5–5.1)
Sodium: 142 mmol/L (ref 135–145)

## 2020-10-12 LAB — GLUCOSE, CAPILLARY
Glucose-Capillary: 140 mg/dL — ABNORMAL HIGH (ref 70–99)
Glucose-Capillary: 166 mg/dL — ABNORMAL HIGH (ref 70–99)
Glucose-Capillary: 132 mg/dL — ABNORMAL HIGH (ref 70–99)
Glucose-Capillary: 199 mg/dL — ABNORMAL HIGH (ref 70–99)
Glucose-Capillary: 150 mg/dL — ABNORMAL HIGH (ref 70–99)

## 2020-10-12 LAB — MAGNESIUM: Magnesium: 1.9 mg/dL (ref 1.7–2.4)

## 2020-10-12 LAB — PROCALCITONIN: Procalcitonin: 0.87 ng/mL

## 2020-10-12 MED ORDER — SODIUM CHLORIDE 0.9% FLUSH: 10.0000 mL | INTRAVENOUS | Status: DC | PRN

## 2020-10-12 MED ORDER — SODIUM CHLORIDE 0.9% FLUSH
10.0000 mL | Freq: Two times a day (BID) | INTRAVENOUS | Status: DC
  Administered 2020-10-12 – 2020-10-19 (×14): 10 mL

## 2020-10-12 NOTE — Progress Notes (Signed)
[facility]  ROUNDING NOTE   Subjective:   Tmax 100.9  UOP 2160m  On hydromorphone gtt.   Objective:  Vital signs in last 24 hours:  Temp:  [96.8 F (36 C)-100.94 F (38.3 C)] 97.7 F (36.5 C) (01/03 0700) Pulse Rate:  [86-102] 86 (01/03 0700) Resp:  [16-32] 21 (01/03 0700) BP: (85-165)/(54-83) 96/64 (01/03 0700) SpO2:  [91 %-100 %] 93 % (01/03 0700) FiO2 (%):  [50 %-60 %] 60 % (01/03 0906) Weight:  [66.9 kg] 66.9 kg (01/03 0500)  Weight change: 0 kg Filed Weights   10/10/20 0500 10/11/20 0500 10/12/20 0500  Weight: 66.9 kg 66.9 kg 66.9 kg    Intake/Output: I/O last 3 completed shifts: In: 2801.8 [I.V.:1491.8; NG/GT:1210; IV Piggyback:100] Out: 37353[Urine:3325]   Intake/Output this shift:  Total I/O In: 33 [I.V.:33] Out: -   Physical Exam: General: Critically il   Head: ETT   Eyes: Pupils do not seem equal  Neck:  trachea midline  Lungs:  PRVC FiO2 60%  Heart: Regular rate and rhythm  Abdomen:  Soft, obese  Extremities:  no peripheral edema.  Neurologic: Intubated, sedated  Skin: No lesions  Access: none    Basic Metabolic Panel: Recent Labs  Lab 10/08/20 0422 10/09/20 0430 10/10/20 0457 10/11/20 0143 10/12/20 0632  NA 142 146* 142 144  145 142  K 4.3 4.5 5.5* 5.3*  5.3* 5.0  CL 104 104 99 101  102 103  CO2 29 32 32 32  33* 32  GLUCOSE 130* 142* 157* 116*  120* 148*  BUN 78* 75* 79* 77*  81* 79*  CREATININE 2.24* 2.13* 1.85* 1.77*  1.77* 1.75*  CALCIUM 8.6* 8.8* 8.6* 8.5*  8.6* 8.3*  MG 1.8 2.2 2.3 2.1 1.9  PHOS 4.3 4.4 5.5* 3.4 3.7    Liver Function Tests: Recent Labs  Lab 10/08/20 0422 10/09/20 0430 10/10/20 0457 10/11/20 0143 10/12/20 0632  ALBUMIN 2.2* 2.3* 2.3* 2.4* 2.0*   No results for input(s): LIPASE, AMYLASE in the last 168 hours. No results for input(s): AMMONIA in the last 168 hours.  CBC: Recent Labs  Lab 10/08/20 0422 10/09/20 0430 10/10/20 0457 10/11/20 0143 10/12/20 0632  WBC 11.1*  11.9* 12.2* 14.5* 12.6*  HGB 8.8* 8.9* 8.7* 8.4* 7.4*  HCT 26.7* 26.5* 28.6* 26.5* 23.9*  MCV 89.0 88.6 94.7 92.7 93.7  PLT 211 181 176 172 142*    Cardiac Enzymes: No results for input(s): CKTOTAL, CKMB, CKMBINDEX, TROPONINI in the last 168 hours.  BNP: Invalid input(s): POCBNP  CBG: Recent Labs  Lab 10/11/20 1532 10/11/20 1949 10/11/20 2346 10/12/20 0531 10/12/20 0828  GLUCAP 251* 211* 126* 140* 166*    Microbiology: Results for orders placed or performed during the hospital encounter of 10/09/2020  Blood Culture (routine x 2)     Status: Abnormal   Collection Time: 10/03/2020  7:39 PM   Specimen: BLOOD  Result Value Ref Range Status   Specimen Description   Final    BLOOD LEFT ANTECUBITAL [facility] [address] [place] [address]   Special Requests   Final    BOTTLES DRAWN AEROBIC AND ANAEROBIC Blood Culture adequate volume [facility] [address] [place] [address]   Culture  Setup Time   Final    Organism ID to follow [email or web address], READ BACK BY AND VERIFIED WITH: KRISTIN MERRILL AT [id number]/17/21 SPortiaPerformed  at [facility], [address] [place], [place] [address]    Culture (A)  Final    STAPHYLOCOCCUS HOMINIS THE SIGNIFICANCE OF ISOLATING THIS ORGANISM FROM A SINGLE SET OF BLOOD CULTURES WHEN MULTIPLE SETS ARE DRAWN IS UNCERTAIN. PLEASE NOTIFY THE MICROBIOLOGY DEPARTMENT WITHIN ONE WEEK IF SPECIATION AND SENSITIVITIES ARE REQUIRED. Performed at [facility], [place] [address] [place], [place] [address]    Report Status 09/27/2020 FINAL  Final  Blood Culture ID Panel (Reflexed)     Status: Abnormal   Collection Time: 09/23/2020  7:39 PM  Result Value Ref Range Status   Enterococcus faecalis NOT DETECTED NOT DETECTED Final   Enterococcus Faecium NOT DETECTED NOT DETECTED Final   Listeria monocytogenes NOT DETECTED  NOT DETECTED Final   Staphylococcus species DETECTED (A) NOT DETECTED Final    Comment: CRITICAL RESULT CALLED TO, READ BACK BY AND VERIFIED WITH:  KRISTIN  MERRILL AT 6644 09/25/20 SDR    Staphylococcus aureus (BCID) NOT DETECTED NOT DETECTED Final   Staphylococcus epidermidis NOT DETECTED NOT DETECTED Final   Staphylococcus lugdunensis NOT DETECTED NOT DETECTED Final   Streptococcus species NOT DETECTED NOT DETECTED Final   Streptococcus agalactiae NOT DETECTED NOT DETECTED Final   Streptococcus pneumoniae NOT DETECTED NOT DETECTED Final   Streptococcus pyogenes NOT DETECTED NOT DETECTED Final   A.calcoaceticus-baumannii NOT DETECTED NOT DETECTED Final   Bacteroides fragilis NOT DETECTED NOT DETECTED Final   Enterobacterales NOT DETECTED NOT DETECTED Final   Enterobacter cloacae complex NOT DETECTED NOT DETECTED Final   Escherichia coli NOT DETECTED NOT DETECTED Final   Klebsiella aerogenes NOT DETECTED NOT DETECTED Final   Klebsiella oxytoca NOT DETECTED NOT DETECTED Final   Klebsiella pneumoniae NOT DETECTED NOT DETECTED Final   Proteus species NOT DETECTED NOT DETECTED Final   Salmonella species NOT DETECTED NOT DETECTED Final   Serratia marcescens NOT DETECTED NOT DETECTED Final   Haemophilus influenzae NOT DETECTED NOT DETECTED Final   Neisseria meningitidis NOT DETECTED NOT DETECTED Final   Pseudomonas aeruginosa NOT DETECTED NOT DETECTED Final   Stenotrophomonas maltophilia NOT DETECTED NOT DETECTED Final   Candida albicans NOT DETECTED NOT DETECTED Final   Candida auris NOT DETECTED NOT DETECTED Final   Candida glabrata NOT DETECTED NOT DETECTED Final   Candida krusei NOT DETECTED NOT DETECTED Final   Candida parapsilosis NOT DETECTED NOT DETECTED Final   Candida tropicalis NOT DETECTED NOT DETECTED Final   Cryptococcus neoformans/gattii NOT DETECTED NOT DETECTED Final    Comment: Performed at [facility], [address] [place], [place] [address]  Resp Panel  by RT-PCR (Flu A&B, Covid) Nasopharyngeal Swab     Status: Abnormal   Collection Time: 09/16/2020  7:40 PM   Specimen: Nasopharyngeal Swab; Nasopharyngeal(NP) swabs in vial transport medium  Result Value Ref Range Status   SARS Coronavirus 2 by RT PCR POSITIVE (A) NEGATIVE Final    Comment: RESULT CALLED TO, READ BACK BY AND VERIFIED WITH: KENDELL SHEETS _0  ON 09/13/2020 SKL (NOTE) SARS-CoV-2 target nucleic acids are DETECTED.  The SARS-CoV-2 RNA is generally detectable in upper respiratory specimens during the acute phase of infection. Positive results are indicative of the presence of the identified virus, but do not rule out bacterial infection or co-infection with other pathogens not detected by the test. Clinical correlation with patient history and other diagnostic information is necessary to determine patient infection status. The expected result is Negative.  Fact Sheet for Patients: EntrepreneurPulse.com.au  Fact Sheet for Healthcare Providers:  IncredibleEmployment.be  This test is not yet approved or cleared by the [place] and  has been authorized for detection and/or diagnosis of SARS-CoV-2 by FDA under an Emergency Use Authorization (EUA).  This EUA will remain in effect (meaning this test can  be used) for the duration of  the COVID-19 declaration under Section 564(b)(1) of the Act, 21 U.S.C. section 360bbb-3(b)(1), unless the authorization is terminated or revoked sooner.     Influenza A by PCR NEGATIVE NEGATIVE Final   Influenza B by PCR NEGATIVE NEGATIVE Final    Comment: (NOTE) The Xpert Xpress SARS-CoV-2/FLU/RSV plus assay is intended as an aid in the diagnosis of influenza from Nasopharyngeal swab specimens and should not be used as a sole basis for treatment. Nasal washings and aspirates are unacceptable for Xpert Xpress SARS-CoV-2/FLU/RSV testing.  Fact Sheet for  Patients: EntrepreneurPulse.com.au  Fact Sheet for Healthcare Providers: IncredibleEmployment.be  This test is not yet approved or cleared by the [place] FDA and has been authorized for detection and/or diagnosis of SARS-CoV-2 by FDA under an Emergency Use Authorization (EUA). This EUA will remain in effect (meaning this test can be used) for the duration of the COVID-19 declaration under Section 564(b)(1) of the Act, 21 U.S.C. section 360bbb-3(b)(1), unless the authorization is terminated or revoked.  Performed at [facility], [address] [place], [place] [address]   Blood Culture (routine x 2)     Status: None   Collection Time: 10/08/2020  7:44 PM   Specimen: BLOOD  Result Value Ref Range Status   Specimen Description BLOOD BLOOD LEFT HAND  Final   Special Requests   Final    BOTTLES DRAWN AEROBIC AND ANAEROBIC Blood Culture adequate volume   Culture   Final    NO GROWTH 5 DAYS Performed at [facility], [address] [place], [place] [address]    Report Status 09/29/2020 FINAL  Final  Urine culture     Status: None   Collection Time: 09/25/20  4:07 AM   Specimen: In/Out Cath Urine  Result Value Ref Range Status   Specimen Description   Final    IN/OUT CATH URINE Performed at [facility], [address], [place], [place] [address]    Special Requests   Final    NONE Performed at [facility], [address] [place], [place] [address]    Culture   Final    NO GROWTH Performed at [facility], [place] [address] [place], [place] [address]    Report Status 09/26/2020 FINAL  Final  MRSA PCR Screening     Status: None   Collection Time: 09/25/20  4:07 AM   Specimen: Nasal Mucosa; Nasopharyngeal  Result Value Ref Range Status   MRSA by PCR NEGATIVE NEGATIVE Final    Comment:        The GeneXpert MRSA Assay (FDA approved for NASAL specimens only), is one component of  a comprehensive MRSA colonization surveillance program. It is not intended to diagnose MRSA infection nor to guide or monitor treatment for MRSA infections. Performed at [facility], [address] [place], [place] [address]     Coagulation Studies: No results for input(s): LABPROT, INR in the last 72 hours.  Urinalysis: No results for input(s): COLORURINE, LABSPEC, PHURINE, GLUCOSEU, HGBUR, BILIRUBINUR, KETONESUR, PROTEINUR, UROBILINOGEN, NITRITE, LEUKOCYTESUR in the last 72 hours.  Invalid input(s): APPERANCEUR    Imaging: DG Chest Port 1 View  Result Date: 10/12/2020 CLINICAL DATA:  Acute respiratory failure. EXAM: PORTABLE CHEST 1 VIEW COMPARISON:  10/10/2020 FINDINGS: 0534 hours. Endotracheal tube tip is 4.3 cm above the base of the carina. Left IJ central line tip is at the innominate vein confluence. Probable esophageal temperature probe noted. The NG tube passes into the stomach although the distal tip position is not included on the film. Interval clear progression of near confluent airspace disease at the right base. Underlying diffuse interstitial lung opacity is similar to prior. Telemetry leads overlie the chest. IMPRESSION: 1. Interval progression of near confluent airspace disease at the right base. 2. Otherwise no substantial change. Electronically Signed   By: Misty Stanley M.D.   On: 10/12/2020 06:14     Medications:   . sodium chloride 5 mL/hr at 10/12/20 0746  . dexmedetomidine (PRECEDEX) IV infusion 1.2 mcg/kg/hr (10/12/20 1037)  . feeding supplement (VITAL 1.5 CAL) 1,000 mL (10/11/20 2053)  . HYDROmorphone 4 mg/hr (10/12/20 0833)  . midazolam 6 mg/hr (10/12/20 0746)   . vitamin C  500 mg Per Tube Daily  . chlorhexidine gluconate (MEDLINE KIT)  15 mL Mouth Rinse BID  . Chlorhexidine Gluconate Cloth  6 each Topical Q0600  . clonazePAM  2 mg Oral BID  . enoxaparin (LOVENOX) injection  1 mg/kg Subcutaneous Q12H  . famotidine  20 mg Oral Daily  .  feeding supplement (PROSource TF)  45 mL Per Tube Daily  . folic acid  1 mg Per Tube Daily  . free water  200 mL Per Tube Q6H  . insulin aspart  0-20 Units Subcutaneous Q4H  . insulin detemir  10 Units Subcutaneous BID  . mouth rinse  15 mL Mouth Rinse 10 times per day  . metoprolol tartrate  25 mg Oral BID  . multivitamin with minerals  1 tablet Per Tube Daily  . oxyCODONE  15 mg Oral Q6H  . patiromer  8.4 g Oral Daily  . predniSONE  30 mg Oral Q breakfast   Followed by  . [START ON 10/16/2020] predniSONE  20 mg Oral Q breakfast   Followed by  . [START ON 2020-11-19] predniSONE  10 mg Oral Q breakfast   Followed by  . [START ON 10/26/2020] predniSONE  5 mg Oral Q breakfast  . QUEtiapine  50 mg Oral BID  . thiamine  100 mg Per Tube Daily  . zinc sulfate  220 mg Per Tube Daily   acetaminophen, labetalol, midazolam, morphine injection, ondansetron **OR** ondansetron (ZOFRAN) IV, vecuronium  Assessment/ Plan:  Ms. Margaret Shepard is a 60 y.o. white female with hypertension, anxiety, anemia who was admitted on 10/02/2020 for Edema [R60.9] Lactic acid acidosis [E87.2] Acute respiratory failure with hypoxia (HCC) [J96.01] Pneumonia due to COVID-19 virus [U07.1, J12.82]  Hospital course complicated with staph bacteremia,   1. Acute kidney injury and hyperkalemia with baseline creatinine of 0.76, normal GFR on admission. Proteinuria and hematuria on urinalysis ATN secondary to IV contrast nephropathy, hypotension, sepsis and bacteremia.  Nonoliguric urine output  - Continue veltassa - No indication for dialysis  2. Acute Respiratory Failure requiring intubation and mechanical ventilation. Secondary to COVID-19 pneumonia and pulmonary embolism.     LOS: 18 Kaytlan Behrman 1/3/202211:18 AM

## 2020-10-12 NOTE — Progress Notes (Signed)
NAME:  Margaret Shepard, MRN:  [id number], DOB:  11-20-1960, LOS: 50 ADMISSION DATE:  10/01/2020, CONSULTATION DATE: 09/21/2020 REFERRING MD: Dr. Tamala Julian, CHIEF COMPLAINT: Shortness of Breath   Brief History:  60 yo female admitted with acute hypoxic respiratory failure secondary to pneumonia due to COVID-19 requiring HHFNC and NRB now intubated and severe resp failure complicated by DVT and PE  History of Present Illness:  This is a 60 yo unvaccinated female who presented to [facility] ER on 12/16 via EMS with c/o worsening shortness of breath, cough, nausea, diarrhea, and poor po intake.  Per ER notes pt tested positive for COVID-19 8 days prior to ER presentation.  EMS reported pt severely hypoxic with O2 sats less than 50% on RA.  Therefore she was placed on 15L NRB with O2 sats increasing to 81%.  Upon arrival to the ER pt transitioned to [facility] and NRB due to continued hypoxia and tachypnea.  Lab results revealed CO2 19, glucose 174, anon gap 18, LDH 646, troponin 260, ferritin 478, pct <0.10, lactic acid 4.9, BNP 294, and vbg pH 7.41/pCO2 37/bicarb 23.5.  COVID-19 positive and CXR concerning for multifocal pneumonia.  There was also concern of possible pulmonary embolism, however due to severe hypoxia pt unable to tolerate transfer for CTA Chest.  Therefore, heparin gtt initiated.  Pt also received aspirin, decadron, ibuprofen, zofran, and remdesivir.  She was subsequently admitted to ICU per PCCM team for additional workup and treatment, but remained in the ER pending ICU bed availability.   Significant Hospital Events:  12/16: Pt admitted to ICU with COVID-19 pneumonia requiring HHFNC and NRB remained in the ER pending ICU bed availability  12/17 high risk for cardiac arrest, on pressors 12/18 Off pressors 12/19 worsening renal function.  Nephrology consult 09/28/20- patient was noted to be severely acidemic with HAGMA in am s/p 2 amp bicarb with repeat ABG significantly improved 12/21- patient  will require HD, will place vascath today. Meds reviewed and changed, TG elevated on propofol. DCd IVF. 12/22-  Patient remains critically ill. Reviewed plan with nephrologist, patient will not have HD today. 10/01/20- patient on 35%FIo2 8peep, she diruesed >3L we will hold HD, continue diuresis. SBT.  10/02/20- patient with tachyarrythmia overnight.  S/p cardiology evaluation. For SBT today.  10/03/20- patient with no overnight events, remains critically ill on MV.  Diuresed well overnight.  Discussed care plan with nephrologist today.  10/04/20-  Patient remains critically ill.  She had SBT daily and has not been able to pass yet.  10/05/20-with no acute events overnight remains on PEEP of 8 and FiO2 of 55% 10/06/20-Patient on peep of 8 and FiO2 60%. Trying to weansedation but very difficult in spite of using oral medications 10/07/20-patient tolerated 5/5 trial for over 30 minutes.  She was able to follow commands today.  She was able to wiggle her right toes.  She was able to stick out her tongue. 10/08/2020- Lightly sedated, able to follow some simple commands.  Currently on 50% FiO2 & 8 PEEP, increased work of breathing on vent.  Consult ENT for Trach. 10/09/2020- Pt very asynchronous with vent, increasing sedation with prn Paralytics.  Begin Recruitment maneuvers and Diurese today;  ENT evaluated pt, tentative plan for trach some time next week 10/10/2020-switch to [facility], adjusted ventilator settings 10/11/2020-ongoing issues with ventilator asynchrony no matter what mode utilized.  Requiring intermittent paralytics.  10/13/19- updated Geraldine (POA). Plan for palliative care evaluation. Patient still critically ill after 17d. Poor prognosis  Consults:  [place]  Nephrology ENT  Procedures:  12/17: Endotracheal intubation 12/21: Left IJ Trialysis catheter inserted  Significant Diagnostic Tests:  12/16: CXR revealed multifocal pneumonia  12/17: Venous US Bilateral LE>>Positive for  BILATERAL lower extremity DVT, more extensive on the Left. 12/17: CTA Chest>>Small pulmonary embolism right upper lobe.Diffuse bilateral airspace disease compatible with COVID pneumonia. Bibasilar atelectasis. 12/19: Renal US>>1. Increased echogenicity of bilateral kidneys as can be seen in medical renal disease. No hydronephrosis.  Micro Data:  COVID-19 12/16>>positive  Influenza PCR 12/16>>negative  Urine 12/16>> no growth Blood x2 12/16>>STAPHYLOCOCCUS HOMINIS  Blood 12/15>> no growth MRSA PCR 12/17>>negative  Antimicrobials:  Remdesivir 12/16>>12/20 Azithromycin 12/16>>12/17 Rocephin 12/16>>12/17 Vancomycin 12/18>>12/19 Cefepime 12/26 (plan for 5 days)>>  Interim History / Subjective:  No events reported overnight Afebrile Very asynchronous with vent Sedated with Dilaudid, versed, precedex No vasopressors, actually hypertensive with agitation  Objective   Blood pressure 96/64, pulse 86, temperature 97.7 F (36.5 C), resp. rate (!) 21, height _0  (1.626 m), weight 66.9 kg, SpO2 93 %.    Vent Mode: PRVC FiO2 (%):  [50 %-60 %] 60 % Set Rate:  [30 bmp] 30 bmp Vt Set:  [450 mL] 450 mL PEEP:  [8 cmH20] 8 cmH20 Plateau Pressure:  [13 cmH20-31 cmH20] 26 cmH20   Intake/Output Summary (Last 24 hours) at 10/12/2020 0936 Last data filed at 10/12/2020 0746 Gross per 24 hour  Intake 2241.22 ml  Output 2025 ml  Net 216.22 ml   Filed Weights   10/10/20 0500 10/11/20 0500 10/12/20 0500  Weight: 66.9 kg 66.9 kg 66.9 kg    Examination: General: Critically ill-appearing female,intubated and sedated, synchronous with the ventilator  HEENT: Atraumatic, normocephalic, neck supple, no JVD Lungs: Coarse rhonchi throughout, no wheezing, asynchronous with ventilator accessory muscle use Cardiovascular: Tachycardia, regular rhythm, S1-S2, no murmurs, rubs, gallops, 2+ distal pulses Abdomen: Soft, nontender, nondistended, no guarding or rebound tenderness, bowel sounds positive  x4 Extremities: No deformities, no edema, normal bulk and tone Neuro: Sedated, withdraws from pain, pupils PERRLA Skin: Warm and dry.  No obvious rashes, lesions, ulceration  Assessment & Plan:   Acute hypoxic respiratory failure secondary to COVID-19 pneumonia with ARDS, and small Right Upper Lobe PE -Continue mechanical ventilation ventilator adjustments made -Discussed with RT during rounds -Wean PEEP and FiO2 as able to maintain O2 sats >88% -Goal plateau pressure less than 30, driving pressure less than 15 -Paralytics if necessary for vent synchrony, gas exchange -Deep sedation per PAD protocol, goal RASS -4, currently Dilaudid, versed, precedex -Diuresis as blood pressure and renal function can tolerate -VAP prevention order set -Completed course of Remdesivir and Barcinitib -Steroids>> On 30 mg of Prednisone undergoing taper -Vitamin C, zinc -Continue recruitment maneuvers -Pt unable to tolerate weaning trials, ENT consulted for Trach -Patient's prognosis overall is poor obtain palliative care consultation   Small Right Upper Lobe PE Bilateral LE DVT's -Transitioned to Lovenox therapeutic dose to assist with decreasing volume intake   Acute toxic metabolic encephalopathy, need for sedation -Increase RASS goal to -3 to -4 as she is dyssynchronous with the vent -Intermittent paralytics -Continue Dilaudid, Versed, Precedex -Increasing Klonopin to assist with weaning sedation                                                               -  Daily Wake up assessment -Provide supportive care   AKI -I&O's / urinary output -Follow BMP -Ensure adequate renal perfusion -Avoid nephrotoxic agents as able -Replace electrolytes as indicated -Nephrology following, appreciate input -PICC line in the a.m., discontinue dialysis catheter   Anemia without s/sx of Bleeding -Monitor for S/Sx of bleeding -Trend CBC -Lovenox treatment dose anticoagulation/VTE Prophylaxis -Transfuse  for Hgb <7   Hyperglycemia -CBG's -SSI -Follow ICU Hypo/Hyperglycemia protocol      Best practice (evaluated daily)  Diet: Tube Feeds, Dietician following Pain/Anxiety/Delirium protocol (if indicated): Dilaudid, Versed, Precedex VAP protocol (if indicated): yes DVT prophylaxis: Lovenox, therapeutic GI prophylaxis: Protonix Glucose control: SSI  Mobility: Bedrest  Disposition: ICU   Goals of Care:  Last date of multidisciplinary goals of care discussion: 10/09/2020 Family and staff present: Updated pt's aunt Alver Sorrow via telephone 10/11/2020 Summary of discussion: Plan for trach.  However may need to reevaluate goals of care Follow up goals of care discussion due: 10/11/2020, palliative care consultation obtained Code Status: Limited Code (NO CPR, Defib, ACLS meds)  Labs   CBC: Recent Labs  Lab 10/08/20 0422 10/09/20 0430 10/10/20 0457 10/11/20 0143 10/12/20 0632  WBC 11.1* 11.9* 12.2* 14.5* 12.6*  HGB 8.8* 8.9* 8.7* 8.4* 7.4*  HCT 26.7* 26.5* 28.6* 26.5* 23.9*  MCV 89.0 88.6 94.7 92.7 93.7  PLT 211 181 176 172 142*    Basic Metabolic Panel: Recent Labs  Lab 10/08/20 0422 10/09/20 0430 10/10/20 0457 10/11/20 0143 10/12/20 0632  NA 142 146* 142 144  145 142  K 4.3 4.5 5.5* 5.3*  5.3* 5.0  CL 104 104 99 101  102 103  CO2 29 32 32 32  33* 32  GLUCOSE 130* 142* 157* 116*  120* 148*  BUN 78* 75* 79* 77*  81* 79*  CREATININE 2.24* 2.13* 1.85* 1.77*  1.77* 1.75*  CALCIUM 8.6* 8.8* 8.6* 8.5*  8.6* 8.3*  MG 1.8 2.2 2.3 2.1 1.9  PHOS 4.3 4.4 5.5* 3.4 3.7   GFR: Estimated Creatinine Clearance: 32.2 mL/min (A) (by C-G formula based on SCr of 1.75 mg/dL (H)). Recent Labs  Lab 10/06/20 0500 10/07/20 0332 10/09/20 0430 10/10/20 0457 10/11/20 0143 10/12/20 0632  PROCALCITON 0.13  --   --   --   --  0.87  WBC 11.8*   < > 11.9* 12.2* 14.5* 12.6*   < > = values in this interval not displayed.    Liver Function Tests: Recent Labs  Lab  10/08/20 0422 10/09/20 0430 10/10/20 0457 10/11/20 0143 10/12/20 0272  ALBUMIN 2.2* 2.3* 2.3* 2.4* 2.0*   No results for input(s): LIPASE, AMYLASE in the last 168 hours. No results for input(s): AMMONIA in the last 168 hours.  ABG    Component Value Date/Time   PHART 7.36 10/11/2020 0500   PCO2ART 61 (H) 10/11/2020 0500   PO2ART 61 (L) 10/11/2020 0500   HCO3 34.5 (H) 10/11/2020 0500   ACIDBASEDEF 1.8 10/02/2020 0442   O2SAT 90.0 10/11/2020 0500     Coagulation Profile: No results for input(s): INR, PROTIME in the last 168 hours.  Cardiac Enzymes: No results for input(s): CKTOTAL, CKMB, CKMBINDEX, TROPONINI in the last 168 hours.  HbA1C: Hgb A1c MFr Bld  Date/Time Value Ref Range Status  09/25/2020 06:21 AM 6.6 (H) 4.8 - 5.6 % Final    Comment:    (NOTE) Pre diabetes:          5.7%-6.4%  Diabetes:              >  6.4%  Glycemic control for   <7.0% adults with diabetes     CBG: Recent Labs  Lab 10/11/20 1532 10/11/20 1949 10/11/20 2346 10/12/20 0531 10/12/20 0828  GLUCAP 251* 211* 126* 140* 166*    Review of Systems:   Unable to assess due to Critical illness, intubation and sedation  Allergies Allergies  Allergen Reactions  . Hydrocodone-Acetaminophen Nausea And Vomiting and Itching    Scheduled Meds: . vitamin C  500 mg Per Tube Daily  . chlorhexidine gluconate (MEDLINE KIT)  15 mL Mouth Rinse BID  . Chlorhexidine Gluconate Cloth  6 each Topical Q0600  . clonazePAM  2 mg Oral BID  . enoxaparin (LOVENOX) injection  1 mg/kg Subcutaneous Q12H  . famotidine  20 mg Oral Daily  . feeding supplement (PROSource TF)  45 mL Per Tube Daily  . folic acid  1 mg Per Tube Daily  . free water  200 mL Per Tube Q6H  . insulin aspart  0-20 Units Subcutaneous Q4H  . insulin detemir  10 Units Subcutaneous BID  . mouth rinse  15 mL Mouth Rinse 10 times per day  . metoprolol tartrate  25 mg Oral BID  . multivitamin with minerals  1 tablet Per Tube Daily  .  oxyCODONE  15 mg Oral Q6H  . patiromer  8.4 g Oral Daily  . predniSONE  30 mg Oral Q breakfast   Followed by  . [START ON 10/16/2020] predniSONE  20 mg Oral Q breakfast   Followed by  . [START ON 2020/11/07] predniSONE  10 mg Oral Q breakfast   Followed by  . [START ON 10/26/2020] predniSONE  5 mg Oral Q breakfast  . QUEtiapine  50 mg Oral BID  . thiamine  100 mg Per Tube Daily  . zinc sulfate  220 mg Per Tube Daily   Continuous Infusions: . sodium chloride 5 mL/hr at 10/12/20 0746  . dexmedetomidine (PRECEDEX) IV infusion 1.2 mcg/kg/hr (10/12/20 0746)  . feeding supplement (VITAL 1.5 CAL) 1,000 mL (10/11/20 2053)  . HYDROmorphone 4 mg/hr (10/12/20 0833)  . midazolam 6 mg/hr (10/12/20 0746)   PRN Meds:.acetaminophen, labetalol, midazolam, morphine injection, ondansetron **OR** ondansetron (ZOFRAN) IV, vecuronium     Critical care provider statement:    Critical care time (minutes):  33   Critical care time was exclusive of:  Separately billable procedures and  treating other patients   Critical care was necessary to treat or prevent imminent or  life-threatening deterioration of the following conditions:  acute hypoxemic respiratory failure due to Nettie, multiple comorbid conditions   Critical care was time spent personally by me on the following  activities:  Development of treatment plan with patient or surrogate,  discussions with consultants, evaluation of patient's response to  treatment, examination of patient, obtaining history from patient or  surrogate, ordering and performing treatments and interventions, ordering  and review of laboratory studies and re-evaluation of patient's condition   I assumed direction of critical care for this patient from another  provider in my specialty: no     Ottie Glazier, M.D.  Pulmonary [facility] [place]

## 2020-10-13 DIAGNOSIS — U071 COVID-19: Secondary | ICD-10-CM | POA: Diagnosis not present

## 2020-10-13 DIAGNOSIS — Z9911 Dependence on respirator [ventilator] status: Secondary | ICD-10-CM | POA: Diagnosis not present

## 2020-10-13 DIAGNOSIS — Z7189 Other specified counseling: Secondary | ICD-10-CM | POA: Diagnosis not present

## 2020-10-13 DIAGNOSIS — Z515 Encounter for palliative care: Secondary | ICD-10-CM

## 2020-10-13 DIAGNOSIS — J9601 Acute respiratory failure with hypoxia: Secondary | ICD-10-CM | POA: Diagnosis not present

## 2020-10-13 LAB — RENAL FUNCTION PANEL
Albumin: 2.2 g/dL — ABNORMAL LOW (ref 3.5–5.0)
Anion gap: 10 (ref 5–15)
BUN: 73 mg/dL — ABNORMAL HIGH (ref 6–20)
CO2: 32 mmol/L (ref 22–32)
Calcium: 8.5 mg/dL — ABNORMAL LOW (ref 8.9–10.3)
Chloride: 99 mmol/L (ref 98–111)
Creatinine, Ser: 1.55 mg/dL — ABNORMAL HIGH (ref 0.44–1.00)
GFR, Estimated: 38 mL/min — ABNORMAL LOW (ref >60)
Glucose, Bld: 124 mg/dL — ABNORMAL HIGH (ref 70–99)
Phosphorus: 3.8 mg/dL (ref 2.5–4.6)
Potassium: 4.6 mmol/L (ref 3.5–5.1)
Sodium: 141 mmol/L (ref 135–145)

## 2020-10-13 LAB — CBC
HCT: 25.1 % — ABNORMAL LOW (ref 36.0–46.0)
Hemoglobin: 7.8 g/dL — ABNORMAL LOW (ref 12.0–15.0)
MCH: 28.9 pg (ref 26.0–34.0)
MCHC: 31.1 g/dL (ref 30.0–36.0)
MCV: 93 fL (ref 80.0–100.0)
Platelets: 159 10*3/uL (ref 150–400)
RBC: 2.7 MIL/uL — ABNORMAL LOW (ref 3.87–5.11)
RDW: 15.1 % (ref 11.5–15.5)
WBC: 11.8 10*3/uL — ABNORMAL HIGH (ref 4.0–10.5)
nRBC: 0 % (ref 0.0–0.2)

## 2020-10-13 LAB — MAGNESIUM: Magnesium: 2 mg/dL (ref 1.7–2.4)

## 2020-10-13 LAB — PROCALCITONIN: Procalcitonin: 0.84 ng/mL

## 2020-10-13 LAB — GLUCOSE, CAPILLARY
Glucose-Capillary: 128 mg/dL — ABNORMAL HIGH (ref 70–99)
Glucose-Capillary: 142 mg/dL — ABNORMAL HIGH (ref 70–99)
Glucose-Capillary: 146 mg/dL — ABNORMAL HIGH (ref 70–99)
Glucose-Capillary: 150 mg/dL — ABNORMAL HIGH (ref 70–99)
Glucose-Capillary: 162 mg/dL — ABNORMAL HIGH (ref 70–99)
Glucose-Capillary: 180 mg/dL — ABNORMAL HIGH (ref 70–99)
Glucose-Capillary: 95 mg/dL (ref 70–99)

## 2020-10-13 MED ORDER — HEPARIN SODIUM (PORCINE) 5000 UNIT/ML IJ SOLN
5000.0000 [IU] | Freq: Three times a day (TID) | INTRAMUSCULAR | Status: AC
  Administered 2020-10-13 – 2020-10-14 (×4): 5000 [IU] via SUBCUTANEOUS
  Filled 2020-10-13 (×4): qty 1

## 2020-10-13 MED ORDER — HEPARIN SODIUM (PORCINE) 5000 UNIT/ML IJ SOLN
INTRAMUSCULAR | Status: AC
  Filled 2020-10-13: qty 1

## 2020-10-13 NOTE — Progress Notes (Signed)
NAME:  Margaret Shepard, MRN:  [id number], DOB:  05-19-1961, LOS: 53 ADMISSION DATE:  09/09/2020, CONSULTATION DATE: 10/08/2020 REFERRING MD: Dr. Tamala Shepard, CHIEF COMPLAINT: Shortness of Breath   Brief History:  60 yo female admitted with acute hypoxic respiratory failure secondary to pneumonia due to COVID-19 requiring HHFNC and NRB now intubated and severe resp failure complicated by DVT and PE  History of Present Illness:  This is a 60 yo unvaccinated female who presented to [facility] ER on 12/16 via EMS with c/o worsening shortness of breath, cough, nausea, diarrhea, and poor po intake.  Per ER notes pt tested positive for COVID-19 8 days prior to ER presentation.  EMS reported pt severely hypoxic with O2 sats less than 50% on RA.  Therefore she was placed on 15L NRB with O2 sats increasing to 81%.  Upon arrival to the ER pt transitioned to [facility] and NRB due to continued hypoxia and tachypnea.  Lab results revealed CO2 19, glucose 174, anon gap 18, LDH 646, troponin 260, ferritin 478, pct <0.10, lactic acid 4.9, BNP 294, and vbg pH 7.41/pCO2 37/bicarb 23.5.  COVID-19 positive and CXR concerning for multifocal pneumonia.  There was also concern of possible pulmonary embolism, however due to severe hypoxia pt unable to tolerate transfer for CTA Chest.  Therefore, heparin gtt initiated.  Pt also received aspirin, decadron, ibuprofen, zofran, and remdesivir.  She was subsequently admitted to ICU per PCCM team for additional workup and treatment, but remained in the ER pending ICU bed availability.   Significant Hospital Events:  12/16: Pt admitted to ICU with COVID-19 pneumonia requiring HHFNC and NRB remained in the ER pending ICU bed availability  12/17 high risk for cardiac arrest, on pressors 12/18 Off pressors 12/19 worsening renal function.  Nephrology consult 09/28/20- patient was noted to be severely acidemic with HAGMA in am s/p 2 amp bicarb with repeat ABG significantly improved 12/21- patient  will require HD, will place vascath today. Meds reviewed and changed, TG elevated on propofol. DCd IVF. 12/22-  Patient remains critically ill. Reviewed plan with nephrologist, patient will not have HD today. 10/01/20- patient on 35%FIo2 8peep, she diruesed >3L we will hold HD, continue diuresis. SBT.  10/02/20- patient with tachyarrythmia overnight.  S/p cardiology evaluation. For SBT today.  10/03/20- patient with no overnight events, remains critically ill on MV.  Diuresed well overnight.  Discussed care plan with nephrologist today.  10/04/20-  Patient remains critically ill.  She had SBT daily and has not been able to pass yet.  10/05/20-with no acute events overnight remains on PEEP of 8 and FiO2 of 55% 10/06/20-Patient on peep of 8 and FiO2 60%. Trying to weansedation but very difficult in spite of using oral medications 10/07/20-patient tolerated 5/5 trial for over 30 minutes.  She was able to follow commands today.  She was able to wiggle her right toes.  She was able to stick out her tongue. 10/08/2020- Lightly sedated, able to follow some simple commands.  Currently on 50% FiO2 & 8 PEEP, increased work of breathing on vent.  Consult ENT for Trach. 10/09/2020- Pt very asynchronous with vent, increasing sedation with prn Paralytics.  Begin Recruitment maneuvers and Diurese today;  ENT evaluated pt, tentative plan for trach some time next week 10/10/2020-switch to [facility], adjusted ventilator settings 10/11/2020-ongoing issues with ventilator asynchrony no matter what mode utilized.  Requiring intermittent paralytics.  10/13/19- updated Margaret Shepard (POA). Plan for palliative care evaluation. Patient still critically ill after 17d. Poor prognosis  10/14/19-  Patient continues to be critically ill. I spoke with Margaret Shepard and we discussed tracheostomy.  She met with palliative care.  I spoke with Dr Margaret Shepard ENT and plan is for trache on Thursday.    Consults:  [place]   Nephrology ENT  Procedures:  12/17: Endotracheal intubation 12/21: Left IJ Trialysis catheter inserted  Significant Diagnostic Tests:  12/16: CXR revealed multifocal pneumonia  12/17: Venous US Bilateral LE>>Positive for BILATERAL lower extremity DVT, more extensive on the Left. 12/17: CTA Chest>>Small pulmonary embolism right upper lobe.Diffuse bilateral airspace disease compatible with COVID pneumonia. Bibasilar atelectasis. 12/19: Renal US>>1. Increased echogenicity of bilateral kidneys as can be seen in medical renal disease. No hydronephrosis.  Micro Data:  COVID-19 12/16>>positive  Influenza PCR 12/16>>negative  Urine 12/16>> no growth Blood x2 12/16>>STAPHYLOCOCCUS HOMINIS  Blood 12/15>> no growth MRSA PCR 12/17>>negative  Antimicrobials:  Remdesivir 12/16>>12/20 Azithromycin 12/16>>12/17 Rocephin 12/16>>12/17 Vancomycin 12/18>>12/19 Cefepime 12/26 (plan for 5 days)>>  Interim History / Subjective:  No events reported overnight Afebrile Very asynchronous with vent Sedated with Dilaudid, versed, precedex No vasopressors, actually hypertensive with agitation  Objective   Blood pressure (!) 160/88, pulse (!) 118, temperature 98.78 F (37.1 C), resp. rate 18, height '5\' 4"'  (1.626 m), weight 66.9 kg, SpO2 98 %.    Vent Mode: PRVC FiO2 (%):  [45 %-55 %] 45 % Set Rate:  [30 bmp] 30 bmp Vt Set:  [450 mL-480 mL] 450 mL PEEP:  [8 cmH20] 8 cmH20 Plateau Pressure:  [20 cmH20-28 cmH20] 20 cmH20   Intake/Output Summary (Last 24 hours) at 10/13/2020 1531 Last data filed at 10/13/2020 1357 Gross per 24 hour  Intake 5019.13 ml  Output 3300 ml  Net 1719.13 ml   Filed Weights   10/11/20 0500 10/12/20 0500 10/13/20 0500  Weight: 66.9 kg 66.9 kg 66.9 kg    Examination: General: Critically ill-appearing female,intubated and sedated, synchronous with the ventilator  HEENT: Atraumatic, normocephalic, neck supple, no JVD Lungs: Coarse rhonchi throughout, no wheezing,  asynchronous with ventilator accessory muscle use Cardiovascular: Tachycardia, regular rhythm, S1-S2, no murmurs, rubs, gallops, 2+ distal pulses Abdomen: Soft, nontender, nondistended, no guarding or rebound tenderness, bowel sounds positive x4 Extremities: No deformities, no edema, normal bulk and tone Neuro: Sedated, withdraws from pain, pupils PERRLA Skin: Warm and dry.  No obvious rashes, lesions, ulceration  Assessment & Plan:   Acute hypoxic respiratory failure secondary to COVID-19 pneumonia with ARDS, and small Right Upper Lobe PE -Continue mechanical ventilation ventilator adjustments made -Discussed with RT during rounds -Wean PEEP and FiO2 as able to maintain O2 sats >88% -Goal plateau pressure less than 30, driving pressure less than 15 -Paralytics if necessary for vent synchrony, gas exchange -Deep sedation per PAD protocol, goal RASS -4, currently Dilaudid, versed, precedex -Diuresis as blood pressure and renal function can tolerate -VAP prevention order set -Completed course of Remdesivir and Barcinitib -Steroids>> On 30 mg of Prednisone undergoing taper -Vitamin C, zinc -Continue recruitment maneuvers -Pt unable to tolerate weaning trials, ENT consulted for Trach -Patient's prognosis overall is poor obtain palliative care consultation   Small Right Upper Lobe PE Bilateral LE DVT's -Transitioned to Lovenox therapeutic dose to assist with decreasing volume intake   Acute toxic metabolic encephalopathy, need for sedation -Increase RASS goal to -3 to -4 as she is dyssynchronous with the vent -Intermittent paralytics -Continue Dilaudid, Versed, Precedex -Increasing Klonopin to assist with weaning sedation                                                               -  Daily Wake up assessment -Provide supportive care   AKI -I&O's / urinary output -Follow BMP -Ensure adequate renal perfusion -Avoid nephrotoxic agents as able -Replace electrolytes as  indicated -Nephrology following, appreciate input -PICC line in the a.m., discontinue dialysis catheter   Anemia without s/sx of Bleeding -Monitor for S/Sx of bleeding -Trend CBC -Lovenox treatment dose anticoagulation/VTE Prophylaxis -Transfuse for Hgb <7   Hyperglycemia -CBG's -SSI -Follow ICU Hypo/Hyperglycemia protocol      Best practice (evaluated daily)  Diet: Tube Feeds, Dietician following Pain/Anxiety/Delirium protocol (if indicated): Dilaudid, Versed, Precedex VAP protocol (if indicated): yes DVT prophylaxis: Lovenox, therapeutic GI prophylaxis: Protonix Glucose control: SSI  Mobility: Bedrest  Disposition: ICU   Goals of Care:  Last date of multidisciplinary goals of care discussion: 10/09/2020 Family and staff present: Updated pt's aunt Alver Sorrow via telephone 10/11/2020 Summary of discussion: Plan for trach.  However may need to reevaluate goals of care Follow up goals of care discussion due: 10/11/2020, palliative care consultation obtained Code Status: Limited Code (NO CPR, Defib, ACLS meds)  Labs   CBC: Recent Labs  Lab 10/09/20 0430 10/10/20 0457 10/11/20 0143 10/12/20 0632 10/13/20 0448  WBC 11.9* 12.2* 14.5* 12.6* 11.8*  HGB 8.9* 8.7* 8.4* 7.4* 7.8*  HCT 26.5* 28.6* 26.5* 23.9* 25.1*  MCV 88.6 94.7 92.7 93.7 93.0  PLT 181 176 172 142* 768    Basic Metabolic Panel: Recent Labs  Lab 10/09/20 0430 10/10/20 0457 10/11/20 0143 10/12/20 0632 10/13/20 0448  NA 146* 142 144  145 142 141  K 4.5 5.5* 5.3*  5.3* 5.0 4.6  CL 104 99 101  102 103 99  CO2 32 32 32  33* 32 32  GLUCOSE 142* 157* 116*  120* 148* 124*  BUN 75* 79* 77*  81* 79* 73*  CREATININE 2.13* 1.85* 1.77*  1.77* 1.75* 1.55*  CALCIUM 8.8* 8.6* 8.5*  8.6* 8.3* 8.5*  MG 2.2 2.3 2.1 1.9 2.0  PHOS 4.4 5.5* 3.4 3.7 3.8   GFR: Estimated Creatinine Clearance: 36.3 mL/min (A) (by C-G formula based on SCr of 1.55 mg/dL (H)). Recent Labs  Lab 10/10/20 0457  10/11/20 0143 10/12/20 0632 10/13/20 0448  PROCALCITON  --   --  0.87 0.84  WBC 12.2* 14.5* 12.6* 11.8*    Liver Function Tests: Recent Labs  Lab 10/09/20 0430 10/10/20 0457 10/11/20 0143 10/12/20 0632 10/13/20 0448  ALBUMIN 2.3* 2.3* 2.4* 2.0* 2.2*   No results for input(s): LIPASE, AMYLASE in the last 168 hours. No results for input(s): AMMONIA in the last 168 hours.  ABG    Component Value Date/Time   PHART 7.36 10/11/2020 0500   PCO2ART 61 (H) 10/11/2020 0500   PO2ART 61 (L) 10/11/2020 0500   HCO3 34.5 (H) 10/11/2020 0500   ACIDBASEDEF 1.8 10/02/2020 0442   O2SAT 90.0 10/11/2020 0500     Coagulation Profile: No results for input(s): INR, PROTIME in the last 168 hours.  Cardiac Enzymes: No results for input(s): CKTOTAL, CKMB, CKMBINDEX, TROPONINI in the last 168 hours.  HbA1C: Hgb A1c MFr Bld  Date/Time Value Ref Range Status  09/25/2020 06:21 AM 6.6 (H) 4.8 - 5.6 % Final    Comment:    (NOTE) Pre diabetes:          5.7%-6.4%  Diabetes:              >6.4%  Glycemic control for   <7.0% adults with diabetes     CBG: Recent Labs  Lab 10/12/20 2012 10/13/20 1157  10/13/20 0403 10/13/20 0806 10/13/20 1237  GLUCAP 150* 146* 150* 95 180*    Review of Systems:   Unable to assess due to Critical illness, intubation and sedation  Allergies Allergies  Allergen Reactions  . Hydrocodone-Acetaminophen Nausea And Vomiting and Itching    Scheduled Meds: . vitamin C  500 mg Per Tube Daily  . chlorhexidine gluconate (MEDLINE KIT)  15 mL Mouth Rinse BID  . Chlorhexidine Gluconate Cloth  6 each Topical Q0600  . clonazePAM  2 mg Oral BID  . enoxaparin (LOVENOX) injection  1 mg/kg Subcutaneous Q12H  . famotidine  20 mg Oral Daily  . feeding supplement (PROSource TF)  45 mL Per Tube Daily  . folic acid  1 mg Per Tube Daily  . free water  200 mL Per Tube Q6H  . heparin      . insulin aspart  0-20 Units Subcutaneous Q4H  . insulin detemir  10 Units  Subcutaneous BID  . mouth rinse  15 mL Mouth Rinse 10 times per day  . metoprolol tartrate  25 mg Oral BID  . multivitamin with minerals  1 tablet Per Tube Daily  . oxyCODONE  15 mg Oral Q6H  . patiromer  8.4 g Oral Daily  . predniSONE  30 mg Oral Q breakfast   Followed by  . [START ON 10/16/2020] predniSONE  20 mg Oral Q breakfast   Followed by  . [START ON 2020/11/11] predniSONE  10 mg Oral Q breakfast   Followed by  . [START ON 10/26/2020] predniSONE  5 mg Oral Q breakfast  . QUEtiapine  50 mg Oral BID  . sodium chloride flush  10-40 mL Intracatheter Q12H  . thiamine  100 mg Per Tube Daily  . zinc sulfate  220 mg Per Tube Daily   Continuous Infusions: . sodium chloride 5 mL/hr at 10/13/20 0600  . dexmedetomidine (PRECEDEX) IV infusion Stopped (10/13/20 0835)  . feeding supplement (VITAL 1.5 CAL) 1,000 mL (10/12/20 2008)  . HYDROmorphone 4 mg/hr (10/13/20 0946)  . midazolam 8 mg/hr (10/13/20 1527)   PRN Meds:.acetaminophen, labetalol, midazolam, morphine injection, ondansetron **OR** ondansetron (ZOFRAN) IV, sodium chloride flush, vecuronium     Critical care provider statement:    Critical care time (minutes):  33   Critical care time was exclusive of:  Separately billable procedures and  treating other patients   Critical care was necessary to treat or prevent imminent or  life-threatening deterioration of the following conditions:  acute hypoxemic respiratory failure due to [place], multiple comorbid conditions   Critical care was time spent personally by me on the following  activities:  Development of treatment plan with patient or surrogate,  discussions with consultants, evaluation of patient's response to  treatment, examination of patient, obtaining history from patient or  surrogate, ordering and performing treatments and interventions, ordering  and review of laboratory studies and re-evaluation of patient's condition   I assumed direction of critical care for this  patient from another  provider in my specialty: no     Ottie Glazier, M.D.  [facility]ulmonary [facility] [place]

## 2020-10-13 NOTE — Consult Note (Cosign Needed)
Consultation Note Date: 10/13/2020   Patient Name: Margaret Shepard  DOB: Feb 09, 1961  MRN: [id number]  Age / Sex: 60 y.o., female  PCP: Margaret Lick, NP Referring Physician: Flora Lipps, MD  Reason for Consultation: Establishing goals of care  HPI/Patient Profile: 60 y.o. female  with past medical history of headache, seizures, anemia, hypertension, chronic back pain on chronic opioids, anxiety admitted on 09/18/2020 with COVID pneumonia. Hospitalization complicated by ongoing respiratory failure requiring prolonged vent support, renal failure (not needing dialysis), RUL PE/bilateral DVT. Tentative plans for tracheostomy placement.   Clinical Assessment and Goals of Care: I met today outside Margaret Shepard's room. She is on ventilator and continues with sedation. She appears uncomfortable and breathing appears labored even on vent with dilaudid and versed infusions. Concern in lack of improvement and palliative care requested to revisit if family still desires to pursue tracheostomy.   I called and spoke with aunt, Margaret Shepard, who is noted to be Margaret Shepard's next of kin surrogate decision maker. Margaret Shepard shares that Margaret Shepard's parents have died and that she has 3 aunts and 1 brother. Brother, Margaret Shepard, is difficult to reach but they have been discussing Margaret Shepard and Margaret Shepard has tried to keep Margaret Shepard informed and up to date. They have not yet discussed tracheostomy as Margaret Shepard would like to have this conversation in person. We discussed concern that the longer Margaret Shepard goes without improvement the more I worry that her prognosis is poor and she may not benefit from tracheostomy. Margaret Shepard understands and is saddened to have to make this decision. She plans to meet with her siblings and Margaret Shepard's brother to discuss as a family. We discussed having family meeting to discuss and answer their questions and it may even be helpful  to have them see Margaret Shepard via glass door before making final decision. I will be certainly be willing to assist and coordinate with [name]'s family anything that will make this decision easier for them.   We discussed Margaret Shepard prior to current illness and Margaret Shepard describes her as very active and she loved her cat, plants and flowers, church, and riding her bike. She has had chronic pain from previous injury years ago but seems this was well managed with her pain regimen and did not pose limitations on Margaret Shepard quality of life. I prompted the question of Margaret Shepard that if Margaret Shepard were a part of this conversation we need to think of what she would say and what she would want. Margaret Shepard feels that Margaret Shepard would not want this quality of life.   We discussed alternative to tracheostomy to be liberating Margaret Shepard from ventilator and focus on comfort and symptom management but with expectations that her time would be short. We discussed that in these situations family would be allowed to be with Margaret Shepard during this time and at end of life. Margaret Shepard also asks about potential body donation to science and I shared that there are specific requirements for this but I do not know all these requirements and this can be discussed directly with the agency (I do  know of previous patients who have worked with Margaret Shepard and Margaret Shepard for this). Margaret Shepard will speak further with her family and will update me tomorrow on their thoughts and decisions.   All questions/concerns addressed to the best of my ability. Emotional support provided.   Primary Decision Maker NEXT OF KIN legally brother, Margaret Shepard, but family discussing together with Margaret Shepard as main contact. I did reach out to [facility] but no answer so I did leave voicemail with my contact information for him.     SUMMARY OF RECOMMENDATIONS   - Ongoing family discussion for tracheostomy vs one way extubation  Code Status/Advance Care Planning:  Limited code - already in  place   Symptom Management:   Per PCCM  Palliative Prophylaxis:   Aspiration, Bowel Regimen, Delirium Protocol, Frequent Pain Assessment, Oral Care and Turn Reposition  Psycho-social/Spiritual:   Desire for further Chaplaincy support:yes  Additional Recommendations: Compassionate Wean Education and Grief/Bereavement Support  Prognosis:   Overall prognosis poor.   Discharge Planning: To Be Determined      Primary Diagnoses: Present on Admission: . Pneumonia due to COVID-19 virus   I have reviewed the medical record, interviewed the patient and family, and examined the patient. The following aspects are pertinent.  Past Medical History:  Diagnosis Date  . Anemia    noted during hip surgery   . Anxiety    states no longer issue  . Frequent headaches   . Hypertension   . Lumbago   . 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 Marital status: Divorced    Spouse name: Not on file  . Number of children: 0  . Years of education: Not on file  . Highest education level: Not on file  Occupational History  . Not on file  Tobacco Use  . Smoking status: Former Research scientist (life sciences)  . Smokeless tobacco: Never Used  . Tobacco comment: 8-9 years ago  Vaping Use  . Vaping Use: Never used  Substance and Sexual Activity  . Alcohol use: Yes    Alcohol/week: 0.0 standard drinks    Comment: socially  . Drug use: No  . Sexual activity: Not Currently  Other Topics Concern  . Not on file  Social History Narrative  . Not on file   Social Determinants of Health   Financial Resource Strain: Not on file  Food Insecurity: Not on file  Transportation Needs: Not on file  Physical Activity: Not on file  Stress: Not on file  Social Connections: Not on file   Family History  Problem Relation Age of Onset  . Arthritis Mother   . COPD Mother   . Hyperlipidemia Mother   . Kidney disease Mother   . Alcohol abuse Father   .  COPD Sister   . Stroke Sister   . Hypertension Sister   . Cancer Maternal Grandmother    Scheduled Meds: . vitamin C  500 mg Per Tube Daily  . chlorhexidine gluconate (MEDLINE KIT)  15 mL Mouth Rinse BID  . Chlorhexidine Gluconate Cloth  6 each Topical Q0600  . clonazePAM  2 mg Oral BID  . enoxaparin (LOVENOX) injection  1 mg/kg Subcutaneous Q12H  . famotidine  20 mg Oral Daily  . feeding supplement (PROSource TF)  45 mL Per Tube Daily  . folic acid  1 mg Per Tube Daily  . free water  200 mL Per Tube Q6H  . heparin      .  insulin aspart  0-20 Units Subcutaneous Q4H  . insulin detemir  10 Units Subcutaneous BID  . mouth rinse  15 mL Mouth Rinse 10 times per day  . metoprolol tartrate  25 mg Oral BID  . multivitamin with minerals  1 tablet Per Tube Daily  . oxyCODONE  15 mg Oral Q6H  . patiromer  8.4 g Oral Daily  . predniSONE  30 mg Oral Q breakfast   Followed by  . [START ON 10/16/2020] predniSONE  20 mg Oral Q breakfast   Followed by  . [START ON 10-29-2020] predniSONE  10 mg Oral Q breakfast   Followed by  . [START ON 10/26/2020] predniSONE  5 mg Oral Q breakfast  . QUEtiapine  50 mg Oral BID  . sodium chloride flush  10-40 mL Intracatheter Q12H  . thiamine  100 mg Per Tube Daily  . zinc sulfate  220 mg Per Tube Daily   Continuous Infusions: . sodium chloride 5 mL/hr at 10/13/20 0600  . dexmedetomidine (PRECEDEX) IV infusion Stopped (10/13/20 0835)  . feeding supplement (VITAL 1.5 CAL) 1,000 mL (10/12/20 2008)  . HYDROmorphone 4 mg/hr (10/13/20 0946)  . midazolam 8 mg/hr (10/13/20 1002)   PRN Meds:.acetaminophen, labetalol, midazolam, morphine injection, ondansetron **OR** ondansetron (ZOFRAN) IV, sodium chloride flush, vecuronium Allergies  Allergen Reactions  . Hydrocodone-Acetaminophen Nausea And Vomiting and Itching   Review of Systems  Unable to perform ROS: Acuity of condition    Physical Exam Vitals and nursing note reviewed.  Constitutional:      General:  She is not in acute distress.    Appearance: She is ill-appearing.     Interventions: She is sedated and intubated.  Cardiovascular:     Rate and Rhythm: Tachycardia present.  Pulmonary:     Effort: Accessory muscle usage present. No tachypnea. She is intubated.     Comments: Tugging/asynchrony on vent, appears uncomfortable with occasional grimacing Abdominal:     General: Abdomen is flat.  Neurological:     Comments: Sedated on vent Occasional non-purposeful movement Withdraws to pain     Vital Signs: BP 118/72 (BP Location: Right Arm)   Pulse 84   Temp 97.7 F (36.5 C) (Esophageal)   Resp 16   Ht $R'5\' 4"'iE$  (1.626 m)   Wt 66.9 kg   SpO2 97%   BMI 25.32 kg/m  Pain Scale: CPOT   Pain Score: 0-No pain   SpO2: SpO2: 97 % O2 Device:SpO2: 97 % O2 Flow Rate: .O2 Flow Rate (L/min): 15 L/min  IO: Intake/output summary:   Intake/Output Summary (Last 24 hours) at 10/13/2020 1052 Last data filed at 10/13/2020 0600 Gross per 24 hour  Intake 5019.13 ml  Output 2550 ml  Net 2469.13 ml    LBM: Last BM Date: 10/13/20 Baseline Weight: Weight: 63.5 kg Most recent weight: Weight: 66.9 kg     Palliative Assessment/Data:     Time In/Out: 1040-1100, 1240-1310 Time Total: 50 min Greater than 50%  of this time was spent counseling and coordinating care related to the above assessment and plan.  Signed by: Vinie Sill, NP Palliative Medicine Team Pager # [phone] (M-F 8a-5p) Team Phone # [phone] (Nights/Weekends)

## 2020-10-13 NOTE — Progress Notes (Signed)
[facility]  ROUNDING NOTE   Subjective:   Afebrile UOP 2545m  Creatinine 1.55 (1.75)  Objective:  Vital signs in last 24 hours:  Temp:  [96.8 F (36 C)-98.6 F (37 C)] 97.7 F (36.5 C) (01/04 0800) Pulse Rate:  [84-97] 84 (01/04 0800) Resp:  [16-31] 16 (01/04 0800) BP: (99-127)/(58-83) 118/72 (01/04 0800) SpO2:  [94 %-100 %] 97 % (01/04 0800) FiO2 (%):  [50 %-55 %] 50 % (01/04 0800) Weight:  [66.9 kg] 66.9 kg (01/04 0500)  Weight change: 0 kg Filed Weights   10/11/20 0500 10/12/20 0500 10/13/20 0500  Weight: 66.9 kg 66.9 kg 66.9 kg    Intake/Output: I/O last 3 completed shifts: In: 59470[I.V.:1602; NG/GT:4106.1] Out: 3675 [Urine:3675]   Intake/Output this shift:  No intake/output data recorded.  Physical Exam: General: Critically il   Head: ETT   Eyes: Pupils do not seem equal  Neck:  trachea midline  Lungs:  PRVC FiO2 50%  Heart: Regular rate and rhythm  Abdomen:  Soft, obese  Extremities:  no peripheral edema.  Neurologic: Intubated, sedated  Skin: No lesions  Access: none    Basic Metabolic Panel: Recent Labs  Lab 10/09/20 0430 10/10/20 0457 10/11/20 0143 10/12/20 0632 10/13/20 0448  NA 146* 142 144  145 142 141  K 4.5 5.5* 5.3*  5.3* 5.0 4.6  CL 104 99 101  102 103 99  CO2 32 32 32  33* 32 32  GLUCOSE 142* 157* 116*  120* 148* 124*  BUN 75* 79* 77*  81* 79* 73*  CREATININE 2.13* 1.85* 1.77*  1.77* 1.75* 1.55*  CALCIUM 8.8* 8.6* 8.5*  8.6* 8.3* 8.5*  MG 2.2 2.3 2.1 1.9 2.0  PHOS 4.4 5.5* 3.4 3.7 3.8    Liver Function Tests: Recent Labs  Lab 10/09/20 0430 10/10/20 0457 10/11/20 0143 10/12/20 0632 10/13/20 0448  ALBUMIN 2.3* 2.3* 2.4* 2.0* 2.2*   No results for input(s): LIPASE, AMYLASE in the last 168 hours. No results for input(s): AMMONIA in the last 168 hours.  CBC: Recent Labs  Lab 10/09/20 0430 10/10/20 0457 10/11/20 0143 10/12/20 0632 10/13/20 0448  WBC 11.9* 12.2* 14.5* 12.6* 11.8*  HGB 8.9*  8.7* 8.4* 7.4* 7.8*  HCT 26.5* 28.6* 26.5* 23.9* 25.1*  MCV 88.6 94.7 92.7 93.7 93.0  PLT 181 176 172 142* 159    Cardiac Enzymes: No results for input(s): CKTOTAL, CKMB, CKMBINDEX, TROPONINI in the last 168 hours.  BNP: Invalid input(s): POCBNP  CBG: Recent Labs  Lab 10/12/20 1621 10/12/20 2012 10/13/20 0052 10/13/20 0403 10/13/20 0806  GLUCAP 199* 150* 146* 150* 95    Microbiology: Results for orders placed or performed during the hospital encounter of 09/09/2020  Blood Culture (routine x 2)     Status: Abnormal   Collection Time: 09/23/2020  7:39 PM   Specimen: BLOOD  Result Value Ref Range Status   Specimen Description   Final    BLOOD LEFT ANTECUBITAL Performed at [facility] [address], [place] [address]   Special Requests   Final    BOTTLES DRAWN AEROBIC AND ANAEROBIC Blood Culture adequate volume [facility] [address], [place] [address]   Culture  Setup Time   Final    Organism ID to follow [place] CityTO, READ BACK BY AND VERIFIED WITH: KU[facility] LlLPMERRILL AT [id number]/17/21 SGodwinPerformed at [facility] [address], [place] [address]    Culture (A)  Final    STAPHYLOCOCCUS HOMINIS THE SIGNIFICANCE OF ISOLATING THIS ORGANISM FROM A SINGLE SET OF BLOOD CULTURES WHEN MULTIPLE SETS ARE DRAWN IS UNCERTAIN. PLEASE NOTIFY THE MICROBIOLOGY DEPARTMENT WITHIN ONE WEEK IF SPECIATION AND SENSITIVITIES ARE REQUIRED. Performed at [facility], [place] [address], [place], [place] [address]    Report Status 09/27/2020 FINAL  Final  Blood Culture ID Panel (Reflexed)     Status: Abnormal   Collection Time: 09/20/2020  7:39 PM  Result Value Ref Range Status   Enterococcus faecalis NOT DETECTED NOT DETECTED Final   Enterococcus Faecium NOT DETECTED NOT DETECTED Final   Listeria monocytogenes NOT DETECTED NOT DETECTED Final   Staphylococcus  species DETECTED (A) NOT DETECTED Final    Comment: CRITICAL RESULT CALLED TO, READ BACK BY AND VERIFIED WITH:  KRISTIN  MERRILL AT 8309 09/25/20 SDR    Staphylococcus aureus (BCID) NOT DETECTED NOT DETECTED Final   Staphylococcus epidermidis NOT DETECTED NOT DETECTED Final   Staphylococcus lugdunensis NOT DETECTED NOT DETECTED Final   Streptococcus species NOT DETECTED NOT DETECTED Final   Streptococcus agalactiae NOT DETECTED NOT DETECTED Final   Streptococcus pneumoniae NOT DETECTED NOT DETECTED Final   Streptococcus pyogenes NOT DETECTED NOT DETECTED Final   A.calcoaceticus-baumannii NOT DETECTED NOT DETECTED Final   Bacteroides fragilis NOT DETECTED NOT DETECTED Final   Enterobacterales NOT DETECTED NOT DETECTED Final   Enterobacter cloacae complex NOT DETECTED NOT DETECTED Final   Escherichia coli NOT DETECTED NOT DETECTED Final   Klebsiella aerogenes NOT DETECTED NOT DETECTED Final   Klebsiella oxytoca NOT DETECTED NOT DETECTED Final   Klebsiella pneumoniae NOT DETECTED NOT DETECTED Final   Proteus species NOT DETECTED NOT DETECTED Final   Salmonella species NOT DETECTED NOT DETECTED Final   Serratia marcescens NOT DETECTED NOT DETECTED Final   Haemophilus influenzae NOT DETECTED NOT DETECTED Final   Neisseria meningitidis NOT DETECTED NOT DETECTED Final   Pseudomonas aeruginosa NOT DETECTED NOT DETECTED Final   Stenotrophomonas maltophilia NOT DETECTED NOT DETECTED Final   Candida albicans NOT DETECTED NOT DETECTED Final   Candida auris NOT DETECTED NOT DETECTED Final   Candida glabrata NOT DETECTED NOT DETECTED Final   Candida krusei NOT DETECTED NOT DETECTED Final   Candida parapsilosis NOT DETECTED NOT DETECTED Final   Candida tropicalis NOT DETECTED NOT DETECTED Final   Cryptococcus neoformans/gattii NOT DETECTED NOT DETECTED Final    Comment: Performed at [facility], [address] [place], [place] [address]  Resp Panel by RT-PCR (Flu A&B, Covid)  Nasopharyngeal Swab     Status: Abnormal   Collection Time: 09/16/2020  7:40 PM   Specimen: Nasopharyngeal Swab; Nasopharyngeal(NP) swabs in vial transport medium  Result Value Ref Range Status   SARS Coronavirus 2 by RT PCR POSITIVE (A) NEGATIVE Final    Comment: RESULT CALLED TO, READ BACK BY AND VERIFIED WITH: KENDELL SHEETS _0  ON 09/29/2020 SKL (NOTE) SARS-CoV-2 target nucleic acids are DETECTED.  The SARS-CoV-2 RNA is generally detectable in upper respiratory specimens during the acute phase of infection. Positive results are indicative of the presence of the identified virus, but do not rule out bacterial infection or co-infection with other pathogens not detected by the test. Clinical correlation with patient history and other diagnostic information is necessary to determine patient infection status. The expected result is Negative.  Fact Sheet for Patients: EntrepreneurPulse.com.au  Fact Sheet for Healthcare Providers: IncredibleEmployment.be  This test is not yet approved or  cleared by the [place] and  has been authorized for detection and/or diagnosis of SARS-CoV-2 by FDA under an Emergency Use Authorization (EUA).  This EUA will remain in effect (meaning this test can  be used) for the duration of  the COVID-19 declaration under Section 564(b)(1) of the Act, 21 U.S.C. section 360bbb-3(b)(1), unless the authorization is terminated or revoked sooner.     Influenza A by PCR NEGATIVE NEGATIVE Final   Influenza B by PCR NEGATIVE NEGATIVE Final    Comment: (NOTE) The Xpert Xpress SARS-CoV-2/FLU/RSV plus assay is intended as an aid in the diagnosis of influenza from Nasopharyngeal swab specimens and should not be used as a sole basis for treatment. Nasal washings and aspirates are unacceptable for Xpert Xpress SARS-CoV-2/FLU/RSV testing.  Fact Sheet for Patients: EntrepreneurPulse.com.au  Fact Sheet for  Healthcare Providers: IncredibleEmployment.be  This test is not yet approved or cleared by the [place] FDA and has been authorized for detection and/or diagnosis of SARS-CoV-2 by FDA under an Emergency Use Authorization (EUA). This EUA will remain in effect (meaning this test can be used) for the duration of the COVID-19 declaration under Section 564(b)(1) of the Act, 21 U.S.C. section 360bbb-3(b)(1), unless the authorization is terminated or revoked.  Performed at [facility], [address] [place], [place] [address]   Blood Culture (routine x 2)     Status: None   Collection Time: 10/01/2020  7:44 PM   Specimen: BLOOD  Result Value Ref Range Status   Specimen Description BLOOD BLOOD LEFT HAND  Final   Special Requests   Final    BOTTLES DRAWN AEROBIC AND ANAEROBIC Blood Culture adequate volume   Culture   Final    NO GROWTH 5 DAYS Performed at [facility], [address] [place], [place] [address]    Report Status 09/29/2020 FINAL  Final  Urine culture     Status: None   Collection Time: 09/25/20  4:07 AM   Specimen: In/Out Cath Urine  Result Value Ref Range Status   Specimen Description   Final    IN/OUT CATH URINE Performed at [facility], [address], [place], [place] [address]    Special Requests   Final    NONE Performed at [facility], [address] [place], [place] [address]    Culture   Final    NO GROWTH Performed at [facility], [place] [address], [place], [place] [address]    Report Status 09/26/2020 FINAL  Final  MRSA PCR Screening     Status: None   Collection Time: 09/25/20  4:07 AM   Specimen: Nasal Mucosa; Nasopharyngeal  Result Value Ref Range Status   MRSA by PCR NEGATIVE NEGATIVE Final    Comment:        The GeneXpert MRSA Assay (FDA approved for NASAL specimens only), is one component of a comprehensive MRSA colonization surveillance program. It is not intended to  diagnose MRSA infection nor to guide or monitor treatment for MRSA infections. Performed at [facility], [address] [place], [place] [address]   CULTURE, BLOOD (ROUTINE X 2) w Reflex to ID Panel     Status: None (Preliminary result)   Collection Time: 10/12/20  9:46 AM   Specimen: BLOOD RIGHT HAND  Result Value Ref Range Status   Specimen Description BLOOD RIGHT HAND  Final   Special Requests   Final    BOTTLES DRAWN AEROBIC AND ANAEROBIC Blood Culture adequate volume  Culture   Final    NO GROWTH < 24 HOURS Performed at [facility], [address] [place], [place] [address]    Report Status PENDING  Incomplete  CULTURE, BLOOD (ROUTINE X 2) w Reflex to ID Panel     Status: None (Preliminary result)   Collection Time: 10/12/20  9:46 AM   Specimen: BLOOD RIGHT ARM  Result Value Ref Range Status   Specimen Description BLOOD RIGHT ARM  Final   Special Requests   Final    BOTTLES DRAWN AEROBIC AND ANAEROBIC Blood Culture adequate volume   Culture   Final    NO GROWTH < 24 HOURS Performed at [facility], [address] [place], [place] [address]    Report Status PENDING  Incomplete    Coagulation Studies: No results for input(s): LABPROT, INR in the last 72 hours.  Urinalysis: No results for input(s): COLORURINE, LABSPEC, PHURINE, GLUCOSEU, HGBUR, BILIRUBINUR, KETONESUR, PROTEINUR, UROBILINOGEN, NITRITE, LEUKOCYTESUR in the last 72 hours.  Invalid input(s): APPERANCEUR    Imaging: DG Chest Port 1 View  Result Date: 10/12/2020 CLINICAL DATA:  Acute respiratory failure. EXAM: PORTABLE CHEST 1 VIEW COMPARISON:  10/10/2020 FINDINGS: 0534 hours. Endotracheal tube tip is 4.3 cm above the base of the carina. Left IJ central line tip is at the innominate vein confluence. Probable esophageal temperature probe noted. The NG tube passes into the stomach although the distal tip position is not included on the film. Interval clear progression of near  confluent airspace disease at the right base. Underlying diffuse interstitial lung opacity is similar to prior. Telemetry leads overlie the chest. IMPRESSION: 1. Interval progression of near confluent airspace disease at the right base. 2. Otherwise no substantial change. Electronically Signed   By: Misty Stanley M.D.   On: 10/12/2020 06:14     Medications:   . sodium chloride 5 mL/hr at 10/13/20 0600  . dexmedetomidine (PRECEDEX) IV infusion Stopped (10/13/20 0835)  . feeding supplement (VITAL 1.5 CAL) 1,000 mL (10/12/20 2008)  . HYDROmorphone 4 mg/hr (10/13/20 0946)  . midazolam 8 mg/hr (10/13/20 1002)   . vitamin C  500 mg Per Tube Daily  . chlorhexidine gluconate (MEDLINE KIT)  15 mL Mouth Rinse BID  . Chlorhexidine Gluconate Cloth  6 each Topical Q0600  . clonazePAM  2 mg Oral BID  . enoxaparin (LOVENOX) injection  1 mg/kg Subcutaneous Q12H  . famotidine  20 mg Oral Daily  . feeding supplement (PROSource TF)  45 mL Per Tube Daily  . folic acid  1 mg Per Tube Daily  . free water  200 mL Per Tube Q6H  . heparin      . insulin aspart  0-20 Units Subcutaneous Q4H  . insulin detemir  10 Units Subcutaneous BID  . mouth rinse  15 mL Mouth Rinse 10 times per day  . metoprolol tartrate  25 mg Oral BID  . multivitamin with minerals  1 tablet Per Tube Daily  . oxyCODONE  15 mg Oral Q6H  . patiromer  8.4 g Oral Daily  . predniSONE  30 mg Oral Q breakfast   Followed by  . [START ON 10/16/2020] predniSONE  20 mg Oral Q breakfast   Followed by  . [START ON 11/11/20] predniSONE  10 mg Oral Q breakfast   Followed by  . [START ON 10/26/2020] predniSONE  5 mg Oral Q breakfast  . QUEtiapine  50 mg Oral BID  . sodium chloride flush  10-40 mL Intracatheter Q12H  .  thiamine  100 mg Per Tube Daily  . zinc sulfate  220 mg Per Tube Daily   acetaminophen, labetalol, midazolam, morphine injection, ondansetron **OR** ondansetron (ZOFRAN) IV, sodium chloride flush, vecuronium  Assessment/ Plan:  Ms.  Margaret Shepard is a 60 y.o. white female with hypertension, anxiety, anemia who was admitted on 10/04/2020 for Edema [R60.9] Lactic acid acidosis [E87.2] Acute respiratory failure with hypoxia (HCC) [J96.01] Pneumonia due to COVID-19 virus [U07.1, J12.82]  Hospital course complicated with staph bacteremia,   1. Acute kidney injury and hyperkalemia with baseline creatinine of 0.76, normal GFR on admission. Proteinuria and hematuria on urinalysis ATN secondary to IV contrast nephropathy, hypotension, sepsis and bacteremia.  Nonoliguric urine output  - Continue veltassa - No indication for dialysis  2. Acute Respiratory Failure requiring intubation and mechanical ventilation. Secondary to COVID-19 pneumonia and pulmonary embolism.   Will sign off. Please call with questions.     LOS: [place] 1/4/202212:22 PM

## 2020-10-14 ENCOUNTER — Encounter: Payer: Self-pay | Admitting: Internal Medicine

## 2020-10-14 DIAGNOSIS — J9601 Acute respiratory failure with hypoxia: Secondary | ICD-10-CM | POA: Diagnosis not present

## 2020-10-14 DIAGNOSIS — J9600 Acute respiratory failure, unspecified whether with hypoxia or hypercapnia: Secondary | ICD-10-CM | POA: Diagnosis not present

## 2020-10-14 DIAGNOSIS — J1282 Pneumonia due to coronavirus disease 2019: Secondary | ICD-10-CM | POA: Diagnosis not present

## 2020-10-14 DIAGNOSIS — Z7189 Other specified counseling: Secondary | ICD-10-CM | POA: Diagnosis not present

## 2020-10-14 DIAGNOSIS — Z515 Encounter for palliative care: Secondary | ICD-10-CM | POA: Diagnosis not present

## 2020-10-14 DIAGNOSIS — U071 COVID-19: Secondary | ICD-10-CM | POA: Diagnosis not present

## 2020-10-14 LAB — CBC
HCT: 24.7 % — ABNORMAL LOW (ref 36.0–46.0)
Hemoglobin: 7.9 g/dL — ABNORMAL LOW (ref 12.0–15.0)
MCH: 29.8 pg (ref 26.0–34.0)
MCHC: 32 g/dL (ref 30.0–36.0)
MCV: 93.2 fL (ref 80.0–100.0)
Platelets: 208 10*3/uL (ref 150–400)
RBC: 2.65 MIL/uL — ABNORMAL LOW (ref 3.87–5.11)
RDW: 15.5 % (ref 11.5–15.5)
WBC: 10 10*3/uL (ref 4.0–10.5)
nRBC: 0 % (ref 0.0–0.2)

## 2020-10-14 LAB — RENAL FUNCTION PANEL
Albumin: 2.2 g/dL — ABNORMAL LOW (ref 3.5–5.0)
Anion gap: 11 (ref 5–15)
BUN: 63 mg/dL — ABNORMAL HIGH (ref 6–20)
CO2: 32 mmol/L (ref 22–32)
Calcium: 8.7 mg/dL — ABNORMAL LOW (ref 8.9–10.3)
Chloride: 99 mmol/L (ref 98–111)
Creatinine, Ser: 1.34 mg/dL — ABNORMAL HIGH (ref 0.44–1.00)
GFR, Estimated: 45 mL/min — ABNORMAL LOW (ref >60)
Glucose, Bld: 130 mg/dL — ABNORMAL HIGH (ref 70–99)
Phosphorus: 3.6 mg/dL (ref 2.5–4.6)
Potassium: 4.4 mmol/L (ref 3.5–5.1)
Sodium: 142 mmol/L (ref 135–145)

## 2020-10-14 LAB — PROCALCITONIN: Procalcitonin: 0.65 ng/mL

## 2020-10-14 LAB — GLUCOSE, CAPILLARY
Glucose-Capillary: 126 mg/dL — ABNORMAL HIGH (ref 70–99)
Glucose-Capillary: 127 mg/dL — ABNORMAL HIGH (ref 70–99)
Glucose-Capillary: 136 mg/dL — ABNORMAL HIGH (ref 70–99)
Glucose-Capillary: 178 mg/dL — ABNORMAL HIGH (ref 70–99)
Glucose-Capillary: 142 mg/dL — ABNORMAL HIGH (ref 70–99)
Glucose-Capillary: 114 mg/dL — ABNORMAL HIGH (ref 70–99)

## 2020-10-14 LAB — MAGNESIUM: Magnesium: 1.6 mg/dL — ABNORMAL LOW (ref 1.7–2.4)

## 2020-10-14 MED ORDER — MAGNESIUM SULFATE 2 GM/50ML IV SOLN
2.0000 g | Freq: Once | INTRAVENOUS | Status: AC
  Administered 2020-10-14: 2 g via INTRAVENOUS
  Filled 2020-10-14: qty 50

## 2020-10-14 NOTE — Consult Note (Signed)
Margaret Shepard, Margaret Shepard [id number] 1961-09-05 Erin Fulling, MD  Reason for Consult: Tracheostomy  HPI: The patient has been intubated now for approximately 3 weeks and is failed vent weaning.  She needs a tracheostomy for removing the oral endotracheal tube and putting it directly in the neck to allow for further weaning.  This will stabilize her airway.  Alvira Philips is her cousin who is providing legal care for her and consents to the procedure.  Allergies:  Allergies  Allergen Reactions  . Hydrocodone-Acetaminophen Nausea And Vomiting and Itching    ROS: Review of systems normal other than 12 systems except per HPI.  PMH:  Past Medical History:  Diagnosis Date  . Anemia    noted during hip surgery   . Anxiety    states no longer issue  . Frequent headaches   . Hypertension   . Lumbago   . Seizures (HCC)    after brain surgery, no longer has these    FH:  Family History  Problem Relation Age of Onset  . Arthritis Mother   . COPD Mother   . Hyperlipidemia Mother   . Kidney disease Mother   . Alcohol abuse Father   . COPD Sister   . Stroke Sister   . Hypertension Sister   . Cancer Maternal Grandmother     SH:  Social History   Socioeconomic History  . Marital status: Divorced    Spouse name: Not on file  . Number of children: 0  . Years of education: Not on file  . Highest education level: Not on file  Occupational History  . Not on file  Tobacco Use  . Smoking status: Former Games developer  . Smokeless tobacco: Never Used  . Tobacco comment: 8-9 years ago  Vaping Use  . Vaping Use: Never used  Substance and Sexual Activity  . Alcohol use: Yes    Alcohol/week: 0.0 standard drinks    Comment: socially  . Drug use: No  . Sexual activity: Not Currently  Other Topics Concern  . Not on file  Social History Narrative  . Not on file   Social Determinants of Health   Financial Resource Strain: Not on file  Food Insecurity: Not on file  Transportation Needs: Not on  file  Physical Activity: Not on file  Stress: Not on file  Social Connections: Not on file  Intimate Partner Violence: Not on file    PSH:  Past Surgical History:  Procedure Laterality Date  . BRAIN SURGERY    . JOINT REPLACEMENT Bilateral   . JOINT REPLACEMENT Left    revision  . MANDIBLE SURGERY      Physical  Exam: The patient is sedated and has an orotracheal tube in.  She does not have any significant swelling her neck.  The laryngeal landmarks are easily palpable.   A/P: Patient has prolonged intubation and has been unable to be weaned from the vent.  She is post Covid pneumonia.  She is a good candidate for tracheostomy.  This is scheduled for 1030 tomorrow morning.  We will hold her heparin tonight and make sure that she is not getting any tube feedings after midnight.   Beverly Sessions Tredarius Cobern 10/14/2020 8:40 AM

## 2020-10-14 NOTE — Progress Notes (Signed)
Patient scheduled for trach 10/12/2020 at 10:30. Discontinue heparin and tube feeds at midnight. Patient remains on sedation with intermit agitation when stimulated. Opens eyes to pain, unable to follow commands. Tolerating tube feeds, foley intact with adequate urine output. Continue to assess.

## 2020-10-14 NOTE — Progress Notes (Signed)
Palliative:  HPI: 60 y.o. female  with past medical history of headache, seizures, anemia, hypertension, chronic back pain on chronic opioids, anxiety admitted on 10/02/2020 with COVID pneumonia. Hospitalization complicated by ongoing respiratory failure requiring prolonged vent support, renal failure (not needing dialysis), RUL PE/bilateral DVT. Tentative plans for tracheostomy placement.    I met today at Margaret Shepard's room. She is much unchanged since yesterday. She appears more comfortable and restful today. No concerns per RN.   I spoke today with her aunt, Margaret Shepard, today. Margaret Shepard has had further discussions with medical team and family. They have decided to pursue tracheostomy at this time to give her more time to see if she can recover from her acute illness. Margaret Shepard understands that this may not happen and they would not want Margaret Shepard to be kept alive long term with life support measures and poor quality of life. We did discuss that a shift to comfort can be made at any point in time that family feels that Margaret Shepard would not desire ongoing aggressive care if she is not improving. Margaret Shepard understands. Family continue to brace themselves for poor prognosis and potential transition to comfort care in the future but wish to give her more time to allow for any improvement which I feel is reasonable at this time.   All questions/concerns addressed. Emotional support provided.   Exam: Ill-appearing. Sedated on vent. Less dyssynchrony on vent. No grimacing. Tachycardia. Vent 40% FiO2 and tolerating.   Plan: - Plan for tracheostomy.  - Already partial code: intubation/BiPAP only.  - Family open to comfort measures in the future if her quality of life does not improve as Margaret Shepard was a very active and social person before this illness.   25 min  Vinie Sill, NP Palliative Medicine Team Pager [phone] (Please see [email or web address] for schedule) Team Phone [phone]    Greater than 50%  of  this time was spent counseling and coordinating care related to the above assessment and plan

## 2020-10-14 NOTE — Anesthesia Preprocedure Evaluation (Signed)
Anesthesia Evaluation  Patient identified by MRN, date of birth, ID band Patient unresponsive    Reviewed: Allergy & Precautions, H&P , NPO status , Patient's Chart, lab work & pertinent test results  History of Anesthesia Complications Negative for: history of anesthetic complications  Airway Mallampati: Intubated  TM Distance: >3 FB Neck ROM: limited    Dental  (+) Chipped, Poor Dentition   Pulmonary shortness of breath, pneumonia, former smoker,    + rhonchi  + decreased breath sounds+ wheezing      Cardiovascular hypertension, Normal cardiovascular exam     Neuro/Psych  Headaches, Seizures -,  PSYCHIATRIC DISORDERS    GI/Hepatic negative GI ROS, Neg liver ROS,   Endo/Other  negative endocrine ROS  Renal/GU      Musculoskeletal  (+) Arthritis ,   Abdominal   Peds  Hematology negative hematology ROS (+)   Anesthesia Other Findings Past Medical History: No date: Anemia     Comment:  noted during hip surgery  No date: Anxiety     Comment:  states no longer issue No date: Frequent headaches No date: Hypertension No date: Lumbago No date: Seizures (HCC)     Comment:  after brain surgery, no longer has these  Past Surgical History: No date: BRAIN SURGERY No date: JOINT REPLACEMENT; Bilateral No date: JOINT REPLACEMENT; Left     Comment:  revision No date: MANDIBLE SURGERY  BMI    Body Mass Index: 25.32 kg/m      Reproductive/Obstetrics negative OB ROS                             Anesthesia Physical Anesthesia Plan  ASA: IV  Anesthesia Plan: General ETT   Post-op Pain Management:    Induction: Intravenous  PONV Risk Score and Plan: Ondansetron, Dexamethasone, Midazolam and Treatment may vary due to age or medical condition  Airway Management Planned: Oral ETT  Additional Equipment:   Intra-op Plan:   Post-operative Plan: Post-operative  intubation/ventilation  Informed Consent: I have reviewed the patients History and Physical, chart, labs and discussed the procedure including the risks, benefits and alternatives for the proposed anesthesia with the patient or authorized representative who has indicated his/her understanding and acceptance.     Dental Advisory Given  Plan Discussed with: Anesthesiologist, CRNA and Surgeon  Anesthesia Plan Comments: (History and phone consent from the patients aunt, who is acting as POA, Tomasa Blase at  [phone]   Aunt consented for risks of anesthesia including but not limited to:  - adverse reactions to medications - damage to eyes, teeth, lips or other oral mucosa - nerve damage due to positioning  - sore throat or hoarseness - Damage to heart, brain, nerves, lungs, other parts of body or loss of life  She voiced understanding.)        Anesthesia Quick Evaluation

## 2020-10-14 NOTE — Progress Notes (Signed)
CRITICAL CARE NOTE 60 yo female admitted with acute hypoxic respiratory failure secondary to pneumonia due to COVID-19 requiring HHFNC and NRB now intubated and severe resp failure complicated by DVT and PE  History of Present Illness:  This is a 60 yo unvaccinated female who presented to [facility] ER on 12/16 via EMS with c/o worsening shortness of breath, cough, nausea, diarrhea, and poor po intake.  Per ER notes pt tested positive for COVID-19 8 days prior to ER presentation.  EMS reported pt severely hypoxic with O2 sats less than 50% on RA.  Therefore she was placed on 15L NRB with O2 sats increasing to 81%.  Upon arrival to the ER pt transitioned to [facility] and NRB due to continued hypoxia and tachypnea.  Lab results revealed CO2 19, glucose 174, anon gap 18, LDH 646, troponin 260, ferritin 478, pct <0.10, lactic acid 4.9, BNP 294, and vbg pH 7.41/pCO2 37/bicarb 23.5.  COVID-19 positive and CXR concerning for multifocal pneumonia.  There was also concern of possible pulmonary embolism, however due to severe hypoxia pt unable to tolerate transfer for CTA Chest.  Therefore, heparin gtt initiated.  Pt also received aspirin, decadron, ibuprofen, zofran, and remdesivir.  She was subsequently admitted to ICU per PCCM team for additional workup and treatment, but remained in the ER pending ICU bed availability.   Significant Hospital Events:  12/16: Pt admitted to ICU with COVID-19 pneumonia requiring HHFNC and NRB remained in the ER pending ICU bed availability  12/17 high risk for cardiac arrest, on pressors 12/18 Off pressors 12/19 worsening renal function. Nephrology consult 09/28/20- patient was noted to be severely acidemic with HAGMA in am s/p 2 amp bicarb with repeat ABG significantly improved 12/21-patient will require HD, will place vascath today. Meds reviewed and changed, TG elevated on propofol. DCd IVF. 12/22- Patient remains critically ill. Reviewed plan with nephrologist, patient will not  have HD today. 10/01/20-patient on 35%FIo2 8peep, she diruesed >3L we will hold HD, continue diuresis. SBT.  10/02/20-patient with tachyarrythmia overnight. S/p cardiology evaluation. For SBT today.  10/03/20-patient with no overnight events, remains critically ill on MV. Diuresed well overnight. Discussed care plan with nephrologist today.  10/04/20- Patient remains critically ill. She had SBT daily and has not been able to pass yet.  10/05/20-with no acute events overnight remains on PEEP of 8 and FiO2 of 55% 10/06/20-Patient on peep of 8 and FiO2 60%. Trying to weansedation but very difficult in spite of using oral medications 10/07/20-patient tolerated 5/5 trial for over 30 minutes. She was able to follow commands today. She was able to wiggle her right toes. She was able to stick out her tongue. 10/08/2020-Lightly sedated, able to follow some simple commands. Currently on 50% FiO2 &8 PEEP, increased work of breathing on vent. Consult ENT for Trach. 10/09/2020- Pt very asynchronous with vent, increasing sedation with prn Paralytics.  Begin Recruitment maneuvers and Diurese today;  ENT evaluated pt, tentative plan for trach some time next week 10/10/2020-switch to [facility] adjusted ventilator settings 10/11/2020-ongoing issues with ventilator asynchrony no matter what mode utilized.  Requiring intermittent paralytics.  10/13/19- updated Geraldine (POA). Plan for palliative care evaluation. Patient still critically ill after 17d. Poor prognosis  10/14/19-  Patient continues to be critically ill. I spoke with [name] and we discussed tracheostomy.  She met with palliative care.  I spoke with Dr Kathyrn Sheriff ENT and plan is for trache on Thursday.    Consults:  [place]  Neph[facility] ENT  Procedures:  12/17: Endotracheal  intubation 12/21: Left IJ Trialysis catheter inserted  Significant Diagnostic Tests:  12/16: CXR revealed multifocal pneumonia  12/17: Venous US Bilateral  LE>>Positive for BILATERAL lower extremity DVT, more extensive on the Left. 12/17: CTA Chest>>Small pulmonary embolism right upper lobe.Diffuse bilateral airspace disease compatible with COVID pneumonia. Bibasilar atelectasis. 12/19: Renal US>>1. Increased echogenicity of bilateral kidneys as can be seen in medical renal disease. No hydronephrosis.  Micro Data:  COVID-19 12/16>>positive  Influenza PCR 12/16>>negative  Urine 12/16>> no growth Blood x2 12/16>>STAPHYLOCOCCUS HOMINIS  Blood 12/15>> no growth MRSA PCR 12/17>>negative  Antimicrobials:  Remdesivir 12/16>>12/20 Azithromycin 12/16>>12/17 Rocephin 12/16>>12/17 Vancomycin 12/18>>12/19 Cefepime 12/26 (plan for 5 days)>>    CC  follow up respiratory failure  SUBJECTIVE Patient remains critically ill Prognosis is guarded   Vent Mode: PRVC FiO2 (%):  [45 %-50 %] 45 % Set Rate:  [30 bmp] 30 bmp Vt Set:  [450 mL] 450 mL PEEP:  [8 cmH20] 8 cmH20 Plateau Pressure:  [20 cmH20] 20 cmH20  CBC    Component Value Date/Time   WBC 10.0 10/14/2020 0701   RBC 2.65 (L) 10/14/2020 0701   HGB 7.9 (L) 10/14/2020 0701   HGB 13.6 07/08/2012 2019   HCT 24.7 (L) 10/14/2020 0701   HCT 39.4 07/08/2012 2019   PLT 208 10/14/2020 0701   PLT 202 07/08/2012 2019   MCV 93.2 10/14/2020 0701   MCV 91 07/08/2012 2019   MCH 29.8 10/14/2020 0701   MCHC 32.0 10/14/2020 0701   RDW 15.5 10/14/2020 0701   RDW 12.6 07/08/2012 2019   LYMPHSABS 1.3 09/26/2020 0426   MONOABS 0.7 09/26/2020 0426   EOSABS 0.0 09/26/2020 0426   BASOSABS 0.0 09/26/2020 0426   BMP Latest Ref Rng & Units 10/13/2020 10/12/2020 10/11/2020  Glucose 70 - 99 mg/dL 124(H) 148(H) 116(H)  BUN 6 - 20 mg/dL 73(H) 79(H) 77(H)  Creatinine 0.44 - 1.00 mg/dL 1.55(H) 1.75(H) 1.77(H)  Sodium 135 - 145 mmol/L 141 142 144  Potassium 3.5 - 5.1 mmol/L 4.6 5.0 5.3(H)  Chloride 98 - 111 mmol/L 99 103 101  CO2 22 - 32 mmol/L 32 32 32  Calcium 8.9 - 10.3 mg/dL 8.5(L) 8.3(L) 8.5(L)      BP (!) 191/98   Pulse 92   Temp 97.88 F (36.6 C)   Resp 15   Ht _0  (1.626 m)   Wt 66.9 kg   SpO2 96%   BMI 25.32 kg/m    I/O last 3 completed shifts: In: 475.1 [I.V.:475.1] Out: 3975 [Urine:3975] No intake/output data recorded.  SpO2: 96 % O2 Flow Rate (L/min): 15 L/min FiO2 (%): 45 %  Estimated body mass index is 25.32 kg/m as calculated from the following:   Height as of this encounter: _1  (1.626 m).   Weight as of this encounter: 66.9 kg.  SIGNIFICANT EVENTS   REVIEW OF SYSTEMS  PATIENT IS UNABLE TO PROVIDE COMPLETE REVIEW OF SYSTEMS DUE TO SEVERE CRITICAL ILLNESS   Pressure Injury 10/09/20 Buttocks Left;Posterior;Proximal Stage 2 -  Partial thickness loss of dermis presenting as a shallow open injury with a red, pink wound bed without slough. gluetal fold (Active)  10/09/20 0810  Location: Buttocks  Location Orientation: Left;Posterior;Proximal  Staging: Stage 2 -  Partial thickness loss of dermis presenting as a shallow open injury with a red, pink wound bed without slough.  Wound Description (Comments): gluetal fold  Present on Admission:     COVID-19 DISASTER DECLARATION:   FULL CONTACT PHYSICAL EXAMINATION WAS NOT POSSIBLE DUE  TO TREATMENT OF COVID-19  AND CONSERVATION OF PERSONAL PROTECTIVE EQUIPMENT, LIMITED EXAM FINDINGS INCLUDE-   PHYSICAL EXAMINATION:  GENERAL:critically ill appearing, +resp distress NEUROLOGIC: obtunded, GCS<8   Patient assessed or the symptoms described in the history of present illness.  In the context of the Global COVID-19 pandemic, which necessitated consideration that the patient might be at risk for infection with the SARS-CoV-2 virus that causes COVID-19, Institutional protocols and algorithms that pertain to the evaluation of patients at risk for COVID-19 are in a state of rapid change based on information released by regulatory bodies including the CDC and federal and state organizations. These policies and  algorithms were followed during the patient's care while in hospital.    MEDICATIONS: I have reviewed all medications and confirmed regimen as documented   CULTURE RESULTS   Recent Results (from the past 240 hour(s))  CULTURE, BLOOD (ROUTINE X 2) w Reflex to ID Panel     Status: None (Preliminary result)   Collection Time: 10/12/20  9:46 AM   Specimen: BLOOD RIGHT HAND  Result Value Ref Range Status   Specimen Description BLOOD RIGHT HAND  Final   Special Requests   Final    BOTTLES DRAWN AEROBIC AND ANAEROBIC Blood Culture adequate volume   Culture   Final    NO GROWTH 2 DAYS Performed at [facility], [address], [place], [place] [address]    Report Status PENDING  Incomplete  CULTURE, BLOOD (ROUTINE X 2) w Reflex to ID Panel     Status: None (Preliminary result)   Collection Time: 10/12/20  9:46 AM   Specimen: BLOOD RIGHT ARM  Result Value Ref Range Status   Specimen Description BLOOD RIGHT ARM  Final   Special Requests   Final    BOTTLES DRAWN AEROBIC AND ANAEROBIC Blood Culture adequate volume   Culture   Final    NO GROWTH 2 DAYS Performed at [facility], [address] [place], [place] [address]    Report Status PENDING  Incomplete          IMAGING    No results found.   Nutrition Status: Nutrition Problem: Inadequate oral intake Etiology: inability to eat Signs/Symptoms: NPO status Interventions: Tube feeding,Prostat,MVI     Indwelling Urinary Catheter continued, requirement due to   Reason to continue Indwelling Urinary Catheter strict Intake/Output monitoring for hemodynamic instability   Central Line/ continued, requirement due to  Reason to continue [place] of central venous pressure or other hemodynamic parameters and poor IV access   Ventilator continued, requirement due to severe respiratory failure   Ventilator Sedation RASS 0 to -2      ASSESSMENT AND PLAN SYNOPSIS  Acute hypoxemic  respiratory failure due to COVID-19 pneumonia / ARDS Acute hypoxic respiratory failure secondary to COVID-19 pneumonia with ARDS, and small Right Upper Lobe PE  Mechanical ventilation via ARDS protocol, target PRVC 6 cc/kg Wean PEEP and FiO2 as able Goal plateau pressure less than 30, driving pressure less than 15 Paralytics if necessary for vent synchrony, gas exchange Diuresis as blood pressure and renal function can tolerate, goal CVP 5-8.   diuresis as tolerated based on Kidney function VAP prevention order set Remdesivir  IV STEROIDS  ENT  Plans for [facility] this week   Severe ACUTE Hypoxic and Hypercapnic Respiratory Failure -continue Full MV support -continue Bronchodilator Therapy -Wean Fio2 and PEEP as tolerated -will perform SAT/SBT when respiratory parameters are met -VAP/VENT bundle implementation  ACUTE SYSTOLIC CARDIAC FAILURE-  EF -oxygen as needed -Lasix as tolerated -follow up cardiac enzymes as indicated -follow up cardiology recs   obesity, possible OSA.   Will certainly impact respiratory mechanics, ventilator weaning Suspect will need to consider additional PEEP   ACUTE KIDNEY INJURY/Renal Failure -continue Foley Catheter-assess need -Avoid nephrotoxic agents -Follow urine output, BMP -Ensure adequate renal perfusion, optimize oxygenation -Renal dose medications    NEUROLOGY Acute toxic metabolic encephalopathy, need for sedation Goal RASS -2 to -3  CARDIAC ICU monitoring  ID -continue IV abx as prescibed -follow up cultures  GI GI PROPHYLAXIS as indicated   DIET-->TF's as tolerated Constipation protocol as indicated  ENDO - will use ICU hypoglycemic\Hyperglycemia protocol if indicated     ELECTROLYTES -follow labs as needed -replace as needed -pharmacy consultation and following   DVT/GI PRX ordered and assessed TRANSFUSIONS AS NEEDED MONITOR FSBS I Assessed the need for Labs I Assessed the need for Foley I Assessed the  need for Central Venous Line Family Discussion when available I Assessed the need for Mobilization I made an Assessment of medications to be adjusted accordingly Safety Risk assessment completed   CASE DISCUSSED IN MULTIDISCIPLINARY ROUNDS WITH ICU TEAM  Critical Care Time devoted to patient care services described in this note is 46 minutes.   Overall, patient is critically ill, prognosis is guarded.  Patient with Multiorgan failure and at high risk for cardiac arrest and death.    Corrin Parker, M.D.  [facility]  Medical Director [place] Director [facility] Cardio-Pulmonary Department

## 2020-10-15 ENCOUNTER — Inpatient Hospital Stay: Payer: Medicaid Other | Admitting: Anesthesiology

## 2020-10-15 ENCOUNTER — Inpatient Hospital Stay: Payer: Medicaid Other

## 2020-10-15 ENCOUNTER — Encounter: Admission: EM | Disposition: E | Payer: Self-pay | Source: Home / Self Care | Attending: Internal Medicine

## 2020-10-15 ENCOUNTER — Encounter: Payer: Self-pay | Admitting: Otolaryngology

## 2020-10-15 ENCOUNTER — Inpatient Hospital Stay: Payer: Self-pay

## 2020-10-15 DIAGNOSIS — J1282 Pneumonia due to coronavirus disease 2019: Secondary | ICD-10-CM | POA: Diagnosis not present

## 2020-10-15 DIAGNOSIS — J9601 Acute respiratory failure with hypoxia: Secondary | ICD-10-CM | POA: Diagnosis not present

## 2020-10-15 DIAGNOSIS — U071 COVID-19: Secondary | ICD-10-CM | POA: Diagnosis not present

## 2020-10-15 HISTORY — PX: TRACHEOSTOMY TUBE PLACEMENT: SHX814

## 2020-10-15 LAB — CBC
HCT: 24.2 % — ABNORMAL LOW (ref 36.0–46.0)
Hemoglobin: 7.4 g/dL — ABNORMAL LOW (ref 12.0–15.0)
MCH: 28.4 pg (ref 26.0–34.0)
MCHC: 30.6 g/dL (ref 30.0–36.0)
MCV: 92.7 fL (ref 80.0–100.0)
Platelets: 211 10*3/uL (ref 150–400)
RBC: 2.61 MIL/uL — ABNORMAL LOW (ref 3.87–5.11)
RDW: 15.3 % (ref 11.5–15.5)
WBC: 7.8 10*3/uL (ref 4.0–10.5)
nRBC: 0 % (ref 0.0–0.2)

## 2020-10-15 LAB — RENAL FUNCTION PANEL
Albumin: 2.2 g/dL — ABNORMAL LOW (ref 3.5–5.0)
Anion gap: 11 (ref 5–15)
BUN: 59 mg/dL — ABNORMAL HIGH (ref 6–20)
CO2: 33 mmol/L — ABNORMAL HIGH (ref 22–32)
Calcium: 8.8 mg/dL — ABNORMAL LOW (ref 8.9–10.3)
Chloride: 99 mmol/L (ref 98–111)
Creatinine, Ser: 1.3 mg/dL — ABNORMAL HIGH (ref 0.44–1.00)
GFR, Estimated: 47 mL/min — ABNORMAL LOW (ref >60)
Glucose, Bld: 86 mg/dL (ref 70–99)
Phosphorus: 3.6 mg/dL (ref 2.5–4.6)
Potassium: 4.5 mmol/L (ref 3.5–5.1)
Sodium: 143 mmol/L (ref 135–145)

## 2020-10-15 LAB — GLUCOSE, CAPILLARY
Glucose-Capillary: 80 mg/dL (ref 70–99)
Glucose-Capillary: 81 mg/dL (ref 70–99)
Glucose-Capillary: 123 mg/dL — ABNORMAL HIGH (ref 70–99)
Glucose-Capillary: 96 mg/dL (ref 70–99)
Glucose-Capillary: 130 mg/dL — ABNORMAL HIGH (ref 70–99)
Glucose-Capillary: 91 mg/dL (ref 70–99)

## 2020-10-15 LAB — MAGNESIUM: Magnesium: 2 mg/dL (ref 1.7–2.4)

## 2020-10-15 SURGERY — CREATION, TRACHEOSTOMY
Anesthesia: General

## 2020-10-15 MED ORDER — KETAMINE HCL 10 MG/ML IJ SOLN
INTRAMUSCULAR | Status: DC | PRN
  Administered 2020-10-15: 50 mg via INTRAVENOUS

## 2020-10-15 MED ORDER — ROCURONIUM BROMIDE 100 MG/10ML IV SOLN
INTRAVENOUS | Status: DC | PRN
  Administered 2020-10-15: 40 mg via INTRAVENOUS

## 2020-10-15 MED ORDER — LACTATED RINGERS IV SOLN: INTRAVENOUS | Status: DC | PRN

## 2020-10-15 MED ORDER — FENTANYL CITRATE (PF) 100 MCG/2ML IJ SOLN
INTRAMUSCULAR | Status: AC
  Filled 2020-10-15: qty 2

## 2020-10-15 MED ORDER — HEPARIN SODIUM (PORCINE) 5000 UNIT/ML IJ SOLN
5000.0000 [IU] | Freq: Three times a day (TID) | INTRAMUSCULAR | Status: DC
  Administered 2020-10-15 – 2020-10-16 (×3): 5000 [IU] via SUBCUTANEOUS
  Filled 2020-10-15 (×3): qty 1

## 2020-10-15 MED ORDER — KETAMINE HCL 50 MG/5ML IJ SOSY
PREFILLED_SYRINGE | INTRAMUSCULAR | Status: AC
  Filled 2020-10-15: qty 5

## 2020-10-15 MED ORDER — VITAL 1.5 CAL PO LIQD
1000.0000 mL | ORAL | Status: DC
  Administered 2020-10-15 – 2020-10-18 (×4): 1000 mL

## 2020-10-15 MED ORDER — ROCURONIUM BROMIDE 10 MG/ML (PF) SYRINGE
PREFILLED_SYRINGE | INTRAVENOUS | Status: AC
  Filled 2020-10-15: qty 10

## 2020-10-15 MED ORDER — MIDAZOLAM HCL 2 MG/2ML IJ SOLN
INTRAMUSCULAR | Status: DC | PRN
  Administered 2020-10-15: 2 mg via INTRAVENOUS

## 2020-10-15 MED ORDER — MIDAZOLAM HCL 2 MG/2ML IJ SOLN
INTRAMUSCULAR | Status: AC
  Filled 2020-10-15: qty 2

## 2020-10-15 MED ORDER — FENTANYL CITRATE (PF) 100 MCG/2ML IJ SOLN
INTRAMUSCULAR | Status: DC | PRN
  Administered 2020-10-15 (×2): 50 ug via INTRAVENOUS

## 2020-10-15 MED ORDER — LIDOCAINE-EPINEPHRINE (PF) 1 %-1:200000 IJ SOLN
INTRAMUSCULAR | Status: DC | PRN
  Administered 2020-10-15: 2 mL

## 2020-10-15 SURGICAL SUPPLY — 29 items
BLADE SURG 15 STRL LF DISP TIS (BLADE) ×1 IMPLANT
BLADE SURG 15 STRL SS (BLADE) ×2
BLADE SURG SZ11 CARB STEEL (BLADE) ×2 IMPLANT
CANISTER SUCT 1200ML W/VALVE (MISCELLANEOUS) ×2 IMPLANT
COVER WAND RF STERILE (DRAPES) ×2 IMPLANT
ELECT REM PT RETURN 9FT ADLT (ELECTROSURGICAL) ×2
ELECTRODE REM PT RTRN 9FT ADLT (ELECTROSURGICAL) ×1 IMPLANT
GLOVE PROTEXIS LATEX SZ 7.5 (GLOVE) ×2 IMPLANT
GLOVE SURG LATEX 7.5 PF (GLOVE) ×1 IMPLANT
GOWN STRL REUS W/ TWL LRG LVL3 (GOWN DISPOSABLE) ×2 IMPLANT
GOWN STRL REUS W/TWL LRG LVL3 (GOWN DISPOSABLE) ×4
HLDR TRACH TUBE NECKBAND 18 (MISCELLANEOUS) ×1 IMPLANT
HOLDER TRACH TUBE NECKBAND 18 (MISCELLANEOUS) ×2
MANIFOLD NEPTUNE II (INSTRUMENTS) ×2 IMPLANT
NS IRRIG 500ML POUR BTL (IV SOLUTION) ×2 IMPLANT
PACK HEAD/NECK (MISCELLANEOUS) ×2 IMPLANT
SHEARS HARMONIC 9CM CVD (BLADE) ×2 IMPLANT
SPONGE DRAIN TRACH 4X4 STRL 2S (GAUZE/BANDAGES/DRESSINGS) ×2 IMPLANT
SUCTION FRAZIER HANDLE 10FR (MISCELLANEOUS) ×2
SUCTION TUBE FRAZIER 10FR DISP (MISCELLANEOUS) ×1 IMPLANT
SUT ETHILON 2 0 FS 18 (SUTURE) ×2 IMPLANT
SUT SILK 2 0 (SUTURE)
SUT SILK 2-0 18XBRD TIE 12 (SUTURE) IMPLANT
SUT VIC AB 4-0 RB1 27 (SUTURE) ×2
SUT VIC AB 4-0 RB1 27X BRD (SUTURE) ×1 IMPLANT
TUBE TRACH  6.0 CUFF FLEX (MISCELLANEOUS) ×2
TUBE TRACH 6.0 CUFF FLEX (MISCELLANEOUS) IMPLANT
TUBE TRACH SHILEY  6 DIST  CUF (TUBING) IMPLANT
TUBE TRACH SHILEY 8 DIST CUF (TUBING) IMPLANT

## 2020-10-15 NOTE — Progress Notes (Signed)
CRITICAL CARE NOTE 60 yo female admitted with acute hypoxic respiratory failure secondary to pneumonia due to COVID-19 requiring HHFNC and NRB now intubated and severe resp failure complicated by DVT and PE  History of Present Illness:  This is a 60 yo unvaccinated female who presented to [facility] ER on 12/16 via EMS with c/o worsening shortness of breath, cough, nausea, diarrhea, and poor po intake. Per ER notes pt tested positive for COVID-19 8 days prior to ER presentation. EMS reported pt severely hypoxic with O2 sats less than 50% on RA. Therefore she was placed on 15L NRB with O2 sats increasing to 81%. Upon arrival to the ER pt transitioned to [facility] and NRB due to continued hypoxia and tachypnea. Lab results revealed CO2 19, glucose 174, anon gap 18, LDH 646, troponin 260, ferritin 478, pct <0.10, lactic acid 4.9, BNP 294, and vbg pH 7.41/pCO2 37/bicarb 23.5. COVID-19 positive and CXR concerning for multifocal pneumonia. There was also concern of possible pulmonary embolism, however due to severe hypoxia pt unable to tolerate transfer for CTA Chest. Therefore, heparin gtt initiated. Pt also received aspirin, decadron, ibuprofen, zofran, and remdesivir. She was subsequently admitted to ICU per PCCM team for additional workup and treatment, but remained in the ER pending ICU bed availability.   Significant Hospital Events:  12/16: Pt admitted to ICU with COVID-19 pneumonia requiring HHFNC and NRB remained in the ER pending ICU bed availability  12/17 high risk for cardiac arrest, on pressors 12/18 Off pressors 12/19 worsening renal function. Nephrology consult 09/28/20- patient was noted to be severely acidemic with HAGMA in am s/p 2 amp bicarb with repeat ABG significantly improved 12/21-patient will require HD, will place vascath today. Meds reviewed and changed, TG elevated on propofol. DCd IVF. 12/22- Patient remains critically ill. Reviewed plan with nephrologist, patient will not  have HD today. 10/01/20-patient on 35%FIo2 8peep, she diruesed >3L we will hold HD, continue diuresis. SBT.  10/02/20-patient with tachyarrythmia overnight. S/p cardiology evaluation. For SBT today.  10/03/20-patient with no overnight events, remains critically ill on MV. Diuresed well overnight. Discussed care plan with nephrologist today.  10/04/20- Patient remains critically ill. She had SBT daily and has not been able to pass yet.  10/05/20-with no acute events overnight remains on PEEP of 8 and FiO2 of 55% 10/06/20-Patient on peep of 8 and FiO2 60%. Trying to weansedation but very difficult in spite of using oral medications 10/07/20-patient tolerated 5/5 trial for over 30 minutes. She was able to follow commands today. She was able to wiggle her right toes. She was able to stick out her tongue. 10/08/2020-Lightly sedated, able to follow some simple commands. Currently on 50% FiO2 &8 PEEP, increased work of breathing on vent. Consult ENT for Trach. 10/09/2020-Pt very asynchronous with vent, increasing sedation with prn Paralytics. Begin Recruitment maneuvers and Diurese today; ENT evaluated pt, tentative plan for trach some time next week 10/10/2020-switch to [facility], adjusted ventilator settings 10/11/2020-ongoing issues with ventilator asynchrony no matter what mode utilized. Requiring intermittent paralytics.  10/13/19-updated [name] (POA). Plan for palliative care evaluation. Patient still critically ill after 17d. Poor prognosis  10/14/19- Patient continues to be critically ill. I spoke with [name] and we discussed tracheostomy. She met with palliative care 1/5 failure to wean from vent     CC  follow up respiratory failure  SUBJECTIVE Patient remains critically ill Prognosis is guarded Plan for [facility] today   BP 114/71   Pulse (!) 101   Temp 98.24 F (36.8 C)  Resp (!) 30   Ht _0  (1.626 m)   Wt 63.2 kg   SpO2 94%   BMI 23.92 kg/m    I/O  last 3 completed shifts: In: 983.5 [I.V.:983.5] Out: 5830 [Urine:3475] No intake/output data recorded.  SpO2: 94 % O2 Flow Rate (L/min): 15 L/min FiO2 (%): 50 %  Estimated body mass index is 23.92 kg/m as calculated from the following:   Height as of this encounter: _1  (1.626 m).   Weight as of this encounter: 63.2 kg.  SIGNIFICANT EVENTS   REVIEW OF SYSTEMS  PATIENT IS UNABLE TO PROVIDE COMPLETE REVIEW OF SYSTEMS DUE TO SEVERE CRITICAL ILLNESS   Pressure Injury 10/09/20 Buttocks Left;Posterior;Proximal Stage 2 -  Partial thickness loss of dermis presenting as a shallow open injury with a red, pink wound bed without slough. gluetal fold (Active)  10/09/20 0810  Location: Buttocks  Location Orientation: Left;Posterior;Proximal  Staging: Stage 2 -  Partial thickness loss of dermis presenting as a shallow open injury with a red, pink wound bed without slough.  Wound Description (Comments): gluetal fold  Present on Admission:       PHYSICAL EXAMINATION:  GENERAL:critically ill appearing, +resp distress HEAD: Normocephalic, atraumatic.  EYES: Pupils equal, round, reactive to light.  No scleral icterus.  MOUTH: Moist mucosal membrane. NECK: Supple.  PULMONARY: +rhonchi, +wheezing CARDIOVASCULAR: S1 and S2. Regular rate and rhythm. No murmurs, rubs, or gallops.  GASTROINTESTINAL: Soft, nontender, -distended.  Positive bowel sounds.   MUSCULOSKELETAL: No swelling, clubbing, or edema.  NEUROLOGIC: obtunded, GCS<8 SKIN:intact,warm,dry  MEDICATIONS: I have reviewed all medications and confirmed regimen as documented   CULTURE RESULTS   Recent Results (from the past 240 hour(s))  CULTURE, BLOOD (ROUTINE X 2) w Reflex to ID Panel     Status: None (Preliminary result)   Collection Time: 10/12/20  9:46 AM   Specimen: BLOOD RIGHT HAND  Result Value Ref Range Status   Specimen Description BLOOD RIGHT HAND  Final   Special Requests   Final    BOTTLES DRAWN AEROBIC AND  ANAEROBIC Blood Culture adequate volume   Culture   Final    NO GROWTH 3 DAYS Performed at [facility], [address] [place], [place] [address]    Report Status PENDING  Incomplete  CULTURE, BLOOD (ROUTINE X 2) w Reflex to ID Panel     Status: None (Preliminary result)   Collection Time: 10/12/20  9:46 AM   Specimen: BLOOD RIGHT ARM  Result Value Ref Range Status   Specimen Description BLOOD RIGHT ARM  Final   Special Requests   Final    BOTTLES DRAWN AEROBIC AND ANAEROBIC Blood Culture adequate volume   Culture   Final    NO GROWTH 3 DAYS Performed at [facility], [address] [place], [place] [address]    Report Status PENDING  Incomplete          IMAGING    No results found.   Nutrition Status: Nutrition Problem: Inadequate oral intake Etiology: inability to eat Signs/Symptoms: NPO status Interventions: Tube feeding,Prostat,MVI     Indwelling Urinary Catheter continued, requirement due to   Reason to continue Indwelling Urinary Catheter strict Intake/Output monitoring for hemodynamic instability   Central Line/ continued, requirement due to  Reason to continue [place] of central venous pressure or other hemodynamic parameters and poor IV access   Ventilator continued, requirement due to severe respiratory failure   Ventilator Sedation RASS 0 to -2  ASSESSMENT AND PLAN SYNOPSIS  Acute hypoxemic respiratory failure due to COVID-19 pneumonia failure to wean from vent due to ARDS Acute hypoxic respiratory failure secondary to COVID-19 pneumonia with ARDS, and small Right Upper Lobe PE  Severe ACUTE Hypoxic and Hypercapnic Respiratory Failure -continue Full MV support -continue Bronchodilator Therapy -Wean Fio2 and PEEP as tolerated -VAP/VENT bundle implementation Plan for [facility] today  ACUTE DIASTOLIC CARDIAC FAILURE-  -oxygen as needed -Lasix as tolerated  Morbid obesity, possible OSA.   Will certainly  impact respiratory mechanics, Plan for [facility] today   ACUTE KIDNEY INJURY/Renal Failure -continue Foley Catheter-assess need -Avoid nephrotoxic agents -Follow urine output, BMP -Ensure adequate renal perfusion, optimize oxygenation -Renal dose medications     NEUROLOGY - intubated and sedated - minimal sedation to achieve a RASS goal: -1   CARDIAC ICU monitoring  ID -continue IV abx as prescibed -follow up cultures  GI GI PROPHYLAXIS as indicated  NUTRITIONAL STATUS Nutrition Status: Nutrition Problem: Inadequate oral intake Etiology: inability to eat Signs/Symptoms: NPO status Interventions: Tube feeding,Prostat,MVI   DIET-->NPO Constipation protocol as indicated  ENDO - will use ICU hypoglycemic\Hyperglycemia protocol if indicated     ELECTROLYTES -follow labs as needed -replace as needed -pharmacy consultation and following   DVT/GI PRX ordered and assessed TRANSFUSIONS AS NEEDED MONITOR FSBS I Assessed the need for Labs I Assessed the need for Foley I Assessed the need for Central Venous Line Family Discussion when available I Assessed the need for Mobilization I made an Assessment of medications to be adjusted accordingly Safety Risk assessment completed   CASE DISCUSSED IN MULTIDISCIPLINARY ROUNDS WITH ICU TEAM  Critical Care Time devoted to patient care services described in this note is 45 minutes.   Overall, patient is critically ill, prognosis is guarded.      Corrin Parker, M.D.  [facility]  Medical Director [place] Director [facility] Cardio-Pulmonary Department

## 2020-10-15 NOTE — Anesthesia Postprocedure Evaluation (Signed)
Anesthesia Post Note  Patient: Margaret Shepard  Procedure(s) Performed: TRACHEOSTOMY (N/A )  Patient location during evaluation: SICU Anesthesia Type: General Level of consciousness: sedated Pain management: pain level controlled Vital Signs Assessment: post-procedure vital signs reviewed and stable Respiratory status: patient remains intubated per anesthesia plan Cardiovascular status: stable Postop Assessment: no apparent nausea or vomiting Anesthetic complications: no   No complications documented.   Last Vitals:  Vitals:   10/14/2020 1300 10/12/2020 1336  BP: (!) 141/84   Pulse: (!) 102   Resp: (!) 30   Temp:    SpO2: 100% 98%    Last Pain:  Vitals:   11/03/2020 0400  TempSrc: Esophageal  PainSc:                  Christia Reading

## 2020-10-15 NOTE — Progress Notes (Signed)
Patient trach, Shiley size 6 performed this am. Tube feeds restarted. Foley intact with adequate urine output. Morphine given once. PICC order entered per Dr Belia Heman.

## 2020-10-15 NOTE — Transfer of Care (Signed)
Immediate Anesthesia Transfer of Care Note  Patient: Margaret Shepard  Procedure(s) Performed: TRACHEOSTOMY (N/A )  Patient Location: ICU  Anesthesia Type:General  Level of Consciousness: sedated and Patient remains intubated per anesthesia plan  Airway & Oxygen Therapy: Patient remains intubated per anesthesia plan , on ventilator  Post-op Assessment: Report given to RN and Post -op Vital signs reviewed and stable  Post vital signs: Reviewed and stable  Last Vitals: see Epic record Vitals Value Taken Time  BP    Temp    Pulse    Resp    SpO2      Last Pain:  Vitals:   10/23/2020 0400  TempSrc: Esophageal  PainSc:          Complications: No complications documented.

## 2020-10-15 NOTE — Progress Notes (Signed)
Nutrition Follow Up Note   DOCUMENTATION CODES:   Not applicable  INTERVENTION:   Continue Vital 1.5 @ 15m/hr + Pro-Source 475mdaily via tube  Free water flushes 20057m6 hours per MD  Regimen provides 2020kcal/day, 100g/day protein and 1808m35my free water   Provide MVI tablet once daily per tube   NUTRITION DIAGNOSIS:   Inadequate oral intake related to inability to eat as evidenced by NPO status.  GOAL:   Patient will meet greater than or equal to 90% of their needs  -met with tube feeds   MONITOR:   Vent status,Labs,Weight trends,TF tolerance,I & O's  ASSESSMENT:   59 y79r old female with PMHx of HTN, seizures, anemia, anxiety admitted with COVIZCHYI-50 complicated by DVT and probable acute PE.   Pt remains sedated and ventilated. OGT in place. Pt tolerating tube feeds at goal rate. Pt failed SBTs. Plan is for tracheostomy today.   Per chart, pt initially up ~ 25lbs since admit but weight is now trending back down towards her UBW.   Medications reviewed and include: vitamin C, pepcid, folic acid, insulin, MVI, oxycodone, veltassa, prednisone, thiamine, zinc, precedex, hydromorphone  Labs reviewed: K 4.5 wnl, BUN 59(H), creat 1.30(H), P 3.6 wnl, Mg 2.0 wnl Hgb 7.4(L), Hct 24.2(L) cbgs- 80, 81 x 24 hrs   Patient is currently intubated on ventilator support MV: 12.8 L/min Temp (24hrs), Avg:98.2 F (36.8 C), Min:97.52 F (36.4 C), Max:98.78 F (37.1 C)  MAP- >65mm98mUOP- 2650ml 42mt Order:   Diet Order            Diet NPO time specified  Diet effective midnight                EDUCATION NEEDS:   No education needs have been identified at this time  Skin:  Skin Assessment: Reviewed RN Assessment  Last BM:  1/5- type 4  Height:   Ht Readings from Last 1 Encounters:  10/08/20 '5\' 4"'  (1.626 m)   Weight:   Wt Readings from Last 1 Encounters:  10/14/2020 63.2 kg   Ideal Body Weight:  54.5 kg  BMI:  Body mass index is 23.92  kg/m.  Estimated Nutritional Needs:   Kcal:  2037kcal  Protein:  97-110 grams  Fluid:  1.9-2.2 L/day  Catha Ontko Koleen DistanceD, LDN Please refer to AMION [facility] and/or RD on-call/weekend/after hours pager

## 2020-10-15 NOTE — Op Note (Signed)
10/31/2020 11:03 AM  Barnie Del [id number]  Pre-Op Dx: Prolonged intubation due to COVID-pneumonia  Post-Op Dx: Same  Proc:  Tracheostomy  Surg:  Beverly Sessions Sacoya Mcgourty  Anes:  GOT  EBL: Minimal  Comp: None  Findings: The thyroid isthmus went across the upper trachea.  The trachea was entered between the second and third tracheal rings  Procedure:  The patient was brought from the intensive care unit to the operating room and transferred to an operating table.  Anesthesia was administered per indwelling orotracheal tube.   Neck extension was achieved as possible anda shoulder rolke was placed.  The lower neck was palpated with the findings as described above.  1% Xylocaine with 1:100,000 epinephrine, 5 cc's, was infiltrated into the surgical field for intraoperative hemostasis.  Several minutes were allowed for this to take effect. The patient was prepped in a sterile fashion with a surgical prep from the chin down to the upper chest.  Sterile draping was accomplished in the standard fashion.  A 2-1/2 cm horizontal incision was made sharply a finger's breadth above the sternal notch, and extended through skin and subcutaneous fat.  Using cautery, the superficial layer of the deep cervical fascia was lysed.  Additional dissection revealed the strap muscles.  The midline raphe was divided in two layers and the muscles retracted laterally.  The pretracheal plane was visualized.  This was entered bluntly.  The thyroid isthmus was isolated and divided with the Harmonic scalpel.  The thyroid gland was retracted to either side.  The anterior face of the trachea was cleared.  In the third interspace, a transverse incision was made between cartilage rings into the tracheal lumen.  A 6 mm wide inferiorly based flap was generated and secured to the lower wound with a 4-0 chromic suture.   A previously tested  #6/7.5 Shiley cuffed tracheostomy tube was brought into the field.  With the endotracheal tube  under direct visualization through the tracheostomy, it was gently backed up.  The tracheostomy tube was inserted into the tracheal lumen.  Hemostasis was observed. The cuff was inflated and observed to be intact and containing pressure. The inner cannula was placed and ventilation assumed per tracheostomy tube.  Good chest wall motion was observed, and CO2 was documented per anesthesia.  The trach tube was secured in the standard fashion with trach ties. A 2-0 Nylon suture was used to secure the trach tube to the skin on both sides.  Hemostasis was observed again.  When satisfactory ventilation was assured, the orotracheal tube was removed.  At this point the procedure was completed.  The patient was returned to anesthesia, awakened as possible, and transferred back to the intensive care unit in stable condition.  Comment: 60 y.o. white female with prolonged ventilation was the indication for today's procedure.  Anticipate a routine postoperative recovery including standard tracheal hygiene.  The sutures should be removed in 5 days.  When the patient no longer requires ventilator or pressure support, the cuff should be deflated.  Changing to an uncuffed tube and downsizing will be according to the clinical condition of the patient.   Beverly Sessions Bennett Ram  11:03 AM 2020-10-31

## 2020-10-15 NOTE — Progress Notes (Incomplete)
Palliative:  HPI: 60 y.o. female  with past medical history of CHF, s/p [employer] PPM/ICD, CAD, IDDM, CKD stage 3b, prostatomegaly admitted on 03/09/2022 with weakness x 3 days after fall followed with pain, difficulty to ambulate, decrease intake and urine output. Noted to have hypotension, leukocytosis related to discitis aspiration showing +Klebsiella along with UTI. Hospitalization complicated by cardiogenic shock EF <20%, grade 2 diastolic dysfunction, moderate pulmonary hypertension, renal failure, bladder outlet obstruction, paroxysmal atrial fibrillation. Ongoing significant fluid overload and poor response to diuresis. Trial large dose of Lasix while on dobutamine.   I met again today with Margaret Shepard along with his daughter and wife at bedside. Margaret Shepard continues to speak to his poor prognosis and is able to tell his family that he is very much at peace. He reviews that his vital signs have been good but he understands that his heart is "give out" - he says that his heart has served him well for many good years. He is a very spiritual man and reports that he had wonderful visits with chaplain Ellen. He is at peace and is prepared for transition to comfort care when his other daughter returns from [place]. Family at bedside report that they believed she would be back Thurs/Fri but she will not return until Monday July 3rd. Margaret Shepard was disheartened that she does not return until Monday but they all continue to report their goal to continue to maintain until she returns and then we will transition to full comfort care and allow natural and peaceful death.   I spoke with them again about my recommendation for deactivation of AICD and they all agree that this would be in his best interest at this time. I explained the process and will place order for [employer] to come to deactivate.   All questions/concerns addressed. Emotional support provided.   Exam: Alert, oriented. Frail.  Lying in bed. No distress. Generalized weakness and fatigue. HR 90-100s. Breathing regular, unlabored at rest. Abd soft. BLE edema.   Plan: - DNR - Deactivate AICD - Awaiting daughter's return from vacation to say goodbye and then will transition to full comfort care (she should be back Monday)  40 min  Margaret Nawaz, NP Palliative Medicine Team Pager [phone] (Please see [email or web address] for schedule) Team Phone [phone]    Greater than 50%  of this time was spent counseling and coordinating care related to the above assessment and plan   

## 2020-10-16 DIAGNOSIS — Z7189 Other specified counseling: Secondary | ICD-10-CM | POA: Diagnosis not present

## 2020-10-16 DIAGNOSIS — Z515 Encounter for palliative care: Secondary | ICD-10-CM | POA: Diagnosis not present

## 2020-10-16 DIAGNOSIS — U071 COVID-19: Secondary | ICD-10-CM | POA: Diagnosis not present

## 2020-10-16 DIAGNOSIS — R52 Pain, unspecified: Secondary | ICD-10-CM

## 2020-10-16 DIAGNOSIS — J1282 Pneumonia due to coronavirus disease 2019: Secondary | ICD-10-CM | POA: Diagnosis not present

## 2020-10-16 DIAGNOSIS — J9601 Acute respiratory failure with hypoxia: Secondary | ICD-10-CM | POA: Diagnosis not present

## 2020-10-16 LAB — RENAL FUNCTION PANEL
Albumin: 2.3 g/dL — ABNORMAL LOW (ref 3.5–5.0)
Anion gap: 11 (ref 5–15)
BUN: 50 mg/dL — ABNORMAL HIGH (ref 6–20)
CO2: 32 mmol/L (ref 22–32)
Calcium: 8.7 mg/dL — ABNORMAL LOW (ref 8.9–10.3)
Chloride: 103 mmol/L (ref 98–111)
Creatinine, Ser: 1.16 mg/dL — ABNORMAL HIGH (ref 0.44–1.00)
GFR, Estimated: 54 mL/min — ABNORMAL LOW (ref >60)
Glucose, Bld: 127 mg/dL — ABNORMAL HIGH (ref 70–99)
Phosphorus: 3.6 mg/dL (ref 2.5–4.6)
Potassium: 3.5 mmol/L (ref 3.5–5.1)
Sodium: 146 mmol/L — ABNORMAL HIGH (ref 135–145)

## 2020-10-16 LAB — GLUCOSE, CAPILLARY
Glucose-Capillary: 121 mg/dL — ABNORMAL HIGH (ref 70–99)
Glucose-Capillary: 119 mg/dL — ABNORMAL HIGH (ref 70–99)
Glucose-Capillary: 140 mg/dL — ABNORMAL HIGH (ref 70–99)
Glucose-Capillary: 179 mg/dL — ABNORMAL HIGH (ref 70–99)
Glucose-Capillary: 133 mg/dL — ABNORMAL HIGH (ref 70–99)
Glucose-Capillary: 121 mg/dL — ABNORMAL HIGH (ref 70–99)

## 2020-10-16 LAB — CBC
HCT: 26.1 % — ABNORMAL LOW (ref 36.0–46.0)
Hemoglobin: 8.3 g/dL — ABNORMAL LOW (ref 12.0–15.0)
MCH: 29.4 pg (ref 26.0–34.0)
MCHC: 31.8 g/dL (ref 30.0–36.0)
MCV: 92.6 fL (ref 80.0–100.0)
Platelets: 249 10*3/uL (ref 150–400)
RBC: 2.82 MIL/uL — ABNORMAL LOW (ref 3.87–5.11)
RDW: 15.3 % (ref 11.5–15.5)
WBC: 7.9 10*3/uL (ref 4.0–10.5)
nRBC: 0 % (ref 0.0–0.2)

## 2020-10-16 LAB — MAGNESIUM: Magnesium: 1.6 mg/dL — ABNORMAL LOW (ref 1.7–2.4)

## 2020-10-16 MED ORDER — PREDNISONE 10 MG PO TABS
20.0000 mg | ORAL_TABLET | Freq: Every day | ORAL | Status: AC
  Administered 2020-10-17 – 2020-10-20 (×4): 20 mg
  Filled 2020-10-16 (×4): qty 2

## 2020-10-16 MED ORDER — PREDNISONE 10 MG PO TABS
10.0000 mg | ORAL_TABLET | Freq: Every day | ORAL | Status: DC
  Administered 2020-10-21: 10 mg
  Filled 2020-10-16: qty 1

## 2020-10-16 MED ORDER — MAGNESIUM SULFATE 2 GM/50ML IV SOLN
2.0000 g | Freq: Once | INTRAVENOUS | Status: AC
  Administered 2020-10-16: 2 g via INTRAVENOUS
  Filled 2020-10-16: qty 50

## 2020-10-16 MED ORDER — PREDNISONE 10 MG PO TABS: 5.0000 mg | ORAL_TABLET | Freq: Every day | ORAL | Status: DC

## 2020-10-16 MED ORDER — SODIUM CHLORIDE 0.9% FLUSH: 10.0000 mL | INTRAVENOUS | Status: DC | PRN

## 2020-10-16 MED ORDER — CLONAZEPAM 1 MG PO TABS
2.0000 mg | ORAL_TABLET | Freq: Two times a day (BID) | ORAL | Status: DC
  Administered 2020-10-16 – 2020-10-21 (×10): 2 mg
  Filled 2020-10-16 (×11): qty 2

## 2020-10-16 MED ORDER — DEXMEDETOMIDINE HCL IN NACL 400 MCG/100ML IV SOLN
0.4000 ug/kg/h | INTRAVENOUS | Status: DC
  Administered 2020-10-16: 1.2 ug/kg/h via INTRAVENOUS
  Administered 2020-10-16: 0.4 ug/kg/h via INTRAVENOUS
  Administered 2020-10-17 (×4): 1.2 ug/kg/h via INTRAVENOUS
  Administered 2020-10-17: 0.8 ug/kg/h via INTRAVENOUS
  Administered 2020-10-18 – 2020-10-20 (×10): 1.2 ug/kg/h via INTRAVENOUS
  Administered 2020-10-20: 0.5 ug/kg/h via INTRAVENOUS
  Administered 2020-10-20 – 2020-10-21 (×4): 1.2 ug/kg/h via INTRAVENOUS
  Filled 2020-10-16 (×22): qty 100

## 2020-10-16 MED ORDER — APIXABAN 5 MG PO TABS: 5.0000 mg | ORAL_TABLET | Freq: Two times a day (BID) | ORAL | Status: DC

## 2020-10-16 MED ORDER — METOPROLOL TARTRATE 25 MG PO TABS
25.0000 mg | ORAL_TABLET | Freq: Two times a day (BID) | ORAL | Status: DC
  Administered 2020-10-16 – 2020-10-20 (×7): 25 mg
  Filled 2020-10-16 (×9): qty 1

## 2020-10-16 MED ORDER — QUETIAPINE FUMARATE 25 MG PO TABS
50.0000 mg | ORAL_TABLET | Freq: Two times a day (BID) | ORAL | Status: DC
  Administered 2020-10-16 – 2020-10-20 (×8): 50 mg
  Filled 2020-10-16 (×8): qty 2

## 2020-10-16 MED ORDER — APIXABAN 5 MG PO TABS
10.0000 mg | ORAL_TABLET | Freq: Once | ORAL | Status: AC
  Administered 2020-10-16: 10 mg
  Filled 2020-10-16: qty 2

## 2020-10-16 MED ORDER — OXYCODONE HCL 5 MG PO TABS
15.0000 mg | ORAL_TABLET | Freq: Four times a day (QID) | ORAL | Status: DC
  Administered 2020-10-16 – 2020-10-21 (×19): 15 mg
  Filled 2020-10-16 (×20): qty 3

## 2020-10-16 MED ORDER — FAMOTIDINE 20 MG PO TABS
20.0000 mg | ORAL_TABLET | Freq: Every day | ORAL | Status: DC
  Administered 2020-10-17 – 2020-10-21 (×5): 20 mg
  Filled 2020-10-16 (×5): qty 1

## 2020-10-16 MED ORDER — APIXABAN 5 MG PO TABS
10.0000 mg | ORAL_TABLET | Freq: Two times a day (BID) | ORAL | Status: DC
  Administered 2020-10-16 – 2020-10-20 (×8): 10 mg
  Filled 2020-10-16 (×8): qty 2

## 2020-10-16 MED ORDER — APIXABAN 5 MG PO TABS: 10.0000 mg | ORAL_TABLET | Freq: Two times a day (BID) | ORAL | Status: DC

## 2020-10-16 MED ORDER — FREE WATER
200.0000 mL | Status: DC
  Administered 2020-10-16 – 2020-10-21 (×30): 200 mL

## 2020-10-16 MED ORDER — SODIUM CHLORIDE 0.9% FLUSH
10.0000 mL | Freq: Two times a day (BID) | INTRAVENOUS | Status: DC
  Administered 2020-10-16 – 2020-10-19 (×8): 10 mL
  Administered 2020-10-20: 20 mL
  Administered 2020-10-20 – 2020-10-21 (×2): 10 mL

## 2020-10-16 NOTE — Progress Notes (Signed)
Peripherally Inserted Central Catheter Placement  The IV Nurse has discussed with the patient and/or persons authorized to consent for the patient, the purpose of this procedure and the potential benefits and risks involved with this procedure.  The benefits include less needle sticks, lab draws from the catheter, and the patient may be discharged home with the catheter. Risks include, but not limited to, infection, bleeding, blood clot (thrombus formation), and puncture of an artery; nerve damage and irregular heartbeat and possibility to perform a PICC exchange if needed/ordered by physician.  Alternatives to this procedure were also discussed.  Bard Power PICC patient education guide, fact sheet on infection prevention and patient information card has been provided to patient /or left at bedside.    PICC Placement Documentation  PICC Triple Lumen 10/16/20 PICC Right Basilic 35 cm 0 cm (Active)  Indication for Insertion or Continuance of Line Vasoactive infusions 10/16/20 0900  Exposed Catheter (cm) 0 cm 10/16/20 0900  Site Assessment Clean;Dry;Intact 10/16/20 0900  Lumen #1 Status Flushed;Saline locked;Blood return noted 10/16/20 0900  Lumen #2 Status Flushed;Saline locked;Blood return noted 10/16/20 0900  Lumen #3 Status Flushed;Saline locked;Blood return noted 10/16/20 0900  Dressing Type Transparent;Securing device 10/16/20 0900  Dressing Status Clean;Dry;Intact 10/16/20 0900  Antimicrobial disc in place? Yes 10/16/20 0900  Safety Lock Not Applicable 10/16/20 0900  Dressing Intervention New dressing 10/16/20 0900  Dressing Change Due 10/23/20 10/16/20 0900    Consent given by Patient's Aunt, Tomasa Blase, via telephone, verified by 2 IV/PICC RNs.   Annett Fabian 10/16/2020, 9:19 AM

## 2020-10-16 NOTE — Progress Notes (Signed)
Palliative:  HPI: 60 y.o.femalewith past medical history of headache, seizures, anemia, hypertension, chronic back pain on chronic opioids, anxietyadmitted on12/16/2021with COVID pneumonia.Hospitalization complicated by ongoing respiratory failure requiring prolonged vent support, renal failure (not needing dialysis), RUL PE/bilateral DVT. Tentative plans for tracheostomy placement.   Margaret Shepard is much unchanged today. She now has tracheostomy. She continues to be heavily sedated. Discussed with Dr. Belia Heman and no new concerns. Potentially begin trials of weaning and titrating down sedation. Discussed chronic pain medication use prior to admission.  Oxycontin 30 mg 2-3 times daily. OxyIR 15 mg every 4 hours as needed.   This will equal at minimum fentanyl 50 mcg/hr and max fentanyl 100 mcg/hr. Neither dosage is anywhere close to the equivalent requiring via hydromorphone infusion at this time. Would be reasonable to begin fentanyl 75 mcg/hr to account for chronic home opioid use and may be titrated up as needed to account for acute on chronic management. Would wait at least 24-48 hours prior to titration.   I touched base with her aunt Margaret Shepard. We discussed current status. Plans for vent and sedation weaning as Juli tolerates. Margaret Shepard understands that this can be a process that takes time. We also discussed that she is off isolation and family able to visit per policy. Margaret Shepard expresses understanding and that they have visited today. She has no further questions/concerns. Emotional support provided.   Exam: Ill-appearing. Sedated on vent. No dyssynchrony on vent - tolerating. No grimacing, appears comfortable but sedated. Tachycardia. Vent 40% FiO2. Will follow some simple commands at times per nursing but unable to awaken during my visit.   Plan: - Consider fentanyl patch 75 mcg/hr to account for chronic home opioid and then titrated of IV sedation down as tolerated.  - Continue  support. Family hopeful she will have improvement.   25 min  Yong Channel, NP Palliative Medicine Team Pager [phone] (Please see [email or web address] for schedule) Team Phone [phone]    Greater than 50%  of this time was spent counseling and coordinating care related to the above assessment and plan

## 2020-10-16 NOTE — Progress Notes (Signed)
GOALS OF CARE DISCUSSION  The Clinical status was relayed to family in detail.   Updated and notified of patients medical condition.   patient with increased WOB and using accessory muscles to breathe, patient required [facility] Explained to family course of therapy and the modalities      Family understands the situation.   Family are satisfied with Plan of action and management. All questions answered  Additional CC time 32 mins   Fayette Gasner Santiago Glad, M.D.  [facility]  Medical Director [facility] Medical Director [facility] Cardio-Pulmonary Department

## 2020-10-16 NOTE — Progress Notes (Signed)
CRITICAL CARE NOTE 60 yo female admitted with acute hypoxic respiratory failure secondary to pneumonia due to COVID-19 requiring HHFNC and NRB now intubated and severe resp failure complicated by DVT and PE  History of Present Illness:  This is a 60 yo unvaccinated female who presented to [facility] ER on 12/16 via EMS with c/o worsening shortness of breath, cough, nausea, diarrhea, and poor po intake. Per ER notes pt tested positive for COVID-19 8 days prior to ER presentation. EMS reported pt severely hypoxic with O2 sats less than 50% on RA. Therefore she was placed on 15L NRB with O2 sats increasing to 81%. Upon arrival to the ER pt transitioned to [facility] and NRB due to continued hypoxia and tachypnea. Lab results revealed CO2 19, glucose 174, anon gap 18, LDH 646, troponin 260, ferritin 478, pct <0.10, lactic acid 4.9, BNP 294, and vbg pH 7.41/pCO2 37/bicarb 23.5. COVID-19 positive and CXR concerning for multifocal pneumonia. There was also concern of possible pulmonary embolism, however due to severe hypoxia pt unable to tolerate transfer for CTA Chest. Therefore, heparin gtt initiated. Pt also received aspirin, decadron, ibuprofen, zofran, and remdesivir. She was subsequently admitted to ICU per PCCM team for additional workup and treatment, but remained in the ER pending ICU bed availability.   Significant Hospital Events:  12/16: Pt admitted to ICU with COVID-19 pneumonia requiring HHFNC and NRB remained in the ER pending ICU bed availability  12/17 high risk for cardiac arrest, on pressors 12/18 Off pressors 12/19 worsening renal function. Nephrology consult 09/28/20- patient was noted to be severely acidemic with HAGMA in am s/p 2 amp bicarb with repeat ABG significantly improved 12/21-patient will require HD, will place vascath today. Meds reviewed and changed, TG elevated on propofol. DCd IVF. 12/22- Patient remains critically ill. Reviewed plan with nephrologist, patient will not  have HD today. 10/01/20-patient on 35%FIo2 8peep, she diruesed >3L we will hold HD, continue diuresis. SBT.  10/02/20-patient with tachyarrythmia overnight. S/p cardiology evaluation. For SBT today.  10/03/20-patient with no overnight events, remains critically ill on MV. Diuresed well overnight. Discussed care plan with nephrologist today.  10/04/20- Patient remains critically ill. She had SBT daily and has not been able to pass yet.  10/05/20-with no acute events overnight remains on PEEP of 8 and FiO2 of 55% 10/06/20-Patient on peep of 8 and FiO2 60%. Trying to weansedation but very difficult in spite of using oral medications 10/07/20-patient tolerated 5/5 trial for over 30 minutes. She was able to follow commands today. She was able to wiggle her right toes. She was able to stick out her tongue. 10/08/2020-Lightly sedated, able to follow some simple commands. Currently on 50% FiO2 &8 PEEP, increased work of breathing on vent. Consult ENT for Trach. 10/09/2020-Pt very asynchronous with vent, increasing sedation with prn Paralytics. Begin Recruitment maneuvers and Diurese today; ENT evaluated pt, tentative plan for trach some time next week 10/10/2020-switch to [facility], adjusted ventilator settings 10/11/2020-ongoing issues with ventilator asynchrony no matter what mode utilized. Requiring intermittent paralytics.  10/13/19-updated [name] (POA). Plan for palliative care evaluation. Patient still critically ill after 17d. Poor prognosis  10/14/19- Patient continues to be critically ill. I spoke with [name] and we discussed tracheostomy. She met with palliative care 1/5 failure to wean from vent 1/6 s/p trach       CC  follow up respiratory failure  SUBJECTIVE Patient remains critically ill Prognosis is guarded S/p trach  BP 126/78   Pulse (!) 110   Temp 99 F (  37.2 C) (Axillary)   Resp (!) 30   Ht _0  (1.626 m)   Wt 61.8 kg   SpO2 98%   BMI 23.39  kg/m    I/O last 3 completed shifts: In: 1558.7 [I.V.:1558.7] Out: 3000 [Urine:3000] Total I/O In: 3750.3 [I.V.:76.3; NG/GT:3674] Out: 350 [Urine:350]  SpO2: 98 % O2 Flow Rate (L/min): 15 L/min FiO2 (%): 50 %  Estimated body mass index is 23.39 kg/m as calculated from the following:   Height as of this encounter: _1  (1.626 m).   Weight as of this encounter: 61.8 kg.  SIGNIFICANT EVENTS   REVIEW OF SYSTEMS  PATIENT IS UNABLE TO PROVIDE COMPLETE REVIEW OF SYSTEMS DUE TO SEVERE CRITICAL ILLNESS   Pressure Injury 10/09/20 Buttocks Left;Posterior;Proximal Stage 2 -  Partial thickness loss of dermis presenting as a shallow open injury with a red, pink wound bed without slough. gluetal fold (Active)  10/09/20 0810  Location: Buttocks  Location Orientation: Left;Posterior;Proximal  Staging: Stage 2 -  Partial thickness loss of dermis presenting as a shallow open injury with a red, pink wound bed without slough.  Wound Description (Comments): gluetal fold  Present on Admission:       PHYSICAL EXAMINATION:  GENERAL:critically ill appearing, +resp distress HEAD: Normocephalic, atraumatic.  EYES: Pupils equal, round, reactive to light.  No scleral icterus.  MOUTH: Moist mucosal membrane. NECK: Supple. S/p trach PULMONARY: +rhonchi, +wheezing CARDIOVASCULAR: S1 and S2. Regular rate and rhythm. No murmurs, rubs, or gallops.  GASTROINTESTINAL: Soft, nontender, -distended.  Positive bowel sounds.   MUSCULOSKELETAL: No swelling, clubbing, or edema.  NEUROLOGIC: obtunded, GCS<8 SKIN:intact,warm,dry  MEDICATIONS: I have reviewed all medications and confirmed regimen as documented   CULTURE RESULTS   Recent Results (from the past 240 hour(s))  CULTURE, BLOOD (ROUTINE X 2) w Reflex to ID Panel     Status: None (Preliminary result)   Collection Time: 10/12/20  9:46 AM   Specimen: BLOOD RIGHT HAND  Result Value Ref Range Status   Specimen Description BLOOD RIGHT HAND  Final    Special Requests   Final    BOTTLES DRAWN AEROBIC AND ANAEROBIC Blood Culture adequate volume   Culture   Final    NO GROWTH 4 DAYS Performed at [facility], [address] [place], [place] [address]    Report Status PENDING  Incomplete  CULTURE, BLOOD (ROUTINE X 2) w Reflex to ID Panel     Status: None (Preliminary result)   Collection Time: 10/12/20  9:46 AM   Specimen: BLOOD RIGHT ARM  Result Value Ref Range Status   Specimen Description BLOOD RIGHT ARM  Final   Special Requests   Final    BOTTLES DRAWN AEROBIC AND ANAEROBIC Blood Culture adequate volume   Culture   Final    NO GROWTH 4 DAYS Performed at [facility], [address] [place], [place] [address]    Report Status PENDING  Incomplete          IMAGING    DG Abd 1 View  Result Date: 11/07/2020 CLINICAL DATA:  60 year old female with nasogastric tube placement EXAM: ABDOMEN - 1 VIEW COMPARISON:  09/25/2020 FINDINGS: Nasogastric tube terminates in the left upper quadrant within the stomach. Stomach is decompressed. No significant small bowel gas. Minimal gas within the colon without distention. No air-fluid levels. IMPRESSION: Limited plain film of the abdomen demonstrates nasogastric tube terminating in the stomach which is decompressed. Electronically Signed   By: Corrie Mckusick D.O.   On:  11/03/2020 15:16   [place] EKG SITE RITE  Result Date: 11/09/2020 If Site Rite image not attached, placement could not be confirmed due to current cardiac rhythm.    Nutrition Status: Nutrition Problem: Inadequate oral intake Etiology: inability to eat Signs/Symptoms: NPO status Interventions: Tube feeding,Prostat,MVI     Indwelling Urinary Catheter continued, requirement due to   Reason to continue Indwelling Urinary Catheter strict Intake/Output monitoring for hemodynamic instability   Central Line/ continued, requirement due to  Reason to continue [place] of central venous pressure  or other hemodynamic parameters and poor IV access   Ventilator continued, requirement due to severe respiratory failure   Ventilator Sedation RASS 0 to -2      ASSESSMENT AND PLAN SYNOPSIS  Acute hypoxemic respiratory failure due to COVID-19 pneumonia failure to wean from vent due to ARDS Acute hypoxic respiratory failure secondary to COVID-19 pneumonia with ARDS, and small Right Upper Lobe PE S/p TRACH  Severe ACUTE Hypoxic and Hypercapnic Respiratory Failure -continue Full MV support -continue Bronchodilator Therapy -Wean Fio2 and PEEP as tolerated -will perform SAT/SBT when respiratory parameters are met -VAP/VENT bundle implementation  ACUTE DIASTOLIC CARDIAC FAILURE-  -oxygen as needed -Lasix as tolerated   Morbid obesity, possible OSA.   S/p trach Will certainly impact respiratory mechanics   ACUTE KIDNEY INJURY/Renal Failure -continue Foley Catheter-assess need -Avoid nephrotoxic agents -Follow urine output, BMP -Ensure adequate renal perfusion, optimize oxygenation -Renal dose medications     NEUROLOGY - intubated and sedated - minimal sedation to achieve a RASS goal: -1 Wake up assessment pending    CARDIAC ICU monitoring   GI GI PROPHYLAXIS as indicated  NUTRITIONAL STATUS Nutrition Status: Nutrition Problem: Inadequate oral intake Etiology: inability to eat Signs/Symptoms: NPO status Interventions: Tube feeding,Prostat,MVI   DIET-->TF's as tolerated Constipation protocol as indicated  ENDO - will use ICU hypoglycemic\Hyperglycemia protocol if indicated     ELECTROLYTES -follow labs as needed -replace as needed -pharmacy consultation and following   DVT/GI PRX ordered and assessed TRANSFUSIONS AS NEEDED MONITOR FSBS I Assessed the need for Labs I Assessed the need for Foley I Assessed the need for Central Venous Line Family Discussion when available I Assessed the need for Mobilization I made an Assessment of medications  to be adjusted accordingly Safety Risk assessment completed   CASE DISCUSSED IN MULTIDISCIPLINARY ROUNDS WITH ICU TEAM  Critical Care Time devoted to patient care services described in this note is 45  minutes.   Overall, patient is critically ill, prognosis is guarded.      Corrin Parker, M.D.  [facility]  Medical Director [place] Director [facility] Cardio-Pulmonary Department

## 2020-10-16 NOTE — Progress Notes (Signed)
Pt was delayed but followed commands during WUA. HR elevated to 130's, BP elevated, and WOB increased approximately an hour after SBT started. Pt returned to [facility] settings. Per MD dont re-start versed gtt at this time, start precedex. ETT and NG intact. Tolerating tube feeds. Pt appears to be resting now with RASS at goal of -2.

## 2020-10-17 DIAGNOSIS — U071 COVID-19: Secondary | ICD-10-CM | POA: Diagnosis not present

## 2020-10-17 DIAGNOSIS — J1282 Pneumonia due to coronavirus disease 2019: Secondary | ICD-10-CM | POA: Diagnosis not present

## 2020-10-17 LAB — GLUCOSE, CAPILLARY
Glucose-Capillary: 79 mg/dL (ref 70–99)
Glucose-Capillary: 154 mg/dL — ABNORMAL HIGH (ref 70–99)
Glucose-Capillary: 105 mg/dL — ABNORMAL HIGH (ref 70–99)
Glucose-Capillary: 133 mg/dL — ABNORMAL HIGH (ref 70–99)
Glucose-Capillary: 116 mg/dL — ABNORMAL HIGH (ref 70–99)
Glucose-Capillary: 97 mg/dL (ref 70–99)

## 2020-10-17 LAB — CBC
HCT: 22.7 % — ABNORMAL LOW (ref 36.0–46.0)
HCT: 23.6 % — ABNORMAL LOW (ref 36.0–46.0)
Hemoglobin: 7 g/dL — ABNORMAL LOW (ref 12.0–15.0)
Hemoglobin: 7.5 g/dL — ABNORMAL LOW (ref 12.0–15.0)
MCH: 28.2 pg (ref 26.0–34.0)
MCH: 29 pg (ref 26.0–34.0)
MCHC: 30.8 g/dL (ref 30.0–36.0)
MCHC: 31.8 g/dL (ref 30.0–36.0)
MCV: 91.5 fL (ref 80.0–100.0)
MCV: 91.1 fL (ref 80.0–100.0)
Platelets: 248 10*3/uL (ref 150–400)
Platelets: 255 10*3/uL (ref 150–400)
RBC: 2.48 MIL/uL — ABNORMAL LOW (ref 3.87–5.11)
RBC: 2.59 MIL/uL — ABNORMAL LOW (ref 3.87–5.11)
RDW: 15.2 % (ref 11.5–15.5)
RDW: 15.3 % (ref 11.5–15.5)
WBC: 7.5 10*3/uL (ref 4.0–10.5)
WBC: 8.4 10*3/uL (ref 4.0–10.5)
nRBC: 0 % (ref 0.0–0.2)
nRBC: 0 % (ref 0.0–0.2)

## 2020-10-17 LAB — RENAL FUNCTION PANEL
Albumin: 2.2 g/dL — ABNORMAL LOW (ref 3.5–5.0)
Anion gap: 10 (ref 5–15)
BUN: 46 mg/dL — ABNORMAL HIGH (ref 6–20)
CO2: 31 mmol/L (ref 22–32)
Calcium: 8.3 mg/dL — ABNORMAL LOW (ref 8.9–10.3)
Chloride: 102 mmol/L (ref 98–111)
Creatinine, Ser: 1.09 mg/dL — ABNORMAL HIGH (ref 0.44–1.00)
GFR, Estimated: 58 mL/min — ABNORMAL LOW (ref >60)
Glucose, Bld: 122 mg/dL — ABNORMAL HIGH (ref 70–99)
Phosphorus: 3.1 mg/dL (ref 2.5–4.6)
Potassium: 3.7 mmol/L (ref 3.5–5.1)
Sodium: 143 mmol/L (ref 135–145)

## 2020-10-17 LAB — CULTURE, BLOOD (ROUTINE X 2)
Culture: NO GROWTH
Culture: NO GROWTH
Special Requests: ADEQUATE
Special Requests: ADEQUATE

## 2020-10-17 LAB — MAGNESIUM: Magnesium: 1.7 mg/dL (ref 1.7–2.4)

## 2020-10-17 MED ORDER — FENTANYL 75 MCG/HR TD PT72
1.0000 | MEDICATED_PATCH | TRANSDERMAL | Status: DC
  Administered 2020-10-17 – 2020-10-20 (×2): 1 via TRANSDERMAL
  Filled 2020-10-17: qty 1

## 2020-10-17 NOTE — Progress Notes (Signed)
CRITICAL CARE NOTE 60 yo female admitted with acute hypoxic respiratory failure secondary to pneumonia due to COVID-19 requiring HHFNC and NRB now intubated and severe resp failure complicated by DVT and PE.  Treated with cefepime for HCAP Eventually required tracheostomy placement.   Significant Hospital Events:  12/16: Pt admitted to ICU with COVID-19 pneumonia requiring HHFNC and NRB remained in the ER pending ICU bed availability  12/17 high risk for cardiac arrest, on pressors 12/18 Off pressors 12/19 worsening renal function. Nephrology consult 09/28/20- patient was noted to be severely acidemic with HAGMA in am s/p 2 amp bicarb with repeat ABG significantly improved 12/21-patient will require HD, will place vascath today. Meds reviewed and changed, TG elevated on propofol. DCd IVF. 12/22- Patient remains critically ill. Reviewed plan with nephrologist, patient will not have HD today. 10/01/20-patient on 35%FIo2 8peep, she diruesed >3L we will hold HD, continue diuresis. SBT.  10/02/20-patient with tachyarrythmia overnight. S/p cardiology evaluation. For SBT today.  10/03/20-patient with no overnight events, remains critically ill on MV. Diuresed well overnight. Discussed care plan with nephrologist today.  10/04/20- Patient remains critically ill. She had SBT daily and has not been able to pass yet.  10/05/20-with no acute events overnight remains on PEEP of 8 and FiO2 of 55% 10/06/20-Patient on peep of 8 and FiO2 60%. Trying to weansedation but very difficult in spite of using oral medications 10/07/20-patient tolerated 5/5 trial for over 30 minutes. She was able to follow commands today. She was able to wiggle her right toes. She was able to stick out her tongue. 10/08/2020-Lightly sedated, able to follow some simple commands. Currently on 50% FiO2 &8 PEEP, increased work of breathing on vent. Consult ENT for Trach. 10/09/2020-Pt very asynchronous with vent,  increasing sedation with prn Paralytics. Begin Recruitment maneuvers and Diurese today; ENT evaluated pt, tentative plan for trach some time next week 10/10/2020-switch to [facility], adjusted ventilator settings 10/11/2020-ongoing issues with ventilator asynchrony no matter what mode utilized. Requiring intermittent paralytics.  10/13/19-updated [name] (POA). Plan for palliative care evaluation. Patient still critically ill after 17d. Poor prognosis  10/14/19- Patient continues to be critically ill. I spoke with [name] and we discussed tracheostomy. She met with palliative care 1/5 failure to wean from vent 1/6 s/p trach   CC  follow up respiratory failure  SUBJECTIVE  Remains critically ill, on ventilator  BP (!) 98/57   Pulse (!) 101   Temp (!) 101.6 F (38.7 C) (Axillary) Comment: see MAR, tylenol given PRN  Resp (!) 30   Ht _0  (1.626 m)   Wt 63.7 kg   SpO2 95%   BMI 24.11 kg/m    I/O last 3 completed shifts: In: 9241.3 [P.O.:315; I.V.:942.3; NG/GT:7934; IV Piggyback:50.1] Out: 3025 [Urine:3025] Total I/O In: 32.5 [I.V.:32.5] Out: 350 [Urine:350]  SpO2: 95 % O2 Flow Rate (L/min): 15 L/min FiO2 (%): 40 %  Estimated body mass index is 24.11 kg/m as calculated from the following:   Height as of this encounter: _1  (1.626 m).   Weight as of this encounter: 63.7 kg.  SIGNIFICANT EVENTS   REVIEW OF SYSTEMS  PATIENT IS UNABLE TO PROVIDE COMPLETE REVIEW OF SYSTEMS DUE TO SEVERE CRITICAL ILLNESS   Pressure Injury 10/09/20 Buttocks Left;Posterior;Proximal Stage 2 -  Partial thickness loss of dermis presenting as a shallow open injury with a red, pink wound bed without slough. gluetal fold (Active)  10/09/20 0810  Location: Buttocks  Location Orientation: Left;Posterior;Proximal  Staging: Stage 2 -  Partial thickness  loss of dermis presenting as a shallow open injury with a red, pink wound bed without slough.  Wound Description (Comments): gluetal fold   Present on Admission:       PHYSICAL EXAMINATION: Blood pressure (!) 98/57, pulse (!) 101, temperature (!) 101.6 F (38.7 C), temperature source Axillary, resp. rate (!) 30, height _0  (1.626 m), weight 63.7 kg, SpO2 95 %. Gen:      No acute distress HEENT:  EOMI, sclera anicteric Neck:     No masses; no thyromegaly, trach Lungs:    Clear to auscultation bilaterally; normal respiratory effort CV:         Regular rate and rhythm; no murmurs Abd:      + bowel sounds; soft, non-tender; no palpable masses, no distension Ext:    No edema; adequate peripheral perfusion Skin:      Warm and dry; no rash Neuro: Sedated  Labs/imaging reviewed.   Significant for BUN/creatinine 46/1.09 hemoglobin dropped to 7 WBC 7.5, platelets 240 No new imaging   MEDICATIONS: I have reviewed all medications and confirmed regimen as documented   CULTURE RESULTS   Recent Results (from the past 240 hour(s))  CULTURE, BLOOD (ROUTINE X 2) w Reflex to ID Panel     Status: None   Collection Time: 10/12/20  9:46 AM   Specimen: BLOOD RIGHT HAND  Result Value Ref Range Status   Specimen Description BLOOD RIGHT HAND  Final   Special Requests   Final    BOTTLES DRAWN AEROBIC AND ANAEROBIC Blood Culture adequate volume   Culture   Final    NO GROWTH 5 DAYS Performed at [facility], [address] [place], [place] [address]    Report Status 10/17/2020 FINAL  Final  CULTURE, BLOOD (ROUTINE X 2) w Reflex to ID Panel     Status: None   Collection Time: 10/12/20  9:46 AM   Specimen: BLOOD RIGHT ARM  Result Value Ref Range Status   Specimen Description BLOOD RIGHT ARM  Final   Special Requests   Final    BOTTLES DRAWN AEROBIC AND ANAEROBIC Blood Culture adequate volume   Culture   Final    NO GROWTH 5 DAYS Performed at [facility], [address], [place], [place] [address]    Report Status 10/17/2020 FINAL  Final          IMAGING    No results found.   Nutrition  Status: Nutrition Problem: Inadequate oral intake Etiology: inability to eat Signs/Symptoms: NPO status Interventions: Tube feeding,Prostat,MVI     Indwelling Urinary Catheter continued, requirement due to   Reason to continue Indwelling Urinary Catheter strict Intake/Output monitoring for hemodynamic instability   Central Line/ continued, requirement due to  Reason to continue [place] of central venous pressure or other hemodynamic parameters and poor IV access   Ventilator continued, requirement due to severe respiratory failure   Ventilator Sedation RASS 0 to -2      ASSESSMENT AND PLAN SYNOPSIS  Acute hypoxemic respiratory failure due to COVID-19 pneumonia failure to wean from vent due to ARDS Acute hypoxic respiratory failure secondary to COVID-19 pneumonia with ARDS, and small Right Upper Lobe PE and bilateral DVT S/p TRACH  Severe ACUTE Hypoxic and Hypercapnic Respiratory Failure Continue mechanical support Weaning trials as tolerated Follow intermittent chest x-ray  ACUTE DIASTOLIC CARDIAC FAILURE-  -oxygen as needed -Lasix as tolerated   Morbid obesity, possible OSA.   S/p trach   ACUTE KIDNEY INJURY/Renal Failure Nephrology  on board.  No need for dialysis at present Follow-up renal function    NEUROLOGY - intubated and sedated - minimal sedation to achieve a RASS goal: -1 Wake up assessment pending On Dilaudid and Precedex drip We will start fentanyl patch as recommended by palliative care to titrate IV drips. Continue OxyContin  CARDIAC ICU monitoring  Anemia of chronic disease Hemoglobin 7 noted today.  She is on Eliquis but no evidence of active bleed Follow CBC.  Consider CT abdomen pelvis if it drops further Transfuse for hemoglobin less than 7  Small PE, bilateral DVT Continue Eliquis   GI GI PROPHYLAXIS as indicated  NUTRITIONAL STATUS Nutrition Status: Nutrition Problem: Inadequate oral intake Etiology:  inability to eat Signs/Symptoms: NPO status Interventions: Tube feeding,Prostat,MVI   DIET-->TF's as tolerated Constipation protocol as indicated  ENDO - will use ICU hypoglycemic\Hyperglycemia protocol if indicated   ELECTROLYTES -follow labs as needed -replace as needed -pharmacy consultation and following  Aunt updated over telephone 1/8  DVT/GI PRX ordered and assessed TRANSFUSIONS AS NEEDED MONITOR FSBS I Assessed the need for Labs I Assessed the need for Foley I Assessed the need for Central Venous Line Family Discussion when available I Assessed the need for Mobilization I made an Assessment of medications to be adjusted accordingly Safety Risk assessment completed   CASE DISCUSSED IN MULTIDISCIPLINARY ROUNDS WITH ICU TEAM  The patient is critically ill with multiple organ system failure and requires high complexity decision making for assessment and support, frequent evaluation and titration of therapies, advanced monitoring, review of radiographic studies and interpretation of complex data.   Critical Care Time devoted to patient care services, exclusive of separately billable procedures, described in this note is 35 minutes.   Marshell Garfinkel MD [facility] Please see [email or web address] for pager details.  10/17/2020, 10:51 AM

## 2020-10-17 NOTE — Progress Notes (Signed)
SHIFT SUMMARY:   Patient remains on trach vent support at this time Rested comfortably throughout shift Patient remains sedated Vital signs stable throughout shift Patient tolerating tube feeds  Patients aunt was updated via phone by this RN & MD

## 2020-10-18 ENCOUNTER — Inpatient Hospital Stay: Payer: Medicaid Other

## 2020-10-18 DIAGNOSIS — U071 COVID-19: Secondary | ICD-10-CM | POA: Diagnosis not present

## 2020-10-18 DIAGNOSIS — J1282 Pneumonia due to coronavirus disease 2019: Secondary | ICD-10-CM | POA: Diagnosis not present

## 2020-10-18 LAB — CBC
HCT: 23.9 % — ABNORMAL LOW (ref 36.0–46.0)
Hemoglobin: 7.5 g/dL — ABNORMAL LOW (ref 12.0–15.0)
MCH: 28.6 pg (ref 26.0–34.0)
MCHC: 31.4 g/dL (ref 30.0–36.0)
MCV: 91.2 fL (ref 80.0–100.0)
Platelets: 269 10*3/uL (ref 150–400)
RBC: 2.62 MIL/uL — ABNORMAL LOW (ref 3.87–5.11)
RDW: 15.4 % (ref 11.5–15.5)
WBC: 7.2 10*3/uL (ref 4.0–10.5)
nRBC: 0 % (ref 0.0–0.2)

## 2020-10-18 LAB — RENAL FUNCTION PANEL
Albumin: 2.2 g/dL — ABNORMAL LOW (ref 3.5–5.0)
Anion gap: 9 (ref 5–15)
BUN: 49 mg/dL — ABNORMAL HIGH (ref 6–20)
CO2: 30 mmol/L (ref 22–32)
Calcium: 8.3 mg/dL — ABNORMAL LOW (ref 8.9–10.3)
Chloride: 105 mmol/L (ref 98–111)
Creatinine, Ser: 1.11 mg/dL — ABNORMAL HIGH (ref 0.44–1.00)
GFR, Estimated: 57 mL/min — ABNORMAL LOW (ref >60)
Glucose, Bld: 148 mg/dL — ABNORMAL HIGH (ref 70–99)
Phosphorus: 3.5 mg/dL (ref 2.5–4.6)
Potassium: 3.8 mmol/L (ref 3.5–5.1)
Sodium: 144 mmol/L (ref 135–145)

## 2020-10-18 LAB — GLUCOSE, CAPILLARY
Glucose-Capillary: 105 mg/dL — ABNORMAL HIGH (ref 70–99)
Glucose-Capillary: 126 mg/dL — ABNORMAL HIGH (ref 70–99)
Glucose-Capillary: 145 mg/dL — ABNORMAL HIGH (ref 70–99)
Glucose-Capillary: 172 mg/dL — ABNORMAL HIGH (ref 70–99)
Glucose-Capillary: 75 mg/dL (ref 70–99)

## 2020-10-18 LAB — MAGNESIUM: Magnesium: 1.6 mg/dL — ABNORMAL LOW (ref 1.7–2.4)

## 2020-10-18 LAB — PHOSPHORUS: Phosphorus: 3.5 mg/dL (ref 2.5–4.6)

## 2020-10-18 MED ORDER — MAGNESIUM SULFATE 4 GM/100ML IV SOLN
4.0000 g | Freq: Once | INTRAVENOUS | Status: AC
  Administered 2020-10-18: 4 g via INTRAVENOUS
  Filled 2020-10-18: qty 100

## 2020-10-18 NOTE — Progress Notes (Signed)
Pt weaned to [facility] at 45%, tolerating well at this time. Patient is able to nod head when asked if she is doing OK. RN notified. Will continue to monitor.

## 2020-10-18 NOTE — Progress Notes (Addendum)
CRITICAL CARE NOTE 60 yo female admitted with acute hypoxic respiratory failure secondary to pneumonia due to COVID-19 requiring HHFNC and NRB now intubated and severe resp failure complicated by DVT and PE.  Treated with cefepime for HCAP Eventually required tracheostomy placement.   Significant Hospital Events:  12/16: Pt admitted to ICU with COVID-19 pneumonia requiring HHFNC and NRB remained in the ER pending ICU bed availability  12/17 high risk for cardiac arrest, on pressors 12/18 Off pressors 12/19 worsening renal function. Nephrology consult 09/28/20- patient was noted to be severely acidemic with HAGMA in am s/p 2 amp bicarb with repeat ABG significantly improved 12/21-patient will require HD, will place vascath today. Meds reviewed and changed, TG elevated on propofol. DCd IVF. 12/22- Patient remains critically ill. Reviewed plan with nephrologist, patient will not have HD today. 10/01/20-patient on 35%FIo2 8peep, she diruesed >3L we will hold HD, continue diuresis. SBT.  10/02/20-patient with tachyarrythmia overnight. S/p cardiology evaluation. For SBT today.  10/03/20-patient with no overnight events, remains critically ill on MV. Diuresed well overnight. Discussed care plan with nephrologist today.  10/04/20- Patient remains critically ill. She had SBT daily and has not been able to pass yet.  10/05/20-with no acute events overnight remains on PEEP of 8 and FiO2 of 55% 10/06/20-Patient on peep of 8 and FiO2 60%. Trying to weansedation but very difficult in spite of using oral medications 10/07/20-patient tolerated 5/5 trial for over 30 minutes. She was able to follow commands today. She was able to wiggle her right toes. She was able to stick out her tongue. 10/08/2020-Lightly sedated, able to follow some simple commands. Currently on 50% FiO2 &8 PEEP, increased work of breathing on vent. Consult ENT for Trach. 10/09/2020-Pt very asynchronous with vent,  increasing sedation with prn Paralytics. Begin Recruitment maneuvers and Diurese today; ENT evaluated pt, tentative plan for trach some time next week 10/10/2020-switch to [facility], adjusted ventilator settings 10/11/2020-ongoing issues with ventilator asynchrony no matter what mode utilized. Requiring intermittent paralytics.  10/13/19-updated [name] (POA). Plan for palliative care evaluation. Patient still critically ill after 17d. Poor prognosis  10/14/19- Patient continues to be critically ill. I spoke with [name] and we discussed tracheostomy. She met with palliative care 1/5 failure to wean from vent 1/6 s/p trach 1/9 started fentanyl patch, wean down Dilaudid.  On pressure support weans.   CC  follow up respiratory failure  SUBJECTIVE  Continues on Dilaudid, Precedex drip. More awake today.  Starting pressure support weans.  BP 117/74   Pulse 93   Temp 99.2 F (37.3 C) (Axillary)   Resp (!) 30   Ht '5\' 4"'  (1.626 m)   Wt 63.7 kg   SpO2 96%   BMI 24.11 kg/m    I/O last 3 completed shifts: In: 2257.3 [P.O.:315; I.V.:1094.2; NG/GT:848] Out: 3125 [Urine:3125] No intake/output data recorded.  SpO2: 96 % O2 Flow Rate (L/min): 15 L/min FiO2 (%): 45 %  Estimated body mass index is 24.11 kg/m as calculated from the following:   Height as of this encounter: '5\' 4"'  (1.626 m).   Weight as of this encounter: 63.7 kg.  SIGNIFICANT EVENTS   REVIEW OF SYSTEMS  PATIENT IS UNABLE TO PROVIDE COMPLETE REVIEW OF SYSTEMS DUE TO SEVERE CRITICAL ILLNESS   Pressure Injury 10/09/20 Buttocks Left;Posterior;Proximal Stage 2 -  Partial thickness loss of dermis presenting as a shallow open injury with a red, pink wound bed without slough. gluetal fold (Active)  10/09/20 0810  Location: Buttocks  Location Orientation: Left;Posterior;Proximal  Staging:  Stage 2 -  Partial thickness loss of dermis presenting as a shallow open injury with a red, pink wound bed without slough.  Wound  Description (Comments): gluetal fold  Present on Admission:       PHYSICAL EXAMINATION: Blood pressure 117/74, pulse 93, temperature 99.2 F (37.3 C), temperature source Axillary, resp. rate (!) 30, height '5\' 4"'  (1.626 m), weight 63.7 kg, SpO2 96 %. Gen:      No acute distress HEENT:  EOMI, sclera anicteric Neck:     No masses; no thyromegaly, trach Lungs:    Clear to auscultation bilaterally; normal respiratory effort CV:         Regular rate and rhythm; no murmurs Abd:      + bowel sounds; soft, non-tender; no palpable masses, no distension Ext:    No edema; adequate peripheral perfusion Skin:      Warm and dry; no rash Neuro: Awake, nods to questions  Labs/imaging reviewed.   Significant for BUN/creatinine 49/1.11, WBC 7.2, hemoglobin 12.5, platelets 269  MEDICATIONS: I have reviewed all medications and confirmed regimen as documented   CULTURE RESULTS   Recent Results (from the past 240 hour(s))  CULTURE, BLOOD (ROUTINE X 2) w Reflex to ID Panel     Status: None   Collection Time: 10/12/20  9:46 AM   Specimen: BLOOD RIGHT HAND  Result Value Ref Range Status   Specimen Description BLOOD RIGHT HAND  Final   Special Requests   Final    BOTTLES DRAWN AEROBIC AND ANAEROBIC Blood Culture adequate volume   Culture   Final    NO GROWTH 5 DAYS Performed at [facility], [address] [place], [place] [address]    Report Status 10/17/2020 FINAL  Final  CULTURE, BLOOD (ROUTINE X 2) w Reflex to ID Panel     Status: None   Collection Time: 10/12/20  9:46 AM   Specimen: BLOOD RIGHT ARM  Result Value Ref Range Status   Specimen Description BLOOD RIGHT ARM  Final   Special Requests   Final    BOTTLES DRAWN AEROBIC AND ANAEROBIC Blood Culture adequate volume   Culture   Final    NO GROWTH 5 DAYS Performed at [facility], [address] [place], [place] [address]    Report Status 10/17/2020 FINAL  Final          IMAGING    DG Chest Port 1  View  Result Date: 10/18/2020 CLINICAL DATA:  Acute respiratory failure EXAM: PORTABLE CHEST 1 VIEW COMPARISON:  Chest x-rays dated 10/12/2020, 10/10/2020 and 10/04/2020. Chest CT dated 09/25/2020. FINDINGS: Endotracheal tube has been removed. Tracheostomy has been placed with tip appropriately positioned in the midline. Enteric tube passes below the diaphragm. LEFT IJ central catheter has been removed. RIGHT-sided PICC line is now in place with tip adequately positioned at the level of the mid SVC. Improved aeration at the RIGHT lung base. Patchy/diffuse bilateral airspace opacities are otherwise not significantly changed compared to multiple prior exams. No new lung findings. No pleural effusion or pneumothorax is seen. IMPRESSION: 1. Tracheostomy has been placed with tip appropriately positioned in the midline. 2. RIGHT-sided PICC line appears adequately positioned with tip at the level of the mid SVC. 3. Improved aeration at the RIGHT lung base. Patchy/diffuse bilateral airspace opacities are otherwise not significantly changed, consistent with persistent multifocal pneumonia and/or developing postinflammatory fibrosis. 4. No pneumothorax. Electronically Signed   By: Franki Cabot M.D.   On: 10/18/2020 07:21  Nutrition Status: Nutrition Problem: Inadequate oral intake Etiology: inability to eat Signs/Symptoms: NPO status Interventions: Tube feeding,Prostat,MVI     Indwelling Urinary Catheter continued, requirement due to   Reason to continue Indwelling Urinary Catheter strict Intake/Output monitoring for hemodynamic instability   Central Line/ continued, requirement due to  Reason to continue [place] of central venous pressure or other hemodynamic parameters and poor IV access   Ventilator continued, requirement due to severe respiratory failure   Ventilator Sedation RASS 0 to -2      ASSESSMENT AND PLAN SYNOPSIS  Acute hypoxemic respiratory failure due to COVID-19  pneumonia failure to wean from vent due to ARDS Acute hypoxic respiratory failure secondary to COVID-19 pneumonia with ARDS, and small Right Upper Lobe PE and bilateral DVT S/p TRACH  Severe ACUTE Hypoxic and Hypercapnic Respiratory Failure Continue mechanical support Continue pressure support weans as tolerated Follow intermittent chest x-ray  ACUTE DIASTOLIC CARDIAC FAILURE-  -oxygen as needed -Lasix as tolerated   Morbid obesity, possible OSA.   S/p trach   ACUTE KIDNEY INJURY/Renal Failure Nephrology on board.  No need for dialysis at present Follow-up renal function    NEUROLOGY - intubated and sedated - minimal sedation to achieve a RASS goal: -1 Wake up assessment pending On Dilaudid and Precedex drip Started fentanyl patch to titrate off Dilaudid drip. Continue OxyContin  CARDIAC ICU monitoring  Anemia of chronic disease Hemoglobin 7 noted today.  She is on Eliquis but no evidence of active bleed Follow CBC  Small PE, bilateral DVT Continue Eliquis   GI GI PROPHYLAXIS as indicated  NUTRITIONAL STATUS Nutrition Status: Nutrition Problem: Inadequate oral intake Etiology: inability to eat Signs/Symptoms: NPO status Interventions: Tube feeding,Prostat,MVI   DIET-->TF's as tolerated Constipation protocol as indicated  ENDO - will use ICU hypoglycemic\Hyperglycemia protocol if indicated   ELECTROLYTES -follow labs as needed -replace as needed -pharmacy consultation and following  Aunt updated over telephone 1/8 and 1/9  DVT/GI PRX ordered and assessed TRANSFUSIONS AS NEEDED MONITOR FSBS I Assessed the need for Labs I Assessed the need for Foley I Assessed the need for Central Venous Line Family Discussion when available I Assessed the need for Mobilization I made an Assessment of medications to be adjusted accordingly Safety Risk assessment completed   CASE DISCUSSED IN MULTIDISCIPLINARY ROUNDS WITH ICU TEAM  The patient is critically  ill with multiple organ system failure and requires high complexity decision making for assessment and support, frequent evaluation and titration of therapies, advanced monitoring, review of radiographic studies and interpretation of complex data.   Critical Care Time devoted to patient care services, exclusive of separately billable procedures, described in this note is 45 minutes.   Marshell Garfinkel MD [facility] Please see [email or web address] for pager details.  10/18/2020, 8:47 AM

## 2020-10-18 NOTE — Progress Notes (Signed)
Patient is awake when nurse enters room. Confused. States(lip reading by nurse) she is not in hospital, the month is May and she is in pain. Patient will not follow commans. When not stimulated she falls asleep. She is presently on Dilaudid IV and Precedex IV. She has a Fentanyl patch on her left upper arm. At this time Dr.Mannam request to wean the Dilaudid IV.

## 2020-10-18 NOTE — Progress Notes (Signed)
Tolerated tracheostomy collar for 2 hours. Started on Oxycodone 15 mg every 8 hours. Ina better mood . Confused all day about where she is. Constantly told me she was not in the hospital. Ask for water this PM. No other changes.

## 2020-10-19 LAB — RENAL FUNCTION PANEL
Albumin: 2.1 g/dL — ABNORMAL LOW (ref 3.5–5.0)
Anion gap: 9 (ref 5–15)
BUN: 42 mg/dL — ABNORMAL HIGH (ref 6–20)
CO2: 29 mmol/L (ref 22–32)
Calcium: 8.2 mg/dL — ABNORMAL LOW (ref 8.9–10.3)
Chloride: 105 mmol/L (ref 98–111)
Creatinine, Ser: 0.9 mg/dL (ref 0.44–1.00)
GFR, Estimated: >60 mL/min (ref >60)
Glucose, Bld: 100 mg/dL — ABNORMAL HIGH (ref 70–99)
Phosphorus: 3.5 mg/dL (ref 2.5–4.6)
Potassium: 3.9 mmol/L (ref 3.5–5.1)
Sodium: 143 mmol/L (ref 135–145)

## 2020-10-19 LAB — GLUCOSE, CAPILLARY
Glucose-Capillary: 63 mg/dL — ABNORMAL LOW (ref 70–99)
Glucose-Capillary: 136 mg/dL — ABNORMAL HIGH (ref 70–99)
Glucose-Capillary: 139 mg/dL — ABNORMAL HIGH (ref 70–99)
Glucose-Capillary: 131 mg/dL — ABNORMAL HIGH (ref 70–99)
Glucose-Capillary: 154 mg/dL — ABNORMAL HIGH (ref 70–99)
Glucose-Capillary: 106 mg/dL — ABNORMAL HIGH (ref 70–99)
Glucose-Capillary: 84 mg/dL (ref 70–99)

## 2020-10-19 LAB — PREPARE RBC (CROSSMATCH)

## 2020-10-19 LAB — CBC
HCT: 22.2 % — ABNORMAL LOW (ref 36.0–46.0)
Hemoglobin: 6.9 g/dL — ABNORMAL LOW (ref 12.0–15.0)
MCH: 28.4 pg (ref 26.0–34.0)
MCHC: 31.1 g/dL (ref 30.0–36.0)
MCV: 91.4 fL (ref 80.0–100.0)
Platelets: 261 10*3/uL (ref 150–400)
RBC: 2.43 MIL/uL — ABNORMAL LOW (ref 3.87–5.11)
RDW: 14.8 % (ref 11.5–15.5)
WBC: 7.7 10*3/uL (ref 4.0–10.5)
nRBC: 0 % (ref 0.0–0.2)

## 2020-10-19 LAB — MAGNESIUM: Magnesium: 2 mg/dL (ref 1.7–2.4)

## 2020-10-19 LAB — PHOSPHORUS: Phosphorus: 3.6 mg/dL (ref 2.5–4.6)

## 2020-10-19 LAB — ABO/RH: ABO/RH(D): O POS

## 2020-10-19 MED ORDER — INSULIN DETEMIR 100 UNIT/ML ~~LOC~~ SOLN
10.0000 [IU] | Freq: Every day | SUBCUTANEOUS | Status: DC
  Administered 2020-10-19 – 2020-10-20 (×2): 10 [IU] via SUBCUTANEOUS
  Filled 2020-10-19 (×3): qty 0.1

## 2020-10-19 MED ORDER — SODIUM CHLORIDE 0.9% IV SOLUTION: Freq: Once | INTRAVENOUS | Status: AC

## 2020-10-19 MED ORDER — DEXTROSE 50 % IV SOLN
25.0000 mL | Freq: Once | INTRAVENOUS | Status: AC
  Administered 2020-10-19: 25 mL via INTRAVENOUS
  Filled 2020-10-19: qty 50

## 2020-10-19 MED ORDER — VALACYCLOVIR 50 MG/ML ORAL SUSPENSION
1000.0000 mg | Freq: Three times a day (TID) | ORAL | Status: DC
  Filled 2020-10-19: qty 20

## 2020-10-19 MED ORDER — INSULIN ASPART 100 UNIT/ML ~~LOC~~ SOLN
0.0000 [IU] | SUBCUTANEOUS | Status: DC
  Administered 2020-10-19: 3 [IU] via SUBCUTANEOUS
  Administered 2020-10-19 – 2020-10-20 (×4): 2 [IU] via SUBCUTANEOUS
  Administered 2020-10-20 (×2): 3 [IU] via SUBCUTANEOUS
  Administered 2020-10-21: 2 [IU] via SUBCUTANEOUS
  Administered 2020-10-21: 3 [IU] via SUBCUTANEOUS
  Filled 2020-10-19 (×9): qty 1

## 2020-10-19 MED ORDER — VALACYCLOVIR 50 MG/ML ORAL SUSPENSION
1000.0000 mg | Freq: Two times a day (BID) | ORAL | Status: DC
  Administered 2020-10-19 – 2020-10-20 (×3): 1000 mg
  Filled 2020-10-19 (×3): qty 20

## 2020-10-19 MED ORDER — MIDAZOLAM HCL 2 MG/2ML IJ SOLN
2.0000 mg | Freq: Once | INTRAMUSCULAR | Status: AC
  Administered 2020-10-19: 2 mg via INTRAVENOUS

## 2020-10-19 MED ORDER — MIDAZOLAM HCL 2 MG/2ML IJ SOLN
INTRAMUSCULAR | Status: AC
  Filled 2020-10-19: qty 2

## 2020-10-19 NOTE — Progress Notes (Signed)
NP notified patient is not synchronous with the vent, O2 sats mid 80's. Verbal order received for Versed IV 2mg  once now and give dilaudid IV bolus 1mg  now and increase dilaudid gtt to 4mg .

## 2020-10-19 NOTE — Progress Notes (Signed)
PHARMACY NOTE -  RENAL DOSE ADJUSTMENT   Request received for Pharmacy to assist with Valacyclovir renal dose adjustment .  Patient has been initiated on Valacyclovir 1gm q8hr for 7 days SCr 1.11, estimated CrCl 46.5 ml/min  Recommended dosage for CrCl of 30 to 50 mL/min is 1 gm every 12 hours.    Transitioned pt to Valtrex 1gm q12hr for 7 days.    Pharmacy will continue to follow and increase to q8hr dosing should CrCl increase to above 50 mL/min.  Otelia Sergeant, PharmD, [facility] 10/19/2020 2:35 AM

## 2020-10-19 NOTE — Progress Notes (Signed)
During this patient's evening bath, called bedside by care RN to assess a strip of blisters within a rash extending from under the patient's right breast around to midline on her back.  Suspected Shingles - patient placed on airborne/contact precautions - Valacyclovir ordered 1 g TID x 7 days, pharmacy consulted in case need for renal dosing. - patient has dilaudid running as well as oxycodone & morphine PRN for analgesia   Cheryll Cockayne Rust-Chester, AGACNP-BC Acute Care Nurse Practitioner [facility]   [phone] / [phone] Please see Amion for pager details.

## 2020-10-19 NOTE — Progress Notes (Signed)
Patient found with a blisters/redness/hives along the right side of abdomen/torso wrapping around to her back. NP called to bedside to assess for possible shingles. NP confirmed that she also suspects  shingles. See new orders for  airborne/contact isolation, valtrex and pharmacy consult.

## 2020-10-19 NOTE — Progress Notes (Signed)
Spoke with Margaret Shepard this morning to obtain telephone consent for blood products. Patients hgb is down to 6.9 and will need to be transfused. Alvira Philips did give verbal consent over the telephone to this nurse and it was was witnessed by 2nd RN, Jeanella Craze Do.

## 2020-10-20 DIAGNOSIS — Z7189 Other specified counseling: Secondary | ICD-10-CM | POA: Diagnosis not present

## 2020-10-20 DIAGNOSIS — J1282 Pneumonia due to coronavirus disease 2019: Secondary | ICD-10-CM | POA: Diagnosis not present

## 2020-10-20 DIAGNOSIS — J9600 Acute respiratory failure, unspecified whether with hypoxia or hypercapnia: Secondary | ICD-10-CM | POA: Diagnosis not present

## 2020-10-20 DIAGNOSIS — Z515 Encounter for palliative care: Secondary | ICD-10-CM | POA: Diagnosis not present

## 2020-10-20 DIAGNOSIS — U071 COVID-19: Secondary | ICD-10-CM | POA: Diagnosis not present

## 2020-10-20 LAB — BPAM RBC
Blood Product Expiration Date: 202201112359
ISSUE DATE / TIME: 202201101522
Unit Type and Rh: 9500

## 2020-10-20 LAB — BLOOD GAS, ARTERIAL
Acid-Base Excess: 4.1 mmol/L — ABNORMAL HIGH (ref 0.0–2.0)
Bicarbonate: 33 mmol/L — ABNORMAL HIGH (ref 20.0–28.0)
FIO2: 100
O2 Saturation: 95.9 %
PEEP: 10 cmH2O
Patient temperature: 37
RATE: 28 resp/min
pCO2 arterial: 77 mmHg (ref 32.0–48.0)
pH, Arterial: 7.24 — ABNORMAL LOW (ref 7.350–7.450)
pO2, Arterial: 94 mmHg (ref 83.0–108.0)

## 2020-10-20 LAB — RENAL FUNCTION PANEL
Albumin: 2.2 g/dL — ABNORMAL LOW (ref 3.5–5.0)
Anion gap: 10 (ref 5–15)
BUN: 36 mg/dL — ABNORMAL HIGH (ref 6–20)
CO2: 27 mmol/L (ref 22–32)
Calcium: 7.9 mg/dL — ABNORMAL LOW (ref 8.9–10.3)
Chloride: 100 mmol/L (ref 98–111)
Creatinine, Ser: 0.95 mg/dL (ref 0.44–1.00)
GFR, Estimated: >60 mL/min (ref >60)
Glucose, Bld: 126 mg/dL — ABNORMAL HIGH (ref 70–99)
Phosphorus: 3.1 mg/dL (ref 2.5–4.6)
Potassium: 3.9 mmol/L (ref 3.5–5.1)
Sodium: 137 mmol/L (ref 135–145)

## 2020-10-20 LAB — TYPE AND SCREEN
ABO/RH(D): O POS
Antibody Screen: NEGATIVE
Unit division: 0

## 2020-10-20 LAB — CBC
HCT: 27 % — ABNORMAL LOW (ref 36.0–46.0)
Hemoglobin: 8.9 g/dL — ABNORMAL LOW (ref 12.0–15.0)
MCH: 28.6 pg (ref 26.0–34.0)
MCHC: 33 g/dL (ref 30.0–36.0)
MCV: 86.8 fL (ref 80.0–100.0)
Platelets: 345 10*3/uL (ref 150–400)
RBC: 3.11 MIL/uL — ABNORMAL LOW (ref 3.87–5.11)
RDW: 14.9 % (ref 11.5–15.5)
WBC: 11.7 10*3/uL — ABNORMAL HIGH (ref 4.0–10.5)
nRBC: 0 % (ref 0.0–0.2)

## 2020-10-20 LAB — GLUCOSE, CAPILLARY
Glucose-Capillary: 110 mg/dL — ABNORMAL HIGH (ref 70–99)
Glucose-Capillary: 112 mg/dL — ABNORMAL HIGH (ref 70–99)
Glucose-Capillary: 122 mg/dL — ABNORMAL HIGH (ref 70–99)
Glucose-Capillary: 134 mg/dL — ABNORMAL HIGH (ref 70–99)
Glucose-Capillary: 161 mg/dL — ABNORMAL HIGH (ref 70–99)
Glucose-Capillary: 171 mg/dL — ABNORMAL HIGH (ref 70–99)

## 2020-10-20 LAB — MAGNESIUM: Magnesium: 1.3 mg/dL — ABNORMAL LOW (ref 1.7–2.4)

## 2020-10-20 LAB — TRIGLYCERIDES: Triglycerides: 104 mg/dL (ref <150)

## 2020-10-20 MED ORDER — SODIUM CHLORIDE 0.9 % IV SOLN
250.0000 mL | INTRAVENOUS | Status: DC
  Administered 2020-10-20: 250 mL via INTRAVENOUS

## 2020-10-20 MED ORDER — PROPOFOL 1000 MG/100ML IV EMUL
5.0000 ug/kg/min | INTRAVENOUS | Status: DC
  Administered 2020-10-20: 5 ug/kg/min via INTRAVENOUS
  Administered 2020-10-20: 40 ug/kg/min via INTRAVENOUS
  Administered 2020-10-21: 20 ug/kg/min via INTRAVENOUS
  Filled 2020-10-20 (×3): qty 100

## 2020-10-20 MED ORDER — HYDRALAZINE HCL 20 MG/ML IJ SOLN
20.0000 mg | Freq: Once | INTRAMUSCULAR | Status: DC
  Filled 2020-10-20: qty 1

## 2020-10-20 MED ORDER — PHENYLEPHRINE HCL-NACL 10-0.9 MG/250ML-% IV SOLN
25.0000 ug/min | INTRAVENOUS | Status: DC
  Administered 2020-10-20: 25 ug/min via INTRAVENOUS
  Administered 2020-10-21: 100 ug/min via INTRAVENOUS
  Filled 2020-10-20 (×2): qty 250

## 2020-10-20 MED ORDER — VECURONIUM BROMIDE 10 MG IV SOLR
10.0000 mg | Freq: Once | INTRAVENOUS | Status: AC
  Administered 2020-10-20: 10 mg via INTRAVENOUS

## 2020-10-20 MED ORDER — VALACYCLOVIR 50 MG/ML ORAL SUSPENSION
1000.0000 mg | Freq: Three times a day (TID) | ORAL | Status: DC
  Administered 2020-10-20 – 2020-10-21 (×3): 1000 mg
  Filled 2020-10-20 (×5): qty 20

## 2020-10-20 MED ORDER — MAGNESIUM SULFATE 2 GM/50ML IV SOLN: 2.0000 g | Freq: Once | INTRAVENOUS | Status: DC

## 2020-10-20 MED ORDER — MIDAZOLAM HCL 2 MG/2ML IJ SOLN
2.0000 mg | INTRAMUSCULAR | Status: DC | PRN
  Administered 2020-10-20 – 2020-10-21 (×3): 2 mg via INTRAVENOUS
  Filled 2020-10-20 (×2): qty 2

## 2020-10-20 MED ORDER — MAGNESIUM SULFATE 4 GM/100ML IV SOLN
4.0000 g | Freq: Once | INTRAVENOUS | Status: AC
  Administered 2020-10-20: 4 g via INTRAVENOUS
  Filled 2020-10-20: qty 100

## 2020-10-20 MED ORDER — MAGNESIUM SULFATE 4 GM/100ML IV SOLN
4.0000 g | Freq: Once | INTRAVENOUS | Status: DC
  Filled 2020-10-20: qty 100

## 2020-10-20 MED ORDER — QUETIAPINE FUMARATE 100 MG PO TABS
100.0000 mg | ORAL_TABLET | Freq: Two times a day (BID) | ORAL | Status: DC
  Administered 2020-10-20 – 2020-10-21 (×2): 100 mg
  Filled 2020-10-20 (×3): qty 1

## 2020-10-20 MED ORDER — MIDAZOLAM HCL 2 MG/2ML IJ SOLN
4.0000 mg | Freq: Once | INTRAMUSCULAR | Status: AC
  Administered 2020-10-20: 4 mg via INTRAVENOUS
  Filled 2020-10-20: qty 4

## 2020-10-20 NOTE — Progress Notes (Signed)
CRITICAL CARE NOTE 60 yo female admitted with acute hypoxic respiratory failure secondary to pneumonia due to COVID-19 requiring HHFNC and NRB now intubated and severe resp failure complicated by DVT and PE.  Treated with cefepime for HCAP Eventually required tracheostomy placement.   Significant Hospital Events:  12/16: Pt admitted to ICU with COVID-19 pneumonia requiring HHFNC and NRB remained in the ER pending ICU bed availability  12/17 high risk for cardiac arrest, on pressors 12/18 Off pressors 12/19 worsening renal function. Nephrology consult 09/28/20- patient was noted to be severely acidemic with HAGMA in am s/p 2 amp bicarb with repeat ABG significantly improved 12/21-patient will require HD, will place vascath today. Meds reviewed and changed, TG elevated on propofol. DCd IVF. 12/22- Patient remains critically ill. Reviewed plan with nephrologist, patient will not have HD today. 10/01/20-patient on 35%FIo2 8peep, she diruesed >3L we will hold HD, continue diuresis. SBT.  10/02/20-patient with tachyarrythmia overnight. S/p cardiology evaluation. For SBT today.  10/03/20-patient with no overnight events, remains critically ill on MV. Diuresed well overnight. Discussed care plan with nephrologist today.  10/04/20- Patient remains critically ill. She had SBT daily and has not been able to pass yet.  10/05/20-with no acute events overnight remains on PEEP of 8 and FiO2 of 55% 10/06/20-Patient on peep of 8 and FiO2 60%. Trying to weansedation but very difficult in spite of using oral medications 10/07/20-patient tolerated 5/5 trial for over 30 minutes. She was able to follow commands today. She was able to wiggle her right toes. She was able to stick out her tongue. 10/08/2020-Lightly sedated, able to follow some simple commands. Currently on 50% FiO2 &8 PEEP, increased work of breathing on vent. Consult ENT for Trach. 10/09/2020-Pt very asynchronous with vent,  increasing sedation with prn Paralytics. Begin Recruitment maneuvers and Diurese today; ENT evaluated pt, tentative plan for trach some time next week 10/10/2020-switch to [facility], adjusted ventilator settings 10/11/2020-ongoing issues with ventilator asynchrony no matter what mode utilized. Requiring intermittent paralytics.  10/13/19-updated [name] (POA). Plan for palliative care evaluation. Patient still critically ill after 17d. Poor prognosis  10/14/19- Patient continues to be critically ill. I spoke with [name] and we discussed tracheostomy. She met with palliative care 1/5 failure to wean from vent 1/6 s/p trach 1/9 started fentanyl patch, wean down Dilaudid.  On pressure support weans. 1/10 Dx shingles 1/11 increased WOB, placed on full vent support   CC  follow up respiratory failure  SUBJECTIVE Full vent support +infection with shingles +pain   BP (!) 159/82   Pulse (!) 121   Temp (!) 100.8 F (38.2 C) (Axillary)   Resp (!) 34   Ht '5\' 4"'  (1.626 m)   Wt 64.5 kg   SpO2 93%   BMI 24.41 kg/m    I/O last 3 completed shifts: In: 3556.6 [I.V.:1282.1; Blood:322; NG/GT:1952.5] Out: 4967 [RFFMB:8466] No intake/output data recorded.  SpO2: 93 % O2 Flow Rate (L/min): 15 L/min FiO2 (%): 65 %  Estimated body mass index is 24.41 kg/m as calculated from the following:   Height as of this encounter: '5\' 4"'  (1.626 m).   Weight as of this encounter: 64.5 kg.  SIGNIFICANT EVENTS   REVIEW OF SYSTEMS  PATIENT IS UNABLE TO PROVIDE COMPLETE REVIEW OF SYSTEMS DUE TO SEVERE CRITICAL ILLNESS   Pressure Injury 10/09/20 Buttocks Left;Posterior;Proximal Stage 2 -  Partial thickness loss of dermis presenting as a shallow open injury with a red, pink wound bed without slough. gluetal fold (Active)  10/09/20 0810  Location: Buttocks  Location Orientation: Left;Posterior;Proximal  Staging: Stage 2 -  Partial thickness loss of dermis presenting as a shallow open injury with  a red, pink wound bed without slough.  Wound Description (Comments): gluetal fold  Present on Admission:       PHYSICAL EXAMINATION: Blood pressure 117/74, pulse 93, temperature 99.2 F (37.3 C), temperature source Axillary, resp. rate (!) 30, height '5\' 4"'  (1.626 m), weight 63.7 kg, SpO2 96 %. Gen:      +resp distress Neck:     S/p trach Lungs:    Clear to auscultation bilaterally; normal respiratory effort CV:         Regular rate and rhythm; no murmurs Skin:      +rash Neuro: Awake, nods to questions   MEDICATIONS: I have reviewed all medications and confirmed regimen as documented   CULTURE RESULTS   Recent Results (from the past 240 hour(s))  CULTURE, BLOOD (ROUTINE X 2) w Reflex to ID Panel     Status: None   Collection Time: 10/12/20  9:46 AM   Specimen: BLOOD RIGHT HAND  Result Value Ref Range Status   Specimen Description BLOOD RIGHT HAND  Final   Special Requests   Final    BOTTLES DRAWN AEROBIC AND ANAEROBIC Blood Culture adequate volume   Culture   Final    NO GROWTH 5 DAYS Performed at [facility], [address] [place], [place] [address]    Report Status 10/17/2020 FINAL  Final  CULTURE, BLOOD (ROUTINE X 2) w Reflex to ID Panel     Status: None   Collection Time: 10/12/20  9:46 AM   Specimen: BLOOD RIGHT ARM  Result Value Ref Range Status   Specimen Description BLOOD RIGHT ARM  Final   Special Requests   Final    BOTTLES DRAWN AEROBIC AND ANAEROBIC Blood Culture adequate volume   Culture   Final    NO GROWTH 5 DAYS Performed at [facility], [address] [place], [place] [address]    Report Status 10/17/2020 FINAL  Final          IMAGING    No results found.   Nutrition Status: Nutrition Problem: Inadequate oral intake Etiology: inability to eat Signs/Symptoms: NPO status Interventions: Tube feeding,Prostat,MVI        Indwelling Urinary Catheter continued, requirement due to   Reason to continue Indwelling  Urinary Catheter strict Intake/Output monitoring for hemodynamic instability   Central Line/ continued, requirement due to  Reason to continue [place] of central venous pressure or other hemodynamic parameters and poor IV access   Ventilator continued, requirement due to severe respiratory failure   Ventilator Sedation RASS 0 to -2     ASSESSMENT AND PLAN SYNOPSIS  Acute hypoxemic respiratory failure due to COVID-19 pneumonia failure to wean from vent due to ARDS Acute hypoxic respiratory failure secondary to COVID-19 pneumonia with ARDS, and small Right Upper Lobe PE and bilateral DVT,S/p TRACH failure to wean from VENT   Severe ACUTE Hypoxic and Hypercapnic Respiratory Failure Continue mechanical support   ACUTE DIASTOLIC CARDIAC FAILURE-  -oxygen as needed -Lasix as tolerated   Morbid obesity, possible OSA.   S/p trach   ACUTE KIDNEY INJURY/Renal Failure Nephrology on board.  No need for dialysis at present Follow-up renal function    NEUROLOGY On Dilaudid and Precedex drip Added propofol Started fentanyl patch to titrate off Dilaudid drip. Continue OxyContin  CARDIAC ICU monitoring   Small PE, bilateral DVT Continue  Eliquis-may hold for bleeding trach site   GI GI PROPHYLAXIS as indicated Plan for PEG tube  DIET-->TF's as tolerated Constipation protocol as indicated  ENDO - will use ICU hypoglycemic\Hyperglycemia protocol if indicated   ELECTROLYTES -follow labs as needed -replace as needed -pharmacy consultation and following  Aunt updated over telephone 1/8 and 1/9  DVT/GI PRX ordered and assessed TRANSFUSIONS AS NEEDED MONITOR FSBS I Assessed the need for Labs I Assessed the need for Foley I Assessed the need for Central Venous Line Family Discussion when available I Assessed the need for Mobilization I made an Assessment of medications to be adjusted accordingly Safety Risk assessment completed   CASE DISCUSSED IN  MULTIDISCIPLINARY ROUNDS WITH ICU TEAM   Critical Care Time devoted to patient care services described in this note is 45 minutes.   Overall, patient is critically ill, prognosis is guarded.     Corrin Parker, M.D.  [facility]  Medical Director [place] Director [facility] Cardio-Pulmonary Department

## 2020-10-20 NOTE — Progress Notes (Signed)
PHARMACY CONSULT NOTE - FOLLOW UP  Pharmacy Consult for Electrolyte Monitoring and Replacement   Recent Labs: Potassium (mmol/L)  Date Value  10/20/2020 3.9  07/08/2012 3.7   Magnesium (mg/dL)  Date Value  14/43/1540 1.3 (L)  07/08/2012 1.6 (L)   Calcium (mg/dL)  Date Value  08/67/6195 7.9 (L)   Calcium, Total (mg/dL)  Date Value  09/32/6712 9.1   Albumin (g/dL)  Date Value  45/80/9983 2.2 (L)  07/08/2012 4.2   Phosphorus (mg/dL)  Date Value  38/25/0539 3.1   Sodium (mmol/L)  Date Value  10/20/2020 137  07/08/2012 138     Assessment: 1/11:  Mag @ 0141 = 1.3  Goal of Therapy:  Electrolytes WNL   Plan:  Will order Magnesium sulfate 4 gm IV X 1 for 01/11 @ 0700.   Scherrie Gerlach ,PharmD Clinical Pharmacist 10/20/2020 6:46 AM

## 2020-10-20 NOTE — Progress Notes (Signed)
Palliative:  HPI:59 y.o.femalewith past medical history of headache, seizures, anemia, hypertension, chronic back pain on chronic opioids, anxietyadmitted on12/16/2021with COVID pneumonia.Hospitalization complicated by ongoing respiratory failure requiring prolonged vent support, renal failure (not needing dialysis), RUL PE/bilateral DVT. Tentative plans for tracheostomy placement.  I discussed Margaret Shepard today with Dr. Belia Heman and RN. She now has shingles and is more uncomfortable => needing more medication/sedation => needing more support from vent. Unfortunately this is another setback. She was having some progression but now struggling again. I called and spoke with aunt Margaret Shepard and explained the above. She understands. She shares that she has had shingles before so she understands how painful this is for Margaret Shepard and hates she has to go through this on top of everything else she has had to go through. Margaret Shepard says family is still hopeful. She continues to ask if Margaret Shepard still has a chance of coming off ventilator eventually and I report that we cannot know for sure but this is a possibility. Family wish to continue with aggressive care if still hopes of improvement. She was making some progress prior to shingles infection so family hoping she can recover from this and continue to progress. Her pain and discomfort continue to be a huge barrier for her. They also are open to comfort care if we do not feel there is a chance that Margaret Shepard can ultimately come off of ventilator and have some improvement.   All questions/concerns addressed. Emotional support provided.   Exam: Ill-appearing. Sedated on vent. No dyssynchrony on vent - tolerating. Grimacing, appears uncomfortable but sedated. Tachycardia. Vent 70% FiO2.   Plan: - Continue support. Family hopeful she will have improvement.   20 min   Yong Channel, NP Palliative Medicine Team Pager [phone] (Please see [email or web address] for  schedule) Team Phone [phone]    Greater than 50%  of this time was spent counseling and coordinating care related to the above assessment and plan

## 2020-10-20 NOTE — Progress Notes (Signed)
Patient experiencing increase WOB and agitation. Patient become tachypneic and hypoxic with SpO2s 70s-80s. Ventilator settings adjusted, increased FiO2 to 100% and PEEP increased to 15. Scant amount of ETT and oral secretions. Minimal improvement in resp status with these changes. Patient tachycardic in the 140-150s, with SBPs 190-200s. Dr. Belia Heman and Sonda Rumble, NP notified and verbal orders for dilaudid bolus via pump, versed, and vecuronium provided. Increased propofol and precedex infusions. NP and RT at bedside to assess.   Handoff given to night shift RN. Patient tachycardic and hypertensive at this time. SpO2 improving in the 90s.

## 2020-10-21 DIAGNOSIS — J9601 Acute respiratory failure with hypoxia: Secondary | ICD-10-CM | POA: Diagnosis not present

## 2020-10-21 DIAGNOSIS — Z515 Encounter for palliative care: Secondary | ICD-10-CM | POA: Diagnosis not present

## 2020-10-21 DIAGNOSIS — J1282 Pneumonia due to coronavirus disease 2019: Secondary | ICD-10-CM | POA: Diagnosis not present

## 2020-10-21 DIAGNOSIS — U071 COVID-19: Secondary | ICD-10-CM | POA: Diagnosis not present

## 2020-10-21 DIAGNOSIS — Z7189 Other specified counseling: Secondary | ICD-10-CM | POA: Diagnosis not present

## 2020-10-21 LAB — URINALYSIS, COMPLETE (UACMP) WITH MICROSCOPIC
Bilirubin Urine: NEGATIVE
Glucose, UA: 50 mg/dL — AB
Ketones, ur: NEGATIVE mg/dL
Nitrite: POSITIVE — AB
Protein, ur: NEGATIVE mg/dL
RBC / HPF: >50 RBC/hpf — ABNORMAL HIGH (ref 0–5)
Specific Gravity, Urine: 1.01 (ref 1.005–1.030)
WBC, UA: >50 WBC/hpf — ABNORMAL HIGH (ref 0–5)
pH: 5 (ref 5.0–8.0)

## 2020-10-21 LAB — CBC WITH DIFFERENTIAL/PLATELET
Abs Immature Granulocytes: 0.23 10*3/uL — ABNORMAL HIGH (ref 0.00–0.07)
Basophils Absolute: 0.1 10*3/uL (ref 0.0–0.1)
Basophils Relative: 0 %
Eosinophils Absolute: 0.2 10*3/uL (ref 0.0–0.5)
Eosinophils Relative: 1 %
HCT: 22.1 % — ABNORMAL LOW (ref 36.0–46.0)
Hemoglobin: 7 g/dL — ABNORMAL LOW (ref 12.0–15.0)
Immature Granulocytes: 1 %
Lymphocytes Relative: 8 %
Lymphs Abs: 1.5 10*3/uL (ref 0.7–4.0)
MCH: 28.5 pg (ref 26.0–34.0)
MCHC: 31.7 g/dL (ref 30.0–36.0)
MCV: 89.8 fL (ref 80.0–100.0)
Monocytes Absolute: 0.8 10*3/uL (ref 0.1–1.0)
Monocytes Relative: 5 %
Neutro Abs: 15.7 10*3/uL — ABNORMAL HIGH (ref 1.7–7.7)
Neutrophils Relative %: 85 %
Platelets: 304 10*3/uL (ref 150–400)
RBC: 2.46 MIL/uL — ABNORMAL LOW (ref 3.87–5.11)
RDW: 14.9 % (ref 11.5–15.5)
WBC: 18.5 10*3/uL — ABNORMAL HIGH (ref 4.0–10.5)
nRBC: 0 % (ref 0.0–0.2)

## 2020-10-21 LAB — RENAL FUNCTION PANEL
Albumin: 1.9 g/dL — ABNORMAL LOW (ref 3.5–5.0)
Anion gap: 9 (ref 5–15)
BUN: 33 mg/dL — ABNORMAL HIGH (ref 6–20)
CO2: 27 mmol/L (ref 22–32)
Calcium: 7.6 mg/dL — ABNORMAL LOW (ref 8.9–10.3)
Chloride: 100 mmol/L (ref 98–111)
Creatinine, Ser: 1.08 mg/dL — ABNORMAL HIGH (ref 0.44–1.00)
GFR, Estimated: 59 mL/min — ABNORMAL LOW (ref >60)
Glucose, Bld: 177 mg/dL — ABNORMAL HIGH (ref 70–99)
Phosphorus: 4.5 mg/dL (ref 2.5–4.6)
Potassium: 4.1 mmol/L (ref 3.5–5.1)
Sodium: 136 mmol/L (ref 135–145)

## 2020-10-21 LAB — BLOOD GAS, ARTERIAL
Acid-Base Excess: 4.2 mmol/L — ABNORMAL HIGH (ref 0.0–2.0)
Bicarbonate: 30.4 mmol/L — ABNORMAL HIGH (ref 20.0–28.0)
FIO2: 100
O2 Saturation: 97.4 %
PEEP: 10 cmH2O
Patient temperature: 37
Pressure control: 30 cmH2O
RATE: 30 resp/min
pCO2 arterial: 55 mmHg — ABNORMAL HIGH (ref 32.0–48.0)
pH, Arterial: 7.35 (ref 7.350–7.450)
pO2, Arterial: 100 mmHg (ref 83.0–108.0)

## 2020-10-21 LAB — LACTIC ACID, PLASMA
Lactic Acid, Venous: 1.3 mmol/L (ref 0.5–1.9)
Lactic Acid, Venous: 2.4 mmol/L (ref 0.5–1.9)

## 2020-10-21 LAB — GLUCOSE, CAPILLARY
Glucose-Capillary: 148 mg/dL — ABNORMAL HIGH (ref 70–99)
Glucose-Capillary: 161 mg/dL — ABNORMAL HIGH (ref 70–99)
Glucose-Capillary: 180 mg/dL — ABNORMAL HIGH (ref 70–99)

## 2020-10-21 LAB — MAGNESIUM: Magnesium: 2.2 mg/dL (ref 1.7–2.4)

## 2020-10-21 MED ORDER — NOREPINEPHRINE 4 MG/250ML-% IV SOLN: 0.0000 ug/min | INTRAVENOUS | Status: DC

## 2020-10-21 MED ORDER — PHENYLEPHRINE CONCENTRATED 100MG/250ML (0.4 MG/ML) INFUSION SIMPLE
25.0000 ug/min | INTRAVENOUS | Status: DC
  Administered 2020-10-21: 25 ug/min via INTRAVENOUS
  Filled 2020-10-21: qty 250

## 2020-10-21 MED ORDER — NOREPINEPHRINE 4 MG/250ML-% IV SOLN
INTRAVENOUS | Status: AC
  Administered 2020-10-21: 4 mg
  Filled 2020-10-21: qty 250

## 2020-10-21 MED ORDER — MIDAZOLAM BOLUS VIA INFUSION
2.0000 mg | INTRAVENOUS | Status: DC | PRN
  Filled 2020-10-21: qty 4

## 2020-10-21 MED ORDER — NOREPINEPHRINE 16 MG/250ML-% IV SOLN
0.0000 ug/min | INTRAVENOUS | Status: DC
  Filled 2020-10-21: qty 250

## 2020-10-21 MED ORDER — HYDROMORPHONE BOLUS VIA INFUSION
4.0000 mg | INTRAVENOUS | Status: DC | PRN
  Filled 2020-10-21: qty 4

## 2020-10-26 LAB — CULTURE, BLOOD (ROUTINE X 2): Culture: NO GROWTH

## 2020-11-04 LAB — BLOOD GAS, VENOUS
Acid-base deficit: 4.6 mmol/L — ABNORMAL HIGH (ref 0.0–2.0)
Bicarbonate: 22.1 mmol/L (ref 20.0–28.0)
O2 Saturation: 89.4 %
Patient temperature: 37
pCO2, Ven: 47 mmHg (ref 44.0–60.0)
pH, Ven: 7.28 (ref 7.250–7.430)
pO2, Ven: 65 mmHg — ABNORMAL HIGH (ref 32.0–45.0)

## 2020-11-10 NOTE — Progress Notes (Signed)
Notified by RN that patient was hypotensive w/systolics in the mid 80s. A neo gtt was initiated. Levophed was added when the patient remained hypotensive w/systolics in the 70s on Neo at 133mcg/min. Instructed nurse to go up on the neo and okay to add levophed gtt.   Propofol at 73mcg/kg/min, Dilaudid 3mg /hr, Precedex at 1.2 and Versed at 2mg /hr. Per RN, she restarted the versed gtt at the beginning of the shift due to breath stacking and agitation.   Patient seen and examined. Propofol turned down to 69mcg/kg/min, Versed increased to 4mg /hr, Dilaudid and Precedex remain the same. Levophed currently running at and Neo at 30m with a BP of 107/55. Oxygen saturation has improved to 99% over the course of the night from 90%.   Patient has remained afebrile. WBC 11 yesterday and CBC pending for this AM. I suspect her high requirements of sedatives is the cause of the hypotension. She improved with decreasing the Propofol gtt. Will hold off on cultures and antibiotics as an infectious etiology is not suspected at this time. Low threshold to culture and initiate antibiotics if pressor requirements worsen. Will add a lactate on to morning labs.    

## 2020-11-10 NOTE — Progress Notes (Signed)
CRITICAL CARE NOTE  60 yo female admitted with acute hypoxic respiratory failure secondary to pneumonia due to COVID-19 requiring HHFNC and NRB now intubated and severe resp failure complicated by DVT and PE.  Treated with cefepime for HCAP Eventually required tracheostomy placement.   Significant Hospital Events:  12/16: Pt admitted to ICU with COVID-19 pneumonia requiring HHFNC and NRB remained in the ER pending ICU bed availability  12/17 high risk for cardiac arrest, on pressors 12/18 Off pressors 12/19 worsening renal function. Nephrology consult 09/28/20- patient was noted to be severely acidemic with HAGMA in am s/p 2 amp bicarb with repeat ABG significantly improved 12/21-patient will require HD, will place vascath today. Meds reviewed and changed, TG elevated on propofol. DCd IVF. 12/22- Patient remains critically ill. Reviewed plan with nephrologist, patient will not have HD today. 10/01/20-patient on 35%FIo2 8peep, she diruesed >3L we will hold HD, continue diuresis. SBT.  10/02/20-patient with tachyarrythmia overnight. S/p cardiology evaluation. For SBT today.  10/03/20-patient with no overnight events, remains critically ill on MV. Diuresed well overnight. Discussed care plan with nephrologist today.  10/04/20- Patient remains critically ill. She had SBT daily and has not been able to pass yet.  10/05/20-with no acute events overnight remains on PEEP of 8 and FiO2 of 55% 10/06/20-Patient on peep of 8 and FiO2 60%. Trying to weansedation but very difficult in spite of using oral medications 10/07/20-patient tolerated 5/5 trial for over 30 minutes. She was able to follow commands today. She was able to wiggle her right toes. She was able to stick out her tongue. 10/08/2020-Lightly sedated, able to follow some simple commands. Currently on 50% FiO2 &8 PEEP, increased work of breathing on vent. Consult ENT for Trach. 10/09/2020-Pt very asynchronous with vent,  increasing sedation with prn Paralytics. Begin Recruitment maneuvers and Diurese today; ENT evaluated pt, tentative plan for trach some time next week 10/10/2020-switch to [facility], adjusted ventilator settings 10/11/2020-ongoing issues with ventilator asynchrony no matter what mode utilized. Requiring intermittent paralytics.  10/13/19-updated [name] (POA). Plan for palliative care evaluation. Patient still critically ill after 17d. Poor prognosis  10/14/19- Patient continues to be critically ill. I spoke with [name] and we discussed tracheostomy. She met with palliative care 1/74failure to wean from vent 1/6 s/p trach 1/9 started fentanyl patch, wean down Dilaudid.  On pressure support weans. 1/10 Dx shingles 1/11 increased WOB, placed on full vent support 1/12 severe resp failure   CC  follow up respiratory failure  SUBJECTIVE Patient remains critically ill Prognosis is guarded S/p trach full vent support +shingles Low h/h probable active bleeding Obtain CT abd pelvis to assess bleed    BP 101/65   Pulse 86   Temp 97.6 F (36.4 C)   Resp 19   Ht 5' 4.02" (1.626 m)   Wt 64.5 kg   SpO2 98%   BMI 24.40 kg/m    I/O last 3 completed shifts: In: 4720.9 [I.V.:2230.5; NG/GT:2310; IV Piggyback:180.3] Out: 2458 [Urine:4405] No intake/output data recorded.  SpO2: 98 % O2 Flow Rate (L/min): 15 L/min FiO2 (%): 100 %  Estimated body mass index is 24.4 kg/m as calculated from the following:   Height as of this encounter: 5' 4.02" (1.626 m).   Weight as of this encounter: 64.5 kg.  SIGNIFICANT EVENTS   REVIEW OF SYSTEMS  PATIENT IS UNABLE TO PROVIDE COMPLETE REVIEW OF SYSTEMS DUE TO SEVERE CRITICAL ILLNESS   Pressure Injury 10/09/20 Buttocks Left;Posterior;Proximal Stage 2 -  Partial thickness loss of dermis presenting as  a shallow open injury with a red, pink wound bed without slough. gluetal fold (Active)  10/09/20 0810  Location: Buttocks  Location  Orientation: Left;Posterior;Proximal  Staging: Stage 2 -  Partial thickness loss of dermis presenting as a shallow open injury with a red, pink wound bed without slough.  Wound Description (Comments): gluetal fold  Present on Admission:       PHYSICAL EXAMINATION:  GENERAL:critically ill appearing, +resp distress HEAD: Normocephalic, atraumatic.  EYES: Pupils equal, round, reactive to light.  No scleral icterus.  MOUTH: Moist mucosal membrane. NECK: Supple. S/p trach PULMONARY: +rhonchi, +wheezing CARDIOVASCULAR: S1 and S2. Regular rate and rhythm. No murmurs, rubs, or gallops.  GASTROINTESTINAL: Soft, nontender, -distended.  Positive bowel sounds.   MUSCULOSKELETAL: No swelling, clubbing, or edema.  NEUROLOGIC: obtunded, GCS<8 SKIN:+shingles   MEDICATIONS: I have reviewed all medications and confirmed regimen as documented   CULTURE RESULTS   Recent Results (from the past 240 hour(s))  CULTURE, BLOOD (ROUTINE X 2) w Reflex to ID Panel     Status: None   Collection Time: 10/12/20  9:46 AM   Specimen: BLOOD RIGHT HAND  Result Value Ref Range Status   Specimen Description BLOOD RIGHT HAND  Final   Special Requests   Final    BOTTLES DRAWN AEROBIC AND ANAEROBIC Blood Culture adequate volume   Culture   Final    NO GROWTH 5 DAYS Performed at [facility], [address] [place], [place] [address]    Report Status 10/17/2020 FINAL  Final  CULTURE, BLOOD (ROUTINE X 2) w Reflex to ID Panel     Status: None   Collection Time: 10/12/20  9:46 AM   Specimen: BLOOD RIGHT ARM  Result Value Ref Range Status   Specimen Description BLOOD RIGHT ARM  Final   Special Requests   Final    BOTTLES DRAWN AEROBIC AND ANAEROBIC Blood Culture adequate volume   Culture   Final    NO GROWTH 5 DAYS Performed at [facility], [address], [place],  [address]    Report Status 10/17/2020 FINAL  Final          IMAGING    No results found.   Nutrition  Status: Nutrition Problem: Inadequate oral intake Etiology: inability to eat Signs/Symptoms: NPO status Interventions: Tube feeding,Prostat,MVI     Indwelling Urinary Catheter continued, requirement due to   Reason to continue Indwelling Urinary Catheter strict Intake/Output monitoring for hemodynamic instability   Central Line/ continued, requirement due to  Reason to continue [place] of central venous pressure or other hemodynamic parameters and poor IV access   Ventilator continued, requirement due to severe respiratory failure   Ventilator Sedation RASS 0 to -2      ASSESSMENT AND PLAN SYNOPSIS  Acute hypoxemic respiratory failure due to COVID-19 pneumoniafailure to wean from vent due toARDS Acute hypoxic respiratory failure secondary to COVID-19 pneumonia with ARDS, and small Right Upper Lobe PE and bilateral DVT,S/p TRACH failure to wean from VENT  Back on full vent support  LOW H/H low BP, high HR On oral AC, will need to stop Patient with probable active bleeding    Severe ACUTE Hypoxic and Hypercapnic Respiratory Failure -continue Full MV support -continue Bronchodilator Therapy -Wean Fio2 and PEEP as tolerated -VAP/VENT bundle implementation   ACUTE DIASTOLIC CARDIAC FAILURE- -oxygen as needed -Lasix as tolerated   NEUROLOGY Acute toxic metabolic encephalopathy, need for sedation Goal RASS -2 to -3  Small PE, bilateral DVT Continue  Eliquis-may hold for bleeding trach site   CARDIAC ICU monitoring  ID -continue IV abx as prescibed -follow up cultures  GI GI PROPHYLAXIS as indicated  NUTRITIONAL STATUS Nutrition Status: Nutrition Problem: Inadequate oral intake Etiology: inability to eat Signs/Symptoms: NPO status Interventions: Tube feeding,Prostat,MVI   DIET-->TF's as tolerated Constipation protocol as indicated  ENDO - will use ICU hypoglycemic\Hyperglycemia protocol if indicated     ELECTROLYTES -follow  labs as needed -replace as needed -pharmacy consultation and following   DVT/GI PRX ordered and assessed TRANSFUSIONS AS NEEDED MONITOR FSBS I Assessed the need for Labs I Assessed the need for Foley I Assessed the need for Central Venous Line Family Discussion when available I Assessed the need for Mobilization I made an Assessment of medications to be adjusted accordingly Safety Risk assessment completed   CASE DISCUSSED IN MULTIDISCIPLINARY ROUNDS WITH ICU TEAM  Critical Care Time devoted to patient care services described in this note is 45  minutes.    patient is DNR Patient is suffering  Corrin Parker, M.D.  [facility]  Medical Director [place] Director [facility] Cardio-Pulmonary Department

## 2020-11-10 NOTE — Progress Notes (Addendum)
Palliative:  HPI:59 y.o.femalewith past medical history of headache, seizures, anemia, hypertension, chronic back pain on chronic opioids, anxietyadmitted on12/16/2021with COVID pneumonia.Hospitalization complicated by ongoing respiratory failure requiring prolonged vent support, renal failure (not needing dialysis), RUL PE/bilateral DVT. Tracheostomy placemed.Now with shingles infection.   I met today with Margaret Shepard and discussed with RN and Dr. Mortimer Fries. She has had acute and steady decline over the past 2 days. She is requiring max vent support and vasopressors. She is needing increasing support throughout the morning. Concern also that she is suffering with extreme pain from chronic back and shingles infection. We also worry about hemoglobin dropping and potential bleed but also with known DVT/PE.   I called and discussed decline with Margaret Shepard and that we are concerned with her level of suffering and that we are no longer in a place where we feel she will recover to an acceptable quality of life. We discussed plan to transition to comfort care and one way extubation when family at bedside and allowed to say goodbye.   I returned to bedside where Margaret Shepard was made comfortable and liberalized from ventilator. Multiple family members present for support. She is greatly loved as evident by the loving family holding vigil. Offered support to family at bedside. Time of death 1332 pronounced by myself and RN Caryl Pina. Family notified. Personal pastors present. She was peaceful and restful after removed from ventilator and died peacefully.   Exam: Asystole/PEA arrest. No heart or breathe sounds heard on auscultation. No breathes noted.   Plan: - Peaceful hospital death with family at bedside holding her hand.  - Rest in peace.   [place], NP Palliative Medicine Team Pager [phone] (Please see [email or web address] for schedule) Team Phone [phone]    Greater than 50%  of this time was  spent counseling and coordinating care related to the above assessment and plan

## 2020-11-10 NOTE — TOC Progression Note (Addendum)
Transition of Care [facility]) - Progression Note    Patient Details  Name: Margaret Shepard MRN: [id number] Date of Birth: 05/07/1961  Transition of Care [facility]) CM/SW Contact  Marina Goodell Phone Number:  [phone] 10/30/2020, 12:03 PM  Clinical Narrative:     Patient DNR, family open to comfort care. TOC will continue to follow.        Expected Discharge Plan and Services                                                 Social Determinants of Health (SDOH) Interventions    Readmission Risk Interventions No flowsheet data found.

## 2020-11-10 NOTE — Death Summary Note (Signed)
DEATH SUMMARY   Patient Details  Name: Margaret Shepard MRN: [id number] DOB: [birth year]  Admission/Discharge Information   Admit Date:  23-Oct-2020  Date of Death:   2020-11-19   Time of Death:  29  Length of Stay: 02-02-23  Referring Physician: Venita Lick, NP   Reason(s) for Hospitalization  COVID 19 pnuemonia  Diagnoses  Preliminary cause of death: COVID 67 pnuemonia Secondary Diagnoses (including complications and co-morbidities):  Active Problems:   Pneumonia due to COVID-19 virus   Pressure injury of skin   Brief Hospital Course (including significant findings, care, treatment, and services provided and events leading to death)  60 yo female admitted with acute hypoxic respiratory failure secondary to pneumonia due to COVID-19 requiring HHFNC and NRB now intubated and severe resp failure complicated by DVT and PE. Treated with cefepime for HCAP Eventually required tracheostomy placement.   Significant Hospital Events:  Oct 24, 2023: Pt admitted to ICU with COVID-19 pneumonia requiring HHFNC and NRB remained in the ER pending ICU bed availability  12/17 high risk for cardiac arrest, on pressors 12/18 Off pressors 12/19 worsening renal function. Nephrology consult 09/28/20- patient was noted to be severely acidemic with HAGMA in am s/p 2 amp bicarb with repeat ABG significantly improved 12/21-patient will require HD, will place vascath today. Meds reviewed and changed, TG elevated on propofol. DCd IVF. 12/22- Patient remains critically ill. Reviewed plan with nephrologist, patient will not have HD today. 10/01/20-patient on 35%FIo2 8peep, she diruesed >3L we will hold HD, continue diuresis. SBT.  10/02/20-patient with tachyarrythmia overnight. S/p cardiology evaluation. For SBT today.  10/03/20-patient with no overnight events, remains critically ill on MV. Diuresed well overnight. Discussed care plan with nephrologist today.  10/04/20- Patient remains  critically ill. She had SBT daily and has not been able to pass yet.  10/05/20-with no acute events overnight remains on PEEP of 8 and FiO2 of 55% 10/06/20-Patient on peep of 8 and FiO2 60%. Trying to weansedation but very difficult in spite of using oral medications 10/07/20-patient tolerated 5/5 trial for over 30 minutes. She was able to follow commands today. She was able to wiggle her right toes. She was able to stick out her tongue. 10/08/2020-Lightly sedated, able to follow some simple commands. Currently on 50% FiO2 &8 PEEP, increased work of breathing on vent. Consult ENT for Trach. 10/09/2020-Pt very asynchronous with vent, increasing sedation with prn Paralytics. Begin Recruitment maneuvers and Diurese today; ENT evaluated pt, tentative plan for trach some time next week 10/10/2020-switch to [facility], adjusted ventilator settings 10/11/2020-ongoing issues with ventilator asynchrony no matter what mode utilized. Requiring intermittent paralytics.  10/13/19-updated [name] (POA). Plan for palliative care evaluation. Patient still critically ill after 17d. Poor prognosis  10/14/19- Patient continues to be critically ill. I spoke with [name] and we discussed tracheostomy. She met with palliative care 1/34filure to wean from vent 1/6s/p trach 1/9started fentanyl patch, wean down Dilaudid. On pressure support weans. 1/10 Dx shingles 1/11 increased WOB, placed on full vent support [id number] resp failure, patient with acute decompensation  GOALS OF CARE DISCUSSION  The Clinical status was relayed to family in detail.  Updated and notified of patients medical condition.  Patient remains unresponsive and will not open eyes to command.   Upon assessment his breath sounds are course crackles with significant secretions to oral pharyngeal region.  Nasopharyngeal suction produced copious sanguineous secretions.    Patient is having a weak cough and struggling to remove  secretions.   patient with increased WOB  and using accessory muscles to breathe Explained to family course of therapy and the modalities     Patient with Progressive multiorgan failure with very low chance of meaningful recovery despite all aggressive and optimal medical therapy. Patient is in the Dying  Process associated with Suffering.  Family understands the situation.  They have consented and agreed to DNR/DNI and would like to proceed with Comfort care measures.  Family are satisfied with Plan of action and management. All questions answered        Pertinent Labs and Studies  Significant Diagnostic Studies DG Abd 1 View  Result Date: 11/01/2020 CLINICAL DATA:  60 year old female with nasogastric tube placement EXAM: ABDOMEN - 1 VIEW COMPARISON:  09/25/2020 FINDINGS: Nasogastric tube terminates in the left upper quadrant within the stomach. Stomach is decompressed. No significant small bowel gas. Minimal gas within the colon without distention. No air-fluid levels. IMPRESSION: Limited plain film of the abdomen demonstrates nasogastric tube terminating in the stomach which is decompressed. Electronically Signed   By: Corrie Mckusick D.O.   On: 10/19/2020 15:16   DG Abd 1 View  Result Date: 09/25/2020 CLINICAL DATA:  Tube placement EXAM: ABDOMEN - 1 VIEW COMPARISON:  None. FINDINGS: NG tube tip is in the distal stomach. Bilateral airspace disease again noted. IMPRESSION: NG tube tip in the distal stomach. Electronically Signed   By: Rolm Baptise M.D.   On: 09/25/2020 03:54   CT ANGIO CHEST PE W OR WO CONTRAST  Result Date: 09/25/2020 CLINICAL DATA:  Acute hypoxic respiratory failure secondary to COVID-19 EXAM: CT ANGIOGRAPHY CHEST WITH CONTRAST TECHNIQUE: Multidetector CT imaging of the chest was performed using the standard protocol during bolus administration of intravenous contrast. Multiplanar CT image reconstructions and MIPs were obtained to evaluate the vascular anatomy.  CONTRAST:  51m OMNIPAQUE IOHEXOL 350 MG/ML SOLN COMPARISON:  Portable chest 09/26/2019 FINDINGS: Cardiovascular: Small pulmonary embolism right upper lobe pulmonary arteries in a distal pulmonary artery. Two small branch pulmonary emboli right upper lobe. No central pulmonary embolism. Pulmonary arteries normal in caliber. Pulmonary arteries normal in caliber. Negative thoracic aorta. Heart size within normal limits. Minimal pericardial thickening. Mediastinum/Nodes: Endotracheal tube in good position. NG tube in the stomach. No mediastinal mass or hematoma. Lungs/Pleura: Extensive diffuse bilateral airspace disease compatible with COVID pneumonia. Bibasilar atelectasis. No significant pleural effusion. Upper Abdomen: No acute abnormality. Musculoskeletal: No acute skeletal abnormality. Review of the MIP images confirms the above findings. IMPRESSION: Small pulmonary embolism right upper lobe. Diffuse bilateral airspace disease compatible with COVID pneumonia. Bibasilar atelectasis. These results will be called to the ordering clinician or representative by the Radiologist Assistant, and communication documented in the PACS or [employer] Electronically Signed   By: CFranchot GalloM.D.   On: 09/25/2020 12:47   [place]  Result Date: 09/27/2020 CLINICAL DATA:  ATN EXAM: RENAL / URINARY TRACT ULTRASOUND COMPLETE COMPARISON:  None. FINDINGS: Right Kidney: Renal measurements: 1.3 x 6.8 x 5.7 cm = volume: 232 mL. Diffusely increased echogenicity. No hydronephrosis. Left Kidney: Renal measurements: 10.7 x 5.2 x 4.8 cm = volume: 140 mL. Diffusely increased echogenicity. No hydronephrosis. Bladder: Bladder is completely decompressed and is not visualized. Other: None. IMPRESSION: 1. Increased echogenicity of bilateral kidneys as can be seen in medical renal disease. No hydronephrosis. Electronically Signed   By: SValentino SaxonMD   On: 09/27/2020 14:54   [place] Img Lower Bilateral (DVT)  Addendum  Date: 09/25/2020   ADDENDUM REPORT: 09/25/2020 07:27 ADDENDUM: Study discussed by telephone  with Dr. Jenell Milliner in the ICU on 09/25/2020 at 0724 hours. Electronically Signed   By: Genevie Ann M.D.   On: 09/25/2020 07:27   Result Date: 09/25/2020 CLINICAL DATA:  60 year old female with COVID-19. Bilateral lower extremity edema. EXAM: BILATERAL LOWER EXTREMITY VENOUS DOPPLER ULTRASOUND TECHNIQUE: Gray-scale sonography with graded compression, as well as color Doppler and duplex ultrasound were performed to evaluate the lower extremity deep venous systems from the level of the common femoral vein and including the common femoral, femoral, profunda femoral, popliteal and calf veins including the posterior tibial, peroneal and gastrocnemius veins when visible. The superficial great saphenous vein was also interrogated. Spectral Doppler was utilized to evaluate flow at rest and with distal augmentation maneuvers in the common femoral, femoral and popliteal veins. COMPARISON:  No prior ultrasound. FINDINGS: RIGHT LOWER EXTREMITY Common Femoral Vein: No evidence of thrombus. Normal compressibility, respiratory phasicity and response to augmentation. Saphenofemoral Junction: No evidence of thrombus. Normal compressibility and flow on color Doppler imaging. Profunda Femoral Vein: No evidence of thrombus. Normal compressibility and flow on color Doppler imaging. Femoral Vein: No evidence of thrombus. Normal compressibility, respiratory phasicity and response to augmentation. Popliteal Vein: The proximal popliteal vein appears compressible and patent (image 6), but there is echogenic thrombus in non compressible distal popliteal vein (image 9) and continuing into the calf veins. See image 27. Calf Veins: Thrombus.  See image 31. Other Findings:  None. LEFT LOWER EXTREMITY Common Femoral Vein: Echogenic thrombus in noncompressible vessel (image 32). Saphenofemoral Junction: Appears to remain patent (image 37). Profunda Femoral Vein:  Appears to remain patent (image 41). Femoral Vein: Thrombus in the proximal and mid left femoral vein. The distal femoral vein appears to remain patent. Popliteal Vein: Incompressible with echogenic thrombus (images 57-59). Thrombus extends into the calf veins (image 61). Calf Veins: Thrombus.  See image 64. Other Findings:  None. IMPRESSION: Positive for BILATERAL lower extremity DVT, more extensive on the left. Electronically Signed: By: Genevie Ann M.D. On: 09/25/2020 06:59   DG Chest Port 1 View  Result Date: 10/18/2020 CLINICAL DATA:  Acute respiratory failure EXAM: PORTABLE CHEST 1 VIEW COMPARISON:  Chest x-rays dated 10/12/2020, 10/10/2020 and 10/04/2020. Chest CT dated 09/25/2020. FINDINGS: Endotracheal tube has been removed. Tracheostomy has been placed with tip appropriately positioned in the midline. Enteric tube passes below the diaphragm. LEFT IJ central catheter has been removed. RIGHT-sided PICC line is now in place with tip adequately positioned at the level of the mid SVC. Improved aeration at the RIGHT lung base. Patchy/diffuse bilateral airspace opacities are otherwise not significantly changed compared to multiple prior exams. No new lung findings. No pleural effusion or pneumothorax is seen. IMPRESSION: 1. Tracheostomy has been placed with tip appropriately positioned in the midline. 2. RIGHT-sided PICC line appears adequately positioned with tip at the level of the mid SVC. 3. Improved aeration at the RIGHT lung base. Patchy/diffuse bilateral airspace opacities are otherwise not significantly changed, consistent with persistent multifocal pneumonia and/or developing postinflammatory fibrosis. 4. No pneumothorax. Electronically Signed   By: Franki Cabot M.D.   On: 10/18/2020 07:21   DG Chest Port 1 View  Result Date: 10/12/2020 CLINICAL DATA:  Acute respiratory failure. EXAM: PORTABLE CHEST 1 VIEW COMPARISON:  10/10/2020 FINDINGS: 0534 hours. Endotracheal tube tip is 4.3 cm above the base of  the carina. Left IJ central line tip is at the innominate vein confluence. Probable esophageal temperature probe noted. The NG tube passes into the stomach although the distal tip position is  not included on the film. Interval clear progression of near confluent airspace disease at the right base. Underlying diffuse interstitial lung opacity is similar to prior. Telemetry leads overlie the chest. IMPRESSION: 1. Interval progression of near confluent airspace disease at the right base. 2. Otherwise no substantial change. Electronically Signed   By: Misty Stanley M.D.   On: 10/12/2020 06:14   DG Chest Port 1 View  Result Date: 10/10/2020 CLINICAL DATA:  Acute respiratory failure with hypoxia EXAM: PORTABLE CHEST 1 VIEW COMPARISON:  10/04/2020 chest radiograph. FINDINGS: Endotracheal tube tip is 5.2 cm above the carina. Enteric tube enters stomach with the tip not seen on this image. Left internal jugular central venous catheter terminates in the upper third of the SVC. Stable cardiomediastinal silhouette with normal heart size. No pneumothorax. No pleural effusion. Patchy opacities throughout the left greater than right lungs, similar to minimally improved. IMPRESSION: 1. Well-positioned support structures. 2. Patchy opacities throughout the left greater than right lungs, similar to minimally improved, compatible with multilobar pneumonia. Electronically Signed   By: Ilona Sorrel M.D.   On: 10/10/2020 07:36   DG Chest Port 1 View  Result Date: 10/04/2020 CLINICAL DATA:  Acute respiratory failure EXAM: PORTABLE CHEST 1 VIEW COMPARISON:  Two days ago FINDINGS: Endotracheal tube with tip halfway between the clavicular heads and carina. Left dialysis catheter with tip at the SVC origin. Enteric tube which at least reaches the stomach. Confluent bilateral airspace disease. The density appears increased. No visible effusion or pneumothorax. Normal heart size for technique. IMPRESSION: 1. Stable hardware positioning.  2. Multifocal pneumonia with progressive density from 2 days ago. Electronically Signed   By: Monte Fantasia M.D.   On: 10/04/2020 04:19   DG Chest Port 1 View  Result Date: 10/02/2020 CLINICAL DATA:  Hypoxia. EXAM: PORTABLE CHEST 1 VIEW COMPARISON:  September 29, 2020 FINDINGS: There is stable endotracheal tube, nasogastric tube and left internal jugular venous catheter positioning. Mild to moderate severity multifocal infiltrates are seen throughout the periphery of both lungs. This is increased in severity when compared to the prior study. There is no evidence of a pleural effusion or pneumothorax. The heart size and mediastinal contours are within normal limits. Chronic left-sided rib fractures are seen. IMPRESSION: Mild to moderate severity bilateral multifocal infiltrates, increased in severity when compared to the prior study. Electronically Signed   By: Virgina Norfolk M.D.   On: 10/02/2020 02:08   DG Chest Port 1 View  Result Date: 09/29/2020 CLINICAL DATA:  Hypoxia. Central catheter placement. COVID-19 positive. EXAM: PORTABLE CHEST 1 VIEW COMPARISON:  September 28, 2020 FINDINGS: Central catheter tip is in the superior vena cava. Endotracheal tube tip is 4.7 cm above the carina. Nasogastric tube tip and side port are in the stomach. No pneumothorax. There is mild atelectasis in the right mid lung. Lungs elsewhere clear. Heart size and pulmonary vascularity are normal. Healed fractures of the left sixth and seventh ribs noted. IMPRESSION: Tube and catheter positions as described without pneumothorax. Slight right midlung atelectasis. Lungs elsewhere clear. Cardiac silhouette normal. Electronically Signed   By: Lowella Grip III M.D.   On: 09/29/2020 13:46   DG Chest Port 1 View  Result Date: 09/28/2020 CLINICAL DATA:  Respiratory failure. EXAM: PORTABLE CHEST 1 VIEW COMPARISON:  09/27/2020. FINDINGS: Endotracheal tube and NG tube in stable position. Heart size normal. Diffuse bilateral  interstitial prominence again noted without interim change. Tiny bilateral pleural effusions can not be excluded. No pneumothorax. IMPRESSION: 1. Lines and  tubes in stable position. 2. Diffuse bilateral interstitial prominence again noted without interim change. Tiny bilateral pleural effusions can not be excluded. Electronically Signed   By: Marcello Moores  Register   On: 09/28/2020 06:45   DG Chest Port 1 View  Result Date: 09/27/2020 CLINICAL DATA:  ARDS. EXAM: PORTABLE CHEST 1 VIEW COMPARISON:  September 27, 2020 FINDINGS: The endotracheal tube terminates above the carina. The enteric tube extends below the left hemidiaphragm. The heart size is stable. There are bilateral pleural effusions and bilateral airspace opacities, similar to prior study. There is no pneumothorax. There are old healed left-sided rib fractures. IMPRESSION: 1. Lines and tubes as above. 2. No significant interval change. Electronically Signed   By: Constance Holster M.D.   On: 09/27/2020 21:04   DG Chest Port 1 View  Result Date: 09/27/2020 CLINICAL DATA:  Acute respiratory failure.  History of hypertension. EXAM: PORTABLE CHEST 1 VIEW COMPARISON:  September 26, 2020 FINDINGS: The ETT is in good position. The NG tube terminates below today's film. No pneumothorax. Bibasilar infiltrates are mildly more prominent the interval. No other interval changes. IMPRESSION: 1. Support apparatus as above. 2. Bibasilar infiltrates, mildly more prominent in the interval. Electronically Signed   By: Dorise Bullion III M.D   On: 09/27/2020 09:05   DG Chest Port 1 View  Result Date: 09/26/2020 CLINICAL DATA:  ETT EXAM: PORTABLE CHEST 1 VIEW COMPARISON:  September 25, 2020 FINDINGS: The ETT is in good position. The OG tube terminates below today's film. No pneumothorax. Bilateral pulmonary infiltrates persist, improved in the interval. The cardiomediastinal silhouette is stable. IMPRESSION: 1. Support apparatus as above. 2. Infiltrate remains in the  right base, improved in the interval. Electronically Signed   By: Dorise Bullion III M.D   On: 09/26/2020 08:29   DG Chest Port 1 View  Result Date: 09/25/2020 CLINICAL DATA:  3-year-old female with COVID-19 and bilateral lower extremity DVT. Intubated. EXAM: PORTABLE CHEST 1 VIEW COMPARISON:  Portable chest 0340 hours today and earlier. FINDINGS: Portable AP upright view at 0654 hours. Endotracheal tube tip in good position just below the clavicles. Enteric tube courses to the abdomen with side hole visible at the gastric body. Stable lung volumes. Mediastinal contours remain within normal limits. Hazy and confluent left lung and right lower lung opacity. No pneumothorax or pleural effusion. The left lung opacity has progressed since 09/27/2020. Negative visible bowel gas pattern. IMPRESSION: 1. Satisfactory ET tube and enteric tube. 2. Progressed left lung opacity since 10/08/2020. Stable patchy right lower lobe opacity. No pneumothorax or pleural effusion identified. Electronically Signed   By: Genevie Ann M.D.   On: 09/25/2020 07:29   DG Chest Port 1 View  Result Date: 09/25/2020 CLINICAL DATA:  Acute respiratory failure EXAM: PORTABLE CHEST 1 VIEW COMPARISON:  09/25/2020 FINDINGS: Endotracheal to is 4 cm above the carina. NG tube is in the stomach. Patchy bilateral airspace disease, right greater than left, similar to prior study. Heart is normal size. No effusions or pneumothorax. IMPRESSION: Support devices in expected position as above. Bilateral airspace disease, not significantly changed since prior study. Electronically Signed   By: Rolm Baptise M.D.   On: 09/25/2020 03:54   DG Chest Port 1 View  Result Date: 10/05/2020 CLINICAL DATA:  COVID-19 EXAM: PORTABLE CHEST 1 VIEW COMPARISON:  01/10/2011 FINDINGS: Multifocal consolidation, worst in the lower right lung. No pneumothorax or sizable pleural effusion. Normal cardiomediastinal contours. IMPRESSION: Multifocal pneumonia. Electronically  Signed   By: Cletus Gash.D.  On: 09/25/2020 20:15   ECHOCARDIOGRAM COMPLETE  Result Date: 09/26/2020    ECHOCARDIOGRAM REPORT   Patient Name:   HADESSAH GRENNAN [facility] Date of Exam: 09/26/2020 Medical Rec #:  [id number]           Height:       64.0 in Accession #:    [id number]          Weight:       142.2 lb Date of Birth:  07-02-1961            BSA:          1.692 m Patient Age:    25 years            BP:           85/61 mmHg Patient Gender: F                   HR:           87 bpm. Exam Location:  [facility] Procedure: 2D Echo Indications:     Acute Respiratory Distress  History:         Patient has no prior history of Echocardiogram examinations.                  Risk Factors:Hypertension.  Sonographer:     Wallace Keller Thornton-Maynard Referring Phys:  [id number] Awilda Bill Diagnosing Phys: Kate Sable MD IMPRESSIONS  1. Left ventricular ejection fraction, by estimation, is 55 to 60%. The left ventricle has normal function. The left ventricle has no regional wall motion abnormalities. Left ventricular diastolic parameters are indeterminate.  2. Right ventricular systolic function is low normal. The right ventricular size is mildly enlarged. There is normal pulmonary artery systolic pressure.  3. Right atrial size was moderately dilated.  4. The mitral valve is normal in structure. No evidence of mitral valve regurgitation.  5. The aortic valve is grossly normal. Aortic valve regurgitation is not visualized. FINDINGS  Left Ventricle: Left ventricular ejection fraction, by estimation, is 55 to 60%. The left ventricle has normal function. The left ventricle has no regional wall motion abnormalities. The left ventricular internal cavity size was normal in size. There is  no left ventricular hypertrophy. Left ventricular diastolic parameters are indeterminate. Right Ventricle: The right ventricular size is mildly enlarged. No increase in right ventricular wall thickness. Right ventricular systolic function is low  normal. There is normal pulmonary artery systolic pressure. The tricuspid regurgitant velocity is 2.49 m/s, and with an assumed right atrial pressure of 3 mmHg, the estimated right ventricular systolic pressure is 31.1 mmHg. Left Atrium: Left atrial size was normal in size. Right Atrium: Right atrial size was moderately dilated. Pericardium: There is no evidence of pericardial effusion. Mitral Valve: The mitral valve is normal in structure. No evidence of mitral valve regurgitation. Tricuspid Valve: The tricuspid valve is normal in structure. Tricuspid valve regurgitation is mild. Aortic Valve: The aortic valve is grossly normal. Aortic valve regurgitation is not visualized. Aortic valve peak gradient measures 7.3 mmHg. Pulmonic Valve: The pulmonic valve was not well visualized. Pulmonic valve regurgitation is not visualized. Aorta: The aortic root is normal in size and structure. Venous: IVC assessment for right atrial pressure unable to be performed due to mechanical ventilation. IAS/Shunts: No atrial level shunt detected by color flow Doppler.  LEFT VENTRICLE PLAX 2D LVIDd:         4.28 cm  Diastology LVIDs:         3.30 cm  LV e' medial:    5.66 cm/s LV PW:         0.98 cm  LV E/e' medial:  10.7 LV IVS:        1.03 cm  LV e' lateral:   7.40 cm/s LVOT diam:     1.80 cm  LV E/e' lateral: 8.2 LV SV:         39 LV SV Index:   23 LVOT Area:     2.54 cm  RIGHT VENTRICLE RV Basal diam:  4.18 cm RV S prime:     7.29 cm/s TAPSE (M-mode): 1.2 cm LEFT ATRIUM             Index       RIGHT ATRIUM           Index LA diam:        2.70 cm 1.60 cm/m  RA Area:     23.30 cm LA Vol (A2C):   42.7 ml 25.23 ml/m RA Volume:   80.80 ml  47.74 ml/m LA Vol (A4C):   26.8 ml 15.84 ml/m LA Biplane Vol: 33.5 ml 19.79 ml/m  AORTIC VALVE                PULMONIC VALVE AV Area (Vmax): 1.49 cm    PV Vmax:       0.72 m/s AV Vmax:        135.00 cm/s PV Peak grad:  2.1 mmHg AV Peak Grad:   7.3 mmHg LVOT Vmax:      79.00 cm/s LVOT Vmean:      54.600 cm/s LVOT VTI:       0.153 m  AORTA Ao Root diam: 3.00 cm MITRAL VALVE               TRICUSPID VALVE MV Area (PHT): 4.68 cm    TR Peak grad:   24.8 mmHg MV E velocity: 60.40 cm/s  TR Vmax:        249.00 cm/s MV A velocity: 63.40 cm/s MV E/A ratio:  0.95        SHUNTS                            Systemic VTI:  0.15 m                            Systemic Diam: 1.80 cm Kate Sable MD Electronically signed by Kate Sable MD Signature Date/Time: 09/26/2020/11:49:11 AM    Final    [place] EKG SITE RITE  Result Date: 11/06/2020 If Site Rite image not attached, placement could not be confirmed due to current cardiac rhythm.  [place] EKG SITE RITE  Result Date: 09/25/2020 If Site Rite image not attached, placement could not be confirmed due to current cardiac rhythm.   Microbiology Recent Results (from the past 240 hour(s))  CULTURE, BLOOD (ROUTINE X 2) w Reflex to ID Panel     Status: None   Collection Time: 10/12/20  9:46 AM   Specimen: BLOOD RIGHT HAND  Result Value Ref Range Status   Specimen Description BLOOD RIGHT HAND  Final   Special Requests   Final    BOTTLES DRAWN AEROBIC AND ANAEROBIC Blood Culture adequate volume   Culture   Final    NO GROWTH 5 DAYS Performed at [facility], [address] [place], [place] [address]    Report Status 10/17/2020 FINAL  Final  CULTURE, BLOOD (ROUTINE X 2) w Reflex to ID Panel     Status: None   Collection Time: 10/12/20  9:46 AM   Specimen: BLOOD RIGHT ARM  Result Value Ref Range Status   Specimen Description BLOOD RIGHT ARM  Final   Special Requests   Final    BOTTLES DRAWN AEROBIC AND ANAEROBIC Blood Culture adequate volume   Culture   Final    NO GROWTH 5 DAYS Performed at [facility], [address], [place], [place] [address]    Report Status 10/17/2020 FINAL  Final    Lab Basic Metabolic Panel: Recent Labs  Lab 10/17/20 0605 10/18/20 0342 10/19/20 0338 10/20/20 0141 11-20-2020 0140  NA 143 144 143  137 136  K 3.7 3.8 3.9 3.9 4.1  CL 102 105 105 100 100  CO2 '31 30 29 27 27  ' GLUCOSE 122* 148* 100* 126* 177*  BUN 46* 49* 42* 36* 33*  CREATININE 1.09* 1.11* 0.90 0.95 1.08*  CALCIUM 8.3* 8.3* 8.2* 7.9* 7.6*  MG 1.7 1.6* 2.0 1.3* 2.2  PHOS 3.1 3.5  3.5 3.6  3.5 3.1 4.5   Liver Function Tests: Recent Labs  Lab 10/17/20 0605 10/18/20 0342 10/19/20 0338 10/20/20 0141 11/20/2020 0140  ALBUMIN 2.2* 2.2* 2.1* 2.2* 1.9*   No results for input(s): LIPASE, AMYLASE in the last 168 hours. No results for input(s): AMMONIA in the last 168 hours. CBC: Recent Labs  Lab 10/17/20 1552 10/18/20 0342 10/19/20 0338 10/20/20 0141 20-Nov-2020 0140  WBC 8.4 7.2 7.7 11.7* 18.5*  NEUTROABS  --   --   --   --  15.7*  HGB 7.5* 7.5* 6.9* 8.9* 7.0*  HCT 23.6* 23.9* 22.2* 27.0* 22.1*  MCV 91.1 91.2 91.4 86.8 89.8  PLT 255 269 261 345 304   Cardiac Enzymes: No results for input(s): CKTOTAL, CKMB, CKMBINDEX, TROPONINI in the last 168 hours. Sepsis Labs: Recent Labs  Lab 10/18/20 0342 10/19/20 0338 10/20/20 0141 11/20/2020 0140 20-Nov-2020 0838  WBC 7.2 7.7 11.7* 18.5*  --   LATICACIDVEN  --   --   --  1.3 2.4*    Procedures/Operations  CVL, intubation, ETT   Jeron Grahn 11/20/2020, 1:49 PM

## 2020-11-10 NOTE — Progress Notes (Signed)
Margaret Shepard called to come pick up pt's items in lockbox. She says she will be here today to get them.

## 2020-11-10 NOTE — Progress Notes (Addendum)
Join visit with Syliva Overman. Many family members present w/pastor. Chris and I simply maintained presence with family.

## 2020-11-10 DEATH — deceased

## 2021-02-15 IMAGING — DX DG CHEST 1V PORT
1 series · 1 of 1 positions shown · non-contrast
Comparison: September 29, 2020

CLINICAL DATA: Hypoxia.

EXAM:
PORTABLE CHEST 1 VIEW

[chest ap]
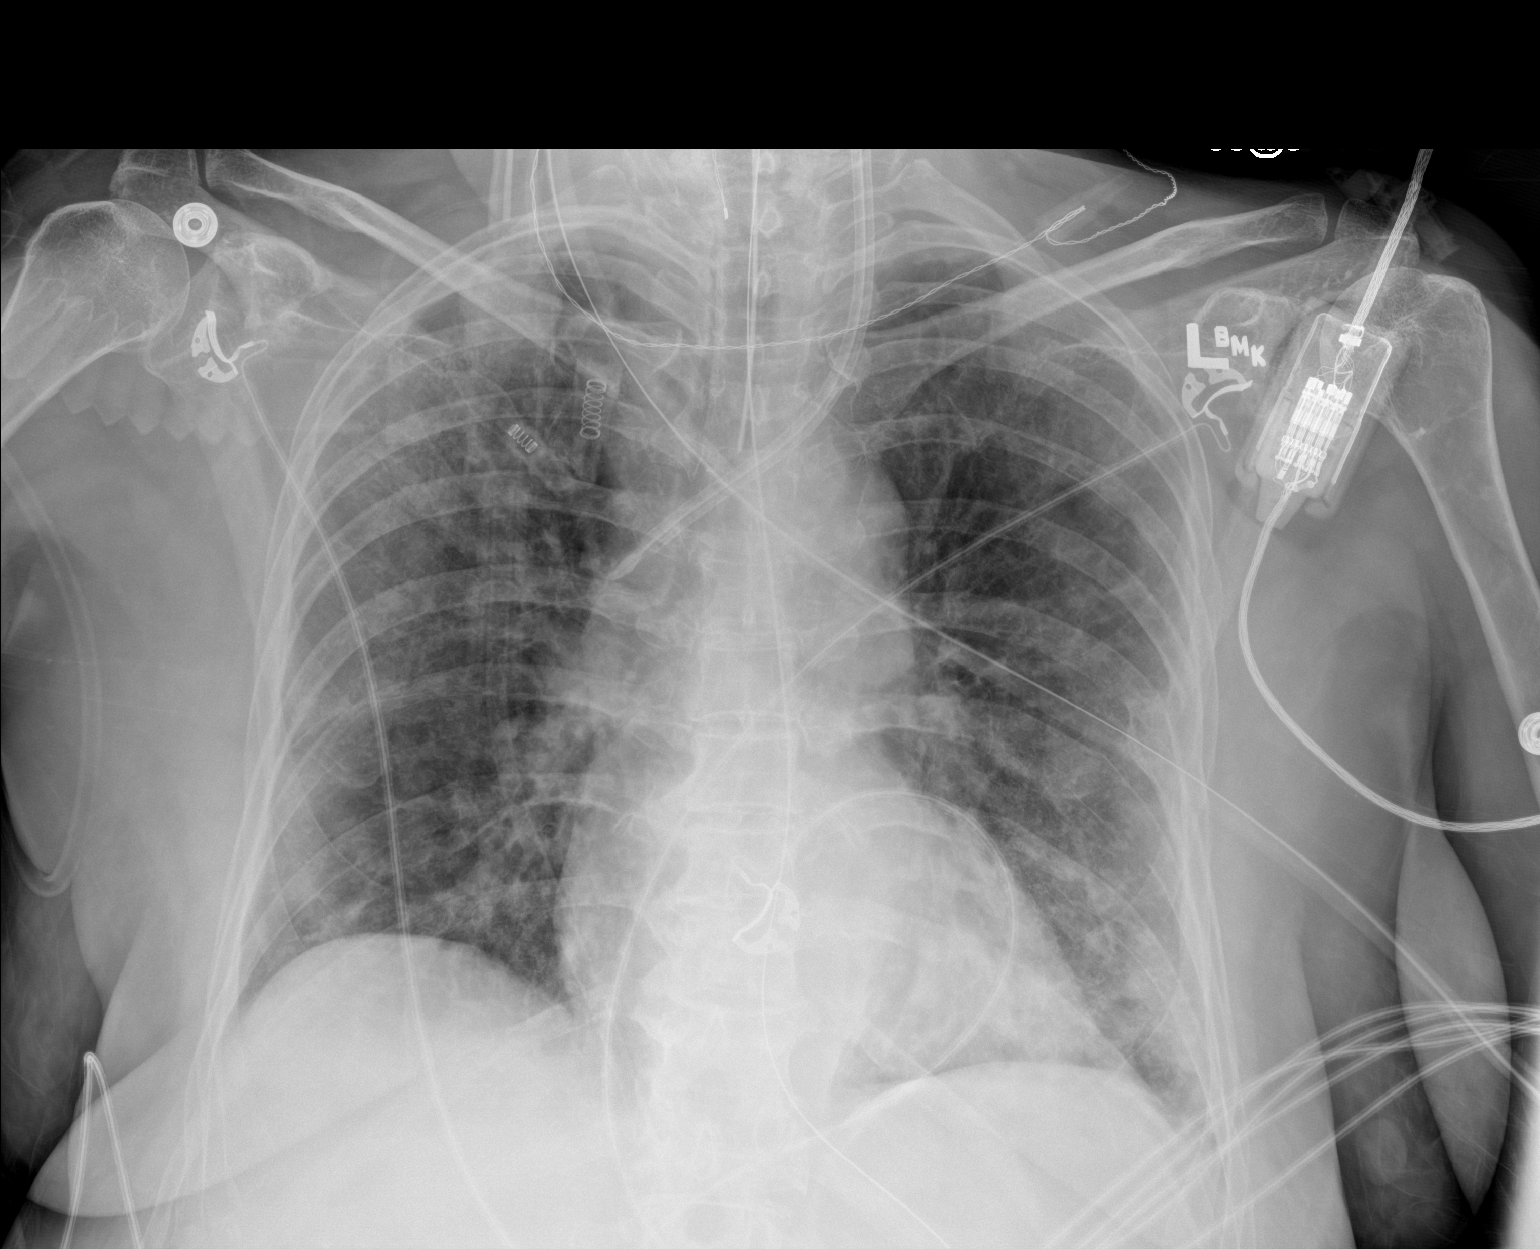

[1 of 1 positions shown; findings below may reference images not displayed]

FINDINGS: There is stable endotracheal tube, nasogastric tube and left
internal jugular venous catheter positioning. Mild to moderate
severity multifocal infiltrates are seen throughout the periphery of
both lungs. This is increased in severity when compared to the prior
study. There is no evidence of a pleural effusion or pneumothorax.
The heart size and mediastinal contours are within normal limits.
Chronic left-sided rib fractures are seen.
IMPRESSION: Mild to moderate severity bilateral multifocal infiltrates,
increased in severity when compared to the prior study.

## 2021-02-17 IMAGING — DX DG CHEST 1V PORT
1 series · 1 of 1 positions shown · non-contrast
Comparison: Two days ago

CLINICAL DATA: Acute respiratory failure

EXAM:
PORTABLE CHEST 1 VIEW

[chest ap]
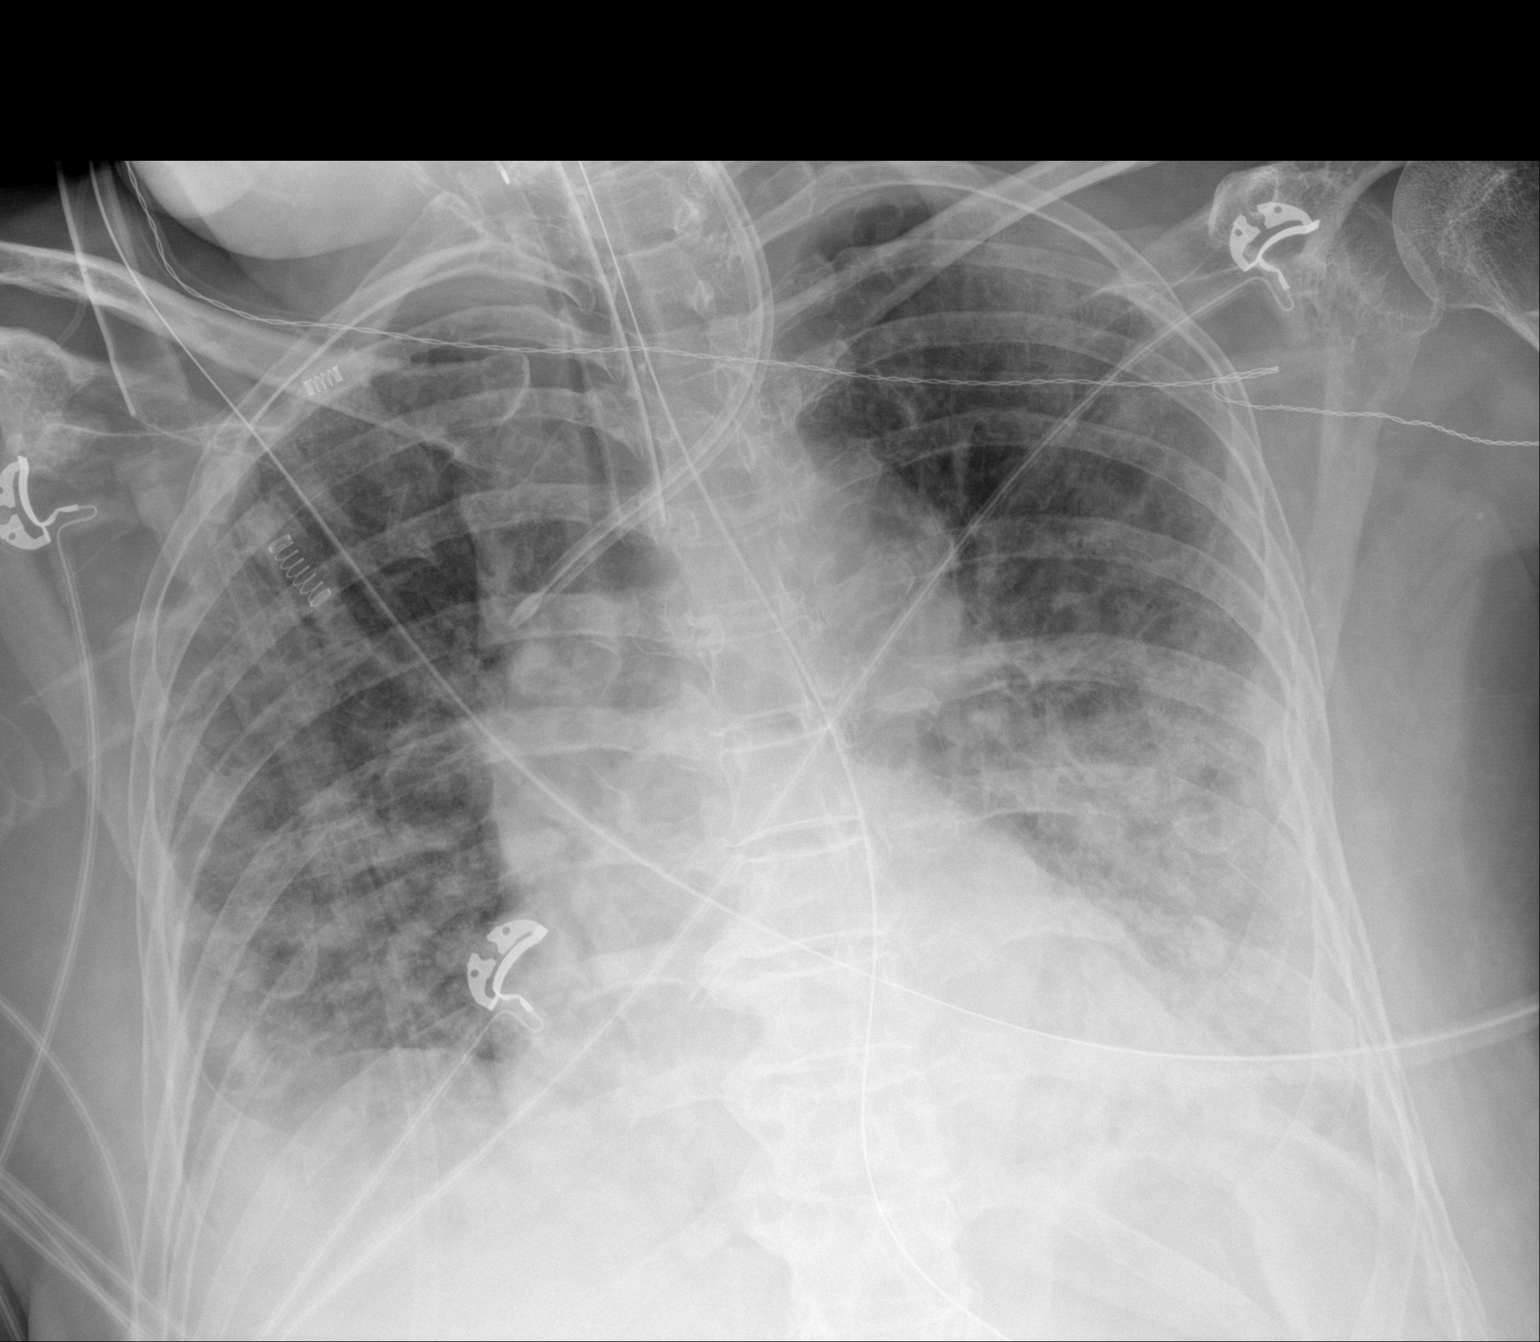

[1 of 1 positions shown; findings below may reference images not displayed]

FINDINGS: Endotracheal tube with tip halfway between the clavicular heads and
carina. Left dialysis catheter with tip at the SVC origin. Enteric
tube which at least reaches the stomach.

Confluent bilateral airspace disease. The density appears increased.
No visible effusion or pneumothorax. Normal heart size for
technique.
IMPRESSION: 1. Stable hardware positioning.
2. Multifocal pneumonia with progressive density from 2 days ago.

## 2021-02-23 IMAGING — DX DG CHEST 1V PORT
1 series · 1 of 1 positions shown · non-contrast
Comparison: 10/04/2020 chest radiograph.

CLINICAL DATA: Acute respiratory failure with hypoxia

EXAM:
PORTABLE CHEST 1 VIEW

[chest ap]
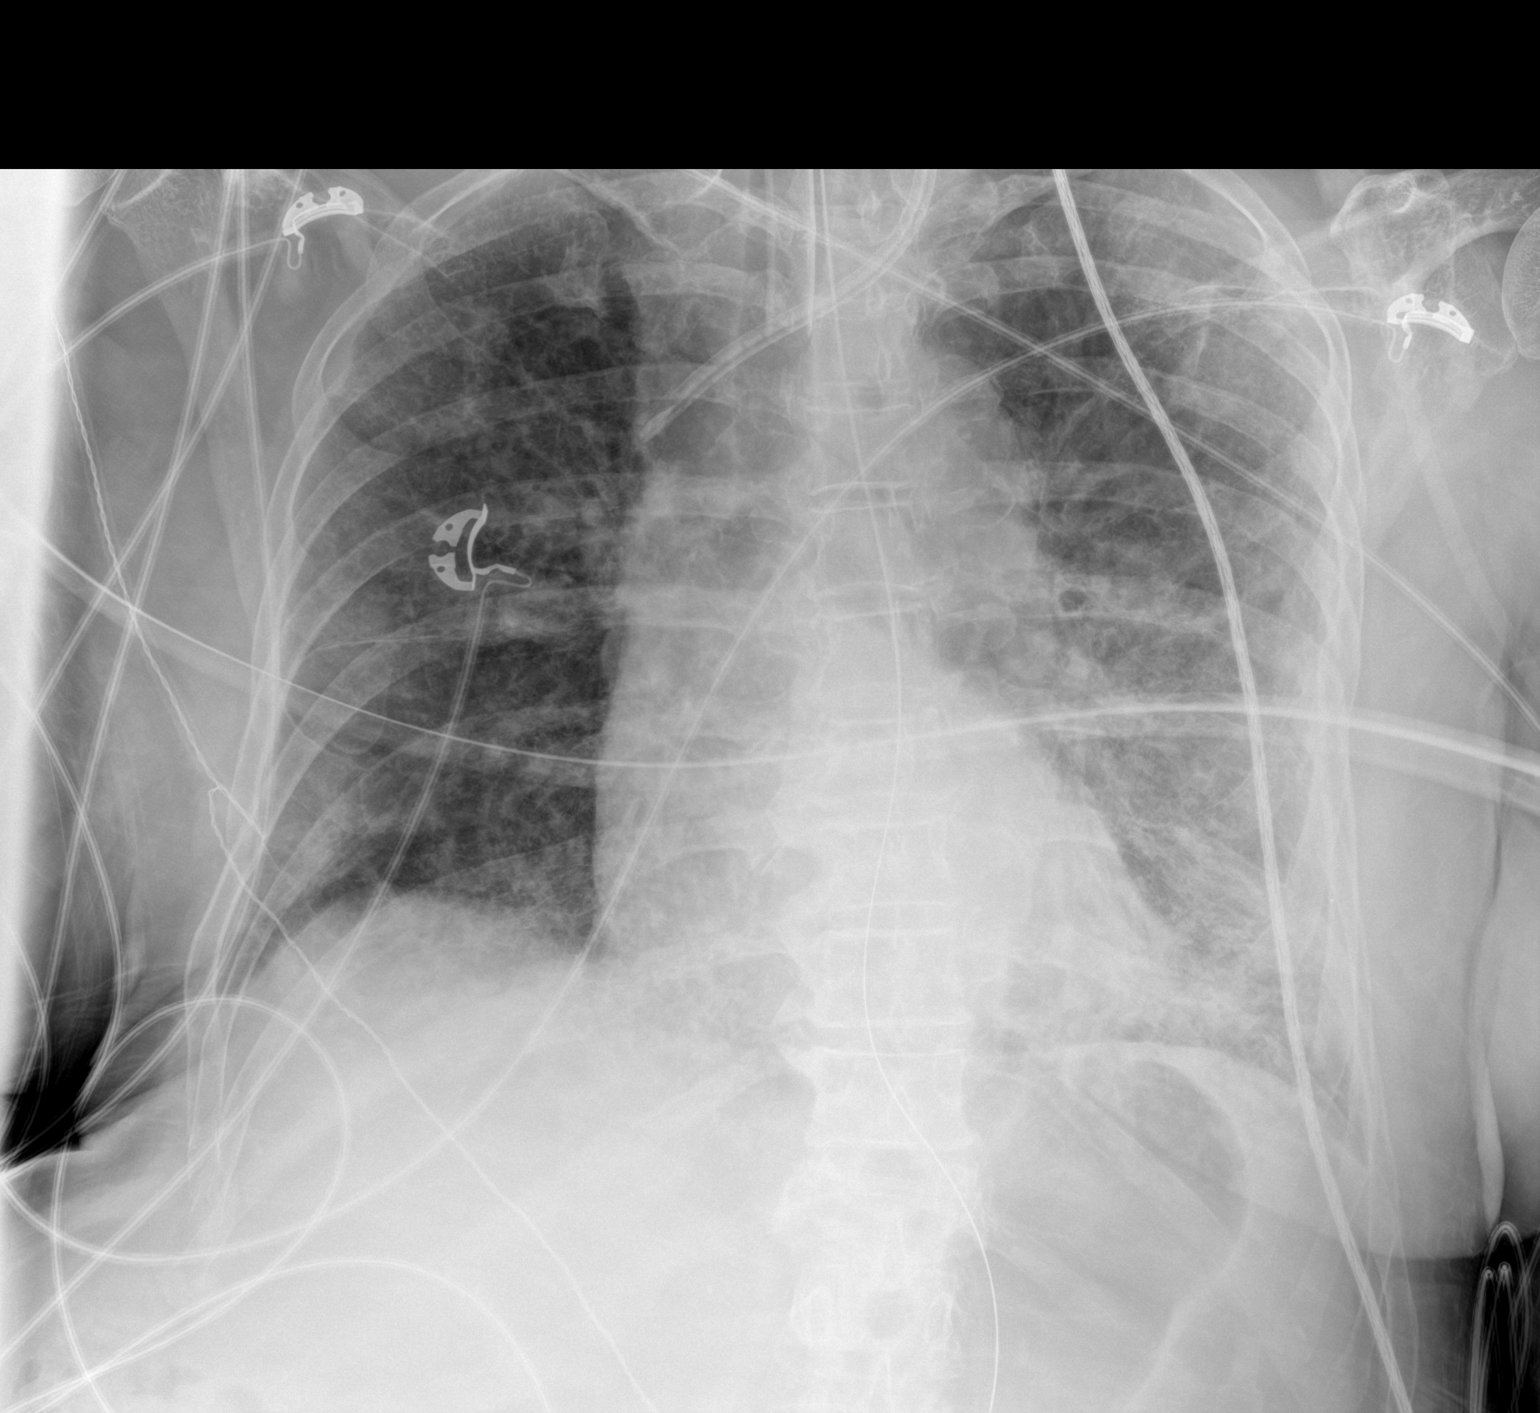

[1 of 1 positions shown; findings below may reference images not displayed]

FINDINGS: Endotracheal tube tip is 5.2 cm above the carina. Enteric tube
enters stomach with the tip not seen on this image. Left internal
jugular central venous catheter terminates in the upper third of the
SVC. Stable cardiomediastinal silhouette with normal heart size. No
pneumothorax. No pleural effusion. Patchy opacities throughout the
left greater than right lungs, similar to minimally improved.
IMPRESSION: 1. Well-positioned support structures.
2. Patchy opacities throughout the left greater than right lungs,
similar to minimally improved, compatible with multilobar pneumonia.
